# Patient Record
Sex: Female | Born: 1980
Health system: Southern US, Community
[De-identification: ages and names within clinical notes are randomized; demographics above are authoritative.]

## PROBLEM LIST (undated history)

## (undated) DIAGNOSIS — M5030 Other cervical disc degeneration, unspecified cervical region: Secondary | ICD-10-CM

## (undated) DIAGNOSIS — G43909 Migraine, unspecified, not intractable, without status migrainosus: Secondary | ICD-10-CM

## (undated) DIAGNOSIS — F4310 Post-traumatic stress disorder, unspecified: Secondary | ICD-10-CM

## (undated) DIAGNOSIS — M19041 Primary osteoarthritis, right hand: Secondary | ICD-10-CM

## (undated) DIAGNOSIS — M19042 Primary osteoarthritis, left hand: Secondary | ICD-10-CM

## (undated) DIAGNOSIS — M069 Rheumatoid arthritis, unspecified: Secondary | ICD-10-CM

## (undated) DIAGNOSIS — F32A Depression, unspecified: Secondary | ICD-10-CM

## (undated) DIAGNOSIS — F319 Bipolar disorder, unspecified: Secondary | ICD-10-CM

## (undated) DIAGNOSIS — M19071 Primary osteoarthritis, right ankle and foot: Secondary | ICD-10-CM

## (undated) DIAGNOSIS — K219 Gastro-esophageal reflux disease without esophagitis: Secondary | ICD-10-CM

## (undated) DIAGNOSIS — M19072 Primary osteoarthritis, left ankle and foot: Secondary | ICD-10-CM

## (undated) DIAGNOSIS — I10 Essential (primary) hypertension: Secondary | ICD-10-CM

## (undated) DIAGNOSIS — F429 Obsessive-compulsive disorder, unspecified: Secondary | ICD-10-CM

## (undated) DIAGNOSIS — F419 Anxiety disorder, unspecified: Secondary | ICD-10-CM

## (undated) DIAGNOSIS — F329 Major depressive disorder, single episode, unspecified: Secondary | ICD-10-CM

## (undated) HISTORY — DX: Migraine, unspecified, not intractable, without status migrainosus: G43.909

## (undated) HISTORY — DX: Post-traumatic stress disorder, unspecified: F43.10

## (undated) HISTORY — DX: Rheumatoid arthritis, unspecified: M06.9

## (undated) HISTORY — PX: TUBAL LIGATION: SHX77

## (undated) HISTORY — DX: Primary osteoarthritis, left ankle and foot: M19.072

## (undated) HISTORY — DX: Other cervical disc degeneration, unspecified cervical region: M50.30

## (undated) HISTORY — DX: Essential (primary) hypertension: I10

## (undated) HISTORY — DX: Gastro-esophageal reflux disease without esophagitis: K21.9

## (undated) HISTORY — DX: Primary osteoarthritis, right hand: M19.041

## (undated) HISTORY — DX: Obsessive-compulsive disorder, unspecified: F42.9

## (undated) HISTORY — DX: Bipolar disorder, unspecified: F31.9

## (undated) HISTORY — DX: Primary osteoarthritis, left hand: M19.042

## (undated) HISTORY — DX: Primary osteoarthritis, right ankle and foot: M19.071

---

## 2010-03-15 ENCOUNTER — Emergency Department (HOSPITAL_COMMUNITY)
Admission: EM | Admit: 2010-03-15 | Discharge: 2010-03-15 | Payer: Self-pay | Source: Home / Self Care | Admitting: Emergency Medicine

## 2010-03-18 LAB — URINALYSIS, ROUTINE W REFLEX MICROSCOPIC
Bilirubin Urine: NEGATIVE
Ketones, ur: NEGATIVE mg/dL
Nitrite: NEGATIVE
Protein, ur: NEGATIVE mg/dL
Urobilinogen, UA: 0.2 mg/dL (ref 0.0–1.0)

## 2010-03-18 LAB — URINE MICROSCOPIC-ADD ON

## 2010-03-18 LAB — POCT PREGNANCY, URINE: Preg Test, Ur: NEGATIVE

## 2010-03-19 LAB — GC/CHLAMYDIA PROBE AMP, GENITAL: GC Probe Amp, Genital: NEGATIVE

## 2010-03-19 LAB — WET PREP, GENITAL
Trich, Wet Prep: NONE SEEN
Yeast Wet Prep HPF POC: NONE SEEN

## 2010-04-01 ENCOUNTER — Inpatient Hospital Stay (INDEPENDENT_AMBULATORY_CARE_PROVIDER_SITE_OTHER)
Admission: RE | Admit: 2010-04-01 | Discharge: 2010-04-01 | Disposition: A | Discharge status: Home / Self Care | Payer: Self-pay | Source: Ambulatory Visit | Attending: Emergency Medicine | Admitting: Emergency Medicine

## 2010-04-01 DIAGNOSIS — R51 Headache: Secondary | ICD-10-CM

## 2011-02-25 ENCOUNTER — Encounter: Payer: Self-pay | Admitting: *Deleted

## 2011-02-25 ENCOUNTER — Emergency Department (HOSPITAL_COMMUNITY)
Admission: EM | Admit: 2011-02-25 | Discharge: 2011-02-25 | Disposition: A | Discharge status: Home / Self Care | Payer: BC Managed Care – PPO | Source: Home / Self Care | Attending: Family Medicine | Admitting: Family Medicine

## 2011-02-25 ENCOUNTER — Emergency Department (INDEPENDENT_AMBULATORY_CARE_PROVIDER_SITE_OTHER): Payer: BC Managed Care – PPO

## 2011-02-25 DIAGNOSIS — S93409A Sprain of unspecified ligament of unspecified ankle, initial encounter: Secondary | ICD-10-CM

## 2011-02-25 DIAGNOSIS — S93401A Sprain of unspecified ligament of right ankle, initial encounter: Secondary | ICD-10-CM

## 2011-02-25 IMAGING — CR DG ANKLE COMPLETE 3+V*R*
3 series · 3 of 3 positions shown · non-contrast
Comparison: None.

CLINICAL DATA: Fell and injured right ankle.  Lateral pain.

RIGHT ANKLE - COMPLETE 3+ VIEW [DATE]:

[view not recorded (1 of 3)]
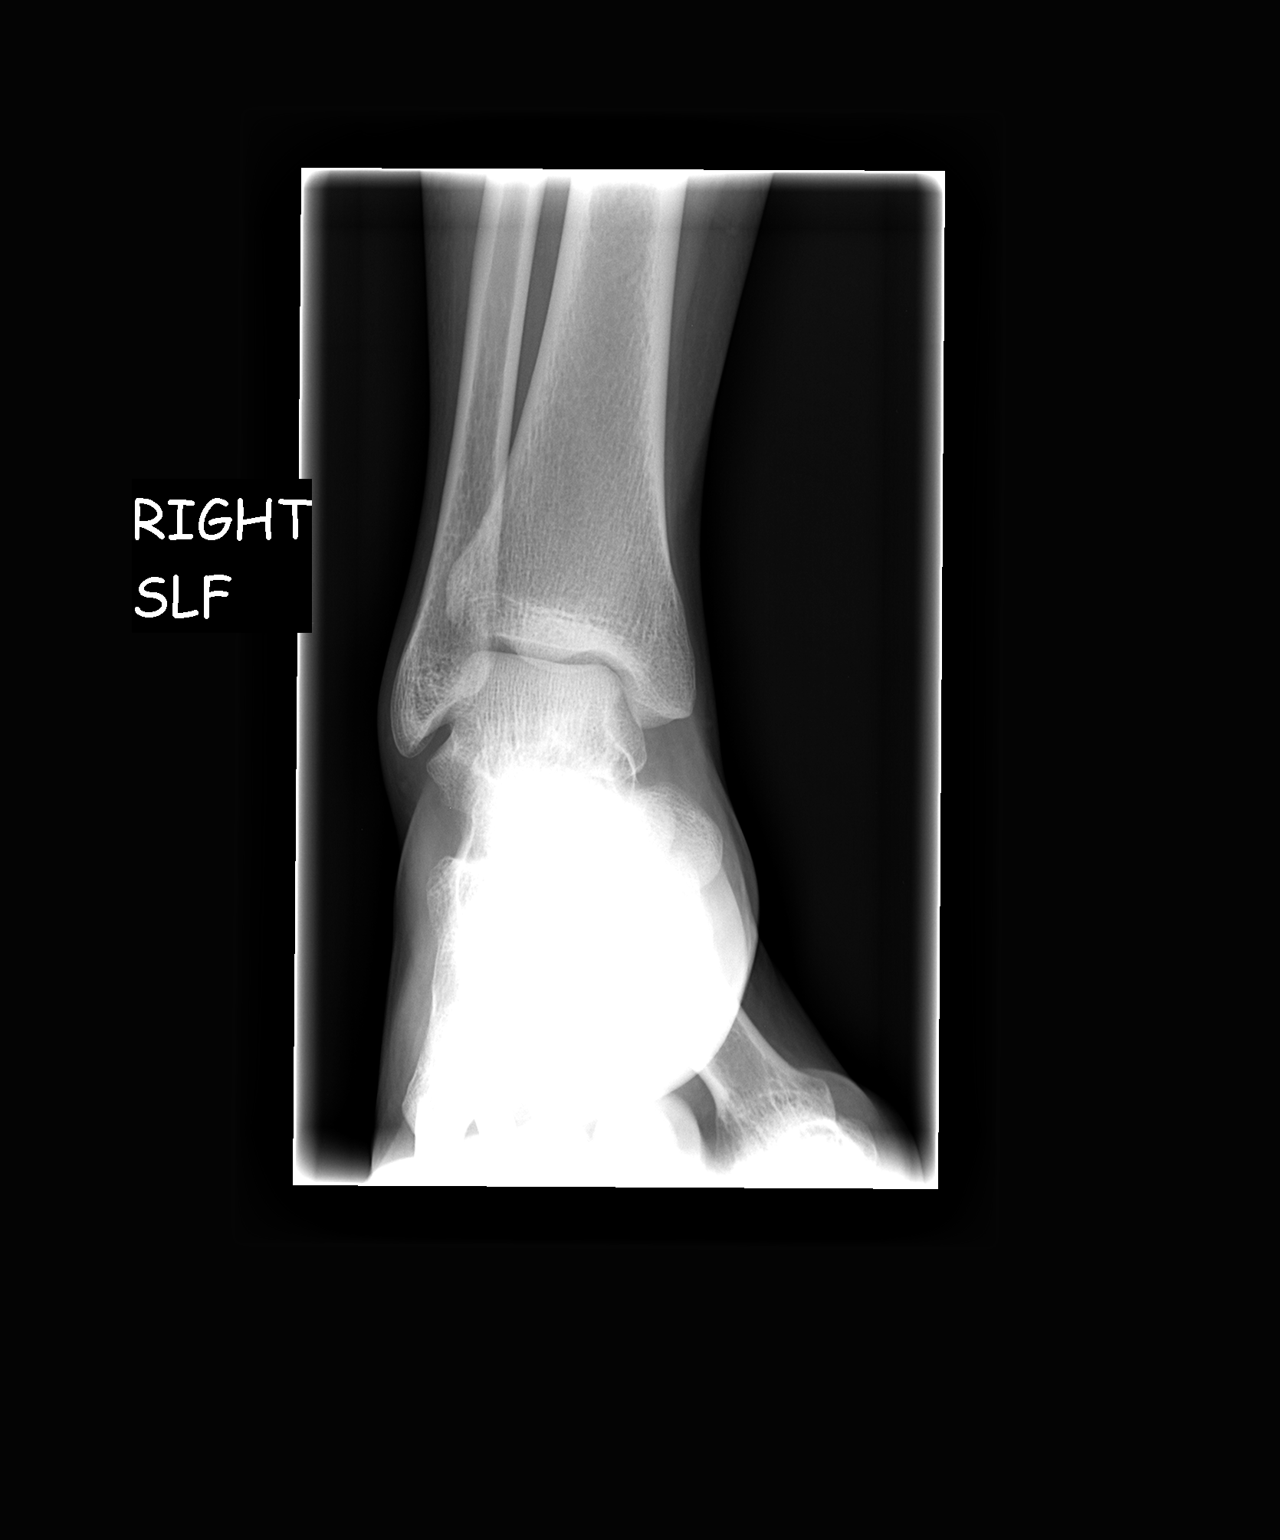

[view not recorded (2 of 3)]
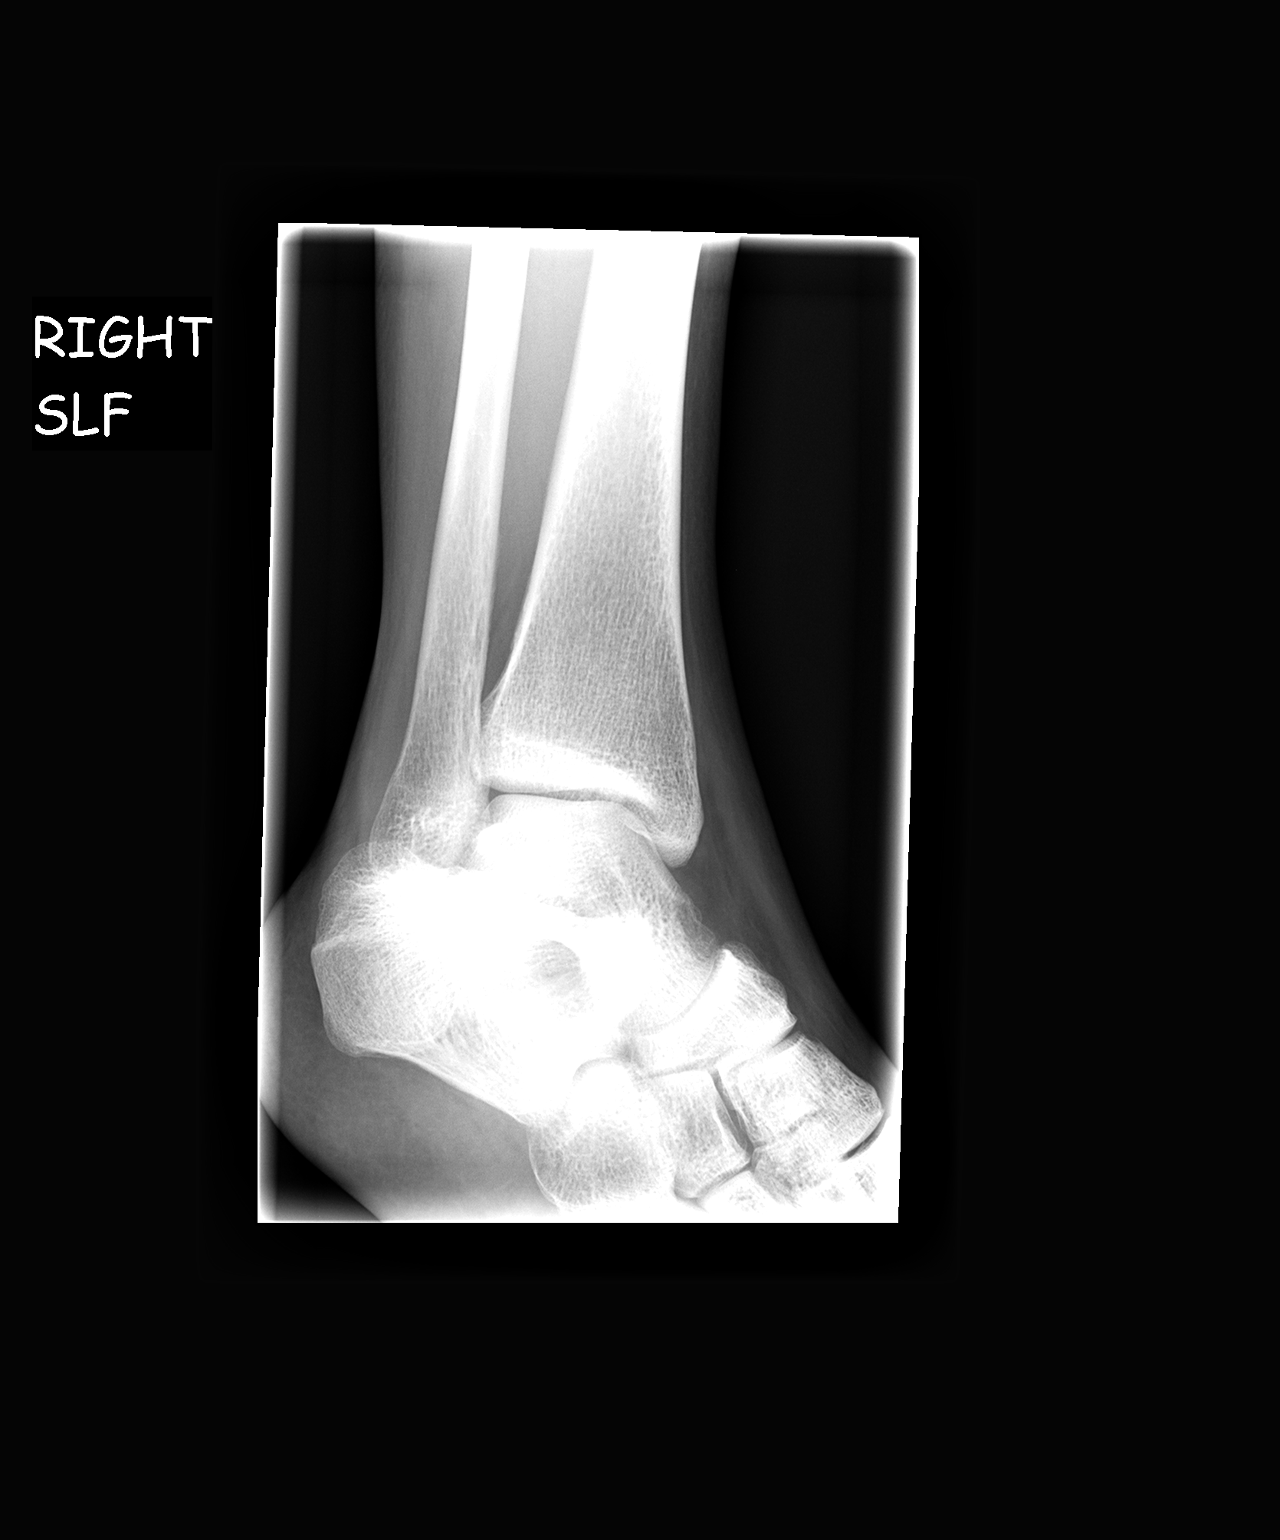

[view not recorded (3 of 3)]
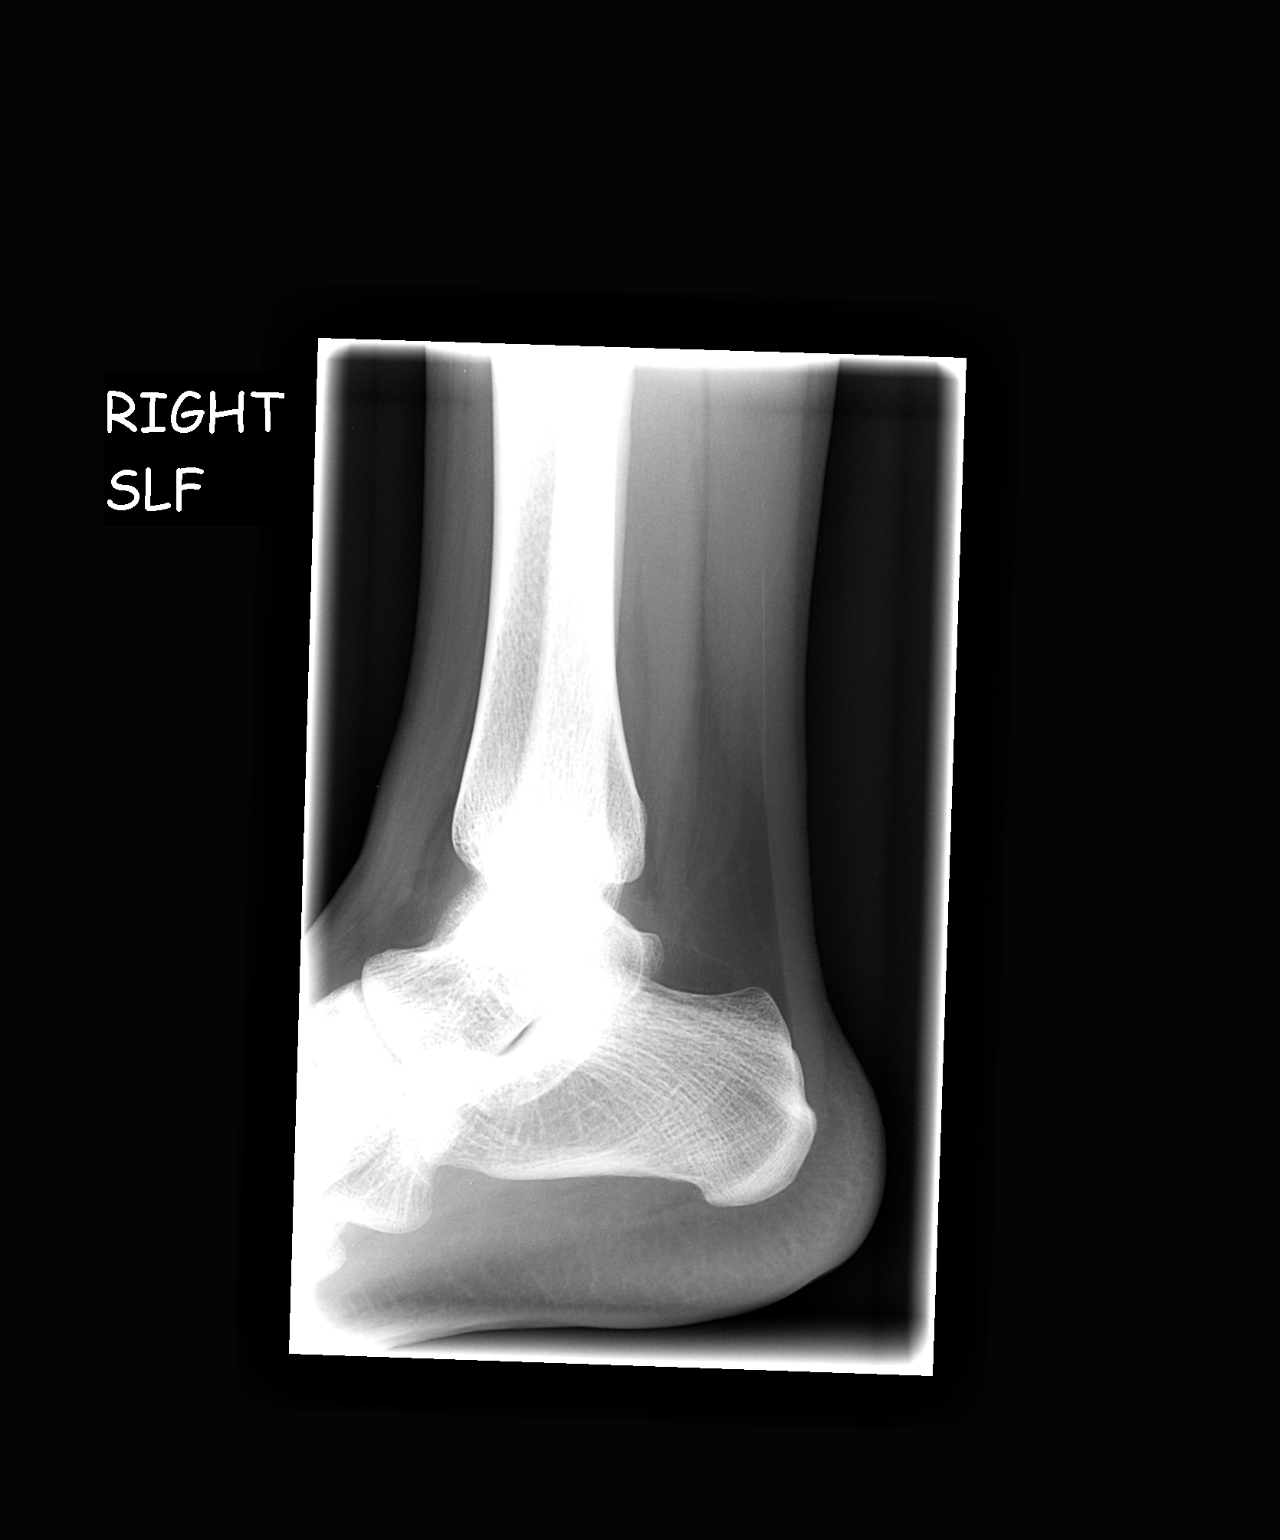

[3 of 3 positions shown; findings below may reference images not displayed]

FINDINGS: No evidence of acute fracture or dislocation.  Ankle
mortise intact with well-preserved joint space.  Well-preserved
bone mineral density.  No intrinsic osseous abnormality.  Small
joint effusion.
IMPRESSION: No osseous abnormality.

## 2011-02-25 MED ORDER — IBUPROFEN 800 MG PO TABS: 800.0000 mg | ORAL_TABLET | Freq: Three times a day (TID) | ORAL | Status: AC

## 2011-02-25 NOTE — ED Notes (Signed)
Injured right ankle last night fell - increased pain this am
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
## 2011-06-10 ENCOUNTER — Ambulatory Visit
Admission: RE | Admit: 2011-06-10 | Discharge: 2011-06-10 | Disposition: A | Discharge status: Home / Self Care | Payer: BC Managed Care – PPO | Source: Ambulatory Visit | Attending: Family Medicine | Admitting: Family Medicine

## 2011-06-10 ENCOUNTER — Other Ambulatory Visit: Payer: Self-pay | Admitting: Family Medicine

## 2011-06-10 DIAGNOSIS — R1011 Right upper quadrant pain: Secondary | ICD-10-CM

## 2011-06-10 IMAGING — US US ABDOMEN COMPLETE
1 series · 14 of 25 positions shown · non-contrast
Comparison: None.

CLINICAL DATA: Abdominal pain

ABDOMINAL ULTRASOUND COMPLETE

[Series 1: us abdomen complete · 0.23mm/px · 14 of 55 slices shown]
[im 1/55]
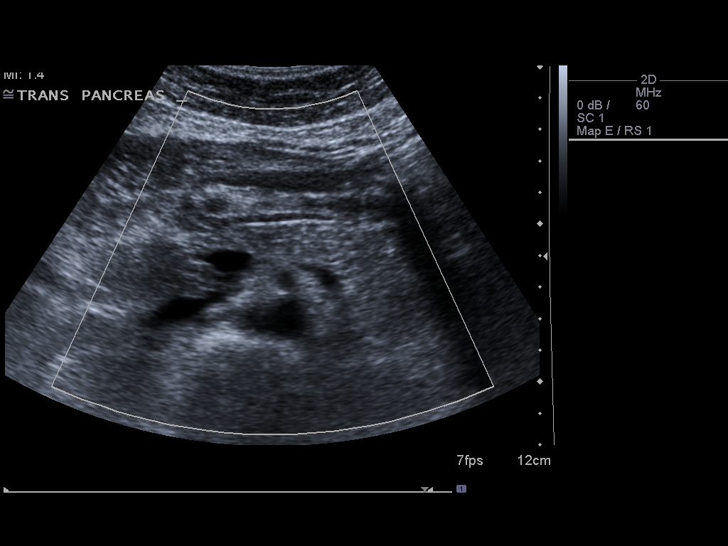
[im 5/55]
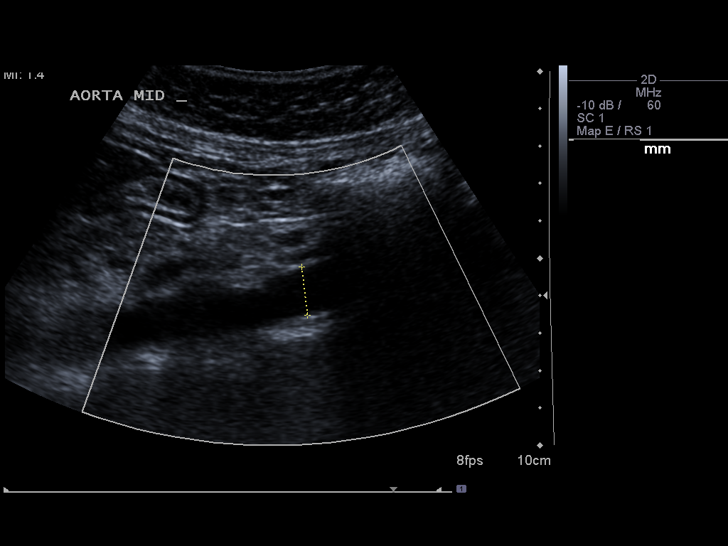
[im 10/55]
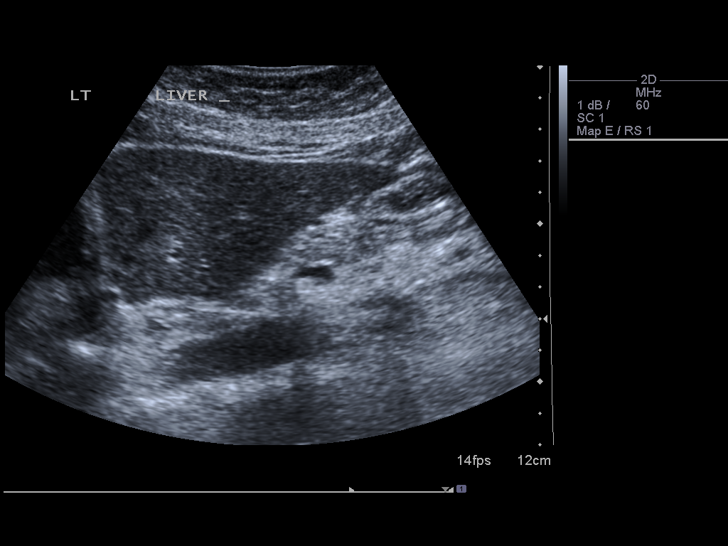
[im 14/55]
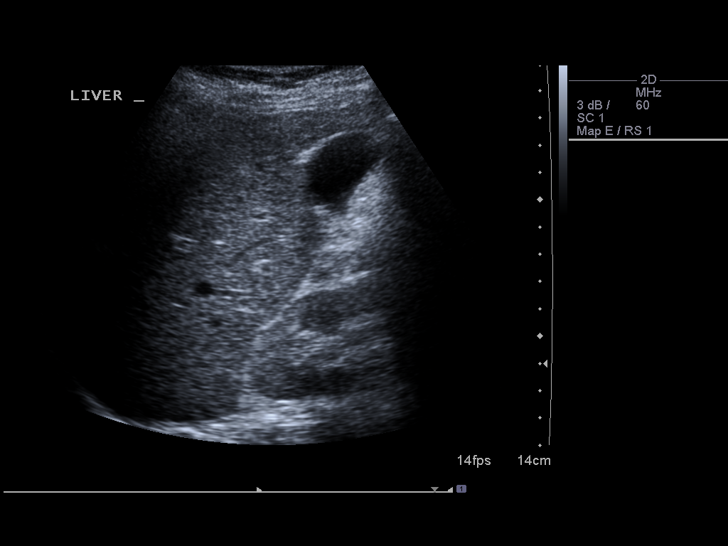
[im 19/55]
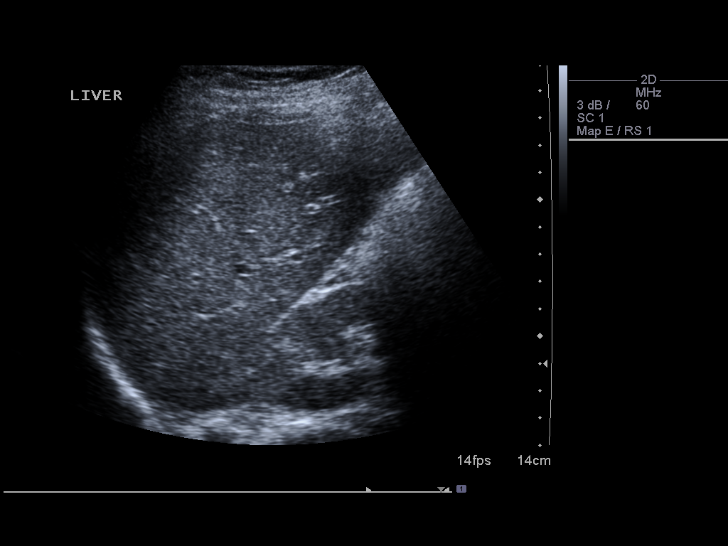
[im 21/55]
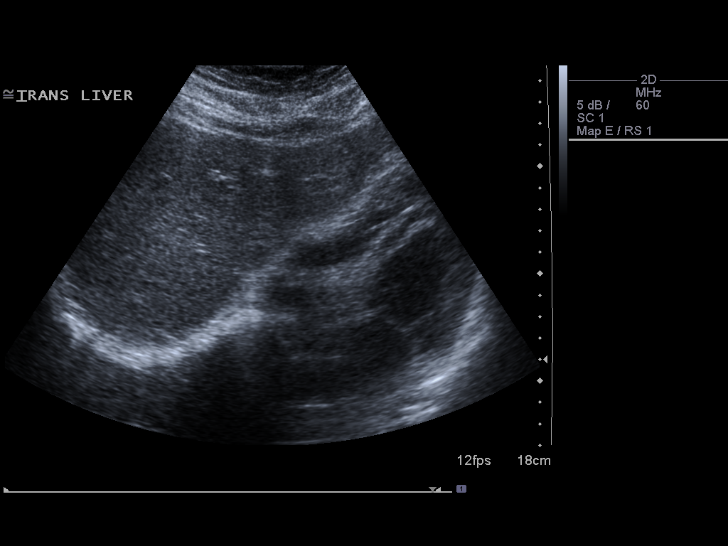
[im 25/55]
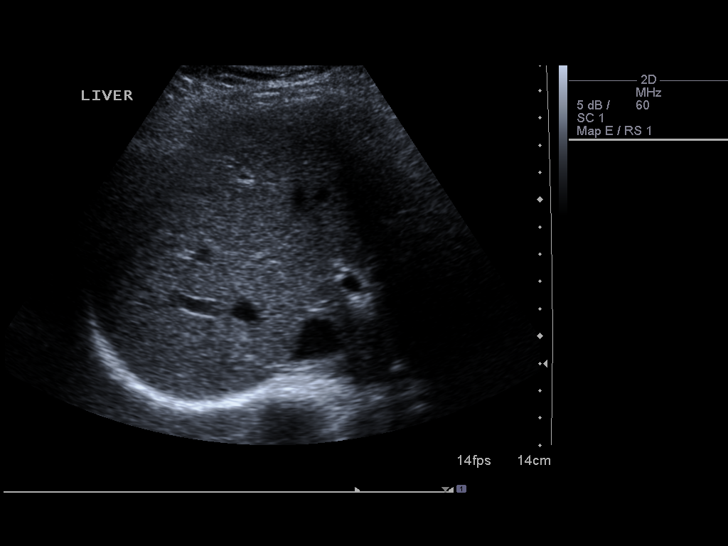
[im 30/55]
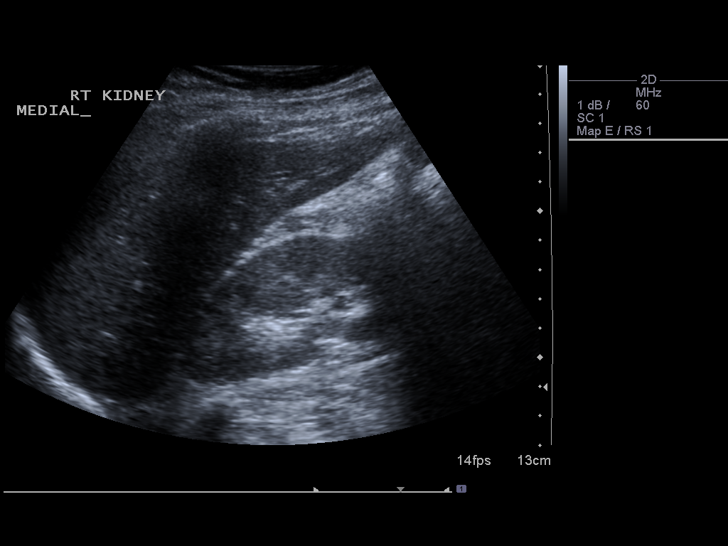
[im 34/55]
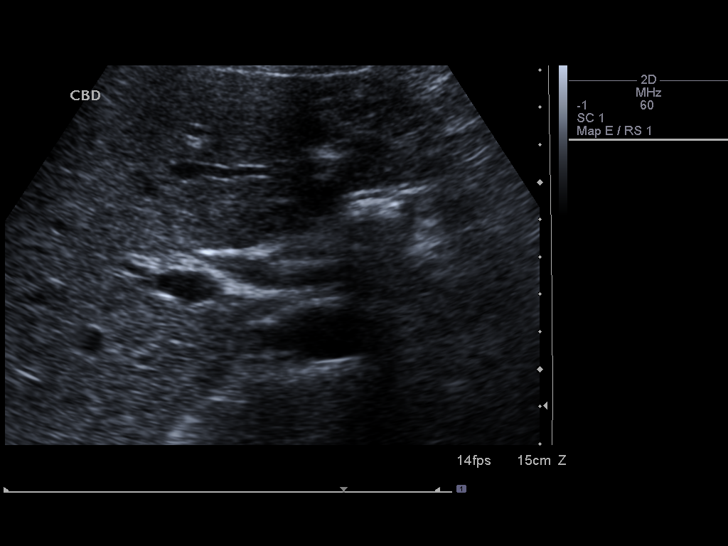
[im 37/55]
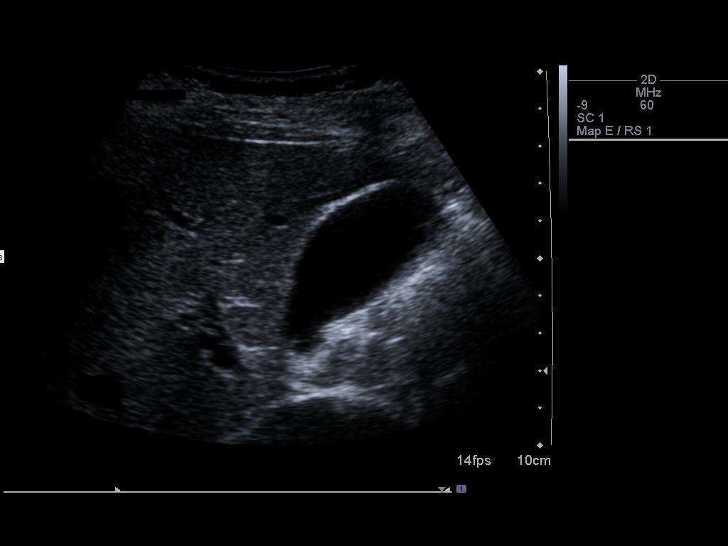
[im 41/55]
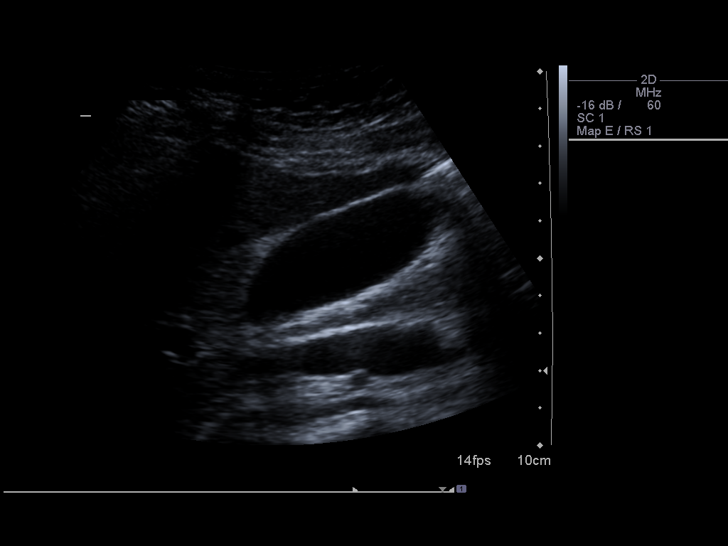
[im 46/55]
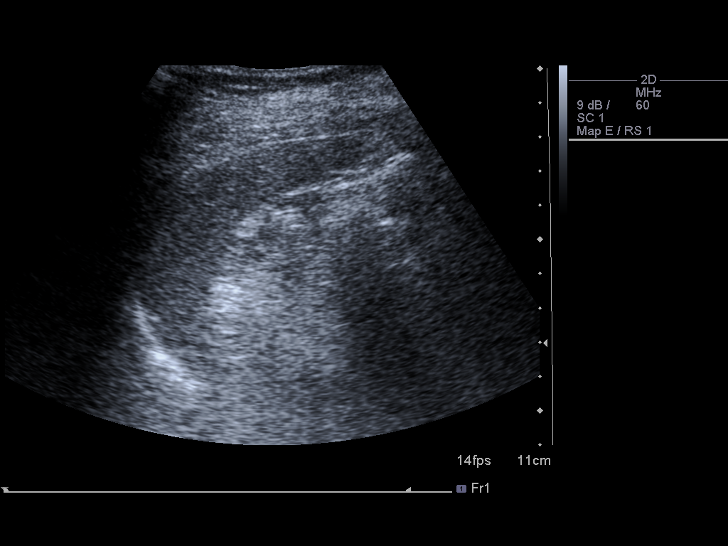
[im 50/55]
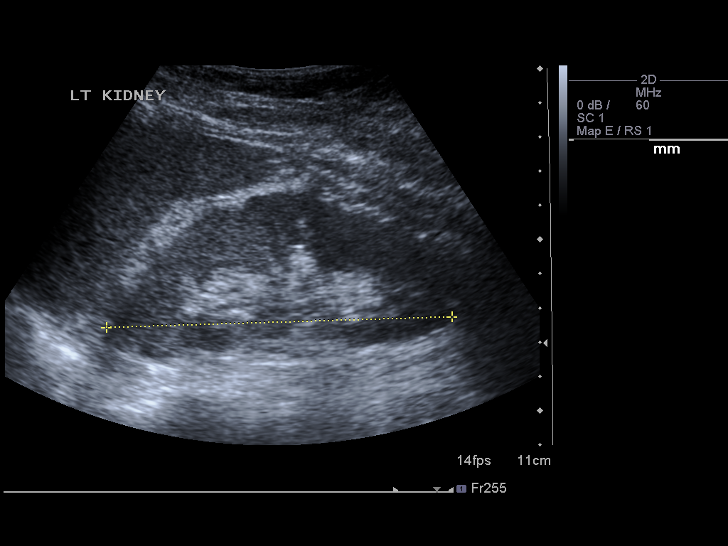
[im 55/55]
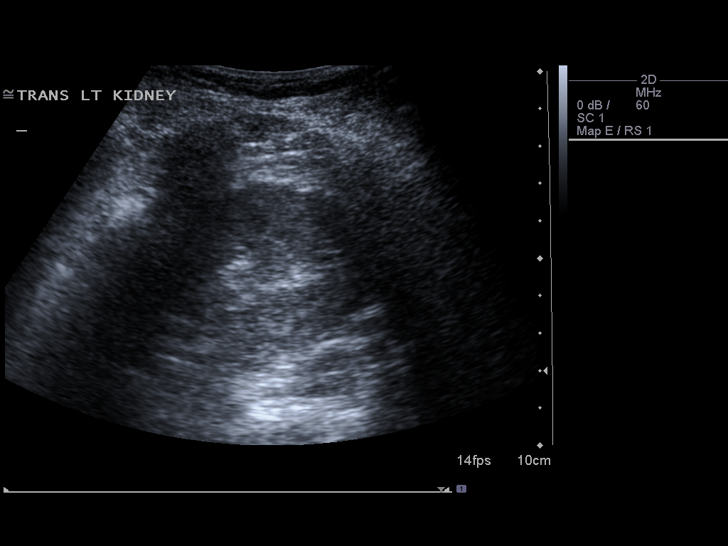

[14 of 25 positions shown; findings below may reference images not displayed]

FINDINGS: Gallbladder:  No gallstones, gallbladder wall thickening, or
pericholecystic fluid. Negative sonographic Murphy's sign.

Common Bile Duct:  Within normal limits in caliber. Measures
mm.

Liver: No focal mass lesion identified.  Within normal limits in
parenchymal echogenicity.

IVC:  Appears normal.

Pancreas:  No abnormality identified.

Spleen:  Within normal limits in size and echotexture.

Right kidney:  Normal in size and parenchymal echogenicity.  No
evidence of mass or hydronephrosis.

Left kidney:  Normal in size and parenchymal echogenicity.  No
evidence of mass or hydronephrosis.

Abdominal Aorta:  No aneurysm identified.
IMPRESSION: Negative abdominal ultrasound.

## 2011-06-11 ENCOUNTER — Other Ambulatory Visit: Payer: BC Managed Care – PPO

## 2011-06-22 ENCOUNTER — Encounter (HOSPITAL_COMMUNITY): Payer: Self-pay | Admitting: *Deleted

## 2011-06-22 ENCOUNTER — Emergency Department (HOSPITAL_COMMUNITY): Payer: BC Managed Care – PPO

## 2011-06-22 ENCOUNTER — Emergency Department (HOSPITAL_COMMUNITY)
Admission: EM | Admit: 2011-06-22 | Discharge: 2011-06-22 | Disposition: A | Discharge status: Home / Self Care | Payer: BC Managed Care – PPO | Attending: Emergency Medicine | Admitting: Emergency Medicine

## 2011-06-22 DIAGNOSIS — S0990XA Unspecified injury of head, initial encounter: Secondary | ICD-10-CM | POA: Insufficient documentation

## 2011-06-22 DIAGNOSIS — F17200 Nicotine dependence, unspecified, uncomplicated: Secondary | ICD-10-CM | POA: Insufficient documentation

## 2011-06-22 DIAGNOSIS — R51 Headache: Secondary | ICD-10-CM | POA: Insufficient documentation

## 2011-06-22 DIAGNOSIS — S0003XA Contusion of scalp, initial encounter: Secondary | ICD-10-CM | POA: Insufficient documentation

## 2011-06-22 DIAGNOSIS — S0083XA Contusion of other part of head, initial encounter: Secondary | ICD-10-CM
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
## 2011-06-22 MED ORDER — IBUPROFEN 400 MG PO TABS: 400.0000 mg | ORAL_TABLET | Freq: Three times a day (TID) | ORAL | Status: AC | PRN

## 2011-06-22 MED ORDER — DIPHENHYDRAMINE HCL 25 MG PO CAPS
25.0000 mg | ORAL_CAPSULE | Freq: Once | ORAL | Status: AC
  Administered 2011-06-22: 25 mg via ORAL
  Filled 2011-06-22: qty 1

## 2011-06-22 MED ORDER — OXYCODONE-ACETAMINOPHEN 5-325 MG PO TABS: 1.0000 | ORAL_TABLET | ORAL | Status: AC | PRN

## 2011-06-22 MED ORDER — IBUPROFEN 800 MG PO TABS
800.0000 mg | ORAL_TABLET | Freq: Once | ORAL | Status: AC
  Administered 2011-06-22: 800 mg via ORAL
  Filled 2011-06-22: qty 1

## 2011-06-22 MED ORDER — HYDROCODONE-ACETAMINOPHEN 5-325 MG PO TABS
1.0000 | ORAL_TABLET | Freq: Once | ORAL | Status: AC
  Administered 2011-06-22: 1 via ORAL
  Filled 2011-06-22: qty 1

## 2011-06-22 NOTE — ED Notes (Signed)
Patient currently sitting up in bed; no respiratory or acute distress noted.  Patient wanting to go home; informed patient that we are still waiting on x-ray results at this time.  Will continue to monitor.

## 2011-06-22 NOTE — ED Notes (Signed)
Patient given ice pack
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
## 2011-06-22 NOTE — ED Notes (Signed)
Katherine, FNP at bedside. 
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
## 2011-06-22 NOTE — ED Provider Notes (Signed)
Medical screening examination/treatment/procedure(s) were performed by non-physician practitioner and as supervising physician I was immediately available for consultation/collaboration.  Tashaun Obey M Tyia Binford, MD 06/22/11 0544 
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
## 2012-10-26 ENCOUNTER — Emergency Department (HOSPITAL_COMMUNITY)
Admission: EM | Admit: 2012-10-26 | Discharge: 2012-10-26 | Disposition: A | Discharge status: Home / Self Care | Payer: Self-pay | Source: Home / Self Care

## 2012-10-26 ENCOUNTER — Encounter (HOSPITAL_COMMUNITY): Payer: Self-pay | Admitting: Emergency Medicine

## 2012-10-26 DIAGNOSIS — M25512 Pain in left shoulder: Secondary | ICD-10-CM

## 2012-10-26 DIAGNOSIS — M25519 Pain in unspecified shoulder: Secondary | ICD-10-CM

## 2012-10-26 MED ORDER — TRAMADOL HCL 50 MG PO TABS: 50.0000 mg | ORAL_TABLET | Freq: Four times a day (QID) | ORAL | Status: DC | PRN

## 2012-10-26 MED ORDER — NAPROXEN 500 MG PO TABS: 500.0000 mg | ORAL_TABLET | Freq: Two times a day (BID) | ORAL | Status: DC

## 2012-10-26 NOTE — ED Notes (Signed)
C/o left arm pain that is sharp with tingling sensation. Denies injury. Limited ROM. Pt states that she has a hx of left shoulder problems.  Pt has used warm compresses and tylenol with no relief in pain. Onset Friday.
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
## 2012-10-28 NOTE — ED Provider Notes (Signed)
Medical screening examination/treatment/procedure(s) were performed by a resident physician or non-physician practitioner and as the supervising physician I was immediately available for consultation/collaboration.  Karma Hiney, MD   Makaveli Hoard S Gray Maugeri, MD 10/28/12 1026 

## 2012-12-17 ENCOUNTER — Emergency Department (INDEPENDENT_AMBULATORY_CARE_PROVIDER_SITE_OTHER)
Admission: EM | Admit: 2012-12-17 | Discharge: 2012-12-17 | Disposition: A | Discharge status: Home / Self Care | Payer: Self-pay | Source: Home / Self Care

## 2012-12-17 ENCOUNTER — Encounter (HOSPITAL_COMMUNITY): Payer: Self-pay | Admitting: Emergency Medicine

## 2012-12-17 DIAGNOSIS — J069 Acute upper respiratory infection, unspecified: Secondary | ICD-10-CM

## 2012-12-17 HISTORY — DX: Depression, unspecified: F32.A

## 2012-12-17 HISTORY — DX: Anxiety disorder, unspecified: F41.9

## 2012-12-17 HISTORY — DX: Major depressive disorder, single episode, unspecified: F32.9

## 2012-12-17 NOTE — ED Notes (Signed)
No work note

## 2012-12-17 NOTE — ED Provider Notes (Signed)
CSN: [id number]     Arrival date & time 12/17/12  4098 History   First MD Initiated Contact with Patient 12/17/12 1002     Chief Complaint  Patient presents with  . Nasal Congestion   (Consider location/radiation/quality/duration/timing/severity/associated sxs/prior Treatment) Patient is a 32 y.o. female presenting with URI. The history is provided by the patient.  URI Presenting symptoms: congestion and rhinorrhea   Presenting symptoms: no ear pain, no facial pain, no fatigue, no fever and no sore throat   Severity:  Mild Onset quality:  Gradual Duration:  3 days Timing:  Constant Progression:  Unchanged Chronicity:  New Relieved by:  Nothing Ineffective treatments:  OTC medications Associated symptoms: headaches, myalgias and sneezing   Associated symptoms: no arthralgias, no neck pain, no sinus pain, no swollen glands and no wheezing     Past Medical History  Diagnosis Date  . Migraine   . Depression   . Anxiety    No past surgical history on file. No family history on file. History  Substance Use Topics  . Smoking status: Current Every Day Smoker  . Smokeless tobacco: Not on file  . Alcohol Use: Yes   OB History   Grav Para Term Preterm Abortions TAB SAB Ect Mult Living                 Review of Systems  Constitutional: Negative for fever and fatigue.  HENT: Positive for congestion, rhinorrhea and sneezing. Negative for ear pain and sore throat.   Respiratory: Negative for wheezing.   Musculoskeletal: Positive for myalgias. Negative for arthralgias and neck pain.  Neurological: Positive for headaches.    Allergies  Vicodin  Home Medications   Current Outpatient Rx  Name  Route  Sig  Dispense  Refill  . naproxen (NAPROSYN) 500 MG tablet   Oral   Take 1 tablet (500 mg total) by mouth 2 (two) times daily.   60 tablet   0   . traMADol (ULTRAM) 50 MG tablet   Oral   Take 1 tablet (50 mg total) by mouth every 6 (six) hours as needed for pain.   30  tablet   0    BP 123/86  Pulse 66  Temp(Src) 98.3 F (36.8 C) (Oral)  Resp 18  SpO2 99%  LMP 12/11/2012 Physical Exam  Nursing note and vitals reviewed. Constitutional: She is oriented to person, place, and time. She appears well-developed and well-nourished. No distress.  HENT:  Right Ear: External ear normal.  Left Ear: External ear normal.  Mouth/Throat: Oropharynx is clear and moist. No oropharyngeal exudate.  Eyes: Conjunctivae and EOM are normal.  Neck: Normal range of motion. Neck supple.  Cardiovascular: Normal rate, regular rhythm and normal heart sounds.   Pulmonary/Chest: Effort normal and breath sounds normal. No respiratory distress. She has no wheezes. She has no rales.  Musculoskeletal: Normal range of motion. She exhibits no edema.  Lymphadenopathy:    She has no cervical adenopathy.  Neurological: She is alert and oriented to person, place, and time.  Skin: Skin is warm and dry. No rash noted.  Psychiatric: She has a normal mood and affect.    ED Course  Procedures (including critical care time) Labs Review Labs Reviewed - No data to display Imaging Review No results found.    MDM   1. URI (upper respiratory infection)      Alka-Seltzer plus cold medicine as directed Drink plenty of fluids Chicken soup Rest  Hayden Rasmussen, NP 12/17/12 1015

## 2012-12-17 NOTE — ED Notes (Signed)
Provided work note for patient.

## 2012-12-17 NOTE — ED Notes (Signed)
Congestion, body aches, headaches. Onset of symptoms was tuesday

## 2012-12-17 NOTE — ED Provider Notes (Signed)
Medical screening examination/treatment/procedure(s) were performed by a resident physician or non-physician practitioner and as the supervising physician I was immediately available for consultation/collaboration.  Tai Skelly, MD   Rinda Rollyson S Lyllie Cobbins, MD 12/17/12 1443 

## 2013-03-19 ENCOUNTER — Emergency Department (HOSPITAL_COMMUNITY)
Admission: EM | Admit: 2013-03-19 | Discharge: 2013-03-19 | Disposition: A | Discharge status: Home / Self Care | Payer: No Typology Code available for payment source | Attending: Emergency Medicine | Admitting: Emergency Medicine

## 2013-03-19 ENCOUNTER — Emergency Department (INDEPENDENT_AMBULATORY_CARE_PROVIDER_SITE_OTHER)
Admission: EM | Admit: 2013-03-19 | Discharge: 2013-03-19 | Disposition: A | Discharge status: Nursing Home (Includes ICF) | Payer: No Typology Code available for payment source | Source: Home / Self Care | Attending: Emergency Medicine | Admitting: Emergency Medicine

## 2013-03-19 ENCOUNTER — Emergency Department (HOSPITAL_COMMUNITY): Payer: No Typology Code available for payment source

## 2013-03-19 ENCOUNTER — Encounter (HOSPITAL_COMMUNITY): Payer: Self-pay | Admitting: Emergency Medicine

## 2013-03-19 DIAGNOSIS — F17200 Nicotine dependence, unspecified, uncomplicated: Secondary | ICD-10-CM | POA: Insufficient documentation

## 2013-03-19 DIAGNOSIS — G43909 Migraine, unspecified, not intractable, without status migrainosus: Secondary | ICD-10-CM | POA: Insufficient documentation

## 2013-03-19 DIAGNOSIS — Z3202 Encounter for pregnancy test, result negative: Secondary | ICD-10-CM | POA: Insufficient documentation

## 2013-03-19 DIAGNOSIS — Z8659 Personal history of other mental and behavioral disorders: Secondary | ICD-10-CM | POA: Insufficient documentation

## 2013-03-19 DIAGNOSIS — N23 Unspecified renal colic: Secondary | ICD-10-CM | POA: Insufficient documentation

## 2013-03-19 DIAGNOSIS — R1031 Right lower quadrant pain: Secondary | ICD-10-CM

## 2013-03-19 LAB — CBC WITH DIFFERENTIAL/PLATELET
Basophils Absolute: 0 10*3/uL (ref 0.0–0.1)
Basophils Relative: 0 % (ref 0–1)
EOS PCT: 1 % (ref 0–5)
Eosinophils Absolute: 0.1 10*3/uL (ref 0.0–0.7)
HEMATOCRIT: 35.4 % — AB (ref 36.0–46.0)
Hemoglobin: 13 g/dL (ref 12.0–15.0)
LYMPHS ABS: 1.6 10*3/uL (ref 0.7–4.0)
LYMPHS PCT: 23 % (ref 12–46)
MCH: 30.6 pg (ref 26.0–34.0)
MCHC: 36.7 g/dL — ABNORMAL HIGH (ref 30.0–36.0)
MCV: 83.3 fL (ref 78.0–100.0)
Monocytes Absolute: 0.4 10*3/uL (ref 0.1–1.0)
Monocytes Relative: 5 % (ref 3–12)
NEUTROS ABS: 5.1 10*3/uL (ref 1.7–7.7)
Neutrophils Relative %: 71 % (ref 43–77)
PLATELETS: 249 10*3/uL (ref 150–400)
RBC: 4.25 MIL/uL (ref 3.87–5.11)
RDW: 13.2 % (ref 11.5–15.5)
WBC: 7.1 10*3/uL (ref 4.0–10.5)

## 2013-03-19 LAB — COMPREHENSIVE METABOLIC PANEL
ALK PHOS: 49 U/L (ref 39–117)
ALT: 15 U/L (ref 0–35)
AST: 15 U/L (ref 0–37)
Albumin: 3.5 g/dL (ref 3.5–5.2)
BILIRUBIN TOTAL: 0.2 mg/dL — AB (ref 0.3–1.2)
BUN: 16 mg/dL (ref 6–23)
CALCIUM: 8.7 mg/dL (ref 8.4–10.5)
CHLORIDE: 109 meq/L (ref 96–112)
CO2: 20 meq/L (ref 19–32)
Creatinine, Ser: 0.9 mg/dL (ref 0.50–1.10)
GFR, EST NON AFRICAN AMERICAN: 83 mL/min — AB (ref >90)
GLUCOSE: 100 mg/dL — AB (ref 70–99)
Potassium: 4.4 mEq/L (ref 3.7–5.3)
SODIUM: 142 meq/L (ref 137–147)
Total Protein: 7.5 g/dL (ref 6.0–8.3)

## 2013-03-19 LAB — URINALYSIS, ROUTINE W REFLEX MICROSCOPIC
BILIRUBIN URINE: NEGATIVE
Glucose, UA: NEGATIVE mg/dL
KETONES UR: NEGATIVE mg/dL
Leukocytes, UA: NEGATIVE
NITRITE: NEGATIVE
PROTEIN: NEGATIVE mg/dL
SPECIFIC GRAVITY, URINE: 1.028 (ref 1.005–1.030)
UROBILINOGEN UA: 0.2 mg/dL (ref 0.0–1.0)
pH: 5 (ref 5.0–8.0)

## 2013-03-19 LAB — URINE MICROSCOPIC-ADD ON

## 2013-03-19 LAB — POCT PREGNANCY, URINE: Preg Test, Ur: NEGATIVE

## 2013-03-19 IMAGING — CT CT ABD-PELV W/O CM
2 of 4 series · 16 of 46 positions shown, 18 images · non-contrast
Comparison: Ultrasound [DATE].

CLINICAL DATA: Right flank pain.

EXAM:
CT ABDOMEN AND PELVIS WITHOUT CONTRAST
TECHNIQUE: Multidetector CT imaging of the abdomen and pelvis was performed
following the standard protocol without intravenous contrast.

[Series 2: stone study 5.0 i30f 1 · axial · 0.78mm/px · z∈[-351,+39]mm · 13 of 86 slices shown, 15 images]
[im 4/86  soft-tissue]
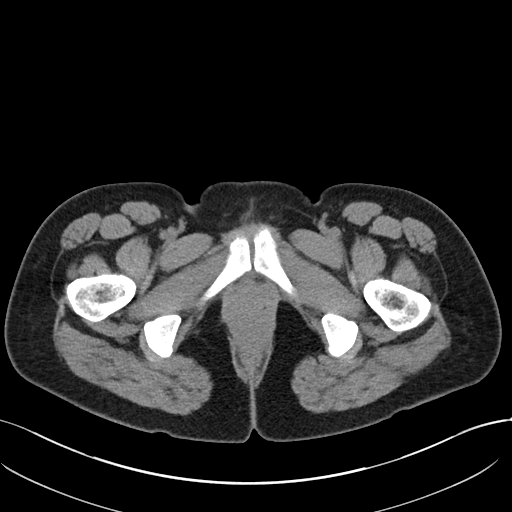
[im 4/86  bone]
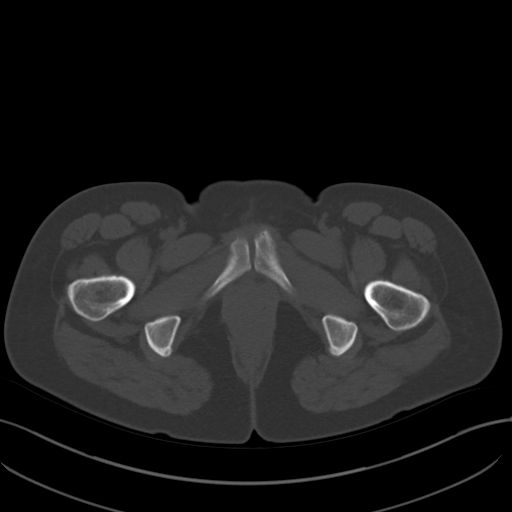
[im 11/86  soft-tissue]
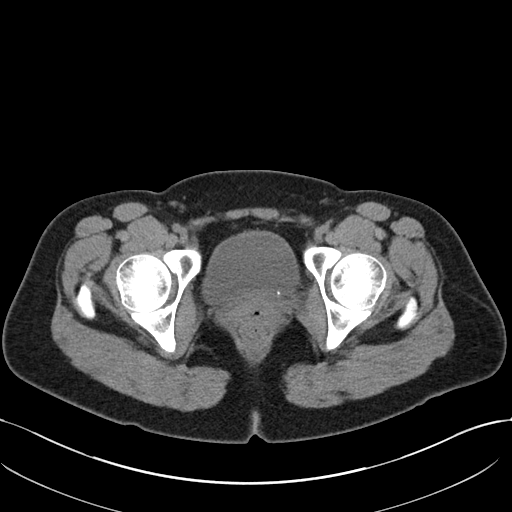
[im 18/86  soft-tissue]
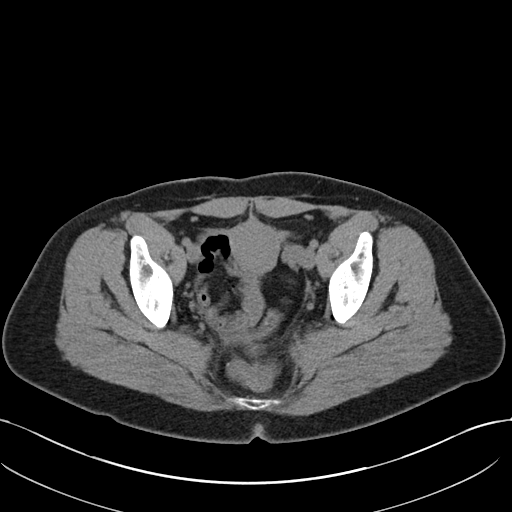
[im 24/86  soft-tissue]
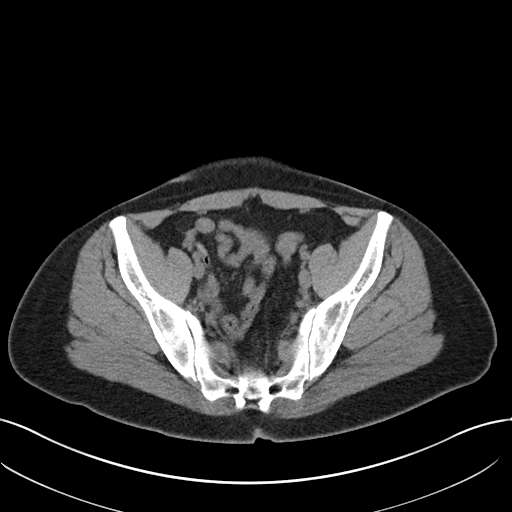
[im 31/86  soft-tissue]
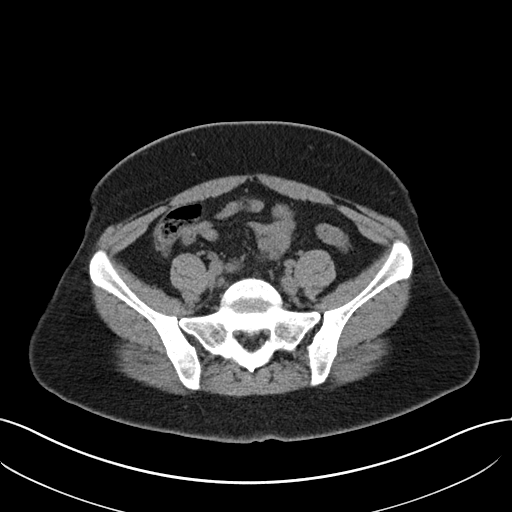
[im 38/86  soft-tissue]
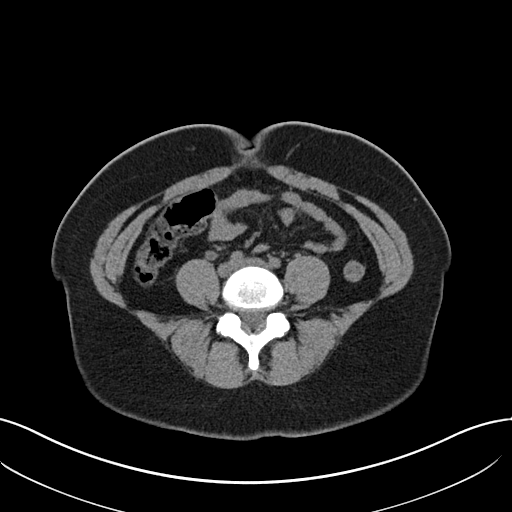
[im 45/86  soft-tissue]
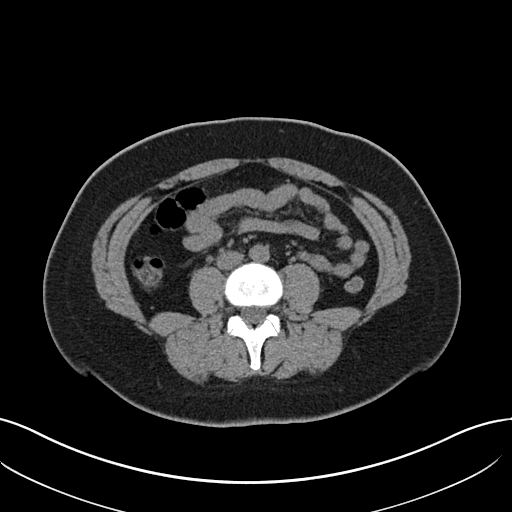
[im 48/86  soft-tissue]
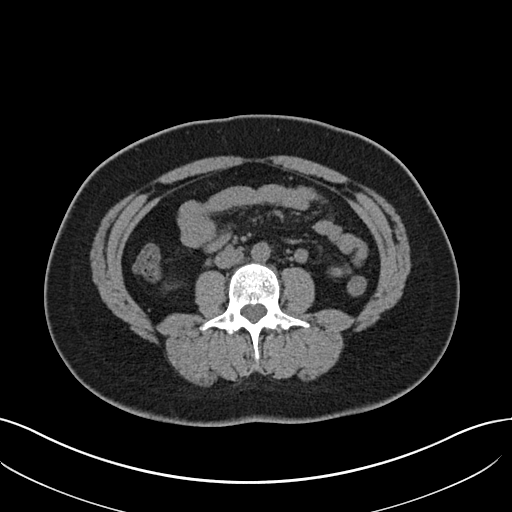
[im 55/86  soft-tissue]
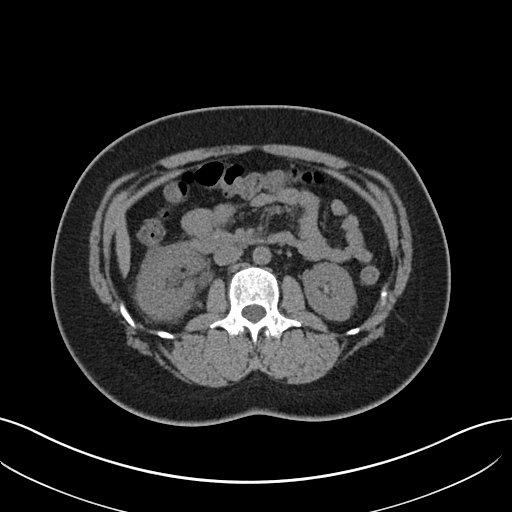
[im 55/86  bone]
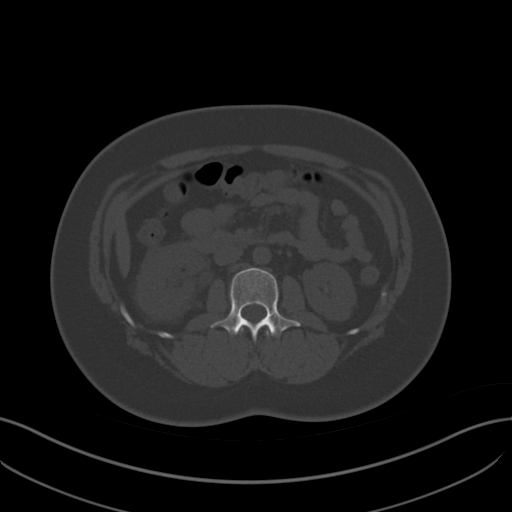
[im 62/86  soft-tissue]
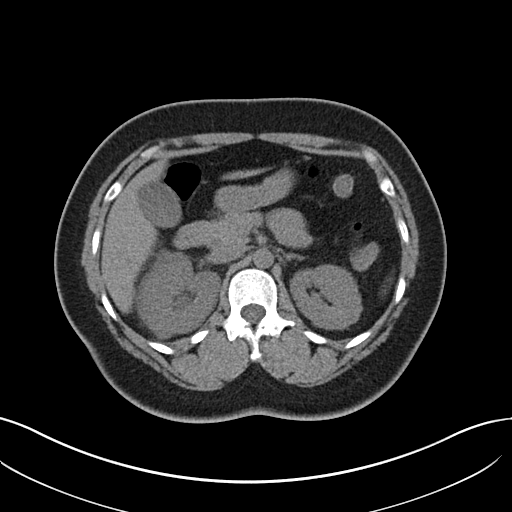
[im 69/86  soft-tissue]
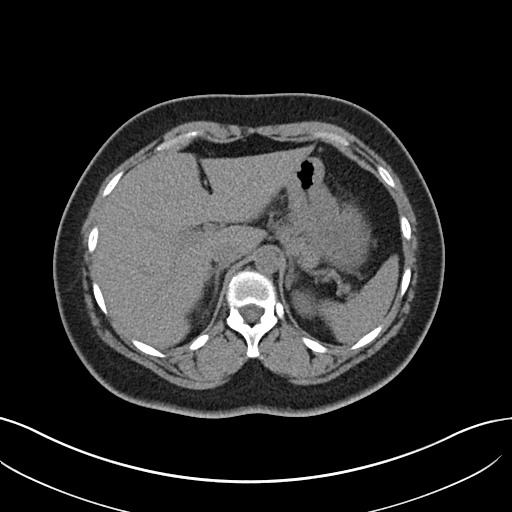
[im 75/86  soft-tissue]
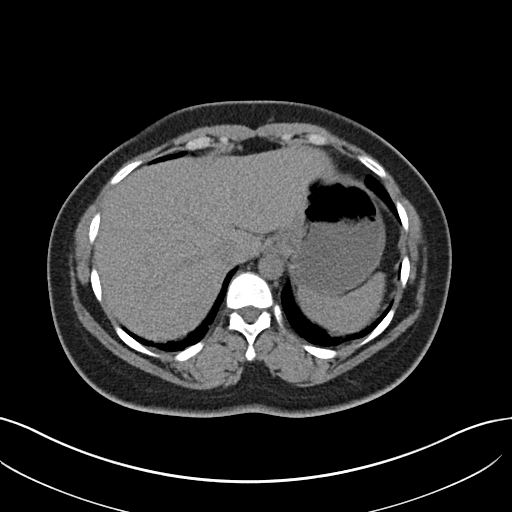
[im 82/86  soft-tissue]
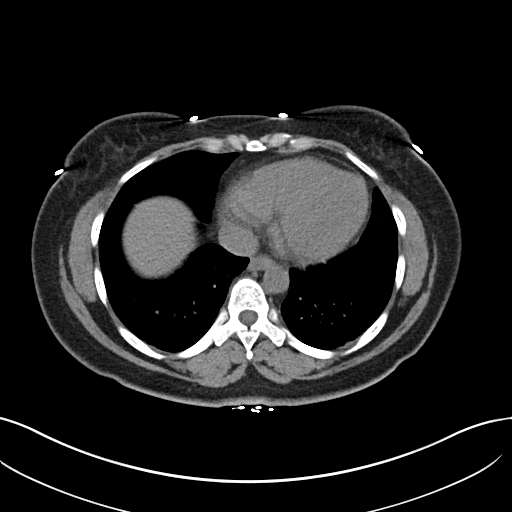

[Series 5: coronal soft tissue · coronal · 0.77mm/px · 3 of 76 slices shown]
[im 26/76  soft-tissue]
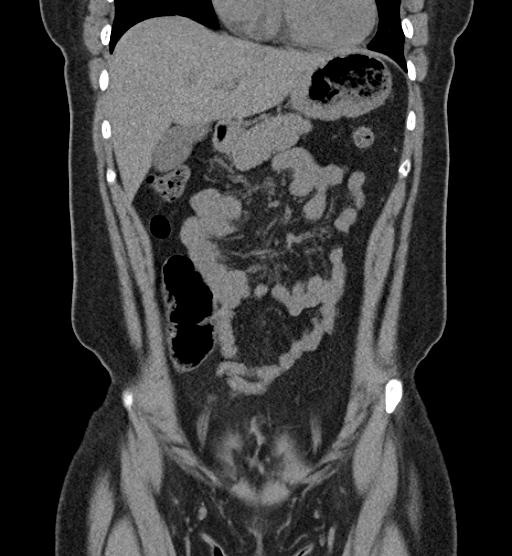
[im 34/76  soft-tissue]
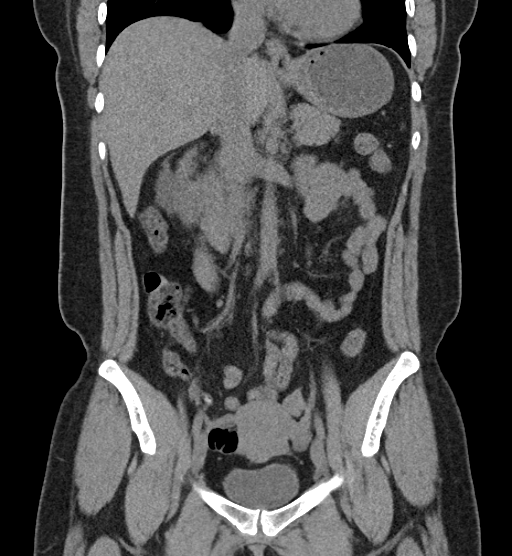
[im 42/76  soft-tissue]
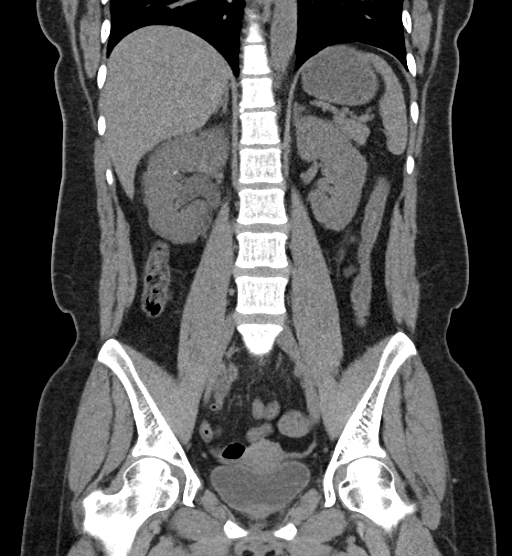

[16 of 46 positions shown; findings below may reference images not displayed]

FINDINGS: Liver normal. Spleen normal. Pancreas normal. No biliary distention.
Gallbladder nondistended.

Adrenals normal. Mild right hydronephrosis and hydroureter noted to
the level of the bladder. 2 mm calcification is noted in the
bladder. However this is not at the level of the right
ureterovesical junction, but at the left posterior portion of the
bladder. This could represent a recently passed stone from the right
that is mobile within the bladder. There is no hydronephrosis or
hydroureter on the left. The right kidney is swollen with mild
perinephric streaking. Pyelonephritis could present in this fashion.
Punctate multiple right renal calyceal stones are present.
Nonobstructing left renal punctate caliceal stones are present.
Again no left hydronephrosis. The bladder is nondistended. The
uterus and adnexa normal. No free pelvic fluid.

No significant adenopathy.  Aorta normal in caliber.

Appendix normal. No inflammatory abnormalities in the right or left
lower quadrants. No bowel distention. No free air. Tiny umbilical
hernia, with herniation of fat only.

Mild basilar atelectasis. Heart size normal. No acute bony
abnormality.
IMPRESSION: 1. Right hydronephrosis and hydroureter to the level of the bladder.
There is a 2 mm stone in the bladder. However this is not at the
right ureterovesical junction/ right portion of the bladder, but
along the left posterior portion of the bladder. This could
represent a recently passed stone from the right that is mobile
within the bladder. No hydronephrosis or hydroureter on the left.
The right kidney is swollen with perinephric streaking. This
suggests possibility of pyelonephritis.
2. Bilateral punctate renal calyceal stones.

## 2013-03-19 MED ORDER — IBUPROFEN 600 MG PO TABS: 600.0000 mg | ORAL_TABLET | Freq: Four times a day (QID) | ORAL | Status: DC | PRN

## 2013-03-19 MED ORDER — HYDROMORPHONE HCL PF 1 MG/ML IJ SOLN
1.0000 mg | Freq: Once | INTRAMUSCULAR | Status: AC
  Administered 2013-03-19: 1 mg via INTRAVENOUS
  Filled 2013-03-19: qty 1

## 2013-03-19 MED ORDER — HYDROMORPHONE HCL PF 1 MG/ML IJ SOLN
INTRAMUSCULAR | Status: AC
  Filled 2013-03-19: qty 2

## 2013-03-19 MED ORDER — OXYCODONE-ACETAMINOPHEN 5-325 MG PO TABS: 1.0000 | ORAL_TABLET | Freq: Four times a day (QID) | ORAL | Status: DC | PRN

## 2013-03-19 MED ORDER — ONDANSETRON HCL 4 MG/2ML IJ SOLN
INTRAMUSCULAR | Status: AC
  Filled 2013-03-19: qty 2

## 2013-03-19 MED ORDER — HYDROMORPHONE HCL PF 1 MG/ML IJ SOLN
2.0000 mg | Freq: Once | INTRAMUSCULAR | Status: AC
  Administered 2013-03-19: 2 mg via INTRAVENOUS

## 2013-03-19 MED ORDER — ONDANSETRON HCL 4 MG/2ML IJ SOLN
4.0000 mg | Freq: Once | INTRAMUSCULAR | Status: AC
  Administered 2013-03-19: 4 mg via INTRAVENOUS

## 2013-03-19 MED ORDER — SODIUM CHLORIDE 0.9 % IV SOLN
INTRAVENOUS | Status: DC
  Administered 2013-03-19: 10:00:00 via INTRAVENOUS

## 2013-03-19 MED ORDER — ONDANSETRON HCL 4 MG/2ML IJ SOLN
4.0000 mg | Freq: Once | INTRAMUSCULAR | Status: AC
  Administered 2013-03-19: 4 mg via INTRAVENOUS
  Filled 2013-03-19: qty 2

## 2013-03-19 MED ORDER — SODIUM CHLORIDE 0.9 % IV BOLUS (SEPSIS)
1000.0000 mL | Freq: Once | INTRAVENOUS | Status: AC
  Administered 2013-03-19: 1000 mL via INTRAVENOUS

## 2013-03-19 NOTE — ED Notes (Signed)
Received pt from urgent care with c/o right flank pain that radiates to Right lower quadrant with n/v. Pt given 2mg  of Dilaudid and 4mg  of Zofran at urgent care. Pt reported to [facility] that she has history of UTI in past with similar symptoms.

## 2013-03-19 NOTE — Discharge Instructions (Signed)
We have determined that your problem requires further evaluation in the emergency department.  We will take care of your transport there.  Once at the emergency department, you will be evaluated by a provider and they will order whatever treatment or tests they deem necessary.  We cannot guarantee that they will do any specific test or do any specific treatment.  ° °

## 2013-03-19 NOTE — ED Provider Notes (Signed)
Medical screening examination/treatment/procedure(s) were performed by non-physician practitioner and as supervising physician I was immediately available for consultation/collaboration.  EKG Interpretation   None         Ezequiel Essex, MD 03/19/13 1517

## 2013-03-19 NOTE — ED Provider Notes (Signed)
CSN: [id number]     Arrival date & time 03/19/13  1037 History   First MD Initiated Contact with Patient 03/19/13 1038     Chief Complaint  Patient presents with  . Flank Pain   (Consider location/radiation/quality/duration/timing/severity/associated sxs/prior Treatment) HPI Comments: Patient with no significant past surgical history presents with complaint of right flank and lower quadrant pain that began acutely, awaking her from sleep, at 3 AM today. Patient describes the pain as sharp. Pain radiates to right groin and suprapubic area. Patient denies fever, nausea, vomiting, constipation, diarrhea, hematuria, urgency or dysuria. Patient's menstrual period ended 2 days ago. Period was heavier than normal. History of tubal ligation. Patient given IV pain medication prior at [facility] prior to transfer to ED. No h/o cyst. History of UTI in past with similar pain but less severe.   Patient is a 33 y.o. female presenting with flank pain. The history is provided by the patient.  Flank Pain Associated symptoms include abdominal pain. Pertinent negatives include no chest pain, coughing, fever, headaches, myalgias, nausea, rash, sore throat or vomiting.    Past Medical History  Diagnosis Date  . Migraine   . Depression   . Anxiety    History reviewed. No pertinent past surgical history. No family history on file. History  Substance Use Topics  . Smoking status: Current Every Day Smoker  . Smokeless tobacco: Not on file  . Alcohol Use: Yes   OB History   Grav Para Term Preterm Abortions TAB SAB Ect Mult Living                 Review of Systems  Constitutional: Negative for fever.  HENT: Negative for rhinorrhea and sore throat.   Eyes: Negative for redness.  Respiratory: Negative for cough.   Cardiovascular: Negative for chest pain.  Gastrointestinal: Positive for abdominal pain. Negative for nausea, vomiting and diarrhea.  Genitourinary: Positive for flank pain and pelvic pain. Negative  for dysuria, decreased urine volume, vaginal bleeding and vaginal discharge.  Musculoskeletal: Negative for myalgias.  Skin: Negative for rash.  Neurological: Negative for headaches.    Allergies  Vicodin  Home Medications   Current Outpatient Rx  Name  Route  Sig  Dispense  Refill  . aspirin-acetaminophen-caffeine (EXCEDRIN MIGRAINE) 250-250-65 MG per tablet   Oral   Take 1 tablet by mouth every 6 (six) hours as needed for headache.          BP 146/104  Pulse 86  Temp(Src) 98 F (36.7 C) (Oral)  Resp 22  Ht 5\' 2"  (1.575 m)  Wt 157 lb (71.215 kg)  BMI 28.71 kg/m2  SpO2 100%  LMP 03/17/2013 Physical Exam  Nursing note and vitals reviewed. Constitutional: She appears well-developed and well-nourished.  HENT:  Head: Normocephalic and atraumatic.  Eyes: Conjunctivae are normal. Right eye exhibits no discharge. Left eye exhibits no discharge.  Neck: Normal range of motion. Neck supple.  Cardiovascular: Normal rate, regular rhythm and normal heart sounds.   Pulmonary/Chest: Effort normal and breath sounds normal.  Abdominal: Soft. There is tenderness in the right lower quadrant and suprapubic area. There is guarding (voluntary) and CVA tenderness (mild left). There is no rigidity, no rebound and negative Murphy's sign.  Neurological: She is alert.  Skin: Skin is warm and dry.  Psychiatric: She has a normal mood and affect.    ED Course  Procedures (including critical care time) Labs Review Labs Reviewed  CBC WITH DIFFERENTIAL - Abnormal; Notable for the following:  HCT 35.4 (*)    MCHC 36.7 (*)    All other components within normal limits  COMPREHENSIVE METABOLIC PANEL - Abnormal; Notable for the following:    Glucose, Bld 100 (*)    Total Bilirubin 0.2 (*)    GFR calc non Af Amer 83 (*)    All other components within normal limits  URINALYSIS, ROUTINE W REFLEX MICROSCOPIC - Abnormal; Notable for the following:    APPearance CLOUDY (*)    Hgb urine dipstick  LARGE (*)    All other components within normal limits  URINE MICROSCOPIC-ADD ON - Abnormal; Notable for the following:    Squamous Epithelial / LPF FEW (*)    Bacteria, UA FEW (*)    All other components within normal limits  POCT PREGNANCY, URINE   Imaging Review Ct Abdomen Pelvis Wo Contrast  03/19/2013   CLINICAL DATA:  Right flank pain.  EXAM: CT ABDOMEN AND PELVIS WITHOUT CONTRAST  TECHNIQUE: Multidetector CT imaging of the abdomen and pelvis was performed following the standard protocol without intravenous contrast.  COMPARISON:  Ultrasound 06/10/2011.  FINDINGS: Liver normal. Spleen normal. Pancreas normal. No biliary distention. Gallbladder nondistended.  Adrenals normal. Mild right hydronephrosis and hydroureter noted to the level of the bladder. 2 mm calcification is noted in the bladder. However this is not at the level of the right ureterovesical junction, but at the left posterior portion of the bladder. This could represent a recently passed stone from the right that is mobile within the bladder. There is no hydronephrosis or hydroureter on the left. The right kidney is swollen with mild perinephric streaking. Pyelonephritis could present in this fashion. Punctate multiple right renal calyceal stones are present. Nonobstructing left renal punctate caliceal stones are present. Again no left hydronephrosis. The bladder is nondistended. The uterus and adnexa normal. No free pelvic fluid.  No significant adenopathy.  Aorta normal in caliber.  Appendix normal. No inflammatory abnormalities in the right or left lower quadrants. No bowel distention. No free air. Tiny umbilical hernia, with herniation of fat only.  Mild basilar atelectasis. Heart size normal. No acute bony abnormality.  IMPRESSION: 1. Right hydronephrosis and hydroureter to the level of the bladder. There is a 2 mm stone in the bladder. However this is not at the right ureterovesical junction/ right portion of the bladder, but along  the left posterior portion of the bladder. This could represent a recently passed stone from the right that is mobile within the bladder. No hydronephrosis or hydroureter on the left. The right kidney is swollen with perinephric streaking. This suggests possibility of pyelonephritis. 2. Bilateral punctate renal calyceal stones.   Electronically Signed   By: Marcello Moores  Register   On: 03/19/2013 13:03    EKG Interpretation   None      10:59 AM Patient seen and examined. Work-up initiated. Medications ordered. Clinically she appears to have renal colic, however appendicitis must be considered. If this is negative, feel she will need ruled out for ovarian torsion as well. Will start with CT scan and proceed based on results.   Vital signs reviewed and are as follows: Filed Vitals:   03/19/13 1045  BP: 146/104  Pulse: 86  Temp: 98 F (36.7 C)  Resp: 22   CT shows hydronephrosis, stone in bladder.   Normal kidney function. Patient does not have clinical signs of pyelo on UA or exam. Do not suspect pyelo.   Patient counseled on kidney stone treatment. Urged patient to strain urine and  save any stones. Urged urology follow-up and return to [facility] with any complications. Counseled patient to maintain good fluid intake.   Patient counseled on use of narcotic pain medications. Counseled not to combine these medications with others containing tylenol. Urged not to drink alcohol, drive, or perform any other activities that requires focus while taking these medications. The patient verbalizes understanding and agrees with the plan.    MDM   1. Ureteral colic    Patient with R flank pain, CT suggests recently passed stone. Otherwise normal. Pain controlled. No concern for appendicitis. She appears well, non-toxic. Will give normal ureteral stone care and follow-up.     Carlisle Cater, PA-C 03/19/13 [phone]

## 2013-03-19 NOTE — ED Notes (Signed)
Report given to [place] and [place], Camera operator at [facility] ED

## 2013-03-19 NOTE — ED Notes (Signed)
Right arm shaking-- does not normally have tremors-- PA notified

## 2013-03-19 NOTE — Discharge Instructions (Signed)
Please read and follow all provided instructions.  Your diagnoses today include:  1. Ureteral colic     Tests performed today include:  Urine test that showed blood in your urine and no definite infection  CT scan which showed a 2 millimeter kidney in the bladder and a swollen L kidney  Blood test that showed normal kidney function  Vital signs. See below for your results today.   Medications prescribed:   Percocet (oxycodone/acetaminophen) - narcotic pain medication  DO NOT drive or perform any activities that require you to be awake and alert because this medicine can make you drowsy. BE VERY CAREFUL not to take multiple medicines containing Tylenol (also called acetaminophen). Doing so can lead to an overdose which can damage your liver and cause liver failure and possibly death.   Ibuprofen (Motrin, Advil) - anti-inflammatory pain medication  Do not exceed 600mg  ibuprofen every 6 hours, take with food  You have been prescribed an anti-inflammatory medication or NSAID. Take with food. Take smallest effective dose for the shortest duration needed for your pain. Stop taking if you experience stomach pain or vomiting.   Take any prescribed medications only as directed.  Home care instructions:  Follow any educational materials contained in this packet.  Please double your fluid intake for the next several days. Strain your urine and save any stones that may pass.   BE VERY CAREFUL not to take multiple medicines containing Tylenol (also called acetaminophen). Doing so can lead to an overdose which can damage your liver and cause liver failure and possibly death.   Follow-up instructions: Please follow-up with your urologist or the urologist referral (provided on front page) in the next 1 week for further evaluation of your symptoms.  If you need to return to the Emergency Department, go to [facility] and not [facility]. The urologists are located at [facility] and can better care for you at this location.  If you do not have a primary care doctor -- see below for referral information.   Return instructions:  If you need to return to the Emergency Department, go to [facility] and not [facility]. The urologists are located at [facility] and can better care for you at this location.   Please return to the Emergency Department if you experience worsening symptoms.  Please return if you develop fever or uncontrolled pain or vomiting.  Please return if you have any other emergent concerns.  Additional Information:  Your vital signs today were: BP 146/104   Pulse 86   Temp(Src) 98 F (36.7 C) (Oral)   Resp 22   Ht 5\' 2"  (1.575 m)   Wt 157 lb (71.215 kg)   BMI 28.71 kg/m2   SpO2 100%   LMP 03/17/2013 If your blood pressure (BP) was elevated above 135/85 this visit, please have this repeated by your doctor within one month. --------------  Emergency Department Resource Guide 1) Find a Doctor and Pay Out of Pocket Although you won't have to find out who is covered by your insurance plan, it is a good idea to ask around and get recommendations. You will then need to call the office and see if the doctor you have chosen will accept you as a new patient and what types of options they offer for patients who are self-pay. Some doctors offer discounts or will set up payment plans for their patients who do not have insurance, but you will need to ask  so you aren't surprised when you get to your appointment.  2) Contact Your Local Health Department Not all health departments have doctors that can see patients for sick visits, but many do, so it is worth a call to see if yours does. If you don't know where your local health department is, you can check in your phone book. The CDC also has a tool to help you locate your state's health department, and many state websites also have listings of all of their local health departments.  3) Find  a [facility] If your illness is not likely to be very severe or complicated, you may want to try a walk in clinic. These are popping up all over the country in pharmacies, drugstores, and shopping centers. They're usually staffed by nurse practitioners or physician assistants that have been trained to treat common illnesses and complaints. They're usually fairly quick and inexpensive. However, if you have serious medical issues or chronic medical problems, these are probably not your best option.  No Primary Care Doctor: - Call Health Connect at  [phone] - they can help you locate a primary care doctor that  accepts your insurance, provides certain services, etc. - Physician Referral Service- [phone]  Chronic Pain Problems: Organization         Address  Phone   Notes  [facility]  [phone] Patients need to be referred by their primary care doctor.   Medication Assistance: Organization         Address  Phone   Notes  [facility] Medication [facility] [address] [place], [place] [address] [phone] --Must be a resident of [facility] -- Must have NO insurance coverage whatsoever (no Medicaid/ Medicare, etc.) -- The pt. MUST have a primary care doctor that directs their care regularly and follows them in the community   Med[facility]ist  [phone]   [employer]  [phone]    Agencies that provide inexpensive medical care: Organization         Address  Phone   Notes  [place]  [phone]   [facility]    [phone]   [facility] [place], [place] [address] [phone]   [place] [address] [place] [phone]   [facility]    ([phone])[phone]   [facility]    [phone]   [place] [address], [place] Phone:  [phone], Fax:  [phone] Hours of Operation:  9 am - 6 pm, M-F.  Also accepts Medicaid/Medicare and self-pay.  [facility] for [place] [address], [address], [place] Phone: [phone], Fax: [phone]. Hours of Operation:  8:30 am - 5:30 pm, M-F.  Also accepts Medicaid and self-pay.  [facility] [place] [address], [place] Phone: [phone]   [place], [place], [place] [phone], Ext. [phone] Mondays & Thursdays: 7-9 AM.  First 15 patients are seen on a first come, first serve basis.    [place] Providers:  Organization         Address  Phone   Notes  [facility] [address],  [phone] Also accepts self-pay patients.  [place], [place], [place]  [phone]  [facility] [address], [place] ([phone]) [phone]   °[facility] [address] [place] [address], [place] ([phone]) [phone]   °Veita Bland [address], [place]  ° ([phone] Only accepts [place] Access Medicaid patients after they have their name applied to their card.  ° °Self-Pay (no insurance) in [place]: ° °Organization         Address  Phone   Notes  °Sickle Cell Patients, [facility] [address], [place] ([phone]) [phone]   °[facility] [address], [place] ([phone]) [phone]   °[facility] [place] ° [address], [place] ([phone]) [phone]   °[facility]/Dr. Osei-Bonsu ° [address] [place] [address], [place] or [address], [place] [phone]) [phone] Phone number for both [place] and [place] locations is the same.  °[facility] [address], [place] ([phone]) [phone]   °[facility] [address] [place] [address], [place] or [address] ([phone]) [phone] °([phone]) [phone]     °[facility] [address], [place] (336) [phone], phone; ([phone], fax Sees patients 1st and 3rd Saturday of every month.  Must not qualify for public or private insurance (i.e. Medicaid, Medicare, [place] Health Choice, Veterans' Benefits) • Household income should be no more than 200% of the poverty level •The clinic cannot treat you if you are pregnant or think you are pregnant • Sexually transmitted diseases are not treated at the clinic.  ° ° °Dental Care: °Organization         Address  Phone  Notes  °[place] [facility] [address], [place] ([phone]) [phone] Accepts children up to age 21 who are enrolled in Medicaid or [place] Health Choice; pregnant women with a Medicaid card; and children who have applied for Medicaid or [place] Health Choice, but were declined, whose parents can pay a reduced fee at time of service.  °[place] [facility] [place]  [address], [place] (336) [phone] Accepts children up to age 21 who are enrolled in Medicaid or [place] Health Choice; pregnant women with a Medicaid card; and children who have applied for Medicaid or  Health Choice, but were declined, whose parents can pay a reduced fee at time of service.  °[facility] ° [address], [place] ([phone]) [phone] Patients are seen by appointment only. Walk-ins are not accepted. [facility] will see patients 18 years of age and older. °Monday - Tuesday (8am-5pm) °Most Wednesdays (8:30-5pm) °$30 per visit, cash only  °[facility] ° [address], [place] ([phone]) [phone] Patients are seen by appointment only. Walk-ins are not accepted. [facility] will see patients 18 years of age and older. °One Wednesday Evening (Monthly: Volunteer Based).  $30 per visit, cash only  °[facility]  ([phone]) [phone] for adults; Children under age 4, call  Graduate Pediatric Dentistry at [phone]. Children aged 4-14, please call [phone]) [phone] to request a pediatric application. ° Dental services are provided in all areas of dental care including fillings, crowns and bridges, complete and partial dentures, implants, gum treatment, root canals, and extractions. Preventive care is also provided. Treatment is provided to both adults and children. °Patients are selected via a lottery and there is often a waiting   list. °  °[facility] [address], °[place] ° (336) 763-8833 [email or web address] °  °[facility] [address], [place], [place] (336)[phone], Ext. 123 Second and Fourth Thursday of each month, opens at 6:30 AM; Clinic ends at 9 AM.  Patients are seen on a first-come first-served basis, and a limited number are seen during each clinic.  ° °[facility] ° [address], [place], [place] ([phone]) [phone]   Eligibility Requirements °You must have lived in [place], [place], or [place] counties for at least the last three months. °  You cannot be eligible for state or federal sponsored healthcare insurance, including Veterans Administration, Medicaid, or Medicare. °  You generally cannot be eligible for healthcare insurance through your employer.  °  How to apply: °Eligibility screenings are held every Tuesday and Wednesday afternoon from 1:00 pm until 4:00 pm. You do not need an appointment for the interview!  °[facility] [address], [place], [place] [phone]   °[facility]  336-342-8273   °[facility]  336-703-3100   °[facility]  336-570-6415   ° °Behavioral Health Resources in the Community: °Intensive Outpatient Programs °Organization         Address  Phone  Notes  °[facility] [address], [place], [place] 336-878-6098   °[facility] [address], [place], [place]  336-832-9800   °[facility] [address], [place], [place] ° 336-882-2125   °[facility] [address],  °[place], [place] 1-800-853-5163 or 336-641-4981   °Substance Abuse Resources °Organization         Address  Phone  Notes  °[facility]  336-882-2125   °[facility]  336-784-9470   °[facility]  336-285-9073   °[facility]  336-845-3988   °Residential & Outpatient Substance Abuse Program  1-800-659-3381   °Psychological Services °Organization         Address  Phone  Notes  °[facility]  336- 832-9600   °[facility]  336- 378-7881   °[facility] [address], [place] 1-800-853-5163 or 336-641-4981   ° °Mobile Crisis Teams °Organization         Address  Phone  Notes  °[facility], Mobile Crisis Care Unit  1-877-626-1772   °Assertive °[facility] ° [address] [place], [place] 336-834-9664   °Sharon DeEsch [address] °[place] 336-554-5454   ° °Self-Help/Support Groups °Organization         Address  Phone             Notes  °[facility] [place] - variety of support groups  336- 373-1402 Call for more information  °Narcotics Anonymous (NA), [facility] [address], °[place]  2 meetings at this location  ° °Residential Treatment Programs °Organization         Address  Phone  Notes  °[facility] [address],    °[place]  1-866-801-8205   °[facility] ° [address], [place], [place] 704-293-8524   °[facility] [address], [place] 336-845-3988 Admissions: 8am-3pm M-F  °[facility] [address]    °[place], [place] 336-841-1104   °[facility] [address], ,  336-379-7146   °[facility]   [address]  [place], [place]   [facility] - Intensive Outpatient [address], [place], [place]   [facility] [address]  [place], [place] [phone] or ([phone]) [phone]   [facility] ([facility]) [address] [place], [place] Accepts Medicaid  [facility] [address]  [place] 1-223-536-0206 Substance Abuse/Addiction Treatment   [facility] Organization         Address  Phone  Notes  [facility]  (720) [phone]   Domenic Schwab, PhD [address] [place], [place]   619-563-7803 or [phone]   [facility] [place], [place] [phone]   [facility] [address], [place], [place] [phone] Insurance/Medicaid/sponsorship through [facility] and Families [address] [place]                                    [place], [place] [phone] [place] [address], [place] [phone]    Dr. Adele Schilder  [phone]   [facility] Dept. 1) [address], [place] 2) [place] 3)  [address], [place] [phone]  [phone]   [place] [phone] or [phone] (After Hours)

## 2013-03-19 NOTE — ED Provider Notes (Signed)
  Chief Complaint   Chief Complaint  Patient presents with  . Back Pain  . Abdominal Pain    History of Present Illness   Elizabeth Pope is a 33 year old female who was awakened last night out of her sleep at 3 AM this morning with sudden onset of severe pain in the right flank radiating to the right lower quadrant. The pain is getting worse and now is rated a 10 over 10 in intensity. It's described as sharp and cramping. She denies any fever, chills, nausea, or vomiting. She does not have much of an appetite. She denies any constipation, diarrhea, hematochezia, or urinary symptoms. She's had no GYN complaints. Her last menstrual period began January 22. She is not pregnant or breast-feeding. The patient states she had something like this before about 2 years ago and was diagnosed with urinary tract infection, but this is more severe.  Review of Systems   Other than as noted above, the patient denies any of the following symptoms: Constitutional:  No fever, chills, fatigue, weight loss or anorexia. Lungs:  No cough or shortness of breath. Heart:  No chest pain, palpitations, syncope or edema.  No cardiac history. Abdomen:  No nausea, vomiting, hematememesis, melena, diarrhea, or hematochezia. GU:  No dysuria, frequency, urgency, or hematuria. Gyn:  No vaginal discharge, itching, abnormal bleeding, dyspareunia, or pelvic pain.  [place]   Past medical history, family history, social history, meds, and allergies were reviewed along with nurse's notes.  No prior abdominal surgeries, past history of GI problems, STDs or GYN problems.  No history of aspirin or NSAID use.  No excessive alcohol intake.  Physical Exam     Vital signs:  BP 150/102  Pulse 82  Temp(Src) 97.8 F (36.6 C) (Oral)  Resp 18  SpO2 99%  LMP 03/17/2013 Gen:  Alert, oriented, she is moaning and writhing on the table and distress. Lungs:  Breath sounds clear and equal bilaterally.  No wheezes, rales or  rhonchi. Heart:  Regular rhythm.  No gallops or murmers.   Abdomen:  Abdomen was rigid, she has severe right lower quadrant tenderness to palpation with guarding and rebound. No masses or organomegaly. Bowel sounds were normally active. Skin:  Clear, warm and dry.  No rash.  Course in [facility] [place]   She was begun on IV normal saline at 50 mL per hour and given Dilaudid 2 mg IV and Zofran 4 mg IV.  Assessment   The encounter diagnosis was Right lower quadrant pain.  Differential diagnosis includes kidney stone, kidney infection, or appendicitis.  Plan     The patient was transferred to the ED via [place] in stable condition.  Medical Decision Making     33 year old female was awakened last night with severe pain in right flank and RLQ.  No fever, nausea, vomiting, or urinary symptoms.  On exam she is in severe pain, has tenderness to palpation in RLQ with guarding.  We  have given Dilaudid 2 mg IV and Zofran 4 mg IV and started IV.  Will transfer via [place].    Harden Mo, MD 03/19/13 1004

## 2013-03-19 NOTE — ED Notes (Addendum)
Pt c/o lower right back pain that radiates to RLQ onset yest  Has had similar pain in the past that resulted in UTI Denies: urinary sxs, gyn sxs, f/v/n/d Reports pain is constant and increases w/activity... Pain is 10/10 now Pt is alert but is crying and rocking back in forth while sitting on the exam table

## 2013-05-09 ENCOUNTER — Encounter (HOSPITAL_COMMUNITY): Payer: Self-pay | Admitting: Emergency Medicine

## 2013-05-09 ENCOUNTER — Emergency Department (INDEPENDENT_AMBULATORY_CARE_PROVIDER_SITE_OTHER)
Admission: EM | Admit: 2013-05-09 | Discharge: 2013-05-09 | Disposition: A | Discharge status: Home / Self Care | Payer: No Typology Code available for payment source | Source: Home / Self Care | Attending: Family Medicine | Admitting: Family Medicine

## 2013-05-09 DIAGNOSIS — J309 Allergic rhinitis, unspecified: Secondary | ICD-10-CM

## 2013-05-09 DIAGNOSIS — J302 Other seasonal allergic rhinitis: Secondary | ICD-10-CM

## 2013-05-09 LAB — POCT URINALYSIS DIP (DEVICE)
BILIRUBIN URINE: NEGATIVE
GLUCOSE, UA: NEGATIVE mg/dL
Ketones, ur: NEGATIVE mg/dL
NITRITE: NEGATIVE
Protein, ur: NEGATIVE mg/dL
Specific Gravity, Urine: >=1.03 (ref 1.005–1.030)
Urobilinogen, UA: 0.2 mg/dL (ref 0.0–1.0)
pH: 5.5 (ref 5.0–8.0)

## 2013-05-09 LAB — POCT PREGNANCY, URINE: PREG TEST UR: NEGATIVE

## 2013-05-09 MED ORDER — FLUTICASONE PROPIONATE 50 MCG/ACT NA SUSP: 1.0000 | Freq: Every day | NASAL | Status: DC

## 2013-05-09 MED ORDER — CETIRIZINE HCL 10 MG PO TABS: 10.0000 mg | ORAL_TABLET | Freq: Every day | ORAL | Status: DC

## 2013-05-09 NOTE — ED Provider Notes (Signed)
CSN: [id number]     Arrival date & time 05/09/13  1808 History   First MD Initiated Contact with Patient 05/09/13 1913     No chief complaint on file.  (Consider location/radiation/quality/duration/timing/severity/associated sxs/prior Treatment) Patient is a 33 y.o. female presenting with URI. The history is provided by the patient.  URI Presenting symptoms: congestion and rhinorrhea   Presenting symptoms: no cough, no fever and no sore throat   Severity:  Mild Progression:  Unchanged Chronicity:  New   Past Medical History  Diagnosis Date  . Migraine   . Depression   . Anxiety    History reviewed. No pertinent past surgical history. No family history on file. History  Substance Use Topics  . Smoking status: Current Every Day Smoker  . Smokeless tobacco: Not on file  . Alcohol Use: Yes   OB History   Grav Para Term Preterm Abortions TAB SAB Ect Mult Living                 Review of Systems  Constitutional: Negative.  Negative for fever.  HENT: Positive for congestion, postnasal drip and rhinorrhea. Negative for sore throat.   Respiratory: Negative for cough and shortness of breath.   Cardiovascular: Negative.   Gastrointestinal: Negative.  Negative for nausea and vomiting.  Genitourinary: Negative.     Allergies  Vicodin  Home Medications   Current Outpatient Rx  Name  Route  Sig  Dispense  Refill  . aspirin-acetaminophen-caffeine (EXCEDRIN MIGRAINE) 250-250-65 MG per tablet   Oral   Take 1 tablet by mouth every 6 (six) hours as needed for headache.         . ibuprofen (ADVIL,MOTRIN) 600 MG tablet   Oral   Take 1 tablet (600 mg total) by mouth every 6 (six) hours as needed.   20 tablet   0   . cetirizine (ZYRTEC) 10 MG tablet   Oral   Take 1 tablet (10 mg total) by mouth daily. One tab daily for allergies   30 tablet   1   . fluticasone (FLONASE) 50 MCG/ACT nasal spray   Each Nare   Place 1 spray into both nostrils daily.   1 g   2   .  oxyCODONE-acetaminophen (PERCOCET/ROXICET) 5-325 MG per tablet   Oral   Take 1-2 tablets by mouth every 6 (six) hours as needed for severe pain.   8 tablet   0    LMP 05/04/2013 Physical Exam  Nursing note and vitals reviewed. Constitutional: She is oriented to person, place, and time. She appears well-developed and well-nourished.  HENT:  Head: Normocephalic.  Right Ear: External ear normal.  Left Ear: External ear normal.  Nose: Mucosal edema and rhinorrhea present.  Mouth/Throat: Oropharynx is clear and moist.  Abdominal: Soft. Bowel sounds are normal. She exhibits no distension and no mass. There is no tenderness. There is no rebound and no guarding.  Lymphadenopathy:    She has no cervical adenopathy.  Neurological: She is alert and oriented to person, place, and time.  Skin: Skin is warm and dry.    ED Course  Procedures (including critical care time) Labs Review Labs Reviewed  POCT URINALYSIS DIP (DEVICE) - Abnormal; Notable for the following:    Hgb urine dipstick MODERATE (*)    Leukocytes, UA TRACE (*)    All other components within normal limits  POCT PREGNANCY, URINE   Imaging Review No results found.   MDM   1. Seasonal allergic rhinitis  Billy Fischer, MD 05/09/13 [phone]

## 2013-05-09 NOTE — ED Notes (Signed)
33 yr old female is here today with complaints of back/abdominal pain, chills, HA, ears itching, cough-wet/dry since Sunday morning. She has tried OTC Ibuprofen with no relief. Denies: SOB, Fever, chest pain

## 2013-10-20 ENCOUNTER — Emergency Department (HOSPITAL_COMMUNITY)
Admission: EM | Admit: 2013-10-20 | Discharge: 2013-10-20 | Disposition: A | Discharge status: Home / Self Care | Payer: No Typology Code available for payment source | Attending: Emergency Medicine | Admitting: Emergency Medicine

## 2013-10-20 ENCOUNTER — Encounter (HOSPITAL_COMMUNITY): Payer: Self-pay | Admitting: Emergency Medicine

## 2013-10-20 DIAGNOSIS — M25512 Pain in left shoulder: Secondary | ICD-10-CM

## 2013-10-20 DIAGNOSIS — M25519 Pain in unspecified shoulder: Secondary | ICD-10-CM | POA: Diagnosis not present

## 2013-10-20 DIAGNOSIS — F3289 Other specified depressive episodes: Secondary | ICD-10-CM | POA: Insufficient documentation

## 2013-10-20 DIAGNOSIS — F329 Major depressive disorder, single episode, unspecified: Secondary | ICD-10-CM | POA: Diagnosis not present

## 2013-10-20 DIAGNOSIS — F17200 Nicotine dependence, unspecified, uncomplicated: Secondary | ICD-10-CM | POA: Diagnosis not present

## 2013-10-20 DIAGNOSIS — F411 Generalized anxiety disorder: Secondary | ICD-10-CM | POA: Diagnosis not present

## 2013-10-20 DIAGNOSIS — R52 Pain, unspecified: Secondary | ICD-10-CM | POA: Insufficient documentation

## 2013-10-20 DIAGNOSIS — G43909 Migraine, unspecified, not intractable, without status migrainosus: Secondary | ICD-10-CM | POA: Diagnosis not present

## 2013-10-20 MED ORDER — TRAMADOL HCL 50 MG PO TABS: 50.0000 mg | ORAL_TABLET | Freq: Four times a day (QID) | ORAL | Status: DC | PRN

## 2013-10-20 MED ORDER — CYCLOBENZAPRINE HCL 10 MG PO TABS
5.0000 mg | ORAL_TABLET | Freq: Once | ORAL | Status: AC
  Administered 2013-10-20: 5 mg via ORAL
  Filled 2013-10-20: qty 1

## 2013-10-20 MED ORDER — MELOXICAM 15 MG PO TABS: 15.0000 mg | ORAL_TABLET | Freq: Every day | ORAL | Status: DC

## 2013-10-20 MED ORDER — TRAMADOL HCL 50 MG PO TABS
50.0000 mg | ORAL_TABLET | Freq: Once | ORAL | Status: AC
Start: 2013-10-20 — End: 2013-10-20
  Administered 2013-10-20: 50 mg via ORAL
  Filled 2013-10-20: qty 1

## 2013-10-20 MED ORDER — CYCLOBENZAPRINE HCL 10 MG PO TABS: 10.0000 mg | ORAL_TABLET | Freq: Three times a day (TID) | ORAL | Status: DC | PRN

## 2013-10-20 NOTE — ED Provider Notes (Signed)
CSN: [id number]     Arrival date & time 10/20/13  1837 History   None  This chart was scribed for non-physician practitioner Hazel Sams PA-C working with Dr. Wilson Singer by Rosary Lively, ED scribe. This patient was seen in room WTR7/WTR7 and the patient's care was started at 8:18 PM.    Chief Complaint  Patient presents with  . Arm Pain   The history is provided by the patient. No language interpreter was used.   HPI Comments:  Elizabeth Pope is a 33 y.o. female who presents to the Emergency Department complaining of 10/10 constant, throbbing, stabbing, severe pain in the left arm. Pain is worse with any movement. Pt reports that UC recommended that she visit orthopedist, however she did not follow through. She is taking ibuprofen and and Tylenol without any improvement. Denies any weakness or numbness to her hand. Pt expresses that she did have an injury a long time ago,but no recent injury; however within the last year the pain has become progressively worse. no other aggravating or alleviating fractures. No other associated symptoms.  Past Medical History  Diagnosis Date  . Migraine   . Depression   . Anxiety    History reviewed. No pertinent past surgical history. History reviewed. No pertinent family history. History  Substance Use Topics  . Smoking status: Current Every Day Smoker  . Smokeless tobacco: Not on file  . Alcohol Use: Yes   OB History   Grav Para Term Preterm Abortions TAB SAB Ect Mult Living                 Review of Systems  Musculoskeletal: Negative for neck pain and neck stiffness.  Neurological: Negative for weakness and numbness.  All other systems reviewed and are negative.     Allergies  Vicodin  Home Medications   Prior to Admission medications   Medication Sig Start Date End Date Taking? Authorizing Provider  ibuprofen (ADVIL,MOTRIN) 200 MG tablet Take 400 mg by mouth every 6 (six) hours as needed for moderate pain.   Yes Historical  Provider, MD   BP 142/93  Pulse 69  Temp(Src) 98.7 F (37.1 C) (Oral)  Resp 16  SpO2 100% Physical Exam  Nursing note and vitals reviewed. Constitutional: She is oriented to person, place, and time. She appears well-developed and well-nourished. No distress.  HENT:  Head: Normocephalic.  Neck: Normal range of motion. Neck supple.  No cervical midline tenderness  Cardiovascular: Normal rate and regular rhythm.   Pulmonary/Chest: Effort normal and breath sounds normal.  Musculoskeletal: She exhibits tenderness. She exhibits no edema.  She has pain to even light touch of the skin. Skin is normal without erythema or warmth noted. no swelling or deformities. Normal elbow and distal and exam. Normal pulses, sensation and grip strength through  Neurological: She is alert and oriented to person, place, and time.  Skin: Skin is warm and dry. No rash noted.  Psychiatric: She has a normal mood and affect. Her behavior is normal.    ED Course  Procedures  DIAGNOSTIC STUDIES: Oxygen Saturation is 100% on RA, normal by my interpretation.  COORDINATION OF CARE: 8:21 PM-the patient seen and evaluated through the no history of injury or trauma for exam is very limited as she has pain to even light touch. She does not allow for any range of motion. Discussed treatment plan with pt at bedside and pt agreed to plan.     MDM   Final diagnoses:  Shoulder pain,  acute, left    I personally performed the services described in this documentation, which was scribed in my presence. The recorded information has been reviewed and is accurate.    Martie Lee, PA-C 10/20/13 2102

## 2013-10-20 NOTE — Discharge Instructions (Signed)
Follow up with a primary care provider or orthopedic specialist for continued evaluation and treatment.    Shoulder Pain The shoulder is the joint that connects your arms to your body. The bones that form the shoulder joint include the upper arm bone (humerus), the shoulder blade (scapula), and the collarbone (clavicle). The top of the humerus is shaped like a ball and fits into a rather flat socket on the scapula (glenoid cavity). A combination of muscles and strong, fibrous tissues that connect muscles to bones (tendons) support your shoulder joint and hold the ball in the socket. Small, fluid-filled sacs (bursae) are located in different areas of the joint. They act as cushions between the bones and the overlying soft tissues and help reduce friction between the gliding tendons and the bone as you move your arm. Your shoulder joint allows a wide range of motion in your arm. This range of motion allows you to do things like scratch your back or throw a ball. However, this range of motion also makes your shoulder more prone to pain from overuse and injury. Causes of shoulder pain can originate from both injury and overuse and usually can be grouped in the following four categories:  Redness, swelling, and pain (inflammation) of the tendon (tendinitis) or the bursae (bursitis).  Instability, such as a dislocation of the joint.  Inflammation of the joint (arthritis).  Broken bone (fracture). HOME CARE INSTRUCTIONS   Apply ice to the sore area.  Put ice in a plastic bag.  Place a towel between your skin and the bag.  Leave the ice on for 15-20 minutes, 3-4 times per day for the first 2 days, or as directed by your health care provider.  Stop using cold packs if they do not help with the pain.  If you have a shoulder sling or immobilizer, wear it as long as your caregiver instructs. Only remove it to shower or bathe. Move your arm as little as possible, but keep your hand moving to prevent  swelling.  Squeeze a soft ball or foam pad as much as possible to help prevent swelling.  Only take over-the-counter or prescription medicines for pain, discomfort, or fever as directed by your caregiver. SEEK MEDICAL CARE IF:   Your shoulder pain increases, or new pain develops in your arm, hand, or fingers.  Your hand or fingers become cold and numb.  Your pain is not relieved with medicines. SEEK IMMEDIATE MEDICAL CARE IF:   Your arm, hand, or fingers are numb or tingling.  Your arm, hand, or fingers are significantly swollen or turn white or blue. MAKE SURE YOU:   Understand these instructions.  Will watch your condition.  Will get help right away if you are not doing well or get worse. Document Released: 11/20/2004 Document Revised: 06/27/2013 Document Reviewed: 01/25/2011 [facility] Patient Information 2015 [place], [place]. This information is not intended to replace advice given to you by your health care provider. Make sure you discuss any questions you have with your health care provider.

## 2013-10-20 NOTE — ED Notes (Signed)
Pt reports a few months ago she was suppose to follow up with orthopedics for left shoulder pain, pt did not. Not pt has shoulder, arm, and hand pain. Pain 10/10 able to wiggle fingers, reports she is unable to lift arm.

## 2013-10-21 NOTE — ED Provider Notes (Signed)
Medical screening examination/treatment/procedure(s) were performed by non-physician practitioner and as supervising physician I was immediately available for consultation/collaboration.   EKG Interpretation None       Virgel Manifold, MD 10/21/13 1635

## 2014-06-16 ENCOUNTER — Emergency Department (INDEPENDENT_AMBULATORY_CARE_PROVIDER_SITE_OTHER): Payer: Medicaid Other

## 2014-06-16 ENCOUNTER — Emergency Department (HOSPITAL_COMMUNITY): Payer: Medicaid Other

## 2014-06-16 ENCOUNTER — Encounter (HOSPITAL_COMMUNITY): Payer: Self-pay | Admitting: Emergency Medicine

## 2014-06-16 ENCOUNTER — Emergency Department (HOSPITAL_COMMUNITY)
Admission: EM | Admit: 2014-06-16 | Discharge: 2014-06-16 | Disposition: A | Discharge status: Home / Self Care | Payer: Medicaid Other | Source: Home / Self Care | Attending: Family Medicine | Admitting: Family Medicine

## 2014-06-16 DIAGNOSIS — M7041 Prepatellar bursitis, right knee: Secondary | ICD-10-CM

## 2014-06-16 DIAGNOSIS — R2232 Localized swelling, mass and lump, left upper limb: Secondary | ICD-10-CM

## 2014-06-16 IMAGING — DX DG ELBOW COMPLETE 3+V*L*
4 series · 4 of 4 positions shown · non-contrast
Comparison: None.

CLINICAL DATA: Mass overlying the olecranon.

EXAM:
LEFT ELBOW - COMPLETE 3+ VIEW

[elbow ap]
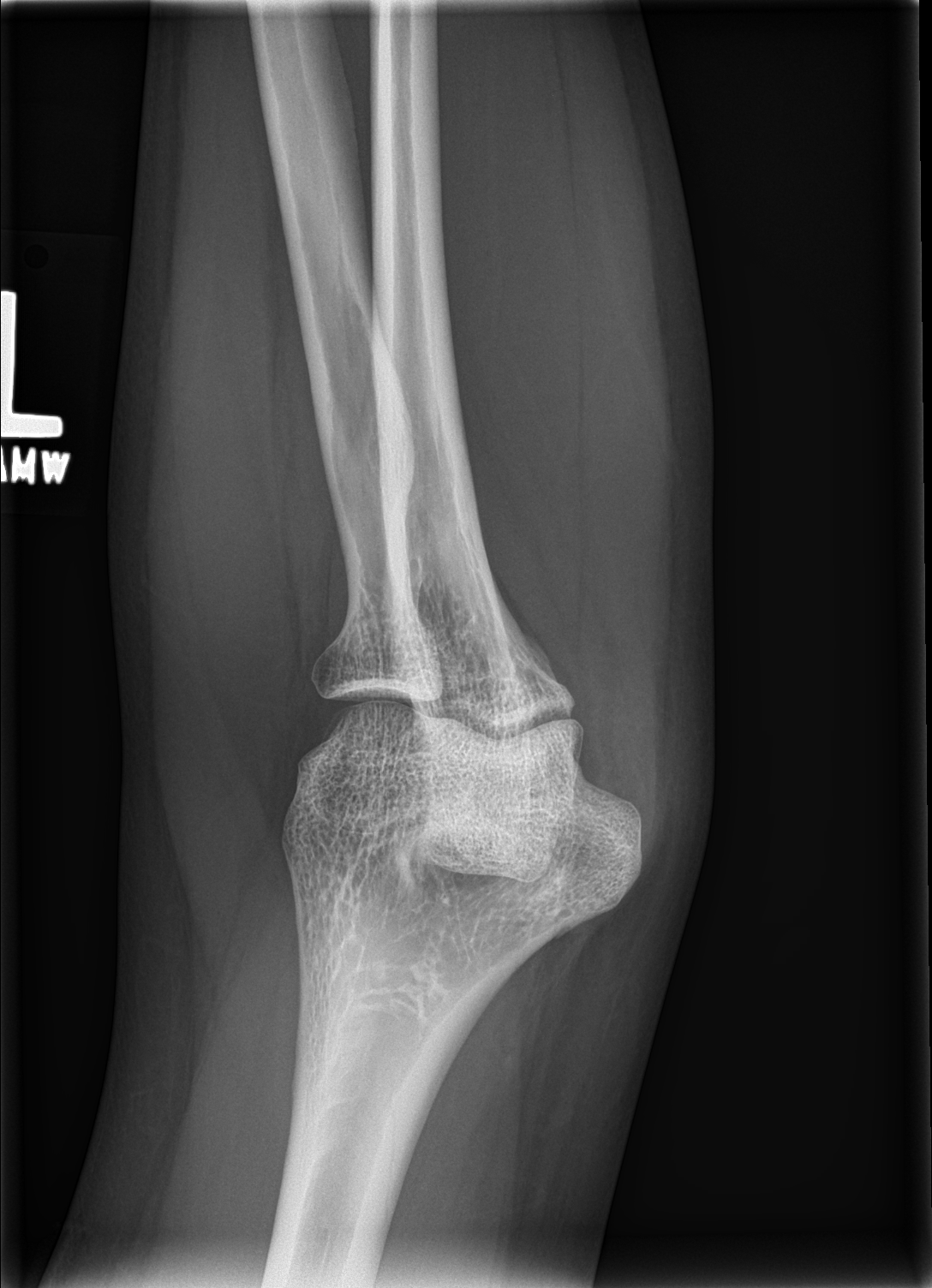

[elbow obl (1 of 2)]
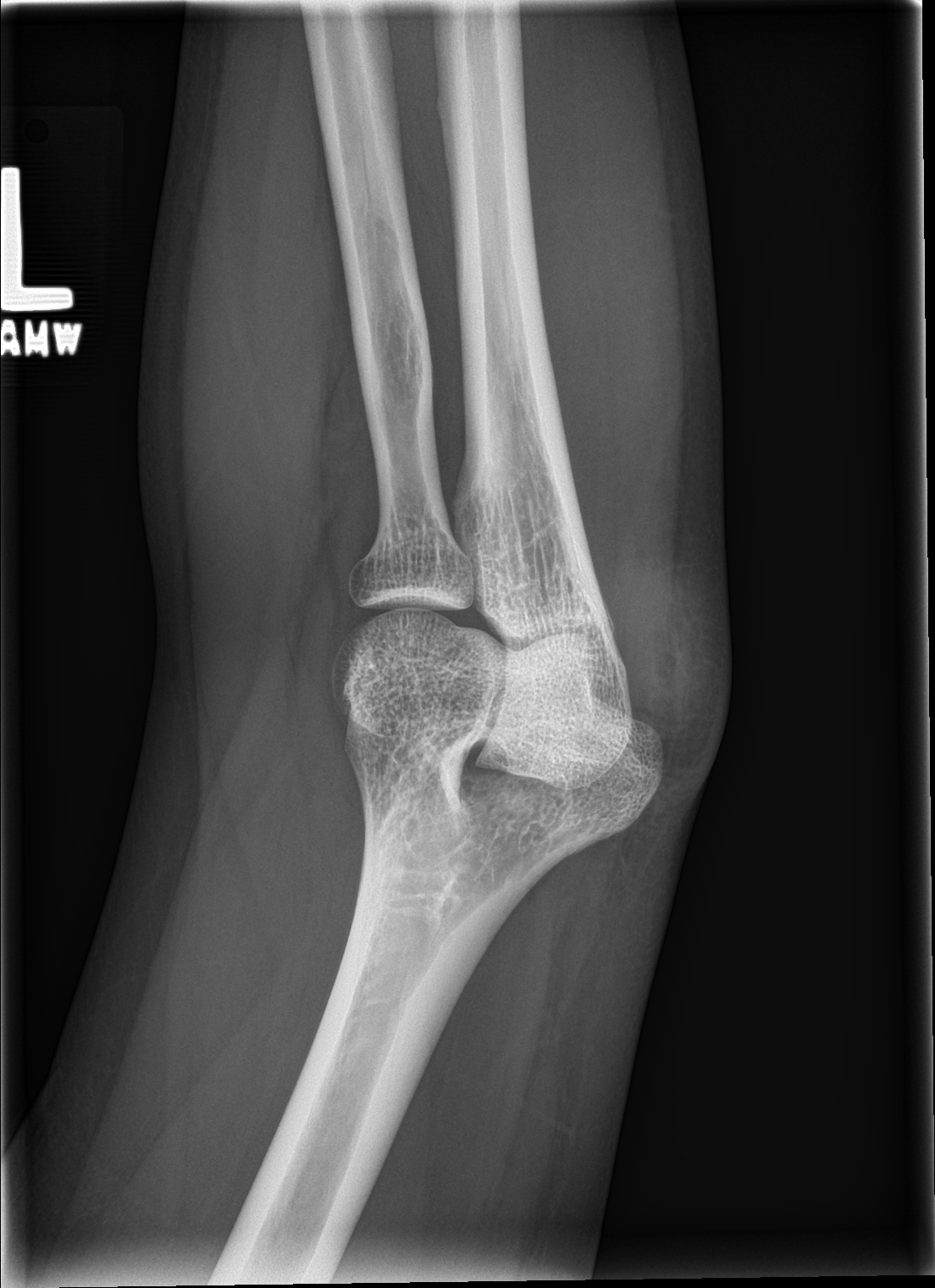

[elbow obl (2 of 2)]
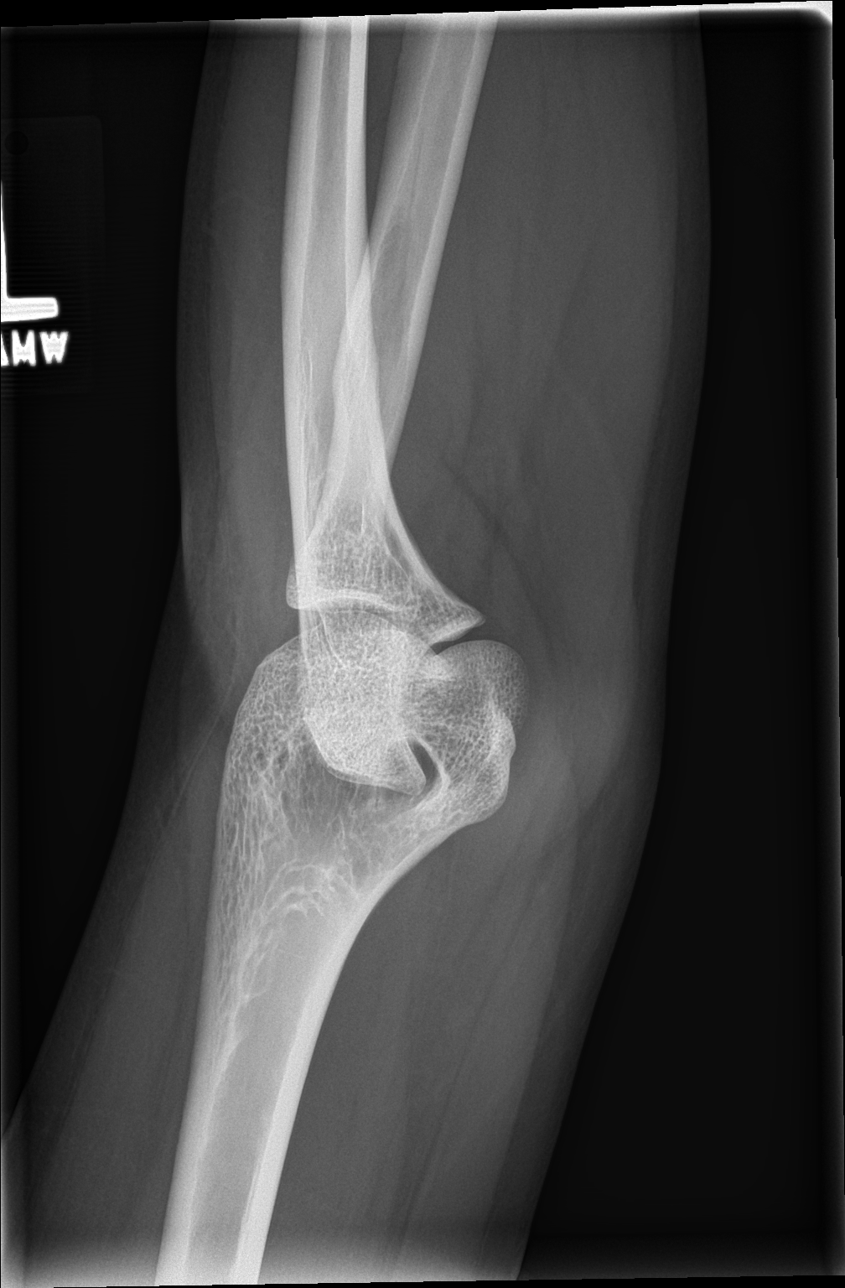

[elbow lat]
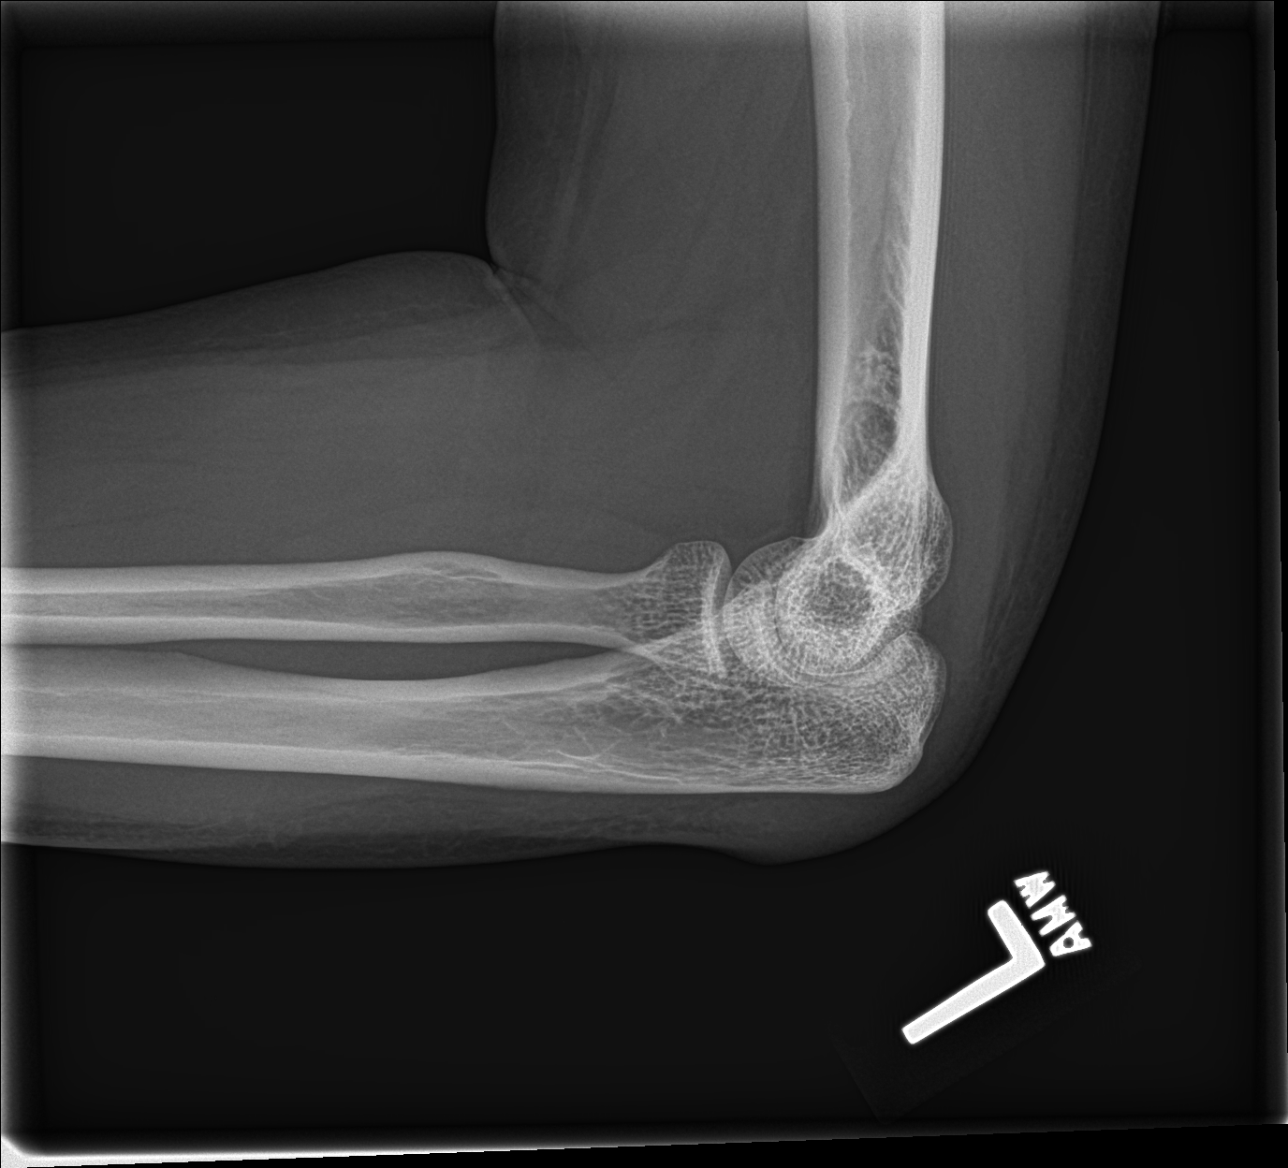

[4 of 4 positions shown; findings below may reference images not displayed]

FINDINGS: There is no evidence of fracture, dislocation, or joint effusion.
There is no evidence of arthropathy or other focal bone abnormality.
Subtle soft tissue prominence overlying the olecranon.
IMPRESSION: No acute osseous abnormality of the left elbow. Soft tissue
prominence overlying the olecranon which may reflect redundant skin
versus soft tissue abnormality. Correlate with clinical exam.

## 2014-06-16 IMAGING — DX DG KNEE AP/LAT W/ SUNRISE*R*
3 series · 3 of 3 positions shown · non-contrast
Comparison: None.

CLINICAL DATA: Anterior knee pain beginning when the patient woke
up, no trauma

EXAM:
RIGHT KNEE 3 VIEWS

[knee ap]
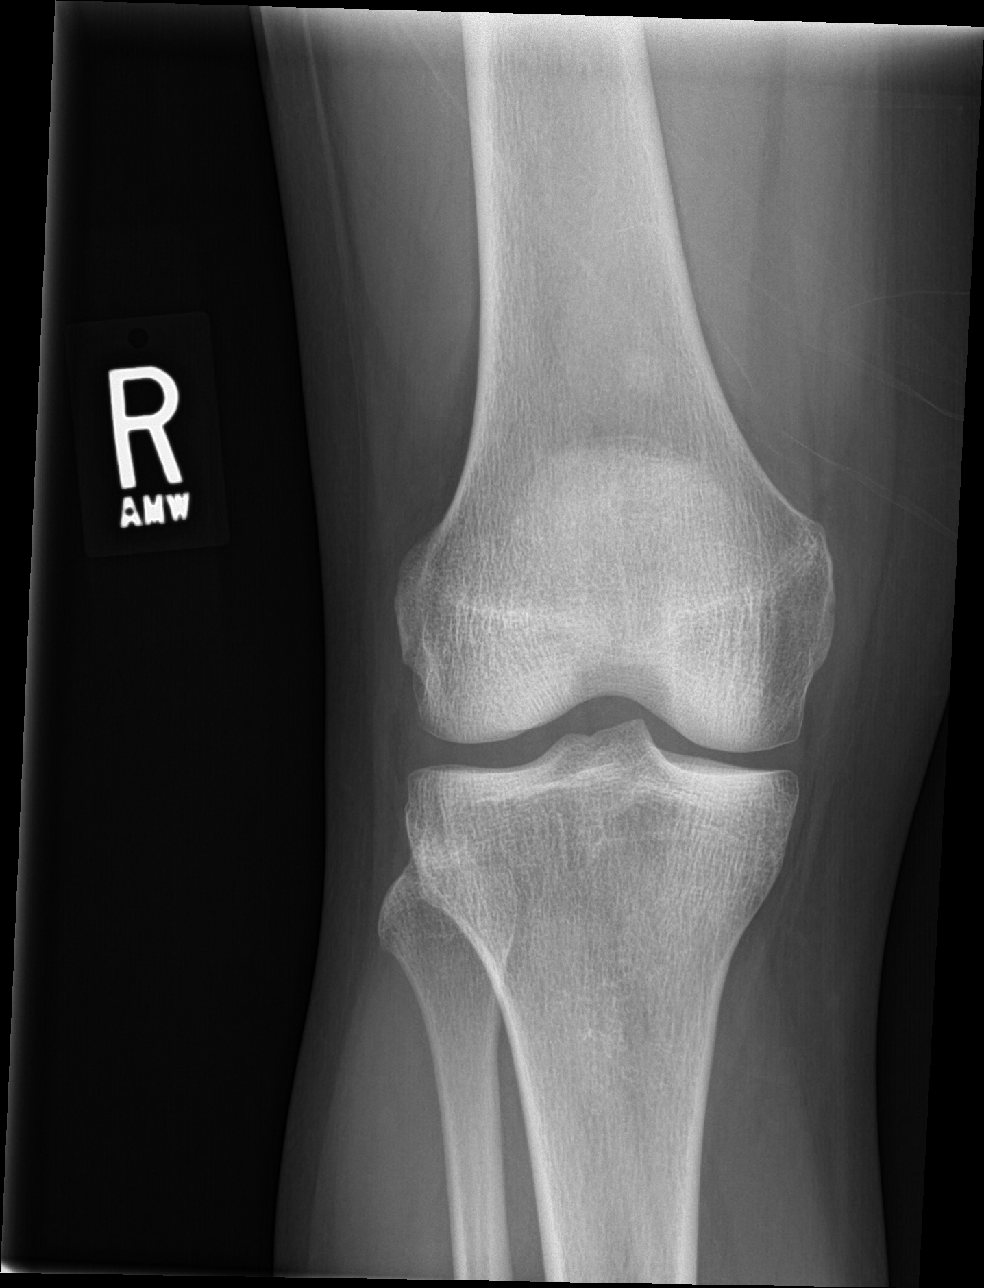

[knee obl]
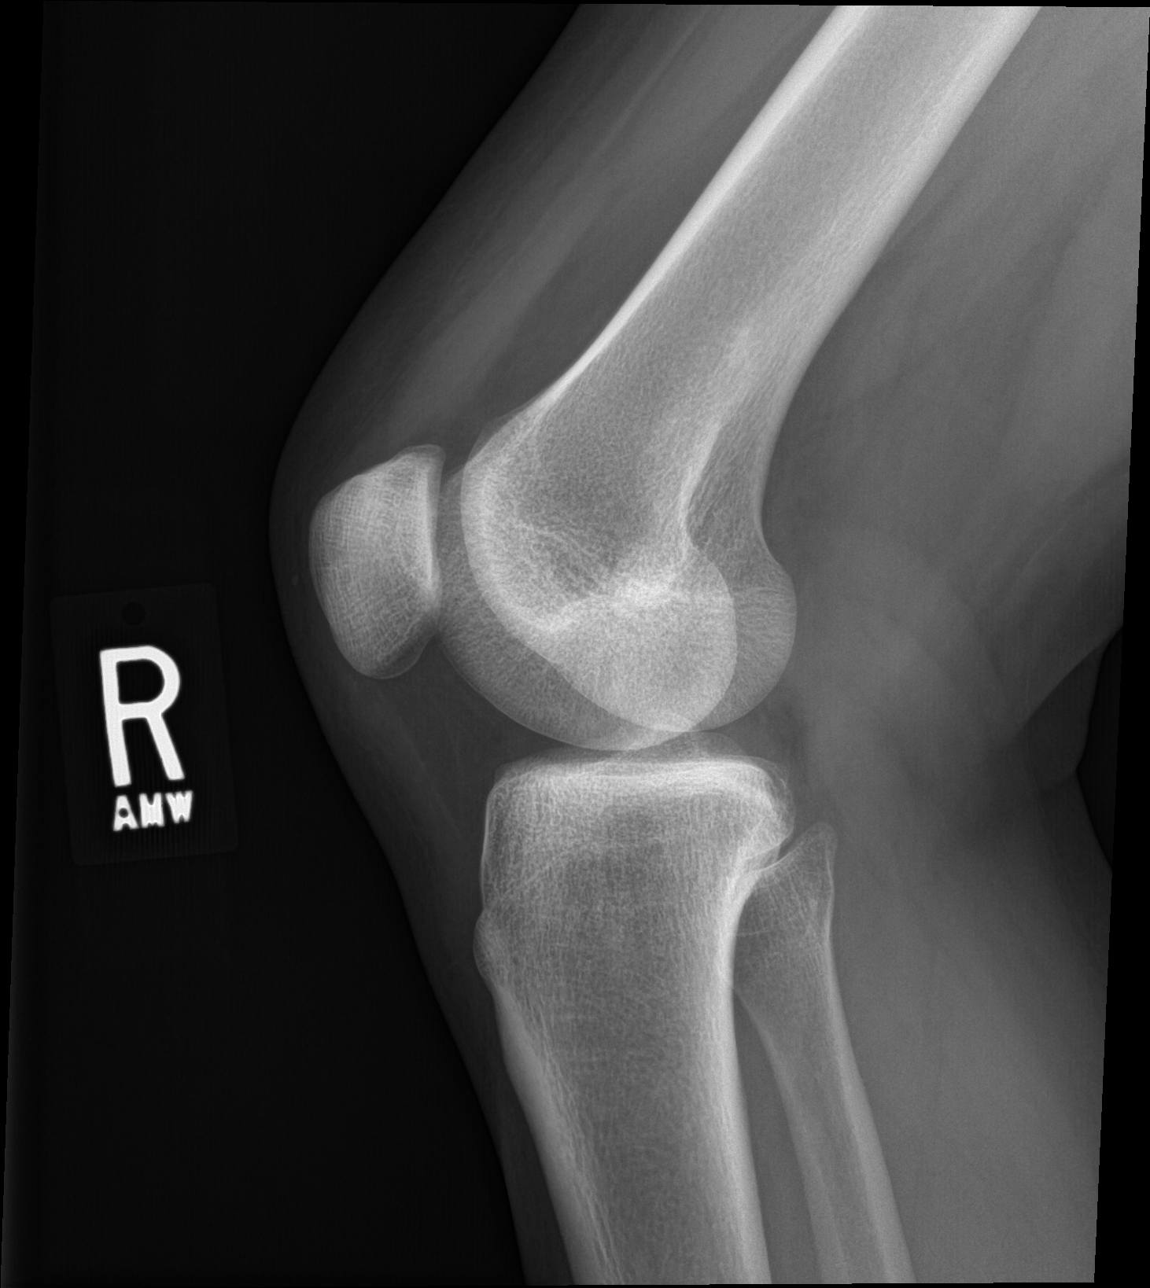

[patella skyline]
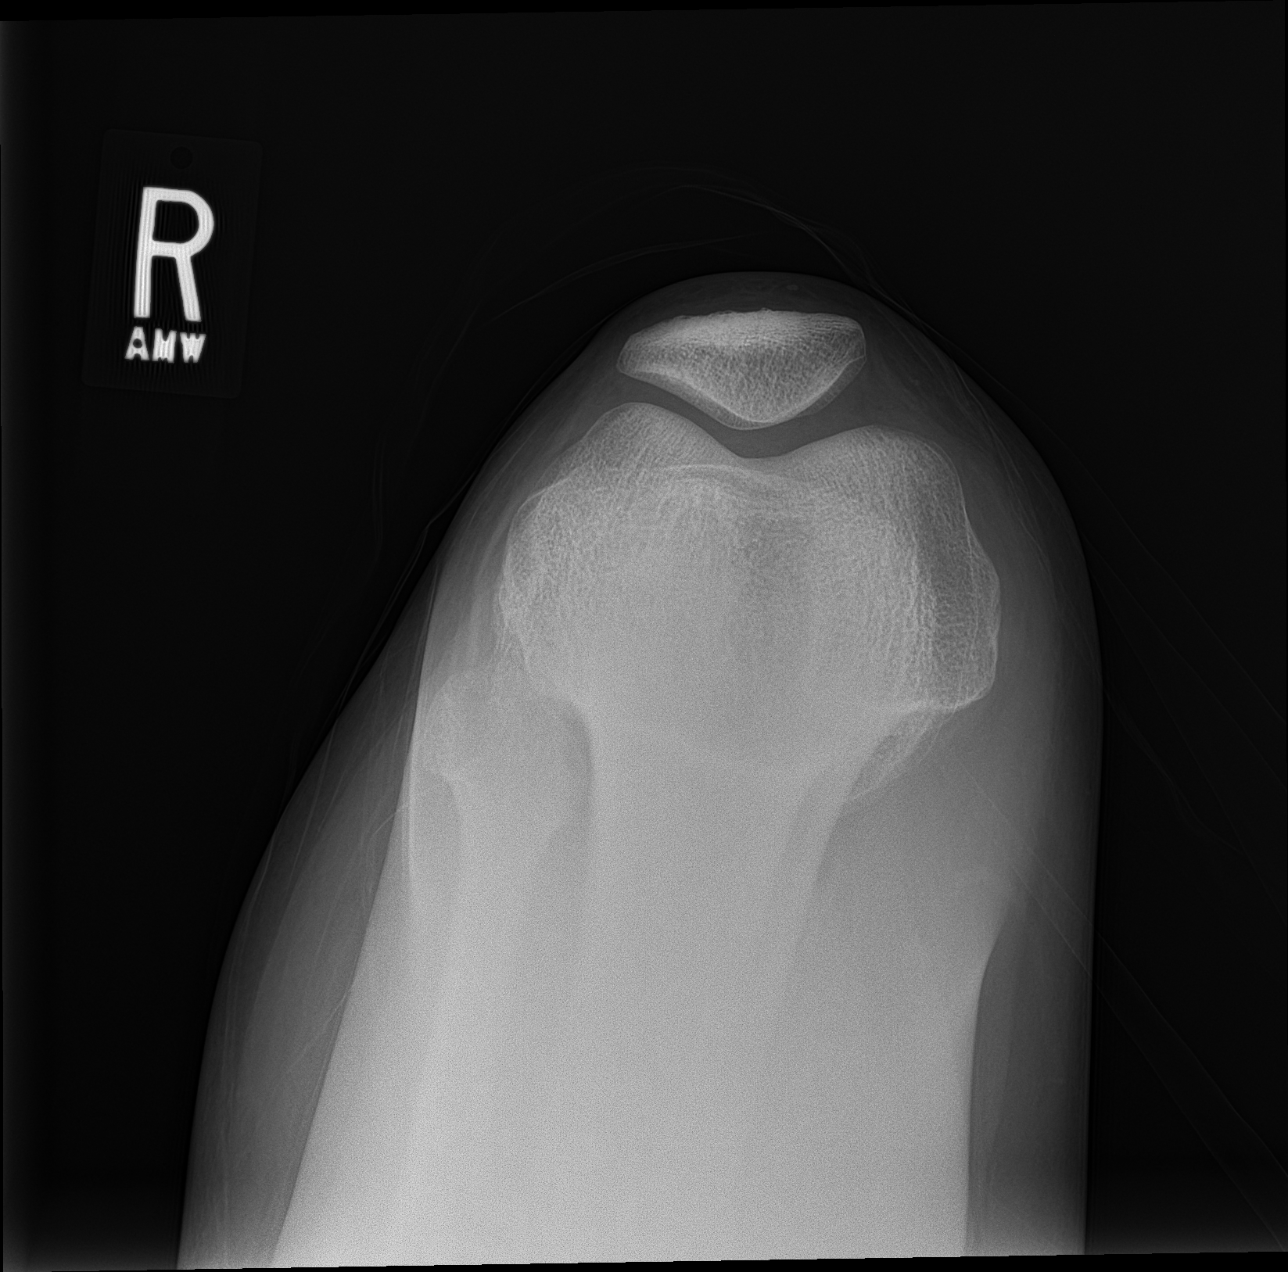

[3 of 3 positions shown; findings below may reference images not displayed]

FINDINGS: There is no evidence of fracture, dislocation, or joint effusion.
There is no evidence of arthropathy or other focal bone abnormality.
Soft tissues are unremarkable.
IMPRESSION: Negative.

## 2014-06-16 MED ORDER — PREDNISONE 10 MG (48) PO TBPK: ORAL_TABLET | Freq: Every day | ORAL | Status: DC

## 2014-06-16 NOTE — ED Notes (Signed)
Patient c/o right knee pain x 2 months. Denies any injury. Patient also has a mass on her left elbow. It has been there for a while patient states it just became bothersome and painful. Patient is in NAD.

## 2014-06-16 NOTE — ED Provider Notes (Signed)
Elizabeth Pope is a 34 y.o. female who presents to Urgent Care today for knee and elbow pain.  1) right knee pain. Patient is a two-month history of right anterior knee pain. Pain started gradually without injury. She notes the knee is painful to extend and to kneel. Pain is worse with climbing stairs. She notes the anterior portion the knee is very tender. No fevers or chills nausea vomiting or diarrhea. She's tried multiple over-the-counter medications which have not helped.  2) left elbow pain. Patient notes a tender nodule on the olecranon of her left elbow. This is been present for about 3 months. She denies any injury. Pain is worse with motion and touch. No treatment tried yet.    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   Past Medical History  Diagnosis Date  . Migraine   . Depression   . Anxiety    History reviewed. No pertinent past surgical history. History  Substance Use Topics  . Smoking status: Current Every Day Smoker -- 0.50 packs/day    Types: Cigarettes  . Smokeless tobacco: Not on file  . Alcohol Use: Yes   ROS as above Medications: No current facility-administered medications for this encounter.   Current Outpatient Prescriptions  Medication Sig Dispense Refill  . predniSONE (STERAPRED UNI-PAK 48 TAB) 10 MG (48) TBPK tablet Take by mouth daily. 48 tablet 0   Allergies  Allergen Reactions  . Vicodin [Hydrocodone-Acetaminophen] Itching     Exam:  BP 120/94 mmHg  Pulse 74  Temp(Src) 98.8 F (37.1 C) (Oral)  Resp 14  SpO2 100%  LMP 06/03/2014 Gen: Well NAD Right knee: Normal-appearing no significant effusion. Tender to palpation distal patella tendon along the insertion of the tibia. Range of motion 0-120 with 1+ retropatellar crepitation. Palpable squeak along the patellar tendon. Nontender at the joint line medially and laterally. Stable ligamentous  exam negative McMurray's test.  Left knee: Normal-appearing no effusion nontender Full range of motion. Stable edematous exam negative McMurray's test.  Left elbow: Visible and palpable mass in the area just distal to the olecranon. Tender to touch. No skin change. No erythema or induration. No fluctuance.  Elbow itself is nontender with normal motion.  Right elbow normal-appearing nontender normal motion Bilateral upper extremity sensation pulses and capillary refill are intact Antalgic gait present  Limited musculoskeletal ultrasound of the left olecranon elbow mass: Mass visualized. Mixed hypoechoic and hyperechoic change seen within the mass change with increased blood flow seen on Doppler assessment. Mass size is approximately 1 cm. Impression: Soft tissue mass. Undetermined etiology  Additionally the anterior knee was visualized. The patellar tendon is intact and normal appearing. However at the distal insertion onto the tibia there is soft tissue enlargement and hypoechoic change superficially. This is associated with increased Doppler activity. Transverse views of the tendon shows that the swelling and Doppler activity is seen along the superficial to medial portion of the tendon and not within the tendon structure itself. Impression: Prepatellar bursitis  No results found for this or any previous visit (from the past 24 hour(s)). Dg Elbow Complete Left  06/16/2014   CLINICAL DATA:  Mass overlying the olecranon.  EXAM: LEFT ELBOW - COMPLETE 3+ VIEW  COMPARISON:  None.  FINDINGS: There is no evidence of fracture, dislocation, or joint effusion. There is no evidence of arthropathy or other focal bone abnormality. Subtle soft tissue prominence  overlying the olecranon.  IMPRESSION: No acute osseous abnormality of the left elbow. Soft tissue prominence overlying the olecranon which may reflect redundant skin versus soft tissue abnormality. Correlate with clinical exam.   Electronically  Signed   By: Kathreen Devoid   On: 06/16/2014 18:21   Dg Knee Ap/lat W/sunrise Right  06/16/2014   CLINICAL DATA:  Anterior knee pain beginning when the patient woke up, no trauma  EXAM: RIGHT KNEE 3 VIEWS  COMPARISON:  None.  FINDINGS: There is no evidence of fracture, dislocation, or joint effusion. There is no evidence of arthropathy or other focal bone abnormality. Soft tissues are unremarkable.  IMPRESSION: Negative.   Electronically Signed   By: Conchita Paris M.D.   On: 06/16/2014 18:16    Assessment and Plan: 34 y.o. female with  1) prepatellar bursitis of the right knee. This is somewhat debilitating for the patient. She has ultrasound findings consistent with prepatellar bursitis but some physical exam findings suggestive of patellar tendinitis with significant pain with extension and antalgic gait. Patient has exhausted over-the-counter medication. We'll use prednisone Dosepak and simple knee extension exercises included straight leg raise test and range of motion exercises. Stress the importance for follow up with sports medicine. Patient may benefit from nitroglycerin patches if not improving.  2) elbow mass: Unclear etiology. This is not a simple cystic structure nor is it a calcification. Tophi is a possibility however patient does not have a history of gout. I'm concerned this may be some other soft tissue growth possibly malignant. She has increased vascular activity of the mass seen on ultrasound. I recommend that she have an MRI in the future to evaluate this. Follow-up with PCP or sports medicine for this further test.  Discussed warning signs or symptoms. Please see discharge instructions. Patient expresses understanding.     Gregor Hams, MD 06/16/14 1900

## 2014-06-16 NOTE — Discharge Instructions (Signed)
Thank you for coming in today. 1) knee: Apply ice. Take prednisone. Follow up with sports medicine. Work on straight leg raises and knee range of motion exercises. 2) elbow: Follow up with primary care or sports medicine. I suspect this will require an MRI.  Prepatellar Bursitis with Rehab  Bursitis is a condition that is characterized by inflammation of a bursa. Saunders Revel exists in many areas of the body. They are fluid-filled sacs that lie between a soft tissue (skin, tendon, or ligament) and a bone, and they reduce friction between the structures as well as the stress placed on the soft tissue. Prepatellar bursitis is inflammation of the bursa that lies between the skin and the kneecap (patella). This condition often causes pain over the patella. SYMPTOMS   Pain, tenderness, and/or inflammation over the patella.  Pain that worsens with movement of the knee joint.  Decreased range of motion for the knee joint.  A crackling sound (crepitation) when the bursa is moved or touched.  Occasionally, painless swelling of the bursa.  Fever (when infected). CAUSES  Bursitis is caused by damage to the bursa, which results in an inflammatory response. Common mechanisms of injury include:  Direct trauma to the front of the knee.  Repetitive and/or stressful use of the knee. RISK INCREASES WITH:  Activities in which kneeling and/or falling on one's knees is likely (volleyball or football).  Repetitive and stressful training, especially if it involves running on hills.  Improper training techniques, such as a sudden increase in the intensity, frequency, or duration of training.  Failure to warm up properly before activity.  Poor technique.  Artificial turf. PREVENTION   Avoid kneeling or falling on your knees.  Warm up and stretch properly before activity.  Allow for adequate recovery between workouts.  Maintain physical fitness:  Strength, flexibility, and  endurance.  Cardiovascular fitness.  Learn and use proper technique. When possible, have a coach correct improper technique.  Wear properly fitted and padded protective equipment (kneepads). PROGNOSIS  If treated properly, then the symptoms of prepatellar bursitis usually resolve within 2 weeks. RELATED COMPLICATIONS   Recurrent symptoms that result in a chronic problem.  Prolonged healing time, if improperly treated or reinjured.  Limited range of motion.  Infection of bursa.  Chronic inflammation or scarring of bursa. TREATMENT  Treatment initially involves the use of ice and medication to help reduce pain and inflammation. The use of strengthening and stretching exercises may help reduce pain with activity, especially those of the quadriceps and hamstring muscles. These exercises may be performed at home or with referral to a therapist. Your caregiver may recommend kneepads when you return to playing sports, in order to reduce the stress on the prepatellar bursa. If symptoms persist despite treatment, then your caregiver may drain fluid out with a needle (aspirate) the bursa. If symptoms persist for greater than 6 months despite nonsurgical (conservative) treatment, then surgery may be recommended to remove the bursa.  MEDICATION  If pain medication is necessary, then nonsteroidal anti-inflammatory medications, such as aspirin and ibuprofen, or other minor pain relievers, such as acetaminophen, are often recommended.  Do not take pain medication for 7 days before surgery.  Prescription pain relievers may be given if deemed necessary by your caregiver. Use only as directed and only as much as you need.  Corticosteroid injections may be given by your caregiver. These injections should be reserved for the most serious cases, because they may only be given a certain number of times. HEAT AND  COLD  Cold treatment (icing) relieves pain and reduces inflammation. Cold treatment should be  applied for 10 to 15 minutes every 2 to 3 hours for inflammation and pain and immediately after any activity that aggravates your symptoms. Use ice packs or massage the area with a piece of ice (ice massage).  Heat treatment may be used prior to performing the stretching and strengthening activities prescribed by your caregiver, physical therapist, or athletic trainer. Use a heat pack or soak the injury in warm water. SEEK MEDICAL CARE IF:  Treatment seems to offer no benefit, or the condition worsens.  Any medications produce adverse side effects. EXERCISES RANGE OF MOTION (ROM) AND STRETCHING EXERCISES - Prepatellar Bursitis These exercises may help you when beginning to rehabilitate your injury. Your symptoms may resolve with or without further involvement from your physician, physical therapist or athletic trainer. While completing these exercises, remember:   Restoring tissue flexibility helps normal motion to return to the joints. This allows healthier, less painful movement and activity.  An effective stretch should be held for at least 30 seconds.  A stretch should never be painful. You should only feel a gentle lengthening or release in the stretched tissue. STRETCH - Hamstrings, Standing  Stand or sit and extend your right / left leg, placing your foot on a chair or foot stool  Keeping a slight arch in your low back and your hips straight forward.  Lead with your chest and lean forward at the waist until you feel a gentle stretch in the back of your right / left knee or thigh. (When done correctly, this exercise requires leaning only a small distance.)  Hold this position for __________ seconds. Repeat __________ times. Complete this stretch __________ times per day. STRETCH - Quadriceps, Prone   Lie on your stomach on a firm surface, such as a bed or padded floor.  Bend your right / left knee and grasp your ankle. If you are unable to reach, your ankle or pant leg, use a  belt around your foot to lengthen your reach.  Gently pull your heel toward your buttocks. Your knee should not slide out to the side. You should feel a stretch in the front of your thigh and/or knee.  Hold this position for __________ seconds. Repeat __________ times. Complete this stretch __________ times per day.  STRETCH - Hamstrings/Adductors, V-Sit   Sit on the floor with your legs extended in a large "V," keeping your knees straight.  With your head and chest upright, bend at your waist reaching for your right foot to stretch your left adductors.  You should feel a stretch in your left inner thigh. Hold for __________ seconds.  Return to the upright position to relax your leg muscles.  Continuing to keep your chest upright, bend straight forward at your waist to stretch your hamstrings.  You should feel a stretch behind both of your thighs and/or knees. Hold for __________ seconds.  Return to the upright position to relax your leg muscles.  Repeat steps 2 through 4. Repeat __________ times. Complete this exercise __________ times per day.  STRENGTHENING EXERCISES - Prepatellar Bursitis  These exercises may help you when beginning to rehabilitate your injury. They may resolve your symptoms with or without further involvement from your physician, physical therapist or athletic trainer. While completing these exercises, remember:  Muscles can gain both the endurance and the strength needed for everyday activities through controlled exercises.  Complete these exercises as instructed by your physician, physical  therapist or athletic trainer. Progress the resistance and repetitions only as guided. STRENGTH - Quadriceps, Isometrics  Lie on your back with your right / left leg extended and your opposite knee bent.  Gradually tense the muscles in the front of your right / left thigh. You should see either your kneecap slide up toward your hip or increased dimpling just above the  knee. This motion will push the back of the knee down toward the floor/mat/bed on which you are lying.  Hold the muscle as tight as you can without increasing your pain for __________ seconds.  Relax the muscles slowly and completely in between each repetition. Repeat __________ times. Complete this exercise __________ times per day.  STRENGTH - Quadriceps, Short Arcs   Lie on your back. Place a __________ inch towel roll under your knee so that the knee slightly bends.  Raise only your lower leg by tightening the muscles in the front of your thigh. Do not allow your thigh to rise.  Hold this position for __________ seconds. Repeat __________ times. Complete this exercise __________ times per day.  OPTIONAL ANKLE WEIGHTS: Begin with ____________________, but DO NOT exceed ____________________. Increase in1 lb/0.5 kg increments.  STRENGTH - Quadriceps, Straight Leg Raises  Quality counts! Watch for signs that the quadriceps muscle is working to insure you are strengthening the correct muscles and not "cheating" by substituting with healthier muscles.  Lay on your back with your right / left leg extended and your opposite knee bent.  Tense the muscles in the front of your right / left thigh. You should see either your kneecap slide up or increased dimpling just above the knee. Your thigh may even quiver.  Tighten these muscles even more and raise your leg 4 to 6 inches off the floor. Hold for __________ seconds.  Keeping these muscles tense, lower your leg.  Relax the muscles slowly and completely in between each repetition. Repeat __________ times. Complete this exercise __________ times per day.  STRENGTH - Quadriceps, Step-Ups   Use a thick book, step or step stool that is __________ inches tall.  Holding a wall or counter for balance only, not support.  Slowly step-up with your right / left foot, keeping your knee in line with your hip and foot. Do not allow your knee to bend so  far that you cannot see your toes.  Slowly unlock your knee and lower yourself to the starting position. Your muscles, not gravity, should lower you. Repeat __________ times. Complete this exercise __________ times per day. Document Released: 02/10/2005 Document Revised: 06/27/2013 Document Reviewed: 05/25/2008 [facility] Patient Information 2015 [place], [place]. This information is not intended to replace advice given to you by your health care provider. Make sure you discuss any questions you have with your health care provider.

## 2015-03-29 ENCOUNTER — Ambulatory Visit (HOSPITAL_COMMUNITY)
Admission: RE | Admit: 2015-03-29 | Discharge: 2015-03-29 | Disposition: A | Discharge status: Home / Self Care | Payer: Medicaid Other | Source: Ambulatory Visit | Attending: Rheumatology | Admitting: Rheumatology

## 2015-03-29 ENCOUNTER — Other Ambulatory Visit (HOSPITAL_COMMUNITY): Payer: Self-pay | Admitting: Rheumatology

## 2015-03-29 DIAGNOSIS — Z5189 Encounter for other specified aftercare: Secondary | ICD-10-CM

## 2015-03-29 DIAGNOSIS — Z9225 Personal history of immunosupression therapy: Secondary | ICD-10-CM | POA: Diagnosis present

## 2015-03-29 DIAGNOSIS — R911 Solitary pulmonary nodule: Secondary | ICD-10-CM | POA: Insufficient documentation

## 2015-03-29 IMAGING — DX DG CHEST 2V
2 series · 2 of 2 positions shown · non-contrast
Comparison: None.

CLINICAL DATA: Suppression therapy.

EXAM:
CHEST  2 VIEW

[w chest pa]
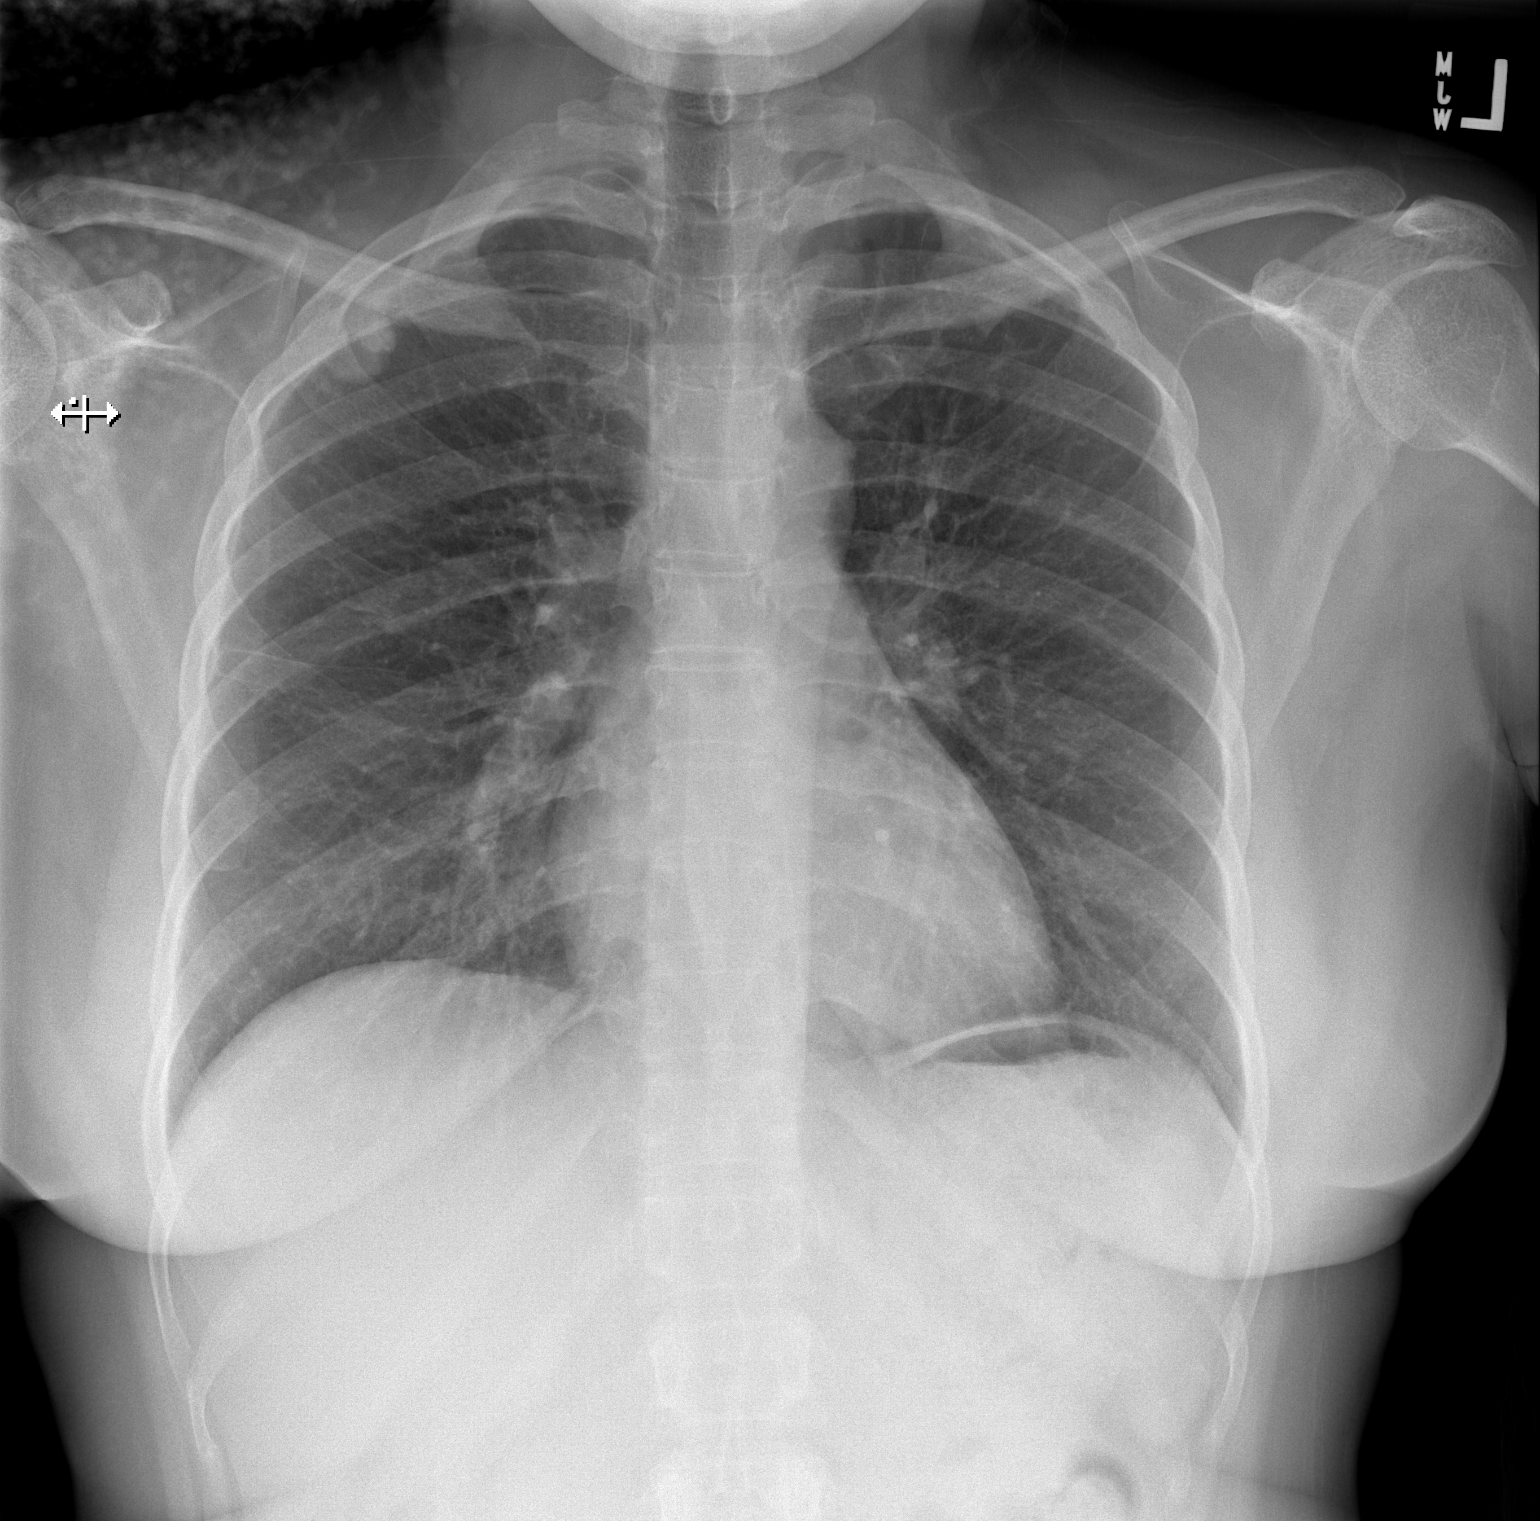

[w chest lat]
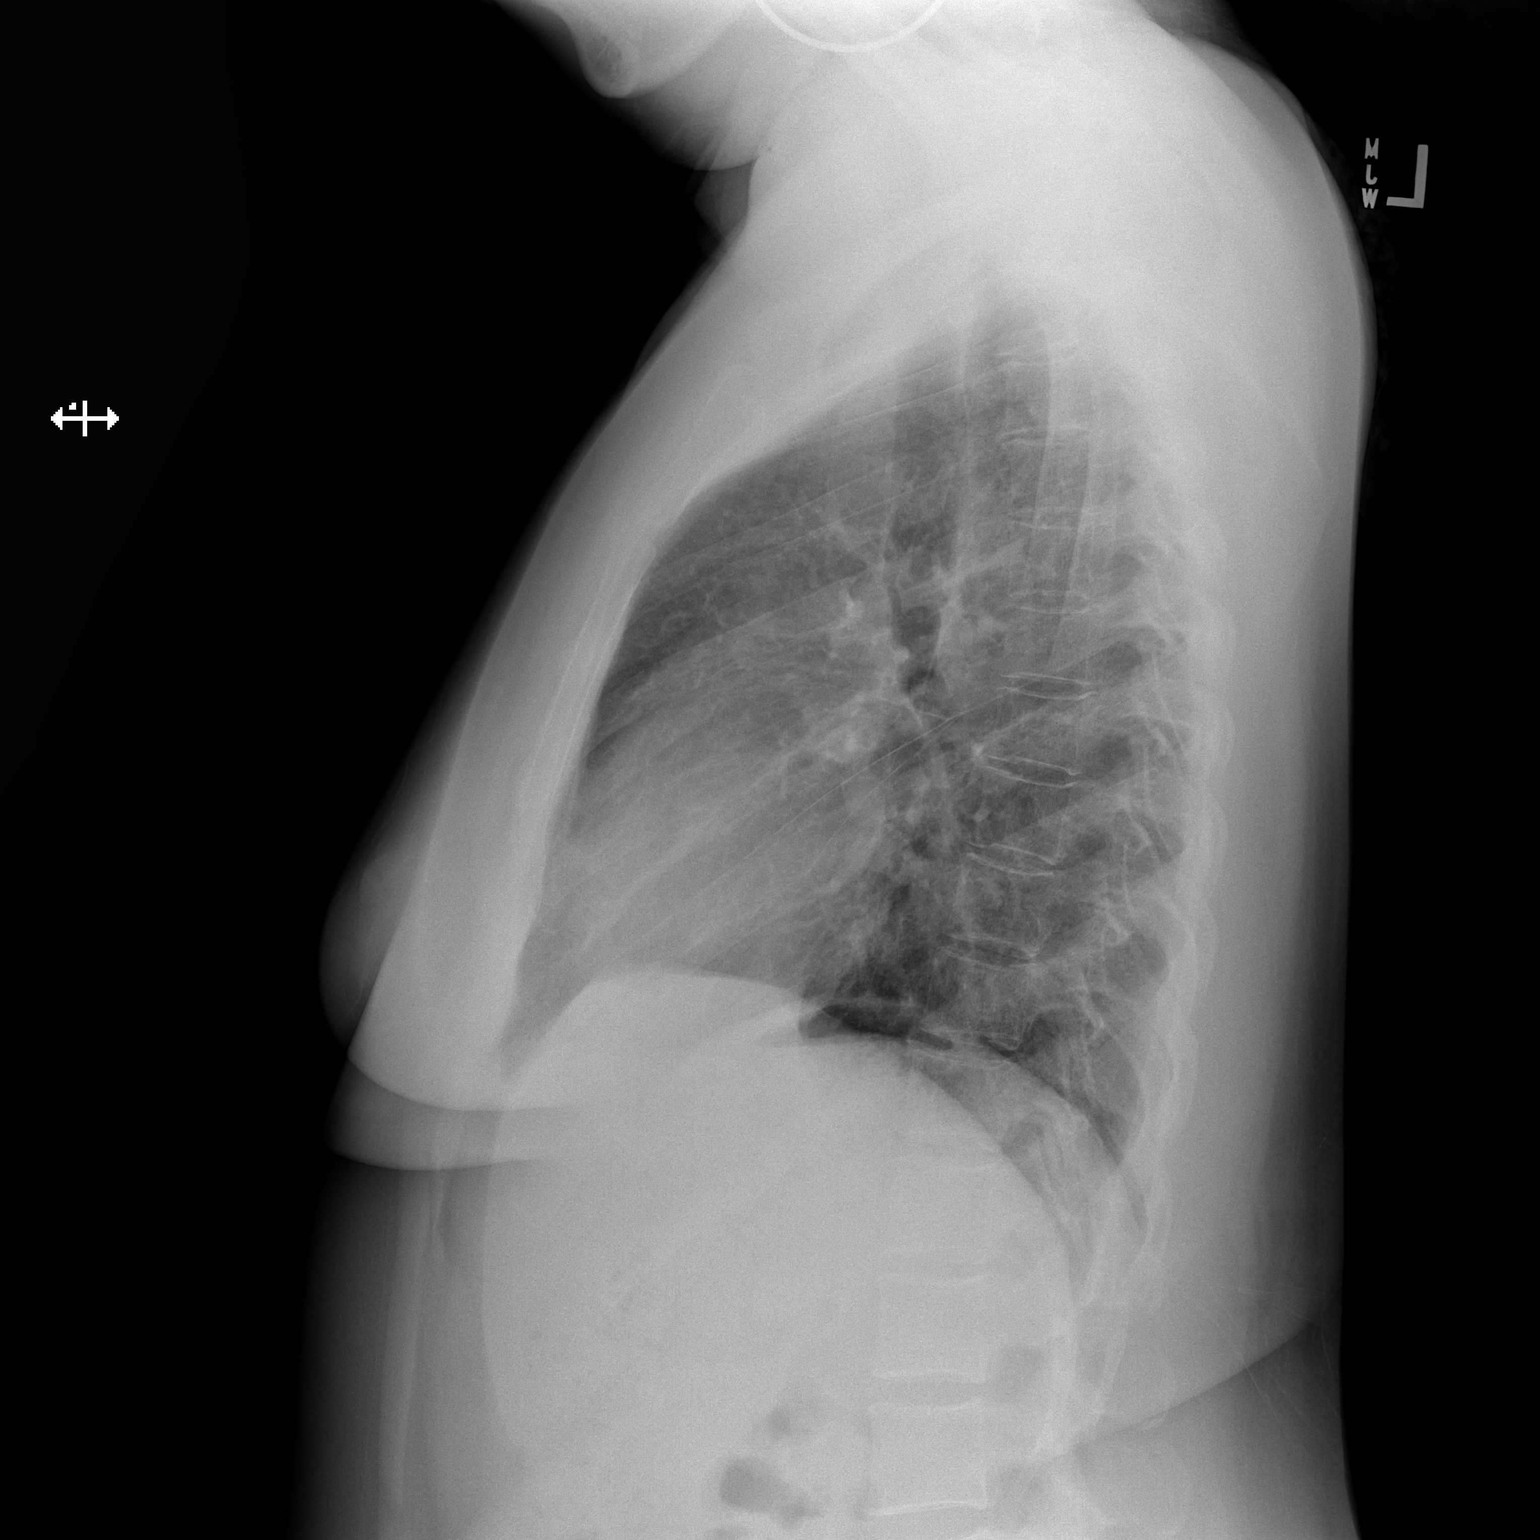

[2 of 2 positions shown; findings below may reference images not displayed]

FINDINGS: There is a calcified right apical nodule likely reflecting sequela
prior granulomatous disease. There is no focal consolidation. There
is no pleural effusion or pneumothorax. The heart and mediastinal
contours are unremarkable.

The osseous structures are unremarkable.
IMPRESSION: No active cardiopulmonary disease.

## 2015-05-04 ENCOUNTER — Ambulatory Visit (INDEPENDENT_AMBULATORY_CARE_PROVIDER_SITE_OTHER): Payer: Medicaid Other | Admitting: Internal Medicine

## 2015-05-04 ENCOUNTER — Encounter: Payer: Self-pay | Admitting: Internal Medicine

## 2015-05-04 ENCOUNTER — Other Ambulatory Visit: Payer: Medicaid Other

## 2015-05-04 ENCOUNTER — Telehealth: Payer: Self-pay | Admitting: Internal Medicine

## 2015-05-04 VITALS — BP 124/80 | HR 96 | Ht 64.0 in | Wt 158.6 lb

## 2015-05-04 DIAGNOSIS — Z720 Tobacco use: Secondary | ICD-10-CM | POA: Diagnosis not present

## 2015-05-04 DIAGNOSIS — R079 Chest pain, unspecified: Secondary | ICD-10-CM

## 2015-05-04 DIAGNOSIS — R911 Solitary pulmonary nodule: Secondary | ICD-10-CM | POA: Diagnosis not present

## 2015-05-04 DIAGNOSIS — F17210 Nicotine dependence, cigarettes, uncomplicated: Secondary | ICD-10-CM

## 2015-05-04 NOTE — Progress Notes (Signed)
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 Pulmonary Consult    Referred by Dr. Bo Merino for eval of pulmonary nodule. Pt c/o CP that comes and goes "feels like my chest is caving in".   some am cough x one week min mucus/  Recurrent longstanding  cp midline 10-20 min hurts worse with deep breath, no pain with cough typically and pain always midline, never lateralizing.  No obvious other patterns in day to day or daytime variabilty or assoc sob  or chest tightness, subjective wheeze overt sinus or hb symptoms. No unusual exp hx or h/o childhood pna/ asthma or knowledge of premature birth.  Sleeping ok without nocturnal  or early am exacerbation  of respiratory  c/o's or need for noct saba. Also denies any obvious fluctuation of symptoms with weather or environmental changes or other aggravating or alleviating factors except as outlined above   Current Medications, Allergies, Complete Past Medical History, Past Surgical History, Family History, and Social History were reviewed in [employer] record.            Review of Systems  Constitutional: Negative for fever, chills and unexpected weight change.  HENT: Positive for sneezing. Negative for congestion, dental problem, ear pain, nosebleeds, postnasal drip, rhinorrhea, sinus pressure, sore throat, trouble swallowing and voice change.   Eyes: Negative for visual disturbance.  Respiratory: Negative for cough, choking and shortness of breath.   Cardiovascular: Positive for chest pain. Negative for leg swelling.    Gastrointestinal: Positive for abdominal pain. Negative for vomiting and diarrhea.  Genitourinary: Negative for difficulty urinating.  Musculoskeletal: Positive for arthralgias.  Skin: Negative for rash.  Neurological: Negative for tremors, syncope and headaches.  Hematological: Does not bruise/bleed easily.       Objective:   Physical Exam  Wt Readings from Last 3 Encounters:  05/04/15 158 lb 9.6 oz (71.94 kg)  03/19/13 157 lb (71.215 kg)    Vital signs reviewed   HEENT: nl dentition, turbinates, and oropharynx. Nl external ear canals without cough reflex   NECK :  without JVD/Nodes/TM/ nl carotid upstrokes bilaterally   LUNGS: no acc muscle use,  Nl contour chest which is clear to A and P bilaterally without cough on insp or exp maneuvers   CV:  RRR  no s3 or murmur or increase in P2, no edema   ABD:  soft and nontender with nl inspiratory excursion in the supine position. No bruits or organomegaly, bowel sounds nl  MS:  Nl gait/ ext warm without deformities, calf tenderness, cyanosis or clubbing No obvious joint restrictions   SKIN: warm and dry without lesions    NEURO:  alert, approp, nl sensorium with  no motor deficits     I personally reviewed images and agree with radiology impression as follows:  CXR:  03/29/15 There is a calcified right apical nodule likely reflecting sequela prior granulomatous disease. There is no focal consolidation. There is no pleural effusion or pneumothorax. The heart and mediastinal contours are unremarkable.Marland Kitchen  Assessment & Plan:

## 2015-05-04 NOTE — Telephone Encounter (Signed)
aware

## 2015-05-04 NOTE — Patient Instructions (Addendum)
Please remember to go to the lab  department downstairs for your tests - we will call you with the results when they are available.  See if you can obtain old cxr's and return to office or call us and let us know you couldn't  Late add:  Referred to [facility] for rx/f/u

## 2015-05-04 NOTE — Telephone Encounter (Signed)
Will forward to MW as FYI  

## 2015-05-05 DIAGNOSIS — R079 Chest pain, unspecified: Secondary | ICD-10-CM | POA: Insufficient documentation

## 2015-05-05 DIAGNOSIS — F17210 Nicotine dependence, cigarettes, uncomplicated: Secondary | ICD-10-CM | POA: Insufficient documentation

## 2015-05-05 NOTE — Assessment & Plan Note (Signed)
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  Since she does not require any pulmonary intervention, other that to quit smoking, I would like [facility] to determine best rx and monitoring in this setting and will refer her on to them.

## 2015-05-05 NOTE — Assessment & Plan Note (Signed)
She is developing a typical smoker's cough at this point but no other features of copd.  > 3 min discussion I reviewed the Fletcher curve with the patient that basically indicates  if you quit smoking when your best day FEV1 is still well preserved (as is clearly  the case here)  it is highly unlikely you will progress to severe disease and informed the patient there was no medication on the market that has proven to alter the curve/ its downward trajectory  or the likelihood of progression of their disease.  Therefore stopping smoking and maintaining abstinence is the most important aspect of care, not choice of inhalers or for that matter, doctors.    Pulmonary f/u is prn

## 2015-05-05 NOTE — Assessment & Plan Note (Signed)
Chronic / recurrent midline discomfort is non-specific but not classically pleuritic nor of cardiac, GI etiology and no further f/u needed at this point as pain is sporadic and resolves spont so may be IBS related as does not typically occur supine > rec f/u primary care

## 2015-05-07 ENCOUNTER — Telehealth: Payer: Self-pay | Admitting: *Deleted

## 2015-05-07 LAB — QUANTIFERON TB GOLD ASSAY (BLOOD)
INTERFERON GAMMA RELEASE ASSAY: NEGATIVE
Mitogen-Nil: 9.73 IU/mL
QUANTIFERON NIL VALUE: 0.08 [IU]/mL
Quantiferon Tb Ag Minus Nil Value: 0.01 IU/mL

## 2015-05-07 NOTE — Progress Notes (Signed)
Quick Note:  Spoke with pt and notified of results per Dr. Melvyn Novas. She is asking why she needs to see [facility] if this was neg ______

## 2015-05-07 NOTE — Telephone Encounter (Signed)
Patient notified of Dr. Gustavus Bryant recommendations. Patient asked about being put on Methotrexate, advised her to wait until she sees Dr. Linus Salmons at [facility] before starting any other immunosuppressant drug because we are not sure what he is going to treat her with for TB.   Advised her of referral. Nothing further needed.

## 2015-05-07 NOTE — Telephone Encounter (Signed)
-----   Message from Tanda Rockers, MD sent at 05/05/2015  8:20 AM EST ----- After reviewed records I feel she will likely need TB rx but should be directed by [facility]> referred  [facility] (already ordered, just let her know pcc will be calling

## 2015-05-07 NOTE — Telephone Encounter (Signed)
LMTCB

## 2015-05-08 NOTE — Progress Notes (Signed)
Quick Note:  Spoke with pt and notified of results per Dr. Wert. Pt verbalized understanding and denied any questions.  ______ 

## 2015-05-14 ENCOUNTER — Encounter: Payer: Self-pay | Admitting: Internal Medicine

## 2015-05-14 ENCOUNTER — Ambulatory Visit (INDEPENDENT_AMBULATORY_CARE_PROVIDER_SITE_OTHER): Payer: Medicaid Other | Admitting: Internal Medicine

## 2015-05-14 VITALS — BP 120/85 | HR 77 | Temp 98.1°F | Wt 160.0 lb

## 2015-05-14 DIAGNOSIS — F3181 Bipolar II disorder: Secondary | ICD-10-CM | POA: Diagnosis not present

## 2015-05-14 DIAGNOSIS — R7611 Nonspecific reaction to tuberculin skin test without active tuberculosis: Secondary | ICD-10-CM | POA: Diagnosis not present

## 2015-05-14 DIAGNOSIS — Z227 Latent tuberculosis: Secondary | ICD-10-CM

## 2015-05-14 LAB — COMPLETE METABOLIC PANEL WITH GFR
ALT: 12 U/L (ref 6–29)
AST: 13 U/L (ref 10–30)
Albumin: 3.7 g/dL (ref 3.6–5.1)
Alkaline Phosphatase: 41 U/L (ref 33–115)
BILIRUBIN TOTAL: 0.2 mg/dL (ref 0.2–1.2)
BUN: 15 mg/dL (ref 7–25)
CALCIUM: 9.4 mg/dL (ref 8.6–10.2)
CO2: 28 mmol/L (ref 20–31)
Chloride: 105 mmol/L (ref 98–110)
Creat: 0.88 mg/dL (ref 0.50–1.10)
GFR, Est African American: >89 mL/min (ref >=60)
GFR, Est Non African American: 85 mL/min (ref >=60)
GLUCOSE: 99 mg/dL (ref 65–99)
Potassium: 4.2 mmol/L (ref 3.5–5.3)
Sodium: 139 mmol/L (ref 135–146)
Total Protein: 6.8 g/dL (ref 6.1–8.1)

## 2015-05-14 MED ORDER — VITAMIN B-6 50 MG PO TABS: 50.0000 mg | ORAL_TABLET | Freq: Every day | ORAL | Status: DC

## 2015-05-14 MED ORDER — ONDANSETRON 8 MG PO TBDP: 8.0000 mg | ORAL_TABLET | Freq: Three times a day (TID) | ORAL | Status: DC | PRN

## 2015-05-14 MED ORDER — RIFAPENTINE 150 MG PO TABS: 900.0000 mg | ORAL_TABLET | ORAL | Status: DC

## 2015-05-14 MED ORDER — ISONIAZID 300 MG PO TABS: 900.0000 mg | ORAL_TABLET | Freq: Every day | ORAL | Status: DC

## 2015-05-14 NOTE — Progress Notes (Signed)
RFV: latent TB, with newly positive PPD,also has RA Subjective:    Patient ID: Elizabeth Pope, female    DOB: [birth year], 35 y.o.   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 She previously had negative PPD screen until 2013. She denied having any respiratory symptoms but had not been offered to take LTBI. She has been seeing Dr. Estanislado Pandy for RA and is now being evaluated for starting Enbrel vs. Methotrexate. This past week she was seen by Dr. Melvyn Novas in pulmonology for CXR finding of calcified pulmonary nodule who feels it is consistent with prior granulomatous disease? gohn complex. She was referred to [facility] by Dr. Melvyn Novas for best regimen for LTBI treatment  Other than healthcare occupational exposure, the patient denies working/volunteering at homeless shelters, prisons/jails, or traveling to endemic TB area. No family members with TB, though her mother is also a nurse who was treated for LTBI  Allergies  Allergen Reactions  . Vicodin [Hydrocodone-Acetaminophen] Itching   Current Outpatient Prescriptions on File Prior to Visit  Medication Sig Dispense Refill  . amLODipine (NORVASC) 5 MG tablet Take 1 tablet by mouth daily.  5  . folic acid (FOLVITE) 1 MG tablet Take 2 tablets by mouth daily.  1  . lamoTRIgine (LAMICTAL) 25 MG tablet Take 2 tablets by mouth daily.  1  . PAZEO 0.7 % SOLN 1 drop daily.  3  . predniSONE (DELTASONE) 5 MG tablet Take 2 tablets by mouth daily.  0  . RESTASIS 0.05 % ophthalmic emulsion 1 drop twice daily  3  . traZODone (DESYREL) 50 MG tablet Take 100 mg by mouth at bedtime.  1   No current facility-administered medications on file prior to visit.   Active Ambulatory Problems    Diagnosis Date Noted  . Solitary pulmonary nodule 05/04/2015  . Cigarette smoker 05/05/2015  . Chest pain 05/05/2015   Resolved Ambulatory Problems    Diagnosis Date Noted  . No Resolved Ambulatory  Problems   Past Medical History  Diagnosis Date  . Migraine   . Depression   . Anxiety    Social History  Substance Use Topics  . Smoking status: Current Every Day Smoker -- 0.50 packs/day for 19 years    Types: Cigarettes  . Smokeless tobacco: Never Used  . Alcohol Use: 0.0 oz/week    0 Standard drinks or equivalent per week     Comment: occ  - not currently working  family history includes Bronchitis in her daughter; Rheum arthritis in her maternal aunt, maternal grandmother, and mother.   Review of Systems  Constitutional: Negative for fever, chills, diaphoresis, activity change, appetite change, fatigue and unexpected weight change.  HENT: Negative for congestion, sore throat, rhinorrhea, sneezing, trouble swallowing and sinus pressure.  Eyes: Negative for photophobia and visual disturbance.  Respiratory: Negative for cough, chest tightness, shortness of breath, wheezing and stridor.  Cardiovascular: Negative for chest pain, palpitations and leg swelling.  Gastrointestinal: Negative for nausea, vomiting, abdominal pain, diarrhea, constipation, blood in stool, abdominal distention and anal bleeding.  Genitourinary: Negative for dysuria, hematuria, flank pain and difficulty urinating.  Musculoskeletal: + joint pain Skin: Negative for color change, pallor, rash and wound.  Neurological: Negative for dizziness, tremors, weakness and light-headedness.  Hematological: Negative for adenopathy. Does not bruise/bleed easily.  Psychiatric/Behavioral: Negative for behavioral problems, confusion, sleep disturbance, dysphoric mood, decreased concentration and agitation.  Objective:   Physical Exam BP 120/85 mmHg  Pulse 77  Temp(Src) 98.1 F (36.7 C) (Oral)  Wt 160 lb (72.576 kg)  LMP 04/30/2015 (Approximate) Physical Exam  Constitutional: He is oriented to person, place, and time. He appears well-developed and well-nourished. No distress.  HENT:  Mouth/Throat: Oropharynx  is clear and moist. No oropharyngeal exudate.  Cardiovascular: Normal rate, regular rhythm and normal heart sounds. Exam reveals no gallop and no friction rub.  No murmur heard.  Pulmonary/Chest: Effort normal and breath sounds normal. No respiratory distress. He has no wheezes.  Abdominal: Soft. Bowel sounds are normal. He exhibits no distension. There is no tenderness.  Lymphadenopathy:  He has no cervical adenopathy.  Neurological: He is alert and oriented to person, place, and time.  Skin: Skin is warm and dry. No rash noted. No erythema.  Psychiatric: He has a normal mood and affect. His behavior is normal.          Assessment & Plan:  Latent tb treatment = can do short course of ltbi treatment with 12 weekly doses of INH 900mg   with rifapentine 900mg   plus vit b6 at 50mg  daily. Will get baseline cmp. Gave instructions how to take meds weekly,  making sure she gives [place] updates weekly of taking meds. Also gave rx for zofran.  Have her return in 4-5 wk to check on CMP ensure she is tolerating medication.  If intolerance, then we will switch to 4 month course of rifampin.  Ok to start enbrel in 2 months time  Cc. deveshwar and wert

## 2015-06-26 ENCOUNTER — Encounter: Payer: Self-pay | Admitting: Internal Medicine

## 2015-06-26 ENCOUNTER — Ambulatory Visit (INDEPENDENT_AMBULATORY_CARE_PROVIDER_SITE_OTHER): Payer: Medicaid Other | Admitting: Internal Medicine

## 2015-06-26 VITALS — BP 126/88 | HR 71 | Temp 98.0°F | Ht 63.0 in | Wt 159.0 lb

## 2015-06-26 DIAGNOSIS — B20 Human immunodeficiency virus [HIV] disease: Secondary | ICD-10-CM

## 2015-06-26 DIAGNOSIS — Z227 Latent tuberculosis: Secondary | ICD-10-CM

## 2015-06-26 DIAGNOSIS — R7611 Nonspecific reaction to tuberculin skin test without active tuberculosis: Secondary | ICD-10-CM | POA: Diagnosis present

## 2015-06-26 LAB — COMPLETE METABOLIC PANEL WITH GFR
ALK PHOS: 43 U/L (ref 33–115)
ALT: 15 U/L (ref 6–29)
AST: 11 U/L (ref 10–30)
Albumin: 3.9 g/dL (ref 3.6–5.1)
BILIRUBIN TOTAL: 0.2 mg/dL (ref 0.2–1.2)
BUN: 13 mg/dL (ref 7–25)
CALCIUM: 9.3 mg/dL (ref 8.6–10.2)
CO2: 23 mmol/L (ref 20–31)
CREATININE: 0.88 mg/dL (ref 0.50–1.10)
Chloride: 104 mmol/L (ref 98–110)
GFR, Est Non African American: 85 mL/min (ref >=60)
Glucose, Bld: 90 mg/dL (ref 65–99)
Potassium: 4.5 mmol/L (ref 3.5–5.3)
Sodium: 136 mmol/L (ref 135–146)
TOTAL PROTEIN: 6.9 g/dL (ref 6.1–8.1)

## 2015-06-26 MED ORDER — RIFAPENTINE 150 MG PO TABS: 900.0000 mg | ORAL_TABLET | ORAL | Status: DC

## 2015-06-26 MED ORDER — VITAMIN B-6 50 MG PO TABS: 50.0000 mg | ORAL_TABLET | Freq: Every day | ORAL | Status: DC

## 2015-06-26 NOTE — Progress Notes (Signed)
RFV: LTBI treatment Subjective:    Patient ID: Elizabeth Pope, female    DOB: [birth year], 35 y.o.   MRN: [id number]  HPI  Elizabeth Pope is a 35yo F taking 12 weekly doses INH/rifapentin of LTBI treatment.  Doing well now but had initial confusion during first week when she took 2 doses in 1 wk. Unclear that her vitamin b6 rx was cancelled. We have asked her to take it now to prevent any peripheral neuropathy.  Allergies  Allergen Reactions  . Vicodin [Hydrocodone-Acetaminophen] Itching   Current Outpatient Prescriptions on File Prior to Visit  Medication Sig Dispense Refill  . amLODipine (NORVASC) 5 MG tablet Take 1 tablet by mouth daily.  5  . folic acid (FOLVITE) 1 MG tablet Take 2 tablets by mouth daily.  1  . isoniazid (NYDRAZID) 300 MG tablet Take 3 tablets (900 mg total) by mouth daily. 36 tablet 0  . lamoTRIgine (LAMICTAL) 25 MG tablet Take 2 tablets by mouth daily.  1  . ondansetron (ZOFRAN-ODT) 8 MG disintegrating tablet Take 1 tablet (8 mg total) by mouth every 8 (eight) hours as needed for nausea or vomiting. 20 tablet 0  . PAZEO 0.7 % SOLN 1 drop daily.  3  . predniSONE (DELTASONE) 5 MG tablet Take 2 tablets by mouth daily.  0  . RESTASIS 0.05 % ophthalmic emulsion 1 drop twice daily  3  . traZODone (DESYREL) 50 MG tablet Take 100 mg by mouth at bedtime.  1   No current facility-administered medications on file prior to visit.   Active Ambulatory Problems    Diagnosis Date Noted  . Solitary pulmonary nodule 05/04/2015  . Cigarette smoker 05/05/2015  . Chest pain 05/05/2015  . 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 Bipolar 2 disorder ([place]) 05/14/2015   Resolved Ambulatory Problems    Diagnosis Date Noted  . No Resolved Ambulatory Problems   Past Medical History  Diagnosis Date  . Migraine   . Depression   . Anxiety    Social History  Substance Use Topics  . Smoking status: Current Every Day Smoker -- 0.50 packs/day for 19 years    Types: Cigarettes  . Smokeless  tobacco: Never Used     Comment: cutting back  . Alcohol Use: 0.0 oz/week    0 Standard drinks or equivalent per week     Comment: occ     Review of Systems  Constitutional: Negative for fever, chills, diaphoresis, activity change, appetite change, fatigue and unexpected weight change.  HENT: Negative for congestion, sore throat, rhinorrhea, sneezing, trouble swallowing and sinus pressure.  Eyes: Negative for photophobia and visual disturbance.  Respiratory: Negative for cough, chest tightness, shortness of breath, wheezing and stridor.  Cardiovascular: Negative for chest pain, palpitations and leg swelling.  Gastrointestinal: Negative for nausea, vomiting, abdominal pain, diarrhea, constipation, blood in stool, abdominal distention and anal bleeding.  Genitourinary: Negative for dysuria, hematuria, flank pain and difficulty urinating.  Musculoskeletal: Negative for myalgias, back pain, joint swelling, arthralgias and gait problem.  Skin: Negative for color change, pallor, rash and wound.  Neurological: Negative for dizziness, tremors, weakness and light-headedness.  Hematological: Negative for adenopathy. Does not bruise/bleed easily.  Psychiatric/Behavioral: Negative for behavioral problems, confusion, sleep disturbance, dysphoric mood, decreased concentration and agitation.       Objective:   Physical Exam BP 126/88 mmHg  Pulse 71  Temp(Src) 98 F (36.7 C) (Oral)  Ht 5\' 3"  (1.6 m)  Wt 159 lb (72.122 kg)  BMI 28.17 kg/m2  Physical  Exam  Constitutional:  oriented to person, place, and time. appears well-developed and well-nourished. No distress.  HENT: [place]/AT, PERRLA, no scleral icterus Mouth/Throat: Oropharynx is clear and moist. No oropharyngeal exudate.  Cardiovascular: Normal rate, regular rhythm and normal heart sounds. Exam reveals no gallop and no friction rub.  No murmur heard.  Pulmonary/Chest: Effort normal and breath sounds normal. No respiratory distress.  has no  wheezes.  Neck = supple, no nuchal rigidity Abdominal: Soft. Bowel sounds are normal.  exhibits no distension. There is no tenderness.  Lymphadenopathy: no cervical adenopathy. No axillary adenopathy Neurological: alert and oriented to person, place, and time.  Skin: Skin is warm and dry. No rash noted. No erythema.  Psychiatric: a normal mood and affect.  behavior is normal.         Assessment & Plan:  - continue with weekly doses on Wednesday x 6 more weeks. Will check cmp today - need to start taking daily vitamin b6  Cc: Elizabeth Pope

## 2015-07-31 ENCOUNTER — Other Ambulatory Visit: Payer: Self-pay | Admitting: Internal Medicine

## 2015-07-31 DIAGNOSIS — Z227 Latent tuberculosis: Secondary | ICD-10-CM

## 2015-07-31 MED ORDER — ISONIAZID 300 MG PO TABS: 900.0000 mg | ORAL_TABLET | Freq: Once | ORAL | Status: DC

## 2015-07-31 MED ORDER — RIFAPENTINE 150 MG PO TABS: 600.0000 mg | ORAL_TABLET | Freq: Once | ORAL | Status: DC

## 2015-07-31 NOTE — Progress Notes (Signed)
Error in taking meds. Now topping off rx to finish last dose for LtBI

## 2015-08-01 ENCOUNTER — Telehealth: Payer: Self-pay | Admitting: *Deleted

## 2015-08-01 NOTE — Telephone Encounter (Signed)
Pharmacy had a question about rifapentine rx sent 07/31/15.  RX reads "Please dispense full prescription."  Pharmacy also found that the medication comes in a foil pouch and the patient will need to be charged for the full 32 tablets due to opening the foil pouch.  Dr. Baxter Flattery please clarify the prescription.  RN will call the pharmacy with your response.

## 2015-08-27 ENCOUNTER — Encounter: Payer: Self-pay | Admitting: Internal Medicine

## 2015-08-27 ENCOUNTER — Ambulatory Visit (INDEPENDENT_AMBULATORY_CARE_PROVIDER_SITE_OTHER): Payer: Medicaid Other | Admitting: Internal Medicine

## 2015-08-27 VITALS — BP 116/80 | HR 70 | Temp 98.7°F | Wt 150.5 lb

## 2015-08-27 DIAGNOSIS — R7611 Nonspecific reaction to tuberculin skin test without active tuberculosis: Secondary | ICD-10-CM | POA: Diagnosis present

## 2015-08-27 DIAGNOSIS — Z227 Latent tuberculosis: Secondary | ICD-10-CM

## 2015-08-27 NOTE — Progress Notes (Signed)
RFV: latent tb infection Subjective:    Patient ID: Elizabeth Pope, female    DOB: Apr 20, 1980, 35 y.o.   MRN: [id number]  HPI 35yo F with ltbi, finished 12 weekly doses of treatment without difficulty. Currently on methotrexate for psoriasis. She reports that she did well with taking the medications and did not have significant side effects  Allergies  Allergen Reactions  . Vicodin [Hydrocodone-Acetaminophen] Itching   Current Outpatient Prescriptions on File Prior to Visit  Medication Sig Dispense Refill  . folic acid (FOLVITE) 1 MG tablet Take 2 tablets by mouth daily.  1  . hydrALAZINE (APRESOLINE) 10 MG tablet Take 10 mg by mouth 2 (two) times daily as needed.    . lamoTRIgine (LAMICTAL) 25 MG tablet Take 2 tablets by mouth daily.  1  . ondansetron (ZOFRAN-ODT) 8 MG disintegrating tablet Take 1 tablet (8 mg total) by mouth every 8 (eight) hours as needed for nausea or vomiting. 20 tablet 0  . PAZEO 0.7 % SOLN 1 drop daily.  3  . pyridOXINE (VITAMIN B-6) 50 MG tablet Take 1 tablet (50 mg total) by mouth daily. 60 tablet 0  . RESTASIS 0.05 % ophthalmic emulsion 1 drop twice daily  3  . traZODone (DESYREL) 50 MG tablet Take 100 mg by mouth at bedtime.  1  . amLODipine (NORVASC) 5 MG tablet Take 1 tablet by mouth daily. Reported on 08/27/2015  5   No current facility-administered medications on file prior to visit.   Active Ambulatory Problems    Diagnosis Date Noted  . Solitary pulmonary nodule 05/04/2015  . Cigarette smoker 05/05/2015  . Chest pain 05/05/2015  . 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 Bipolar 2 disorder ([place]) 05/14/2015   Resolved Ambulatory Problems    Diagnosis Date Noted  . No Resolved Ambulatory Problems   Past Medical History  Diagnosis Date  . Migraine   . Depression   . Anxiety       Review of Systems  Constitutional: Negative for fever, chills, diaphoresis, activity change, appetite change, fatigue and unexpected weight change.  HENT: Negative  for congestion, sore throat, rhinorrhea, sneezing, trouble swallowing and sinus pressure.  Eyes: Negative for photophobia and visual disturbance.  Respiratory: Negative for cough, chest tightness, shortness of breath, wheezing and stridor.  Cardiovascular: Negative for chest pain, palpitations and leg swelling.  Gastrointestinal: Negative for nausea, vomiting, abdominal pain, diarrhea, constipation, blood in stool, abdominal distention and anal bleeding.  Genitourinary: Negative for dysuria, hematuria, flank pain and difficulty urinating.  Musculoskeletal: Negative for myalgias, back pain, joint swelling, arthralgias and gait problem.  Skin: Negative for color change, pallor, rash and wound.  Neurological: Negative for dizziness, tremors, weakness and light-headedness.  Hematological: Negative for adenopathy. Does not bruise/bleed easily.  Psychiatric/Behavioral: Negative for behavioral problems, confusion, sleep disturbance, dysphoric mood, decreased concentration and agitation.       Objective:   Physical Exam BP 116/80 mmHg  Pulse 70  Temp(Src) 98.7 F (37.1 C)  Wt 150 lb 8 oz (68.266 kg) Physical Exam  Constitutional:  oriented to person, place, and time. appears well-developed and well-nourished. No distress.  HENT: /AT, PERRLA, no scleral icterus Mouth/Throat: Oropharynx is clear and moist. No oropharyngeal exudate.  Cardiovascular: Normal rate, regular rhythm and normal heart sounds. Exam reveals no gallop and no friction rub.  No murmur heard.  Pulmonary/Chest: Effort normal and breath sounds normal. No respiratory distress.  has no wheezes.  Neck = supple, no nuchal rigidity Abdominal: Soft. Bowel sounds are  normal.  exhibits no distension. There is no tenderness.  Lymphadenopathy: no cervical adenopathy. No axillary adenopathy Neurological: alert and oriented to person, place, and time.  Skin: Skin is warm and dry. No rash noted. No erythema.  Psychiatric: a normal mood  and affect.  behavior is normal.         Assessment & Plan:  Latent tb infection = she has completed the 12 weekly doses for rifapentine and inh. For future testing to tb exposure, would recommend checking for symptoms and chest xray. She is cleared to go onto use of biologics if needed.

## 2015-11-09 LAB — CBC AND DIFFERENTIAL
HEMATOCRIT: 40 % (ref 36–46)
Hemoglobin: 13.5 g/dL (ref 12.0–16.0)
PLATELETS: 326 10*3/uL (ref 150–399)
WBC: 12.3 10^3/mL

## 2015-11-09 LAB — BASIC METABOLIC PANEL
BUN: 16 mg/dL (ref 4–21)
CREATININE: 1.1 mg/dL (ref 0.5–1.1)
Glucose: 85 mg/dL
Potassium: 4 mmol/L (ref 3.4–5.3)
SODIUM: 137 mmol/L (ref 137–147)

## 2015-11-09 LAB — HEPATIC FUNCTION PANEL
ALK PHOS: 40 U/L (ref 25–125)
ALT: 20 U/L (ref 7–35)
AST: 13 U/L (ref 13–35)
BILIRUBIN, TOTAL: 0.3 mg/dL

## 2015-12-25 ENCOUNTER — Encounter: Payer: Self-pay | Admitting: *Deleted

## 2015-12-25 DIAGNOSIS — M19071 Primary osteoarthritis, right ankle and foot: Secondary | ICD-10-CM

## 2015-12-25 DIAGNOSIS — M2241 Chondromalacia patellae, right knee: Secondary | ICD-10-CM | POA: Insufficient documentation

## 2015-12-25 DIAGNOSIS — K219 Gastro-esophageal reflux disease without esophagitis: Secondary | ICD-10-CM

## 2015-12-25 DIAGNOSIS — M19041 Primary osteoarthritis, right hand: Secondary | ICD-10-CM

## 2015-12-25 DIAGNOSIS — M19072 Primary osteoarthritis, left ankle and foot: Secondary | ICD-10-CM

## 2015-12-25 DIAGNOSIS — M19042 Primary osteoarthritis, left hand: Secondary | ICD-10-CM

## 2015-12-25 DIAGNOSIS — G43909 Migraine, unspecified, not intractable, without status migrainosus: Secondary | ICD-10-CM | POA: Insufficient documentation

## 2015-12-25 DIAGNOSIS — I10 Essential (primary) hypertension: Secondary | ICD-10-CM

## 2015-12-25 DIAGNOSIS — M2242 Chondromalacia patellae, left knee: Secondary | ICD-10-CM

## 2015-12-25 DIAGNOSIS — M5030 Other cervical disc degeneration, unspecified cervical region: Secondary | ICD-10-CM

## 2015-12-25 DIAGNOSIS — M069 Rheumatoid arthritis, unspecified: Secondary | ICD-10-CM

## 2015-12-25 HISTORY — DX: Migraine, unspecified, not intractable, without status migrainosus: G43.909

## 2015-12-25 HISTORY — DX: Essential (primary) hypertension: I10

## 2015-12-25 HISTORY — DX: Other cervical disc degeneration, unspecified cervical region: M50.30

## 2015-12-25 HISTORY — DX: Rheumatoid arthritis, unspecified: M06.9

## 2015-12-25 HISTORY — DX: Gastro-esophageal reflux disease without esophagitis: K21.9

## 2015-12-25 HISTORY — DX: Primary osteoarthritis, right ankle and foot: M19.072

## 2015-12-25 HISTORY — DX: Primary osteoarthritis, right ankle and foot: M19.071

## 2015-12-25 HISTORY — DX: Primary osteoarthritis, right hand: M19.041

## 2015-12-25 NOTE — Progress Notes (Signed)
*  IMAGE* Office Visit Note  Patient: Elizabeth Pope             Date of Birth: August 18, 1980           MRN: [id number]             PCP: Andria Frames, MD Referring: Kristie Cowman, MD Visit Date: 12/26/2015 Occupation:@GUAROCC @    Subjective:  Pain bilateral hands   History of Present Illness: Darrielle Schoening is a 35 y.o. female with rheumatoid arthritis. She complains of having pain and discomfort in her bilateral hands and bilateral knee joints.   No Rheumatology ROS completed.    Objective: Vital Signs: There were no vitals taken for this visit.   Physical Exam not performed  CDAI Exam: No CDAI exam completed.    Investigation: Findings:  11/09/2015 CMP creatinine 1.13, GFR 63, CBC WBC 12.3, 2/17TB: Negative, hepatitis negative, HIV negative, SPEP normal, immunoglobulins normal, chest x-ray calcified right apical solitary nodule Pneumococcal vaccine February 2017    Imaging: [place] Extrem Up Bilat Comp  Result Date: 12/26/2015 Ultrasound examination of bilateral hands was performed per your recommendation using 12 MHz transducer grayscale and power Doppler bilateral second third and fifth MCP joints and bilateral wrist joints both  dorsal aspects were evaluated. The findings were she had no synovitis or synovial thickening in her MCPs or wrists joints. Her right median nerve was 0.07 cm squares and left median nerve was 0.06 m squares, which were within normal limits. Impression: The ultrasound evaluation today did not show any synovitis or tenosynovitis. The median nerve were within normal limits. Patient was advised to continue current treatment.    Speciality Comments: No specialty comments available.    Procedures:  No procedures performed Allergies: Vicodin [hydrocodone-acetaminophen]      OFollow-Up Instructions: Return in about 4 months (around 04/24/2016) for Rheumatoid arthritis.   Bo Merino, MD

## 2015-12-26 ENCOUNTER — Inpatient Hospital Stay (INDEPENDENT_AMBULATORY_CARE_PROVIDER_SITE_OTHER): Payer: Medicaid Other

## 2015-12-26 ENCOUNTER — Ambulatory Visit: Payer: Medicaid Other | Admitting: Rheumatology

## 2015-12-26 DIAGNOSIS — F17210 Nicotine dependence, cigarettes, uncomplicated: Secondary | ICD-10-CM

## 2015-12-26 DIAGNOSIS — M19041 Primary osteoarthritis, right hand: Secondary | ICD-10-CM

## 2015-12-26 DIAGNOSIS — M19072 Primary osteoarthritis, left ankle and foot: Secondary | ICD-10-CM

## 2015-12-26 DIAGNOSIS — M0579 Rheumatoid arthritis with rheumatoid factor of multiple sites without organ or systems involvement: Secondary | ICD-10-CM

## 2015-12-26 DIAGNOSIS — M19071 Primary osteoarthritis, right ankle and foot: Secondary | ICD-10-CM

## 2015-12-26 DIAGNOSIS — M19042 Primary osteoarthritis, left hand: Secondary | ICD-10-CM

## 2015-12-26 DIAGNOSIS — M79642 Pain in left hand: Secondary | ICD-10-CM | POA: Diagnosis not present

## 2015-12-26 DIAGNOSIS — M5030 Other cervical disc degeneration, unspecified cervical region: Secondary | ICD-10-CM

## 2015-12-26 DIAGNOSIS — M79641 Pain in right hand: Secondary | ICD-10-CM | POA: Diagnosis not present

## 2015-12-26 DIAGNOSIS — M2240 Chondromalacia patellae, unspecified knee: Secondary | ICD-10-CM

## 2015-12-26 DIAGNOSIS — Z79899 Other long term (current) drug therapy: Secondary | ICD-10-CM

## 2015-12-26 LAB — CBC WITH DIFFERENTIAL/PLATELET
BASOS PCT: 1 %
Basophils Absolute: 66 cells/uL (ref 0–200)
Eosinophils Absolute: 264 cells/uL (ref 15–500)
Eosinophils Relative: 4 %
HCT: 35.8 % (ref 35.0–45.0)
Hemoglobin: 12.4 g/dL (ref 11.7–15.5)
LYMPHS PCT: 36 %
Lymphs Abs: 2376 cells/uL (ref 850–3900)
MCH: 31.2 pg (ref 27.0–33.0)
MCHC: 34.6 g/dL (ref 32.0–36.0)
MCV: 89.9 fL (ref 80.0–100.0)
MONOS PCT: 7 %
MPV: 8.5 fL (ref 7.5–12.5)
Monocytes Absolute: 462 cells/uL (ref 200–950)
NEUTROS PCT: 52 %
Neutro Abs: 3432 cells/uL (ref 1500–7800)
PLATELETS: 355 10*3/uL (ref 140–400)
RBC: 3.98 MIL/uL (ref 3.80–5.10)
RDW: 17.6 % — AB (ref 11.0–15.0)
WBC: 6.6 10*3/uL (ref 3.8–10.8)

## 2015-12-26 LAB — COMPLETE METABOLIC PANEL WITH GFR
ALT: 23 U/L (ref 6–29)
AST: 19 U/L (ref 10–30)
Albumin: 4.3 g/dL (ref 3.6–5.1)
Alkaline Phosphatase: 40 U/L (ref 33–115)
BUN: 11 mg/dL (ref 7–25)
CALCIUM: 9.7 mg/dL (ref 8.6–10.2)
CHLORIDE: 108 mmol/L (ref 98–110)
CO2: 23 mmol/L (ref 20–31)
CREATININE: 1 mg/dL (ref 0.50–1.10)
GFR, EST AFRICAN AMERICAN: 84 mL/min (ref >=60)
GFR, Est Non African American: 73 mL/min (ref >=60)
Glucose, Bld: 69 mg/dL (ref 65–99)
POTASSIUM: 4.2 mmol/L (ref 3.5–5.3)
Sodium: 139 mmol/L (ref 135–146)
Total Bilirubin: 0.3 mg/dL (ref 0.2–1.2)
Total Protein: 7.2 g/dL (ref 6.1–8.1)

## 2015-12-27 ENCOUNTER — Telehealth: Payer: Self-pay | Admitting: Radiology

## 2015-12-27 NOTE — Telephone Encounter (Signed)
I have called patient to advise labs are normal  

## 2016-01-07 ENCOUNTER — Encounter: Payer: Self-pay | Admitting: Radiology

## 2016-01-07 DIAGNOSIS — Z79899 Other long term (current) drug therapy: Secondary | ICD-10-CM

## 2016-01-22 ENCOUNTER — Other Ambulatory Visit: Payer: Self-pay | Admitting: Rheumatology

## 2016-01-24 ENCOUNTER — Telehealth: Payer: Self-pay | Admitting: Radiology

## 2016-01-24 NOTE — Telephone Encounter (Signed)
Patients disability forms are in the drawer for pick up, we can not complete most of the form, since they are functional capacity.   Once form fee is collected, let me know , I will call her

## 2016-01-28 ENCOUNTER — Ambulatory Visit: Payer: Self-pay | Admitting: Rheumatology

## 2016-01-29 NOTE — Telephone Encounter (Signed)
LMOM for patient to cb and pay form fee

## 2016-02-04 ENCOUNTER — Other Ambulatory Visit: Payer: Self-pay | Admitting: Rheumatology

## 2016-02-04 NOTE — Telephone Encounter (Signed)
Patient is requesting refill of methotrexate be sent to [facility] on [address].

## 2016-02-05 ENCOUNTER — Other Ambulatory Visit: Payer: Self-pay | Admitting: Rheumatology

## 2016-02-05 MED ORDER — METHOTREXATE 2.5 MG PO TABS: 20.0000 mg | ORAL_TABLET | ORAL | 0 refills | Status: DC

## 2016-02-05 NOTE — Telephone Encounter (Signed)
Last Visit: 11/09/15 Next Visit: 04/24/16 Labs: 12/26/15 WNL PLQ Eye Exam: 12/04/15 WNL  Okay to refill PLQ?

## 2016-02-05 NOTE — Telephone Encounter (Signed)
Last Visit: 11/09/15 Next Visit: 04/24/16 Labs: 12/26/15 WNL  Okay to refill MTX?

## 2016-03-17 ENCOUNTER — Telehealth: Payer: Self-pay | Admitting: Rheumatology

## 2016-03-17 NOTE — Telephone Encounter (Signed)
I have advised her to make sooner appointment. She has already d/c Plaquenil and feels better I have sent message to front desk to ask them to get her in sooner than she is scheduled

## 2016-03-17 NOTE — Telephone Encounter (Signed)
Patient states she is having several symptoms while taking plaquenil. She has had stomach cramps, diarrhea and her face is breaking out pretty bad. Patient stopped taking medication 2 days ago and the diarrhea and stomach cramps have stopped. Please call patient about symptoms.

## 2016-03-31 NOTE — Progress Notes (Deleted)
Office Visit Note  Patient: Elizabeth Pope             Date of Birth: 11-Oct-1980           MRN: [id number]             PCP: Andria Frames, MD Referring: Kristie Cowman, MD Visit Date: 04/01/2016 Occupation: _0 @    Subjective:  No chief complaint on file.   History of Present Illness: Elizabeth Pope is a 36 y.o. female ***   Activities of Daily Living:  Patient reports morning stiffness for *** {minute/hour:19697}.   Patient {ACTIONS;DENIES/REPORTS:21021675::"Denies"} nocturnal pain.  Difficulty dressing/grooming: {ACTIONS;DENIES/REPORTS:21021675::"Denies"} Difficulty climbing stairs: {ACTIONS;DENIES/REPORTS:21021675::"Denies"} Difficulty getting out of chair: {ACTIONS;DENIES/REPORTS:21021675::"Denies"} Difficulty using hands for taps, buttons, cutlery, and/or writing: {ACTIONS;DENIES/REPORTS:21021675::"Denies"}   No Rheumatology ROS completed.   PMFS History:  Patient Active Problem List   Diagnosis Date Noted  . High risk medication use 01/07/2016  . GERD (gastroesophageal reflux disease) 12/25/2015  . Migraines 12/25/2015  . Hypertension 12/25/2015  . RA (rheumatoid arthritis) ([place]) 12/25/2015  . DDD (degenerative disc disease), cervical 12/25/2015  . Osteoarthritis of both hands 12/25/2015  . 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/2015  . 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 Bipolar 2 disorder ([place]) 05/14/2015  . Cigarette smoker 05/05/2015  . Chest pain 05/05/2015  . Solitary pulmonary nodule 05/04/2015    Past Medical History:  Diagnosis Date  . Anxiety   . DDD (degenerative disc disease), cervical 12/25/2015  . Depression   . GERD (gastroesophageal reflux disease) 12/25/2015  . Hypertension 12/25/2015  . Migraine   . Migraines 12/25/2015  . 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/2015   mild  . Osteoarthritis of both hands 12/25/2015  . RA (rheumatoid arthritis) (HCC) 12/25/2015   Positive RF, Elevated ESR,  Positive CCP > 250     Family History  Problem Relation Age of Onset  . Bronchitis Daughter   . Allergies      "everybody"  . Rheum arthritis Mother   . Rheum arthritis Maternal Grandmother   . Rheum arthritis Maternal Aunt    Past Surgical History:  Procedure Laterality Date  . CESAREAN SECTION    . TUBAL LIGATION     Social History   Social History Narrative  . No narrative on file     Objective: Vital Signs: There were no vitals taken for this visit.   Physical Exam   Musculoskeletal Exam: ***  CDAI Exam: No CDAI exam completed.    Investigation: No additional findings. Office Visit on 12/26/2015  Component Date Value Ref Range Status  . WBC 12/26/2015 6.6  3.8 - 10.8 K/uL Final  . RBC 12/26/2015 3.98  3.80 - 5.10 MIL/uL Final  . Hemoglobin 12/26/2015 12.4  11.7 - 15.5 g/dL Final  . HCT 12/26/2015 35.8  35.0 - 45.0 % Final  . MCV 12/26/2015 89.9  80.0 - 100.0 fL Final  . MCH 12/26/2015 31.2  27.0 - 33.0 pg Final  . MCHC 12/26/2015 34.6  32.0 - 36.0 g/dL Final  . RDW 12/26/2015 17.6* 11.0 - 15.0 % Final  . Platelets 12/26/2015 355  140 - 400 K/uL Final  . MPV 12/26/2015 8.5  7.5 - 12.5 fL Final  . Neutro Abs 12/26/2015 3432  1,500 - 7,800 cells/uL Final  . Lymphs Abs 12/26/2015 2376  850 - 3,900 cells/uL Final  . Monocytes Absolute 12/26/2015 462  200 - 950 cells/uL Final  . Eosinophils Absolute 12/26/2015 264  15 -  500 cells/uL Final  . Basophils Absolute 12/26/2015 66  0 - 200 cells/uL Final  . Neutrophils Relative % 12/26/2015 52  % Final  . Lymphocytes Relative 12/26/2015 36  % Final  . Monocytes Relative 12/26/2015 7  % Final  . Eosinophils Relative 12/26/2015 4  % Final  . Basophils Relative 12/26/2015 1  % Final  . Smear Review 12/26/2015 Criteria for review not met   Final  . Sodium 12/26/2015 139  135 - 146 mmol/L Final  . Potassium 12/26/2015 4.2  3.5 - 5.3 mmol/L Final  . Chloride 12/26/2015 108  98 - 110 mmol/L Final  . CO2 12/26/2015 23  20  - 31 mmol/L Final  . Glucose, Bld 12/26/2015 69  65 - 99 mg/dL Final  . BUN 12/26/2015 11  7 - 25 mg/dL Final  . Creat 12/26/2015 1.00  0.50 - 1.10 mg/dL Final  . Total Bilirubin 12/26/2015 0.3  0.2 - 1.2 mg/dL Final  . Alkaline Phosphatase 12/26/2015 40  33 - 115 U/L Final  . AST 12/26/2015 19  10 - 30 U/L Final  . ALT 12/26/2015 23  6 - 29 U/L Final  . Total Protein 12/26/2015 7.2  6.1 - 8.1 g/dL Final  . Albumin 12/26/2015 4.3  3.6 - 5.1 g/dL Final  . Calcium 12/26/2015 9.7  8.6 - 10.2 mg/dL Final  . GFR, Est African American 12/26/2015 84  >=60 mL/min Final  . GFR, Est Non African American 12/26/2015 73  >=60 mL/min Final  Abstract on 12/25/2015  Component Date Value Ref Range Status  . Hemoglobin 11/09/2015 13.5  12.0 - 16.0 g/dL Final  . HCT 11/09/2015 40  36 - 46 % Final  . Platelets 11/09/2015 326  150 - 399 K/L Final  . WBC 11/09/2015 12.3  10^3/mL Final  . Glucose 11/09/2015 85  mg/dL Final  . BUN 11/09/2015 16  4 - 21 mg/dL Final  . Creatinine 11/09/2015 1.1  0.5 - 1.1 mg/dL Final  . Potassium 11/09/2015 4.0  3.4 - 5.3 mmol/L Final  . Sodium 11/09/2015 137  137 - 147 mmol/L Final  . Alkaline Phosphatase 11/09/2015 40  25 - 125 U/L Final  . ALT 11/09/2015 20  7 - 35 U/L Final  . AST 11/09/2015 13  13 - 35 U/L Final  . Bilirubin, Total 11/09/2015 0.3  mg/dL Final     Imaging: No results found.  Speciality Comments: No specialty comments available.    Procedures:  No procedures performed Allergies: Vicodin [hydrocodone-acetaminophen]   Assessment / Plan:     Visit Diagnoses: Rheumatoid arthritis involving multiple sites with positive rheumatoid factor (HCC)  High risk medication use - Apr 01, 2016==> plq 200am 100qhs;   Primary osteoarthritis of both feet  Primary osteoarthritis of both hands  DDD (degenerative disc disease), cervical    Orders: No orders of the defined types were placed in this encounter.  No orders of the defined types were placed in  this encounter.   Face-to-face time spent with patient was 30 minutes. 50% of time was spent in counseling and coordination of care.  Follow-Up Instructions: Return in about 5 months (around 08/29/2016).   Eliezer Lofts, PA-C  Note - This record has been created using [employer].  Chart creation errors have been sought, but may not always  have been located. Such creation errors do not reflect on  the standard of medical care.

## 2016-04-01 ENCOUNTER — Encounter: Payer: Medicaid Other | Admitting: Rheumatology

## 2016-04-11 ENCOUNTER — Telehealth (INDEPENDENT_AMBULATORY_CARE_PROVIDER_SITE_OTHER): Payer: Self-pay | Admitting: Radiology

## 2016-04-11 NOTE — Telephone Encounter (Signed)
Patient had called and left voicemail on the triage phone. She is complaining of pain in her hands. She is wanting to know what medications she can take. Patient is requesting a call back at [phone].

## 2016-04-11 NOTE — Telephone Encounter (Signed)
Please advise 

## 2016-04-14 ENCOUNTER — Telehealth: Payer: Self-pay | Admitting: Radiology

## 2016-04-14 MED ORDER — PREDNISONE 5 MG PO TABS: ORAL_TABLET | ORAL | 0 refills | Status: DC

## 2016-04-14 NOTE — Telephone Encounter (Signed)
Attempted to contact the patient and left message for patient to call the office.  

## 2016-04-14 NOTE — Telephone Encounter (Signed)
Patient left voicemail on Friday (I was out of the office) she has had pain with her hands for 2days and would like to know what she can do.

## 2016-04-14 NOTE — Addendum Note (Signed)
Addended by: Carole Binning on: 04/14/2016 02:03 PM   Modules accepted: Orders

## 2016-04-14 NOTE — Telephone Encounter (Signed)
See previous phone note.  

## 2016-04-14 NOTE — Telephone Encounter (Signed)
Based on the patient's symptoms and her past history of RA, she seems to be describing a flare.If patient feels that this is what's going on and she just needs a prednisone taper, we can prescribe the following.If she needs any additional help, she should let us know the exact symptoms she is having and see if we need to get her on a scheduled to see her  4po qAM x 5 days,  3po qAM x 5 days, 2po qAM x 5 days, 1po qAM x 5 days, 1/2po qAM x 5 days, then stop. 54 tablets w/ no refills.

## 2016-04-14 NOTE — Telephone Encounter (Signed)
Prescription sent to pharmacy and patient advised.

## 2016-04-14 NOTE — Telephone Encounter (Signed)
See above

## 2016-04-24 ENCOUNTER — Ambulatory Visit (INDEPENDENT_AMBULATORY_CARE_PROVIDER_SITE_OTHER): Payer: Medicaid Other | Admitting: Rheumatology

## 2016-04-24 ENCOUNTER — Ambulatory Visit: Payer: Medicaid Other | Admitting: Rheumatology

## 2016-04-24 ENCOUNTER — Encounter: Payer: Self-pay | Admitting: Rheumatology

## 2016-04-24 VITALS — BP 130/78 | HR 80 | Ht 63.0 in | Wt 156.0 lb

## 2016-04-24 DIAGNOSIS — K521 Toxic gastroenteritis and colitis: Secondary | ICD-10-CM

## 2016-04-24 DIAGNOSIS — M79642 Pain in left hand: Secondary | ICD-10-CM

## 2016-04-24 DIAGNOSIS — M79641 Pain in right hand: Secondary | ICD-10-CM | POA: Diagnosis not present

## 2016-04-24 DIAGNOSIS — M0579 Rheumatoid arthritis with rheumatoid factor of multiple sites without organ or systems involvement: Secondary | ICD-10-CM

## 2016-04-24 DIAGNOSIS — Z79899 Other long term (current) drug therapy: Secondary | ICD-10-CM | POA: Diagnosis not present

## 2016-04-24 LAB — CBC WITH DIFFERENTIAL/PLATELET
BASOS ABS: 0 {cells}/uL (ref 0–200)
Basophils Relative: 0 %
EOS ABS: 218 {cells}/uL (ref 15–500)
Eosinophils Relative: 2 %
HEMATOCRIT: 34.9 % — AB (ref 35.0–45.0)
Hemoglobin: 11.8 g/dL (ref 11.7–15.5)
LYMPHS PCT: 42 %
Lymphs Abs: 4578 cells/uL — ABNORMAL HIGH (ref 850–3900)
MCH: 30.2 pg (ref 27.0–33.0)
MCHC: 33.8 g/dL (ref 32.0–36.0)
MCV: 89.3 fL (ref 80.0–100.0)
MONO ABS: 654 {cells}/uL (ref 200–950)
MONOS PCT: 6 %
MPV: 8.5 fL (ref 7.5–12.5)
NEUTROS PCT: 50 %
Neutro Abs: 5450 cells/uL (ref 1500–7800)
PLATELETS: 319 10*3/uL (ref 140–400)
RBC: 3.91 MIL/uL (ref 3.80–5.10)
RDW: 15.7 % — AB (ref 11.0–15.0)
WBC: 10.9 10*3/uL — ABNORMAL HIGH (ref 3.8–10.8)

## 2016-04-24 LAB — COMPLETE METABOLIC PANEL WITH GFR
ALT: 45 U/L — AB (ref 6–29)
AST: 18 U/L (ref 10–30)
Albumin: 3.8 g/dL (ref 3.6–5.1)
Alkaline Phosphatase: 36 U/L (ref 33–115)
BILIRUBIN TOTAL: 0.3 mg/dL (ref 0.2–1.2)
BUN: 17 mg/dL (ref 7–25)
CHLORIDE: 107 mmol/L (ref 98–110)
CO2: 24 mmol/L (ref 20–31)
CREATININE: 1.03 mg/dL (ref 0.50–1.10)
Calcium: 9.3 mg/dL (ref 8.6–10.2)
GFR, EST AFRICAN AMERICAN: 81 mL/min (ref >=60)
GFR, Est Non African American: 70 mL/min (ref >=60)
Glucose, Bld: 84 mg/dL (ref 65–99)
Potassium: 3.9 mmol/L (ref 3.5–5.3)
Sodium: 140 mmol/L (ref 135–146)
TOTAL PROTEIN: 6.8 g/dL (ref 6.1–8.1)

## 2016-04-24 MED ORDER — METHOTREXATE SODIUM CHEMO INJECTION 50 MG/2ML: 20.0000 mg | INTRAMUSCULAR | 0 refills | Status: DC

## 2016-04-24 MED ORDER — "TUBERCULIN-ALLERGY SYRINGES 27G X 1/2"" 1 ML KIT": 1.0000 | PACK | 4 refills | Status: DC

## 2016-04-24 NOTE — Patient Instructions (Signed)
Standing Labs We placed an order today for your standing lab work.    Please come back and get your standing labs in June 2018 and every 3 months.    We have open lab Monday through Friday from 8:30-11:30 AM and 1:30-4 PM at the office of Dr. Shaili Deveshwar/Naitik Panwala, PA.   The office is located at [address], [address], [place], [place] [address] No appointment is necessary.   Labs are drawn by [facility].  You may receive a bill from [facility] for your lab work.     

## 2016-04-24 NOTE — Progress Notes (Signed)
This encounter was created in error - please disregard.

## 2016-04-24 NOTE — Progress Notes (Signed)
Office Visit Note  Patient: Elizabeth Pope             Date of Birth: Nov 04, 1980           MRN: [id number]             PCP: Andria Frames, MD Referring: Kristie Cowman, MD Visit Date: 04/24/2016 Occupation: '@GUAROCC'$ @    Subjective:  Follow-up Rheumatoid arthritis.  History of Present Illness: Elizabeth Pope is a 36 y.o. female   Had diarrhea with Plaquenil so patient stopped the medication in about January 2018 see form call notes for full details  Due to flare of her RA when she got off of the Plaquenil, we had to give her prednisone taper. We started the taper 04/14/2016. Patient is following to taper as written and she is now on 2 pills every morning for 5 days and will taper as requested    Activities of Daily Living:  Patient reports morning stiffness for 120 minutes.   Patient Reports nocturnal pain.  Difficulty dressing/grooming: Reports Difficulty climbing stairs: Reports Difficulty getting out of chair: Reports Difficulty using hands for taps, buttons, cutlery, and/or writing: Reports    Review of Systems  Constitutional: Negative for fatigue.  HENT: Negative for mouth sores and mouth dryness.   Eyes: Negative for dryness.  Respiratory: Negative for shortness of breath.   Gastrointestinal: Negative for constipation and diarrhea.  Musculoskeletal: Negative for myalgias and myalgias.  Skin: Negative for sensitivity to sunlight.  Psychiatric/Behavioral: Negative for decreased concentration and sleep disturbance.    PMFS History:  Patient Active Problem List   Diagnosis Date Noted  . High risk medication use 01/07/2016  . GERD (gastroesophageal reflux disease) 12/25/2015  . Migraines 12/25/2015  . Hypertension 12/25/2015  . RA (rheumatoid arthritis) ([place]) 12/25/2015  . DDD (degenerative disc disease), cervical 12/25/2015  . Osteoarthritis of both hands 12/25/2015  . 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/2015  . Chondromalacia of patella  12/25/2015  . 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 Bipolar 2 disorder ([place]) 05/14/2015  . Cigarette smoker 05/05/2015  . Chest pain 05/05/2015  . Solitary pulmonary nodule 05/04/2015    Past Medical History:  Diagnosis Date  . Anxiety   . DDD (degenerative disc disease), cervical 12/25/2015  . Depression   . GERD (gastroesophageal reflux disease) 12/25/2015  . Hypertension 12/25/2015  . Migraine   . Migraines 12/25/2015  . 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/2015   mild  . Osteoarthritis of both hands 12/25/2015  . RA (rheumatoid arthritis) ([facility]) 12/25/2015   Positive RF, Elevated ESR, Positive CCP > 250     Family History  Problem Relation Age of Onset  . Bronchitis Daughter   . Allergies      "everybody"  . Rheum arthritis Mother   . Rheum arthritis Maternal Grandmother   . Rheum arthritis Maternal Aunt    Past Surgical History:  Procedure Laterality Date  . CESAREAN SECTION    . TUBAL LIGATION     Social History   Social History Narrative  . No narrative on file     Objective: Vital Signs: BP 130/78   Pulse 80   Ht '5\' 3"'$  (1.6 m)   Wt 156 lb (70.8 kg)   LMP 04/03/2016 (Approximate)   BMI 27.63 kg/m    Physical Exam  Constitutional: She is oriented to person, place, and time. She appears well-developed and well-nourished.  HENT:  Head: Normocephalic and atraumatic.  Eyes: EOM are normal. Pupils are equal, round, and  reactive to light.  Cardiovascular: Normal rate, regular rhythm and normal heart sounds.  Exam reveals no gallop and no friction rub.   No murmur heard. Pulmonary/Chest: Effort normal and breath sounds normal. She has no wheezes. She has no rales.  Abdominal: Soft. Bowel sounds are normal. She exhibits no distension. There is no tenderness. There is no guarding. No hernia.  Musculoskeletal: Normal range of motion. She exhibits no edema, tenderness or deformity.  Lymphadenopathy:    She has no cervical adenopathy.  Neurological: She is alert and  oriented to person, place, and time. Coordination normal.  Skin: Skin is warm and dry. Capillary refill takes less than 2 seconds. No rash noted.  Psychiatric: She has a normal mood and affect. Her behavior is normal.  Nursing note and vitals reviewed.    Musculoskeletal Exam:  Full range of motion of all joints except unable to make a full fist formation. Due to pain and mild swelling of her MCP joints About 90 fist formation Fiber myalgia tender points are all absent except she does have bilateral trapezius muscle pain on palpation as well as medial aspect of bilateral knees. I do not consider the patient having fibromyalgia at this time but I will continue to monitor her in the future  CDAI Exam: CDAI Homunculus Exam:   Tenderness:  RUE: wrist LUE: wrist Right hand: 4th MCP, 3rd PIP and 4th PIP Left hand: 4th MCP and 5th MCP  Swelling:  RUE: wrist LUE: wrist Right hand: 4th MCP and 4th PIP Left hand: 4th MCP and 5th MCP  Joint Counts:  CDAI Tender Joint count: 7 CDAI Swollen Joint count: 6  Global Assessments:  Patient Global Assessment: 9 Provider Global Assessment: 9  CDAI Calculated Score: 31    Investigation: No additional findings. Office Visit on 12/26/2015  Component Date Value Ref Range Status  . WBC 12/26/2015 6.6  3.8 - 10.8 K/uL Final  . RBC 12/26/2015 3.98  3.80 - 5.10 MIL/uL Final  . Hemoglobin 12/26/2015 12.4  11.7 - 15.5 g/dL Final  . HCT 12/26/2015 35.8  35.0 - 45.0 % Final  . MCV 12/26/2015 89.9  80.0 - 100.0 fL Final  . MCH 12/26/2015 31.2  27.0 - 33.0 pg Final  . MCHC 12/26/2015 34.6  32.0 - 36.0 g/dL Final  . RDW 12/26/2015 17.6* 11.0 - 15.0 % Final  . Platelets 12/26/2015 355  140 - 400 K/uL Final  . MPV 12/26/2015 8.5  7.5 - 12.5 fL Final  . Neutro Abs 12/26/2015 3432  1,500 - 7,800 cells/uL Final  . Lymphs Abs 12/26/2015 2376  850 - 3,900 cells/uL Final  . Monocytes Absolute 12/26/2015 462  200 - 950 cells/uL Final  . Eosinophils  Absolute 12/26/2015 264  15 - 500 cells/uL Final  . Basophils Absolute 12/26/2015 66  0 - 200 cells/uL Final  . Neutrophils Relative % 12/26/2015 52  % Final  . Lymphocytes Relative 12/26/2015 36  % Final  . Monocytes Relative 12/26/2015 7  % Final  . Eosinophils Relative 12/26/2015 4  % Final  . Basophils Relative 12/26/2015 1  % Final  . Smear Review 12/26/2015 Criteria for review not met   Final  . Sodium 12/26/2015 139  135 - 146 mmol/L Final  . Potassium 12/26/2015 4.2  3.5 - 5.3 mmol/L Final  . Chloride 12/26/2015 108  98 - 110 mmol/L Final  . CO2 12/26/2015 23  20 - 31 mmol/L Final  . Glucose, Bld 12/26/2015 69  65 -  99 mg/dL Final  . BUN 12/26/2015 11  7 - 25 mg/dL Final  . Creat 12/26/2015 1.00  0.50 - 1.10 mg/dL Final  . Total Bilirubin 12/26/2015 0.3  0.2 - 1.2 mg/dL Final  . Alkaline Phosphatase 12/26/2015 40  33 - 115 U/L Final  . AST 12/26/2015 19  10 - 30 U/L Final  . ALT 12/26/2015 23  6 - 29 U/L Final  . Total Protein 12/26/2015 7.2  6.1 - 8.1 g/dL Final  . Albumin 12/26/2015 4.3  3.6 - 5.1 g/dL Final  . Calcium 12/26/2015 9.7  8.6 - 10.2 mg/dL Final  . GFR, Est African American 12/26/2015 84  >=60 mL/min Final  . GFR, Est Non African American 12/26/2015 73  >=60 mL/min Final  Abstract on 12/25/2015  Component Date Value Ref Range Status  . Hemoglobin 11/09/2015 13.5  12.0 - 16.0 g/dL Final  . HCT 11/09/2015 40  36 - 46 % Final  . Platelets 11/09/2015 326  150 - 399 K/L Final  . WBC 11/09/2015 12.3  10^3/mL Final  . Glucose 11/09/2015 85  mg/dL Final  . BUN 11/09/2015 16  4 - 21 mg/dL Final  . Creatinine 11/09/2015 1.1  0.5 - 1.1 mg/dL Final  . Potassium 11/09/2015 4.0  3.4 - 5.3 mmol/L Final  . Sodium 11/09/2015 137  137 - 147 mmol/L Final  . Alkaline Phosphatase 11/09/2015 40  25 - 125 U/L Final  . ALT 11/09/2015 20  7 - 35 U/L Final  . AST 11/09/2015 13  13 - 35 U/L Final  . Bilirubin, Total 11/09/2015 0.3  mg/dL Final     Imaging: No results  found.  Speciality Comments: No specialty comments available.    Procedures:  No procedures performed Allergies: Fluoxetine and Vicodin [hydrocodone-acetaminophen]   Assessment / Plan:     Visit Diagnoses: Rheumatoid arthritis involving multiple sites with positive rheumatoid factor ([place]) - Plan: CBC with Differential/Platelet, COMPLETE METABOLIC PANEL WITH GFR, COMPLETE METABOLIC PANEL WITH GFR, CBC with Differential/Platelet  High risk medications (not anticoagulants) long-term use - mtx 8/ wk ; folic 2/d; [stopped plq jan 2018 due to diarrhea] ; consider more aggressive tx if needed on next visit - Plan: CBC with Differential/Platelet, COMPLETE METABOLIC PANEL WITH GFR, COMPLETE METABOLIC PANEL WITH GFR, CBC with Differential/Platelet  Diarrhea due to drug - jan 2018 ==>Plaquenil caused diarrhea for patient  Bilateral hand pain    Orders: Orders Placed This Encounter  Procedures  . CBC with Differential/Platelet  . COMPLETE METABOLIC PANEL WITH GFR   Meds ordered this encounter  Medications  . methotrexate 50 MG/2ML injection    Sig: Inject 0.8 mLs (20 mg total) into the skin once a week.    Dispense:  9.6 mL    Refill:  0    Dispense 90 day supply with preservatives only    Order Specific Question:   Supervising Provider    Answer:   Bo Merino [[id number]]  . Tuberculin-Allergy Syringes 27G X 1/2" 1 ML KIT    Sig: Inject 1 Syringe into the skin once a week. If syringe size greater than 48m, please call me at my office.    Dispense:  12 each    Refill:  4    Order Specific Question:   Supervising Provider    Answer:   DLyda Perone  Plan: #1: Patient is flaring with her rheumatoid arthritis. She is on methotrexate 8 pills per week broken down to 4 pills on Wednesday and  4 pills on Friday. Taking folic acid 2 mg daily. She had to stop Plaquenil due to diarrhea. When she was taking both Plaquenil and methotrexate, she had adequate relief of her rheumatoid  arthritis. Ever since she stop the Plaquenil she is flared. I've given her prednisone taper about 04/13/2016. She continues to take the prednisone but is starting to have pain as she is tapering down off of her prednisone.  As a result, I will convert her from methotrexate pills to methotrexate injectable in hopes of having more adequate response from methotrexate. I'll bring her back in 2 months to monitor how well done. If she doesn't do as well as expected, we will have to consider dual therapy Biologics.  #2: Prescriptions were written for methotrexate injectable as well as tuberculin syringe is  #3: Dr. Koleen Nimrod discussed the proper techniques for methotrexate injections.  #4: CBC with differential, CMP with GFR every 3 months starting today   Face-to-face time spent with patient was 30 minutes. 50% of time was spent in counseling and coordination of care.  Follow-Up Instructions: Return in about 5 months (around 09/24/2016).   Eliezer Lofts, PA-C  Patient has synovitis on examination today we'll try to switch her to subcutaneous methotrexate. If she had inadequate response at follow-up visit then we'll consider Biologics. I examined and evaluated the patient with Eliezer Lofts PA. The plan of care was discussed as noted above.  Bo Merino, MD Note - This record has been created using Editor, commissioning.  Chart creation errors have been sought, but may not always  have been located. Such creation errors do not reflect on  the standard of medical care.

## 2016-04-24 NOTE — Progress Notes (Signed)
Pharmacy Note  Subjective: Patient presents today to the [facility] to see Mr. Carlyon Shadow.  Patient is currently taking methotrexate 8 tablets weekly and folic acid 2 mg daily.  Patient seen by the pharmacist for counseling on injectable methotrexate.    Objective: CBC    Component Value Date/Time   WBC 6.6 12/26/2015 1326   RBC 3.98 12/26/2015 1326   HGB 12.4 12/26/2015 1326   HCT 35.8 12/26/2015 1326   PLT 355 12/26/2015 1326   MCV 89.9 12/26/2015 1326   MCH 31.2 12/26/2015 1326   MCHC 34.6 12/26/2015 1326   RDW 17.6 (H) 12/26/2015 1326   LYMPHSABS 2,376 12/26/2015 1326   MONOABS 462 12/26/2015 1326   EOSABS 264 12/26/2015 1326   BASOSABS 66 12/26/2015 1326   CMP     Component Value Date/Time   NA 139 12/26/2015 1326   NA 137 11/09/2015   K 4.2 12/26/2015 1326   CL 108 12/26/2015 1326   CO2 23 12/26/2015 1326   GLUCOSE 69 12/26/2015 1326   BUN 11 12/26/2015 1326   BUN 16 11/09/2015   CREATININE 1.00 12/26/2015 1326   CALCIUM 9.7 12/26/2015 1326   PROT 7.2 12/26/2015 1326   ALBUMIN 4.3 12/26/2015 1326   AST 19 12/26/2015 1326   ALT 23 12/26/2015 1326   ALKPHOS 40 12/26/2015 1326   BILITOT 0.3 12/26/2015 1326   GFRNONAA 73 12/26/2015 1326   GFRAA 84 12/26/2015 1326   Assessment/Plan:  Reviewed the purpose, proper use, and adverse effects of methotrexate.  Reviewed instructions with patient to take methotrexate 0.8 mL weekly along with folic acid 2 mg daily.  Educated patient on how to use a vial and syringe and reviewed injection technique with patient.  Patient was able to demonstrate proper technique for injections using vial and syringe.  Provided patient on educational material regarding injection technique and storage of methotrexate.  Patient denies any questions or concerns regarding her medications at this time.    Elisabeth Most, Pharm.D., BCPS Clinical Pharmacist Pager: [phone] Phone: [phone] 04/24/2016 2:08 PM

## 2016-05-16 NOTE — Addendum Note (Signed)
Addended byEliezer Lofts on: 05/16/2016 03:34 PM   Modules accepted: Level of Service, SmartSet

## 2016-05-16 NOTE — Progress Notes (Signed)
This encounter was created in error - please disregard.

## 2016-06-16 ENCOUNTER — Telehealth: Payer: Self-pay | Admitting: *Deleted

## 2016-06-17 NOTE — Telephone Encounter (Signed)
error 

## 2016-06-24 ENCOUNTER — Encounter: Payer: Self-pay | Admitting: Obstetrics

## 2016-06-24 ENCOUNTER — Ambulatory Visit (INDEPENDENT_AMBULATORY_CARE_PROVIDER_SITE_OTHER): Payer: Medicaid Other | Admitting: Certified Nurse Midwife

## 2016-06-24 ENCOUNTER — Other Ambulatory Visit (HOSPITAL_COMMUNITY)
Admission: RE | Admit: 2016-06-24 | Discharge: 2016-06-24 | Disposition: A | Discharge status: Home / Self Care | Payer: Medicaid Other | Source: Ambulatory Visit | Attending: Obstetrics | Admitting: Obstetrics

## 2016-06-24 ENCOUNTER — Other Ambulatory Visit (HOSPITAL_COMMUNITY)
Admission: RE | Admit: 2016-06-24 | Discharge: 2016-06-24 | Disposition: A | Discharge status: Home / Self Care | Payer: Medicaid Other | Source: Ambulatory Visit | Attending: Certified Nurse Midwife | Admitting: Certified Nurse Midwife

## 2016-06-24 VITALS — BP 113/62 | HR 74 | Wt 155.9 lb

## 2016-06-24 DIAGNOSIS — Z9851 Tubal ligation status: Secondary | ICD-10-CM

## 2016-06-24 DIAGNOSIS — R350 Frequency of micturition: Secondary | ICD-10-CM

## 2016-06-24 DIAGNOSIS — Z01419 Encounter for gynecological examination (general) (routine) without abnormal findings: Secondary | ICD-10-CM | POA: Insufficient documentation

## 2016-06-24 DIAGNOSIS — R1030 Lower abdominal pain, unspecified: Secondary | ICD-10-CM

## 2016-06-24 DIAGNOSIS — Z0000 Encounter for general adult medical examination without abnormal findings: Secondary | ICD-10-CM | POA: Diagnosis not present

## 2016-06-24 DIAGNOSIS — Z113 Encounter for screening for infections with a predominantly sexual mode of transmission: Secondary | ICD-10-CM

## 2016-06-24 DIAGNOSIS — N939 Abnormal uterine and vaginal bleeding, unspecified: Secondary | ICD-10-CM

## 2016-06-24 DIAGNOSIS — R87810 Cervical high risk human papillomavirus (HPV) DNA test positive: Secondary | ICD-10-CM | POA: Insufficient documentation

## 2016-06-24 DIAGNOSIS — N946 Dysmenorrhea, unspecified: Secondary | ICD-10-CM

## 2016-06-24 NOTE — Progress Notes (Signed)
Patient states that she only has abdominal pain every now and again and is not that concerned, she would just like to get her annual done today.

## 2016-06-24 NOTE — Progress Notes (Signed)
Subjective:        Elizabeth Pope is a 36 y.o. female here for a routine annual exam. She is a gravida 3 para 3 .  She present for pap smear,genital and breast screen Patient has bleeding for 5 days,that is heavy mostly all of those days, but denies filling a pad an hour. The patient reprorts her menstrual cylce is approximately 30 days. Patient does complain of dysparenia during sexual intercouse but denies any abnormal discharge or foul odor. Patient does desire STD testing today. Patient does have right and lower quadrant pelvic pain about five times a month that is not usually associated with her menstrual cycle but denies intermenstrual bleeding. She reports her LMP on 06/08/2016. Patient does have an history of a c/s and btl in 2015.   Personal health questionnaire:  Is patient Ashkenazi Jewish, have a family history of breast and/or ovarian cancer: no Is there a family history of uterine cancer diagnosed at age < 73, gastrointestinal cancer, urinary tract cancer, family member who is a Field seismologist syndrome-associated carrier: no Is the patient overweight and hypertensive, family history of diabetes, personal history of gestational diabetes, preeclampsia or PCOS: no Is patient over 35, have PCOS,  family history of premature CHD under age 20, diabetes, smoke, have hypertension or peripheral artery disease:  no At any time, has a partner hit, kicked or otherwise hurt or frightened you?: no Over the past 2 weeks, have you felt down, depressed or hopeless?: no Over the past 2 weeks, have you felt little interest or pleasure in doing things?:no   Gynecologic History Patient's last menstrual period was 06/08/2016. Contraception: tubal ligation Last Pap:  2017. Results were: normal Last mammogram: n/a.  Obstetric History OB History  No data available    Past Medical History:  Diagnosis Date  . Anxiety   . DDD (degenerative disc disease), cervical 12/25/2015  . Depression   .  GERD (gastroesophageal reflux disease) 12/25/2015  . Hypertension 12/25/2015  . Migraine   . Migraines 12/25/2015  . 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/2015   mild  . Osteoarthritis of both hands 12/25/2015  . RA (rheumatoid arthritis) ([place]) 12/25/2015   Positive RF, Elevated ESR, Positive CCP > 250     Past Surgical History:  Procedure Laterality Date  . CESAREAN SECTION    . TUBAL LIGATION       Current Outpatient Prescriptions:  .  amLODipine (NORVASC) 5 MG tablet, Take 1 tablet by mouth daily. Reported on 08/27/2015, Disp: , Rfl: 5 .  folic acid (FOLVITE) 1 MG tablet, Take 2 tablets by mouth daily., Disp: , Rfl: 1 .  hydrALAZINE (APRESOLINE) 10 MG tablet, Take 10 mg by mouth 2 (two) times daily as needed., Disp: , Rfl:  .  hydrOXYzine (ATARAX/VISTARIL) 25 MG tablet, Take 25 mg by mouth 3 (three) times daily as needed., Disp: , Rfl:  .  lamoTRIgine (LAMICTAL) 100 MG tablet, TK 1 T PO D FOR MOOD, Disp: , Rfl: 2 .  lamoTRIgine (LAMICTAL) 25 MG tablet, Take 2 tablets by mouth daily., Disp: , Rfl: 1 .  pyridOXINE (VITAMIN B-6) 50 MG tablet, Take 1 tablet (50 mg total) by mouth daily., Disp: 60 tablet, Rfl: 0 .  sertraline (ZOLOFT) 50 MG tablet, Take 50 mg by mouth daily., Disp: , Rfl:  .  diphenhydrAMINE (BENADRYL) 25 MG tablet, Take 25 mg by mouth every 6 (six) hours as needed., Disp: , Rfl:  .  methotrexate 50 MG/2ML injection, Inject 0.8 mLs (  20 mg total) into the skin once a week., Disp: 9.6 mL, Rfl: 0 .  ondansetron (ZOFRAN-ODT) 8 MG disintegrating tablet, Take 1 tablet (8 mg total) by mouth every 8 (eight) hours as needed for nausea or vomiting., Disp: 20 tablet, Rfl: 0 .  PAZEO 0.7 % SOLN, 1 drop daily., Disp: , Rfl: 3 .  predniSONE (DELTASONE) 5 MG tablet, 4po qAM x 5 days,  3po qAM x 5 days, 2po qAM x 5 days, 1po qAM x 5 days, 1/2po qAM x 5 days, then stop., Disp: 54 tablet, Rfl: 0 .  RESTASIS 0.05 % ophthalmic emulsion, 1 drop twice daily, Disp: , Rfl: 3 .  traZODone (DESYREL) 50  MG tablet, Take 100 mg by mouth at bedtime., Disp: , Rfl: 1 .  Tuberculin-Allergy Syringes 27G X 1/2" 1 ML KIT, Inject 1 Syringe into the skin once a week. If syringe size greater than 37m, please call me at my office., Disp: 12 each, Rfl: 4 Allergies  Allergen Reactions  . Fluoxetine     Muscle spasms  . Plaquenil [Hydroxychloroquine] Rash  . Vicodin [Hydrocodone-Acetaminophen] Itching    Social History  Substance Use Topics  . Smoking status: Former Smoker    Packs/day: 0.50    Years: 19.00    Types: Cigarettes    Quit date: 01/24/2016  . Smokeless tobacco: Never Used     Comment: cutting back  . Alcohol use 0.0 oz/week     Comment: occ    Family History  Problem Relation Age of Onset  . Bronchitis Daughter   . Allergies      "everybody"  . Rheum arthritis Mother   . Rheum arthritis Maternal Grandmother   . Rheum arthritis Maternal Aunt       Review of Systems  Constitutional: negative for fatigue and weight loss Respiratory: negative for cough and wheezing Cardiovascular: negative for chest pain, fatigue and palpitations Gastrointestinal: negative for abdominal pain and change in bowel habits Musculoskeletal:negative for myalgias Neurological: negative for gait problems and tremors Behavioral/Psych: negative for abusive relationship, depression Endocrine: negative for temperature intolerance    Genitourinary:negative for abnormal menstrual periods, genital lesions, hot flashes, sexual problems and vaginal discharge Integument/breast: negative for breast lump, breast tenderness, nipple discharge and skin lesion(s)    Objective:       BP 113/62   Pulse 74   Wt 155 lb 14.4 oz (70.7 kg)   LMP 06/08/2016   BMI 27.62 kg/m  General:   alert  Skin:   no rash or abnormalities  Lungs:   clear to auscultation bilaterally  Heart:   regular rate and rhythm, S1, S2 normal, no murmur, click, rub or gallop  Breasts:   normal without suspicious masses, skin or nipple  changes or axillary nodes  Abdomen:  normal findings: no organomegaly, soft, non-tender and no hernia  Pelvis:  External genitalia: normal general appearance Urinary system: urethral meatus normal and bladder without fullness, nontender Vaginal: normal without tenderness, induration or masses Cervix: normal appearance Adnexa: normal bimanual exam Uterus: anteverted and non-tender, normal size   Lab Review Urine pregnancy test Labs reviewed yes Radiologic studies reviewed yes  50% of 30 min visit spent on counseling and coordination of care.    Assessment:    Healthy female exam.   Abnormal uterine bleeding (AUB) - Plan: CMP14+CBC/D/Plt+TSH, RPR, Hepatitis C antibody, Hepatitis B surface antigen, HIV antibody, [place]ag[place] Non-OB, [place] Complete  Dysmenorrhea - Plan: CMP14+CBC/D/Plt+TSH, RPR, Hepatitis C antibody, Hepatitis B surface antigen,  HIV antibody, US Transvaginal Non-OB, US Pelvis Complete  Lower abdominal pain - Plan: CMP14+CBC/D/Plt+TSH, RPR, Hepatitis C antibody, Hepatitis B surface antigen, HIV antibody, US Transvaginal Non-OB, US Pelvis Complete  Well woman exam - Plan: Cytology - PAP  Screen for STD (sexually transmitted disease) - Plan: Cervicovaginal ancillary only  Urinary frequency  H/O tubal ligation - Plan: US Transvaginal Non-OB, US Pelvis Complete   Plan:  1. Abnormal uterine bleeding (AUB)  - CMP14+CBC/D/Plt+TSH - RPR - Hepatitis C antibody - Hepatitis B surface antigen - HIV antibody - US Transvaginal Non-OB; Future - US Pelvis Complete; Future  2. Dysmenorrhea  - CMP14+CBC/D/Plt+TSH   3. Lower abdominal pain  - US Transvaginal Non-OB; Future - US Pelvis Complete; Future  4. Well woman exam  - Cytology - PAP  5. Screen for STD (sexually transmitted disease)  - Cervicovaginal ancillary only  6. Urinary frequency     Urine Dip.  [place]  7. H/O tubal ligation      - US Transvaginal Non-OB; Future - US Pelvis Complete;  Future   Followup in 2 to 4 weeks after u/s.   Meds ordered this encounter  Medications  . sertraline (ZOLOFT) 50 MG tablet    Sig: Take 50 mg by mouth daily.    Possible management options include: IUD Follow up 2-4 weeks after TUVS.

## 2016-06-25 ENCOUNTER — Ambulatory Visit (INDEPENDENT_AMBULATORY_CARE_PROVIDER_SITE_OTHER): Payer: Medicaid Other | Admitting: Rheumatology

## 2016-06-25 ENCOUNTER — Encounter: Payer: Self-pay | Admitting: Rheumatology

## 2016-06-25 VITALS — BP 116/74 | HR 80 | Resp 14 | Ht 64.0 in | Wt 156.0 lb

## 2016-06-25 DIAGNOSIS — M0579 Rheumatoid arthritis with rheumatoid factor of multiple sites without organ or systems involvement: Secondary | ICD-10-CM | POA: Diagnosis not present

## 2016-06-25 DIAGNOSIS — M25562 Pain in left knee: Secondary | ICD-10-CM

## 2016-06-25 DIAGNOSIS — M7122 Synovial cyst of popliteal space [Baker], left knee: Secondary | ICD-10-CM

## 2016-06-25 DIAGNOSIS — Z79899 Other long term (current) drug therapy: Secondary | ICD-10-CM

## 2016-06-25 LAB — CMP14+CBC/D/PLT+TSH
ALBUMIN: 3.9 g/dL (ref 3.5–5.5)
ALK PHOS: 41 IU/L (ref 39–117)
ALT: 17 IU/L (ref 0–32)
AST: 16 IU/L (ref 0–40)
Albumin/Globulin Ratio: 1.1 — ABNORMAL LOW (ref 1.2–2.2)
BUN / CREAT RATIO: 14 (ref 9–23)
BUN: 14 mg/dL (ref 6–20)
Basophils Absolute: 0 10*3/uL (ref 0.0–0.2)
Basos: 0 %
Bilirubin Total: 0.2 mg/dL (ref 0.0–1.2)
CALCIUM: 9.4 mg/dL (ref 8.7–10.2)
CO2: 24 mmol/L (ref 18–29)
Chloride: 100 mmol/L (ref 96–106)
Creatinine, Ser: 0.98 mg/dL (ref 0.57–1.00)
EOS (ABSOLUTE): 0.4 10*3/uL (ref 0.0–0.4)
EOS: 6 %
GFR calc Af Amer: 86 mL/min/{1.73_m2} (ref >59)
GFR, EST NON AFRICAN AMERICAN: 74 mL/min/{1.73_m2} (ref >59)
GLUCOSE: 90 mg/dL (ref 65–99)
Globulin, Total: 3.4 g/dL (ref 1.5–4.5)
HEMOGLOBIN: 11.8 g/dL (ref 11.1–15.9)
Hematocrit: 34.1 % (ref 34.0–46.6)
Immature Grans (Abs): 0 10*3/uL (ref 0.0–0.1)
Immature Granulocytes: 0 %
Lymphocytes Absolute: 2 10*3/uL (ref 0.7–3.1)
Lymphs: 34 %
MCH: 29.5 pg (ref 26.6–33.0)
MCHC: 34.6 g/dL (ref 31.5–35.7)
MCV: 85 fL (ref 79–97)
MONOS ABS: 0.5 10*3/uL (ref 0.1–0.9)
Monocytes: 9 %
Neutrophils Absolute: 3 10*3/uL (ref 1.4–7.0)
Neutrophils: 51 %
Platelets: 342 10*3/uL (ref 150–379)
Potassium: 4.1 mmol/L (ref 3.5–5.2)
RBC: 4 x10E6/uL (ref 3.77–5.28)
RDW: 15.2 % (ref 12.3–15.4)
Sodium: 139 mmol/L (ref 134–144)
TOTAL PROTEIN: 7.3 g/dL (ref 6.0–8.5)
TSH: 0.524 u[IU]/mL (ref 0.450–4.500)
WBC: 5.9 10*3/uL (ref 3.4–10.8)

## 2016-06-25 LAB — RPR: RPR: NONREACTIVE

## 2016-06-25 LAB — HEPATITIS C ANTIBODY

## 2016-06-25 LAB — HIV ANTIBODY (ROUTINE TESTING W REFLEX): HIV SCREEN 4TH GENERATION: NONREACTIVE

## 2016-06-25 LAB — HEPATITIS B SURFACE ANTIGEN: Hepatitis B Surface Ag: NEGATIVE

## 2016-06-25 MED ORDER — LIDOCAINE HCL 1 % IJ SOLN
2.0000 mL | INTRAMUSCULAR | Status: AC | PRN
  Administered 2016-06-25: 2 mL

## 2016-06-25 MED ORDER — TRIAMCINOLONE ACETONIDE 40 MG/ML IJ SUSP
40.0000 mg | INTRAMUSCULAR | Status: AC | PRN
  Administered 2016-06-25: 40 mg via INTRA_ARTICULAR

## 2016-06-25 NOTE — Progress Notes (Signed)
Office Visit Note  Patient: Elizabeth Pope             Date of Birth: [birth year]           MRN: [id number]             PCP: Andria Frames, MD Referring: Kristie Cowman, MD Visit Date: 06/25/2016 Occupation: _0 @    Subjective:  Pain of the Right Wrist; Pain of the Left Shoulder; Pain of the Left Knee; Joint Pain (legs painful); and Follow-up   History of Present Illness: Elizabeth Pope is a 36 y.o. female  Last seen April 24, 2016  c/o of left posterior knee pain; patient states that when she walks, it feels like her left knee will give out. This symptom has been going on for about a week. Patient does not think that she injured her knee.  Patient is also complaining of Right hand achy (right wrist) for last 3-4 days  Her Plaquenil caused her to have diarrhea. Therefore we switched her to methotrexate.   Activities of Daily Living:  Patient reports morning stiffness for 15 minute.   Patient Reports nocturnal pain.  Difficulty dressing/grooming: Reports Difficulty climbing stairs: Reports Difficulty getting out of chair: Reports Difficulty using hands for taps, buttons, cutlery, and/or writing: Denies   Review of Systems  Constitutional: Negative for fatigue.  HENT: Negative for mouth sores and mouth dryness.   Eyes: Negative for dryness.  Respiratory: Negative for shortness of breath.   Gastrointestinal: Negative for constipation and diarrhea.  Musculoskeletal: Negative for myalgias and myalgias.  Skin: Negative for sensitivity to sunlight.  Psychiatric/Behavioral: Negative for decreased concentration and sleep disturbance.    PMFS History:  Patient Active Problem List   Diagnosis Date Noted  . H/O tubal ligation 06/24/2016  . High risk medication use 01/07/2016  . GERD (gastroesophageal reflux disease) 12/25/2015  . Migraines 12/25/2015  . Hypertension 12/25/2015  . RA (rheumatoid arthritis) ([place]) 12/25/2015  . DDD (degenerative disc  disease), cervical 12/25/2015  . Osteoarthritis of both hands 12/25/2015  . 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/2015  . 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 Bipolar 2 disorder ([place]) 05/14/2015  . Cigarette smoker 05/05/2015  . Chest pain 05/05/2015  . Solitary pulmonary nodule 05/04/2015    Past Medical History:  Diagnosis Date  . Anxiety   . DDD (degenerative disc disease), cervical 12/25/2015  . Depression   . GERD (gastroesophageal reflux disease) 12/25/2015  . Hypertension 12/25/2015  . Migraine   . Migraines 12/25/2015  . 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/2015   mild  . Osteoarthritis of both hands 12/25/2015  . RA (rheumatoid arthritis) ([facility]) 12/25/2015   Positive RF, Elevated ESR, Positive CCP > 250     Family History  Problem Relation Age of Onset  . Bronchitis Daughter   . Allergies      "everybody"  . Rheum arthritis Mother   . Rheum arthritis Maternal Grandmother   . Rheum arthritis Maternal Aunt    Past Surgical History:  Procedure Laterality Date  . CESAREAN SECTION    . TUBAL LIGATION     Social History   Social History Narrative  . No narrative on file     Objective: Vital Signs: BP 116/74   Pulse 80   Resp 14   Ht 5' 4" (1.626 m)   Wt 156 lb (70.8 kg)   LMP 06/08/2016   BMI 26.78 kg/m    Physical Exam  Constitutional:  She is oriented to person, place, and time. She appears well-developed and well-nourished.  HENT:  Head: Normocephalic and atraumatic.  Eyes: EOM are normal. Pupils are equal, round, and reactive to light.  Cardiovascular: Normal rate, regular rhythm and normal heart sounds.  Exam reveals no gallop and no friction rub.   No murmur heard. Pulmonary/Chest: Effort normal and breath sounds normal. She has no wheezes. She has no rales.  Abdominal: Soft. Bowel sounds are normal. She exhibits no distension. There is no tenderness. There is no guarding. No hernia.  Musculoskeletal: Normal range of  motion. She exhibits no edema, tenderness or deformity.  Lymphadenopathy:    She has no cervical adenopathy.  Neurological: She is alert and oriented to person, place, and time. Coordination normal.  Skin: Skin is warm and dry. Capillary refill takes less than 2 seconds. No rash noted.  Psychiatric: She has a normal mood and affect. Her behavior is normal.  Nursing note and vitals reviewed.    Musculoskeletal Exam:  Full range of motion of all joints except decreased range of motion of left knee. Unable to flex the left knee secondary to pain and swelling behind the left knee Grip strength is equal and strong bilaterally Fiber myalgia tender points are all absent  CDAI Exam: CDAI Homunculus Exam:   Tenderness:  LLE: tibiofemoral  Swelling:  LLE: tibiofemoral  Joint Counts:  CDAI Tender Joint count: 1 CDAI Swollen Joint count: 1   No synovitis on examination Except left knee with Baker's cyst No associated warmth.  Investigation: No additional findings.  Office Visit on 06/24/2016  Component Date Value Ref Range Status  . Glucose 06/24/2016 90  65 - 99 mg/dL Final  . BUN 06/24/2016 14  6 - 20 mg/dL Final  . Creatinine, Ser 06/24/2016 0.98  0.57 - 1.00 mg/dL Final  . GFR calc non Af Amer 06/24/2016 74  >59 mL/min/1.73 Final  . GFR calc Af Amer 06/24/2016 86  >59 mL/min/1.73 Final  . BUN/Creatinine Ratio 06/24/2016 14  9 - 23 Final  . Sodium 06/24/2016 139  134 - 144 mmol/L Final  . Potassium 06/24/2016 4.1  3.5 - 5.2 mmol/L Final  . Chloride 06/24/2016 100  96 - 106 mmol/L Final  . CO2 06/24/2016 24  18 - 29 mmol/L Final  . Calcium 06/24/2016 9.4  8.7 - 10.2 mg/dL Final  . Total Protein 06/24/2016 7.3  6.0 - 8.5 g/dL Final  . Albumin 06/24/2016 3.9  3.5 - 5.5 g/dL Final  . Globulin, Total 06/24/2016 3.4  1.5 - 4.5 g/dL Final  . Albumin/Globulin Ratio 06/24/2016 1.1* 1.2 - 2.2 Final  . Bilirubin Total 06/24/2016 0.2  0.0 - 1.2 mg/dL Final  . Alkaline Phosphatase  06/24/2016 41  39 - 117 IU/L Final  . AST 06/24/2016 16  0 - 40 IU/L Final  . ALT 06/24/2016 17  0 - 32 IU/L Final  . TSH 06/24/2016 0.524  0.450 - 4.500 uIU/mL Final  . WBC 06/24/2016 5.9  3.4 - 10.8 x10E3/uL Final  . RBC 06/24/2016 4.00  3.77 - 5.28 x10E6/uL Final  . Hemoglobin 06/24/2016 11.8  11.1 - 15.9 g/dL Final  . Hematocrit 06/24/2016 34.1  34.0 - 46.6 % Final  . MCV 06/24/2016 85  79 - 97 fL Final  . MCH 06/24/2016 29.5  26.6 - 33.0 pg Final  . MCHC 06/24/2016 34.6  31.5 - 35.7 g/dL Final  . RDW 06/24/2016 15.2  12.3 - 15.4 % Final  . Platelets 06/24/2016  342  150 - 379 x10E3/uL Final  . Neutrophils 06/24/2016 51  Not Estab. % Final  . Lymphs 06/24/2016 34  Not Estab. % Final  . Monocytes 06/24/2016 9  Not Estab. % Final  . Eos 06/24/2016 6  Not Estab. % Final  . Basos 06/24/2016 0  Not Estab. % Final  . Neutrophils Absolute 06/24/2016 3.0  1.4 - 7.0 x10E3/uL Final  . Lymphocytes Absolute 06/24/2016 2.0  0.7 - 3.1 x10E3/uL Final  . Monocytes Absolute 06/24/2016 0.5  0.1 - 0.9 x10E3/uL Final  . EOS (ABSOLUTE) 06/24/2016 0.4  0.0 - 0.4 x10E3/uL Final  . Basophils Absolute 06/24/2016 0.0  0.0 - 0.2 x10E3/uL Final  . Immature Granulocytes 06/24/2016 0  Not Estab. % Final  . Immature Grans (Abs) 06/24/2016 0.0  0.0 - 0.1 x10E3/uL Final  . RPR Ser Ql 06/24/2016 Non Reactive  Non Reactive Final  . Hep C Virus Ab 06/24/2016 <0.1  0.0 - 0.9 s/co ratio Final   Comment:                                   Negative:     < 0.8                              Indeterminate: 0.8 - 0.9                                   Positive:     > 0.9  The CDC recommends that a positive HCV antibody result  be followed up with a HCV Nucleic Acid Amplification  test (450388).   . Hepatitis B Surface Ag 06/24/2016 Negative  Negative Final  . HIV Screen 4th Generation wRfx 06/24/2016 Non Reactive  Non Reactive Final  Office Visit on 04/24/2016  Component Date Value Ref Range Status  . Sodium 04/24/2016  140  135 - 146 mmol/L Final  . Potassium 04/24/2016 3.9  3.5 - 5.3 mmol/L Final  . Chloride 04/24/2016 107  98 - 110 mmol/L Final  . CO2 04/24/2016 24  20 - 31 mmol/L Final  . Glucose, Bld 04/24/2016 84  65 - 99 mg/dL Final  . BUN 04/24/2016 17  7 - 25 mg/dL Final  . Creat 04/24/2016 1.03  0.50 - 1.10 mg/dL Final  . Total Bilirubin 04/24/2016 0.3  0.2 - 1.2 mg/dL Final  . Alkaline Phosphatase 04/24/2016 36  33 - 115 U/L Final  . AST 04/24/2016 18  10 - 30 U/L Final  . ALT 04/24/2016 45* 6 - 29 U/L Final  . Total Protein 04/24/2016 6.8  6.1 - 8.1 g/dL Final  . Albumin 04/24/2016 3.8  3.6 - 5.1 g/dL Final  . Calcium 04/24/2016 9.3  8.6 - 10.2 mg/dL Final  . GFR, Est African American 04/24/2016 81  >=60 mL/min Final  . GFR, Est Non African American 04/24/2016 70  >=60 mL/min Final  . WBC 04/24/2016 10.9* 3.8 - 10.8 K/uL Final  . RBC 04/24/2016 3.91  3.80 - 5.10 MIL/uL Final  . Hemoglobin 04/24/2016 11.8  11.7 - 15.5 g/dL Final  . HCT 04/24/2016 34.9* 35.0 - 45.0 % Final  . MCV 04/24/2016 89.3  80.0 - 100.0 fL Final  . MCH 04/24/2016 30.2  27.0 - 33.0 pg Final  . MCHC 04/24/2016 33.8  32.0 - 36.0 g/dL Final  . RDW 04/24/2016 15.7* 11.0 - 15.0 % Final  . Platelets 04/24/2016 319  140 - 400 K/uL Final  . MPV 04/24/2016 8.5  7.5 - 12.5 fL Final  . Neutro Abs 04/24/2016 5450  1,500 - 7,800 cells/uL Final  . Lymphs Abs 04/24/2016 4578* 850 - 3,900 cells/uL Final  . Monocytes Absolute 04/24/2016 654  200 - 950 cells/uL Final  . Eosinophils Absolute 04/24/2016 218  15 - 500 cells/uL Final  . Basophils Absolute 04/24/2016 0  0 - 200 cells/uL Final  . Neutrophils Relative % 04/24/2016 50  % Final  . Lymphocytes Relative 04/24/2016 42  % Final  . Monocytes Relative 04/24/2016 6  % Final  . Eosinophils Relative 04/24/2016 2  % Final  . Basophils Relative 04/24/2016 0  % Final  . Smear Review 04/24/2016 Criteria for review not met   Final     Imaging: No results found.  Speciality  Comments: No specialty comments available.    Procedures:  Large Joint Inj Date/Time: 06/25/2016 2:56 PM Performed by: Eliezer Lofts Authorized by: Eliezer Lofts   Consent Given by:  Patient Site marked: the procedure site was marked   Timeout: prior to procedure the correct patient, procedure, and site was verified   Indications:  Pain and joint swelling Location:  Knee Site:  L knee Prep: patient was prepped and draped in usual sterile fashion   Needle Size:  27 G Needle Length:  1.5 inches Approach:  Medial Ultrasound Guidance: No   Fluoroscopic Guidance: No   Arthrogram: No   Medications:  2 mL lidocaine 1 %; 40 mg triamcinolone acetonide 40 MG/ML Aspiration Attempted: Yes   Patient tolerance:  Patient tolerated the procedure well with no immediate complications    Allergies: Fluoxetine; Plaquenil [hydroxychloroquine]; and Vicodin [hydrocodone-acetaminophen]   Assessment / Plan:     Visit Diagnoses: Rheumatoid arthritis involving multiple sites with positive rheumatoid factor ([place])  High risk medication use  Baker cyst, left - Injected with 40 mg of Kenalog mixed with 2 mL's 1% lidocaine  Acute pain of left knee   No synovitis noted on examination but we would like to establish the efficacy of methotrexate and I have asked the patient to come back in 3 months and we can do ultrasound of bilateral hands and wrist to rule out synovitis and check for the efficacy of methotrexate  Note: Since patient cannot recall injury to the left knee and she has a Baker's cyst, I would like to watch the patient carefully to see she is having swelling and inflammation secondary to rheumatoid arthritis affecting the left knee. At the next visit in 3 months, if the methotrexate is not working well and she has ongoing left knee problems, pain to her MCPs and wrist joints, and if she gets synovitis to any of her joints, we may want to consider switching methotrexate to another treatment  (i.e. Morrie Sheldon, or other Biologics) Note that she cannot tolerate Plaquenil  Patient states that she has adequate amount of methotrexate at home at this time. She states that she is using 1 small violent throwing it away. She wanted to confirm if this is the right way to use it. I advised her to speak with her pharmacist to make sure that that she have preservatives or preservatives in the methotrexate. If she has preservatives, she might be able to give herself a second injection the following week. She will coordinate that with her local pharmacist.  Patient wants disability form to be filled out by our office. Our office will review and determine her status as appropriate for her current RA condition.  Orders: Orders Placed This Encounter  Procedures  . Large Joint Injection/Arthrocentesis   No orders of the defined types were placed in this encounter.   Face-to-face time spent with patient was 30 minutes. 50% of time was spent in counseling and coordination of care.  Follow-Up Instructions: Return in about 3 months (around 09/25/2016) for RA,.   Eliezer Lofts, PA-C  Note - This record has been created using [employer].  Chart creation errors have been sought, but may not always  have been located. Such creation errors do not reflect on  the standard of medical care.

## 2016-06-26 LAB — CERVICOVAGINAL ANCILLARY ONLY
Bacterial vaginitis: POSITIVE — AB
CANDIDA VAGINITIS: NEGATIVE
CHLAMYDIA, DNA PROBE: NEGATIVE
NEISSERIA GONORRHEA: NEGATIVE
TRICH (WINDOWPATH): NEGATIVE

## 2016-06-27 ENCOUNTER — Other Ambulatory Visit: Payer: Self-pay | Admitting: Certified Nurse Midwife

## 2016-06-27 DIAGNOSIS — B9689 Other specified bacterial agents as the cause of diseases classified elsewhere: Secondary | ICD-10-CM

## 2016-06-27 DIAGNOSIS — N760 Acute vaginitis: Principal | ICD-10-CM

## 2016-06-27 LAB — CYTOLOGY - PAP
ADEQUACY: ABSENT
DIAGNOSIS: NEGATIVE
HPV (WINDOPATH): DETECTED — AB
HPV 16/18/45 GENOTYPING: NEGATIVE

## 2016-06-27 MED ORDER — METRONIDAZOLE 500 MG PO TABS: 500.0000 mg | ORAL_TABLET | Freq: Two times a day (BID) | ORAL | 0 refills | Status: DC

## 2016-07-01 ENCOUNTER — Telehealth: Payer: Self-pay | Admitting: *Deleted

## 2016-07-01 NOTE — Telephone Encounter (Signed)
Additional lab order was faxed to [facility] cytology for additional HPV testing that was not included at time of pap.

## 2016-07-02 ENCOUNTER — Ambulatory Visit (HOSPITAL_COMMUNITY): Payer: Medicaid Other

## 2016-07-06 ENCOUNTER — Other Ambulatory Visit: Payer: Self-pay | Admitting: Certified Nurse Midwife

## 2016-07-07 ENCOUNTER — Ambulatory Visit (HOSPITAL_COMMUNITY)
Admission: RE | Admit: 2016-07-07 | Discharge: 2016-07-07 | Disposition: A | Discharge status: Home / Self Care | Payer: Medicaid Other | Source: Ambulatory Visit | Attending: Certified Nurse Midwife | Admitting: Certified Nurse Midwife

## 2016-07-07 DIAGNOSIS — N946 Dysmenorrhea, unspecified: Secondary | ICD-10-CM

## 2016-07-07 DIAGNOSIS — D259 Leiomyoma of uterus, unspecified: Secondary | ICD-10-CM | POA: Insufficient documentation

## 2016-07-07 DIAGNOSIS — Z9851 Tubal ligation status: Secondary | ICD-10-CM | POA: Diagnosis not present

## 2016-07-07 DIAGNOSIS — N939 Abnormal uterine and vaginal bleeding, unspecified: Secondary | ICD-10-CM | POA: Diagnosis not present

## 2016-07-07 DIAGNOSIS — R1030 Lower abdominal pain, unspecified: Secondary | ICD-10-CM

## 2016-07-07 IMAGING — US US PELVIS COMPLETE
1 series · 15 of 25 positions shown · non-contrast
Comparison: CT abdomen and pelvis [DATE]

CLINICAL DATA: Abnormal uterine bleeding. Dysmenorrhea. Lower back
pain for 2 months. History of tubal ligation and C-section.

EXAM:
TRANSABDOMINAL AND TRANSVAGINAL ULTRASOUND OF PELVIS
TECHNIQUE: Both transabdominal and transvaginal ultrasound examinations of the
pelvis were performed. Transabdominal technique was performed for
global imaging of the pelvis including uterus, ovaries, adnexal
regions, and pelvic cul-de-sac. It was necessary to proceed with
endovaginal exam following the transabdominal exam to visualize the
uterus and endometrium.

[Series 1: us pelvis complete · 15 of 54 slices shown]
[im 1/54]
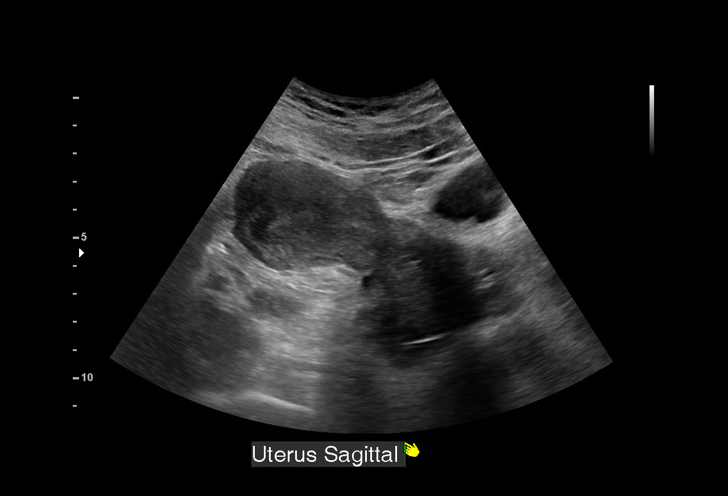
[im 5/54]
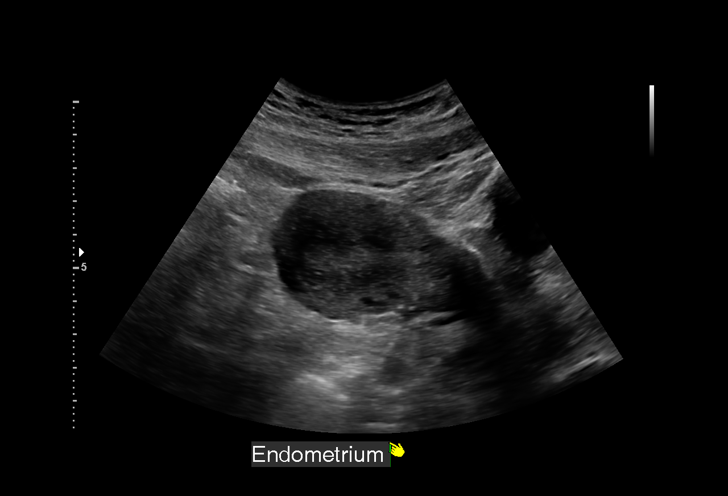
[im 9/54]
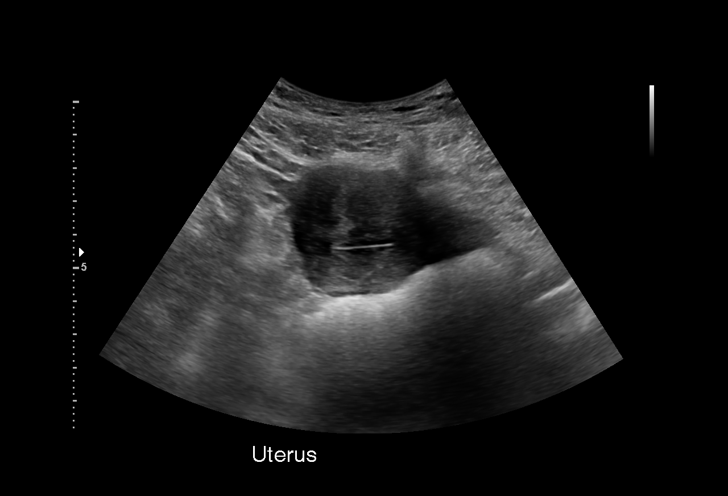
[im 12/54]
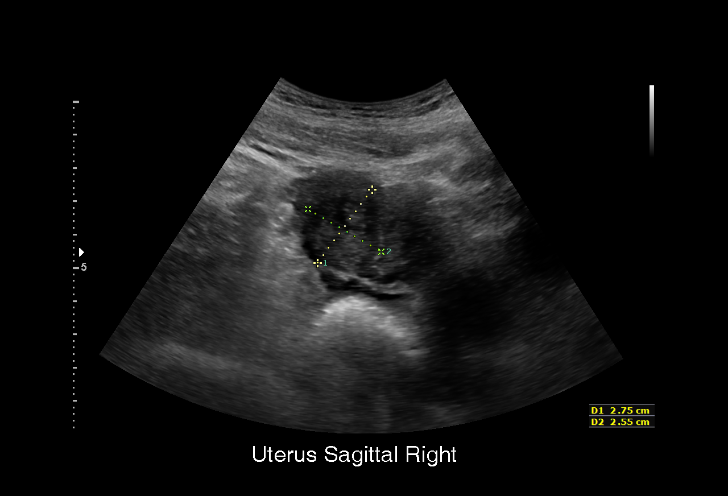
[im 16/54]
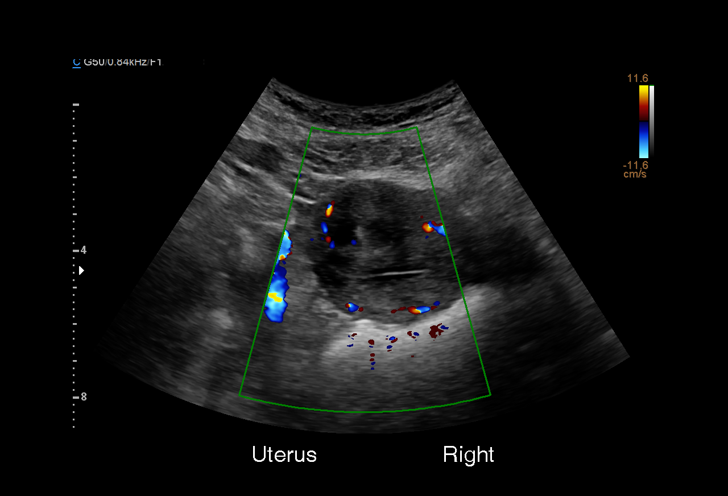
[im 20/54]
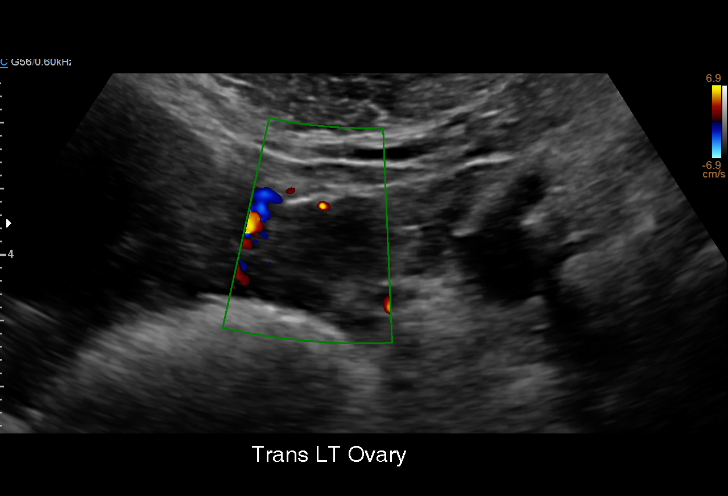
[im 23/54]
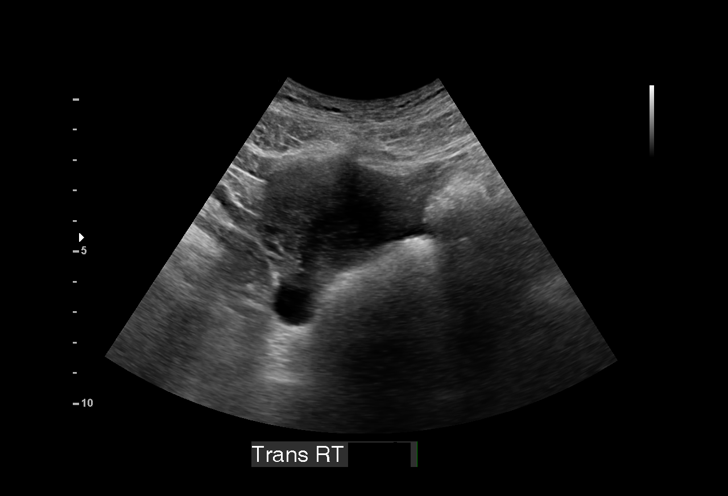
[im 27/54]
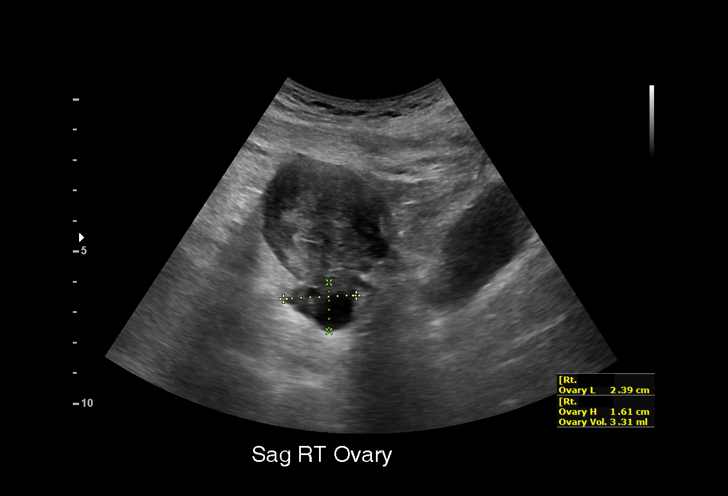
[im 31/54]
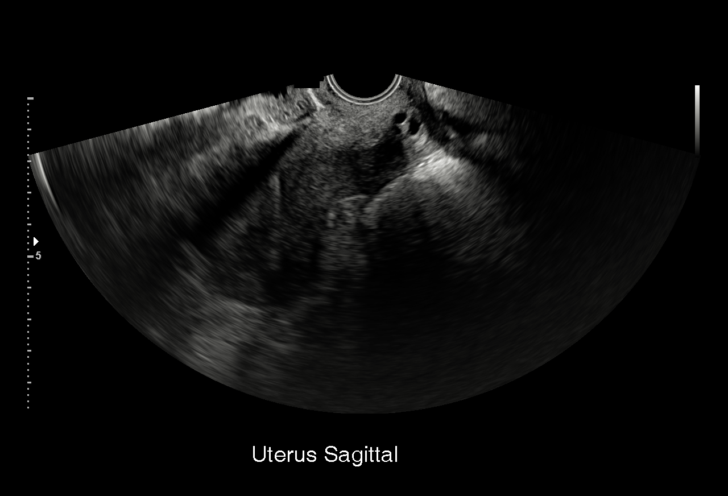
[im 34/54]
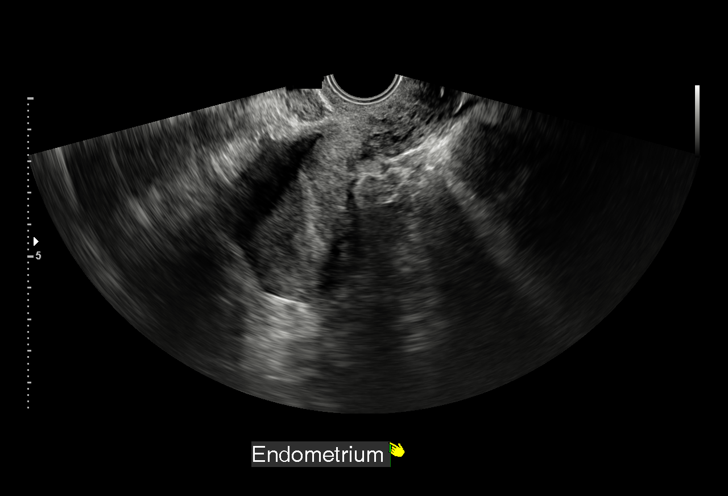
[im 38/54]
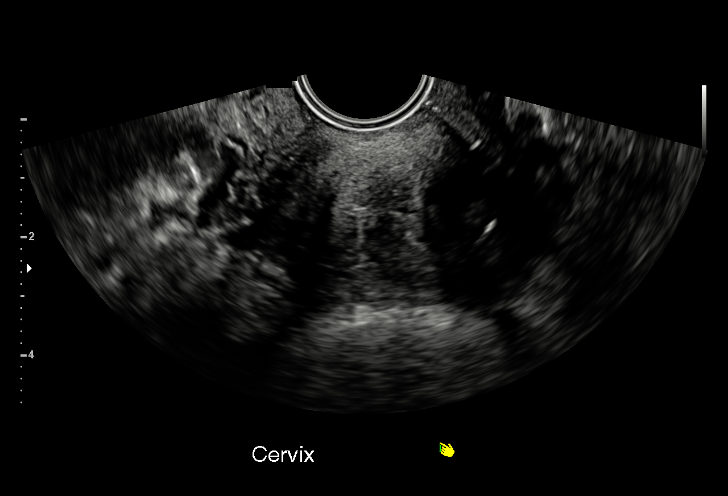
[im 42/54]
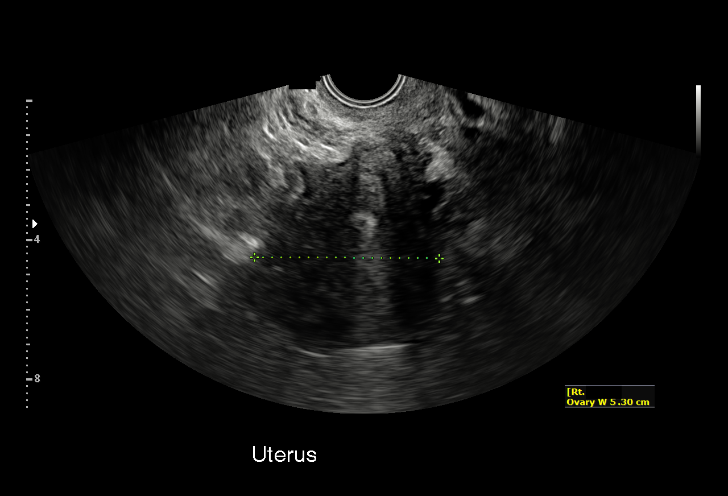
[im 45/54]
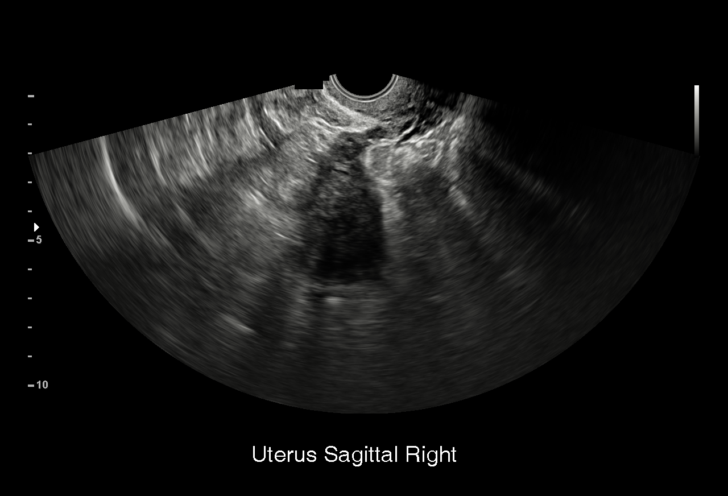
[im 49/54]
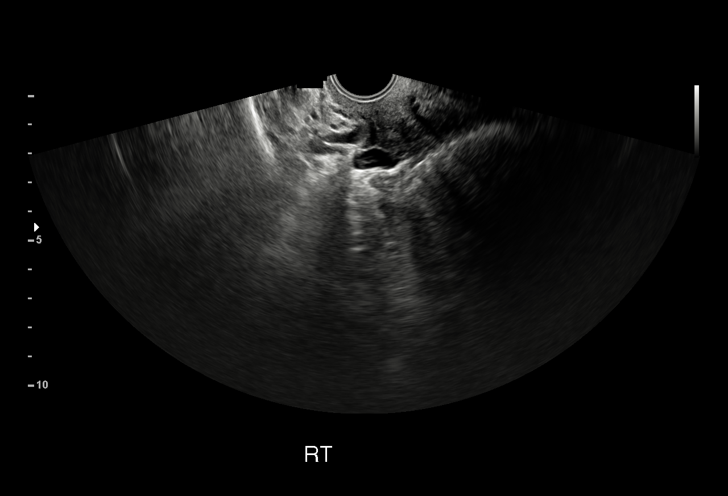
[im 54/54]
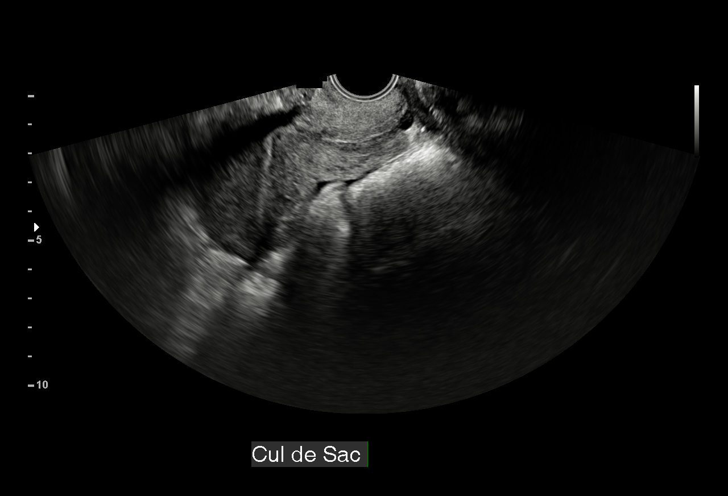

[15 of 25 positions shown; findings below may reference images not displayed]

FINDINGS: Uterus

Measurements: 10.0 x 3.8 x 4.2 cm. Small anterior subserosal fibroid
is 2.8 cm in diameter.

Endometrium

Thickness: 9.0 mm.  No focal abnormality visualized.

Right ovary

Measurements: 2.4 x 1.6 x 1.7 cm. Normal appearance/no adnexal mass.

Left ovary

Measurements: 1.8 x 1.2 x 2.1 cm. Normal appearance/no adnexal mass.

Other findings

Trace free pelvic fluid.
IMPRESSION: 1. Small anterior uterine fibroid.
2. Normal endometrial stripe. If bleeding remains unresponsive to
hormonal or medical therapy, sonohysterogram should be considered
for focal lesion work-up. (Ref: Radiological Reasoning: Algorithmic
Workup of Abnormal Vaginal Bleeding with Endovaginal Sonography and
Sonohysterography. AJR [4L]; 191:[id number])
3. Normal appearance of both ovaries.

## 2016-07-10 ENCOUNTER — Telehealth: Payer: Self-pay | Admitting: Rheumatology

## 2016-07-10 NOTE — Telephone Encounter (Signed)
Patient is requesting something for pain. She is experiencing pain in her right hip and states the pain is "new."

## 2016-07-11 NOTE — Telephone Encounter (Signed)
Patient states she is having pain in her right. Patient states is sharp, throbbing pain. Patient states she feels like it is going to give out on her if she puts any pressure on it. Patient states her right wrist is painful today. Patient states she is on MTX and is taking as prescribed. Patient advised to go to urgent care to be evaluated. Patient states "I will think about it."

## 2016-07-11 NOTE — Telephone Encounter (Signed)
Attempted to contact the patient and left message for patient to call the office.  

## 2016-08-15 ENCOUNTER — Other Ambulatory Visit: Payer: Self-pay | Admitting: Rheumatology

## 2016-08-15 ENCOUNTER — Other Ambulatory Visit: Payer: Self-pay

## 2016-08-15 NOTE — Telephone Encounter (Signed)
attempted to contact the patient and left message for patient to call the office.  

## 2016-08-15 NOTE — Telephone Encounter (Signed)
Patient states that she is having severe pain in her hands and in her wrists for a couple of weeks or about a month. Would like to know what she can do?  Stated that she is out of syringes and not sure if she has a Rx refill for MTX.  CB# is [phone].  Please Advise.  Thank You.

## 2016-08-18 MED ORDER — "TUBERCULIN-ALLERGY SYRINGES 27G X 1/2"" 1 ML KIT": 1.0000 | PACK | 4 refills | Status: DC

## 2016-08-18 NOTE — Telephone Encounter (Signed)
ok. Repeat labs this month

## 2016-08-18 NOTE — Telephone Encounter (Signed)
Patient states she has been hurting in her in fingers, wrists, toes and ankles as well as occasionally her ankle for the last several weeks. Patient states she has tried multiple things to relieve the pain and has had no success. Patient would like to know what she should do.    Last Visit: 04/24/16 Next Visit: 09/24/16  Okay to refill Syringes?

## 2016-08-18 NOTE — Addendum Note (Signed)
Addended by: Carole Binning on: 08/18/2016 10:26 AM   Modules accepted: Orders

## 2016-08-18 NOTE — Telephone Encounter (Signed)
Patient states she has been hurting in her in fingers, wrists, toes and ankles as well as occasionally her ankle for the last several weeks. Patient states she has tried multiple things to relieve the pain and has had no success. Patient would like to know what she should do.

## 2016-08-18 NOTE — Telephone Encounter (Signed)
ok 

## 2016-08-18 NOTE — Telephone Encounter (Signed)
Last Visit: 04/24/16 Next Visit: 09/24/16 Labs: 04/24/16 CMP with GFR is within normal limits except for elevated ALT at 45. Previously normal.   Okay to refill MTX ?

## 2016-08-26 ENCOUNTER — Telehealth: Payer: Self-pay | Admitting: *Deleted

## 2016-08-26 NOTE — Telephone Encounter (Signed)
Pt called to office earlier today, left no message.   Attempt to call pt. No answer, LM on VM to call office if needed.

## 2016-09-08 ENCOUNTER — Telehealth: Payer: Self-pay | Admitting: Rheumatology

## 2016-09-08 NOTE — Telephone Encounter (Signed)
Patient called stating that she has been hurting all day for the past couple of weeks and wants to know what she could do or does she need to come in to see Dr. Estanislado Pandy.

## 2016-09-09 ENCOUNTER — Ambulatory Visit (INDEPENDENT_AMBULATORY_CARE_PROVIDER_SITE_OTHER): Payer: Medicaid Other

## 2016-09-09 ENCOUNTER — Ambulatory Visit (INDEPENDENT_AMBULATORY_CARE_PROVIDER_SITE_OTHER): Payer: Medicaid Other | Admitting: Rheumatology

## 2016-09-09 ENCOUNTER — Encounter: Payer: Self-pay | Admitting: Rheumatology

## 2016-09-09 ENCOUNTER — Other Ambulatory Visit (INDEPENDENT_AMBULATORY_CARE_PROVIDER_SITE_OTHER): Payer: Self-pay | Admitting: Rheumatology

## 2016-09-09 ENCOUNTER — Ambulatory Visit (INDEPENDENT_AMBULATORY_CARE_PROVIDER_SITE_OTHER): Payer: Self-pay

## 2016-09-09 VITALS — BP 127/88 | HR 81 | Resp 16 | Ht 64.5 in | Wt 156.0 lb

## 2016-09-09 DIAGNOSIS — M79641 Pain in right hand: Secondary | ICD-10-CM

## 2016-09-09 DIAGNOSIS — M19042 Primary osteoarthritis, left hand: Secondary | ICD-10-CM

## 2016-09-09 DIAGNOSIS — R7611 Nonspecific reaction to tuberculin skin test without active tuberculosis: Secondary | ICD-10-CM

## 2016-09-09 DIAGNOSIS — M79642 Pain in left hand: Secondary | ICD-10-CM | POA: Diagnosis not present

## 2016-09-09 DIAGNOSIS — R911 Solitary pulmonary nodule: Secondary | ICD-10-CM

## 2016-09-09 DIAGNOSIS — M79672 Pain in left foot: Secondary | ICD-10-CM

## 2016-09-09 DIAGNOSIS — M19041 Primary osteoarthritis, right hand: Secondary | ICD-10-CM

## 2016-09-09 DIAGNOSIS — Z227 Latent tuberculosis: Secondary | ICD-10-CM

## 2016-09-09 DIAGNOSIS — M19072 Primary osteoarthritis, left ankle and foot: Secondary | ICD-10-CM

## 2016-09-09 DIAGNOSIS — M5030 Other cervical disc degeneration, unspecified cervical region: Secondary | ICD-10-CM | POA: Diagnosis not present

## 2016-09-09 DIAGNOSIS — Z79899 Other long term (current) drug therapy: Secondary | ICD-10-CM | POA: Diagnosis not present

## 2016-09-09 DIAGNOSIS — M79671 Pain in right foot: Secondary | ICD-10-CM

## 2016-09-09 DIAGNOSIS — F17210 Nicotine dependence, cigarettes, uncomplicated: Secondary | ICD-10-CM

## 2016-09-09 DIAGNOSIS — M0579 Rheumatoid arthritis with rheumatoid factor of multiple sites without organ or systems involvement: Secondary | ICD-10-CM | POA: Diagnosis not present

## 2016-09-09 DIAGNOSIS — M2241 Chondromalacia patellae, right knee: Secondary | ICD-10-CM | POA: Diagnosis not present

## 2016-09-09 DIAGNOSIS — M19071 Primary osteoarthritis, right ankle and foot: Secondary | ICD-10-CM

## 2016-09-09 DIAGNOSIS — M2242 Chondromalacia patellae, left knee: Secondary | ICD-10-CM

## 2016-09-09 LAB — COMPLETE METABOLIC PANEL WITH GFR
ALBUMIN: 4.1 g/dL (ref 3.6–5.1)
ALK PHOS: 49 U/L (ref 33–115)
ALT: 20 U/L (ref 6–29)
AST: 22 U/L (ref 10–30)
BILIRUBIN TOTAL: 0.3 mg/dL (ref 0.2–1.2)
BUN: 10 mg/dL (ref 7–25)
CALCIUM: 9.5 mg/dL (ref 8.6–10.2)
CO2: 23 mmol/L (ref 20–31)
Chloride: 104 mmol/L (ref 98–110)
Creat: 0.95 mg/dL (ref 0.50–1.10)
GFR, EST AFRICAN AMERICAN: 89 mL/min (ref >=60)
GFR, EST NON AFRICAN AMERICAN: 77 mL/min (ref >=60)
GLUCOSE: 65 mg/dL (ref 65–99)
POTASSIUM: 4.8 mmol/L (ref 3.5–5.3)
Sodium: 136 mmol/L (ref 135–146)
TOTAL PROTEIN: 7.5 g/dL (ref 6.1–8.1)

## 2016-09-09 LAB — CBC WITH DIFFERENTIAL/PLATELET
BASOS ABS: 0 {cells}/uL (ref 0–200)
Basophils Relative: 0 %
EOS ABS: 365 {cells}/uL (ref 15–500)
EOS PCT: 5 %
HEMATOCRIT: 37.5 % (ref 35.0–45.0)
Hemoglobin: 12.6 g/dL (ref 11.7–15.5)
LYMPHS ABS: 2555 {cells}/uL (ref 850–3900)
Lymphocytes Relative: 35 %
MCH: 28.2 pg (ref 27.0–33.0)
MCHC: 33.6 g/dL (ref 32.0–36.0)
MCV: 83.9 fL (ref 80.0–100.0)
MPV: 8.9 fL (ref 7.5–12.5)
Monocytes Absolute: 438 cells/uL (ref 200–950)
Monocytes Relative: 6 %
NEUTROS ABS: 3942 {cells}/uL (ref 1500–7800)
NEUTROS PCT: 54 %
Platelets: 373 10*3/uL (ref 140–400)
RBC: 4.47 MIL/uL (ref 3.80–5.10)
RDW: 16.4 % — ABNORMAL HIGH (ref 11.0–15.0)
WBC: 7.3 10*3/uL (ref 3.8–10.8)

## 2016-09-09 NOTE — Progress Notes (Signed)
Pharmacy Note Subjective: Patient presents today to the [facility] to see Dr. Estanislado Pandy.   Elizabeth Pope is a pleasant 36 yo F who presents for follow up of rheumatoid arthritis.  She is currently taking methotrexate 0.8 mL weekly and folic acid 2 mg daily.   Does the patient feel that his/her medications are working for him/her?  No Has the patient been experiencing any side effects to the medications prescribed?  No Does the patient have any problems obtaining medications?  No  Patient seen by the pharmacist for counseling on Humira.    Objective: TB Test: negative (05/04/15) Hepatitis panel: negative (03/29/15) HIV: negative (03/29/15)  CBC    Component Value Date/Time   WBC 5.9 06/24/2016 1104   WBC 10.9 (H) 04/24/2016 1407   RBC 4.00 06/24/2016 1104   RBC 3.91 04/24/2016 1407   HGB 11.8 06/24/2016 1104   HCT 34.1 06/24/2016 1104   PLT 342 06/24/2016 1104   MCV 85 06/24/2016 1104   MCH 29.5 06/24/2016 1104   MCH 30.2 04/24/2016 1407   MCHC 34.6 06/24/2016 1104   MCHC 33.8 04/24/2016 1407   RDW 15.2 06/24/2016 1104   LYMPHSABS 2.0 06/24/2016 1104   MONOABS 654 04/24/2016 1407   EOSABS 0.4 06/24/2016 1104   BASOSABS 0.0 06/24/2016 1104   CMP     Component Value Date/Time   NA 139 06/24/2016 1104   K 4.1 06/24/2016 1104   CL 100 06/24/2016 1104   CO2 24 06/24/2016 1104   GLUCOSE 90 06/24/2016 1104   GLUCOSE 84 04/24/2016 1418   BUN 14 06/24/2016 1104   CREATININE 0.98 06/24/2016 1104   CREATININE 1.03 04/24/2016 1418   CALCIUM 9.4 06/24/2016 1104   PROT 7.3 06/24/2016 1104   ALBUMIN 3.9 06/24/2016 1104   AST 16 06/24/2016 1104   ALT 17 06/24/2016 1104   ALKPHOS 41 06/24/2016 1104   BILITOT 0.2 06/24/2016 1104   GFRNONAA 74 06/24/2016 1104   GFRNONAA 70 04/24/2016 1418   GFRAA 86 06/24/2016 1104   GFRAA 81 04/24/2016 1418   Assessment/Plan:  Counseled patient that Humira is a TNF blocking agent.  Reviewed Humira dose of 40 mg every other week.   Counseled patient on purpose, proper use, and adverse effects of Humira.  Reviewed the most common adverse effects including infections, headache, and injection site reactions. Discussed that there is the possibility of an increased risk of malignancy but it is not well understood if this increased risk is due to the medication or the disease state.  Advised patient to get yearly dermatology exams due to risk of skin cancer.  Reviewed the importance of regular labs while on Humira therapy.  Advised patient to get standing labs one month after starting Humira then every 3 months.  Provided patient with standing lab orders.  Counseled patient that Humira should be held prior to scheduled surgery.  Counseled patient to avoid live vaccines while on Humira.  Advised patient to get annual influenza vaccine and the pneumococcal vaccine as needed.  Provided patient with medication education material and answered all questions.  Patient voiced understanding.  Patient consented to Humira.  Will upload consent into the media tab.  Reviewed storage instructions of Humira.  Advised patient to contact the office to schedule the first Humira shot once Humira is approved by insurance.  Patient voiced understanding.    Elisabeth Most, Pharm.D., BCPS Clinical Pharmacist Pager: [phone] Phone: [phone] 09/09/2016 4:19 PM

## 2016-09-09 NOTE — Telephone Encounter (Signed)
Contacted patient and scheduled an appointment for 09/09/16 at 3:00 pm

## 2016-09-09 NOTE — Progress Notes (Signed)
Office Visit Note  Patient: Elizabeth Pope             Date of Birth: 31-Oct-1980           MRN: [id number]             PCP: Kristie Cowman, MD Referring: Kristie Cowman, MD Visit Date: 09/09/2016 Occupation: '@GUAROCC'$ @    Subjective:  Pain (constant pain 1 month and blood work)   History of Present Illness: Elizabeth Pope is a 36 y.o. female with history of sero positive rheumatoid arthritis. She states she's been having increased pain for the last month. She's been taking methotrexate 0.8 ML subcutaneously per week. She reports pain and discomfort in her bilateral shoulders bilateral elbows bilateral wrist bilateral hands and bilateral knee joints. She notices swelling in her bilateral hands and right foot.  Activities of Daily Living:  Patient reports morning stiffness for 20 minutes.   Patient Reports nocturnal pain.  Difficulty dressing/grooming: Reports Difficulty climbing stairs: Reports Difficulty getting out of chair: Reports Difficulty using hands for taps, buttons, cutlery, and/or writing: Reports   Review of Systems  Constitutional: Positive for fatigue. Negative for night sweats, weight gain, weight loss and weakness.  HENT: Positive for mouth dryness. Negative for mouth sores, trouble swallowing, trouble swallowing and nose dryness.   Eyes: Negative for pain, redness, visual disturbance and dryness.  Respiratory: Negative for cough, shortness of breath and difficulty breathing.   Cardiovascular: Negative for chest pain, palpitations, hypertension, irregular heartbeat and swelling in legs/feet.  Gastrointestinal: Negative for blood in stool, constipation and diarrhea.  Endocrine: Negative for increased urination.  Genitourinary: Negative for vaginal dryness.  Musculoskeletal: Positive for arthralgias, joint pain, joint swelling and morning stiffness. Negative for myalgias, muscle weakness, muscle tenderness and myalgias.  Skin: Negative for color change,  rash, hair loss, skin tightness, ulcers and sensitivity to sunlight.  Allergic/Immunologic: Negative for susceptible to infections.  Neurological: Negative for dizziness, memory loss and night sweats.  Hematological: Negative for swollen glands.  Psychiatric/Behavioral: Positive for depressed mood and sleep disturbance. The patient is nervous/anxious.     PMFS History:  Patient Active Problem List   Diagnosis Date Noted  . H/O tubal ligation 06/24/2016  . High risk medication use 01/07/2016  . GERD (gastroesophageal reflux disease) 12/25/2015  . Migraines 12/25/2015  . Hypertension 12/25/2015  . RA (rheumatoid arthritis) ([place]) 12/25/2015  . DDD (degenerative disc disease), cervical 12/25/2015  . Osteoarthritis of both hands 12/25/2015  . 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/2015  . 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 Bipolar 2 disorder ([place]) 05/14/2015  . Cigarette smoker 05/05/2015  . Chest pain 05/05/2015  . Solitary pulmonary nodule 05/04/2015    Past Medical History:  Diagnosis Date  . Anxiety   . Bipolar 1 disorder ([place])   . DDD (degenerative disc disease), cervical 12/25/2015  . Depression   . GERD (gastroesophageal reflux disease) 12/25/2015  . Hypertension 12/25/2015  . Migraine   . Migraines 12/25/2015  . 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/2015   mild  . Osteoarthritis of both hands 12/25/2015  . RA (rheumatoid arthritis) ([facility]) 12/25/2015   Positive RF, Elevated ESR, Positive CCP > 250     Family History  Problem Relation Age of Onset  . Bronchitis Daughter   . Allergies Unknown        "everybody"  . Rheum arthritis Mother   . Migraines Mother   . Hypertension Mother   . Colitis  Mother   . Rheum arthritis Maternal Grandmother   . Rheum arthritis Maternal Aunt   . Migraines Sister   . Migraines Brother    Past Surgical History:  Procedure Laterality Date  . CESAREAN SECTION    . TUBAL LIGATION     Social History     Social History Narrative  . No narrative on file     Objective: Vital Signs: BP 127/88 (BP Location: Right Arm, Patient Position: Sitting, Cuff Size: Normal)   Pulse 81   Resp 16   Ht 5' 4.5" (1.638 m)   Wt 156 lb (70.8 kg)   BMI 26.36 kg/m    Physical Exam  Constitutional: She is oriented to person, place, and time. She appears well-developed and well-nourished.  HENT:  Head: Normocephalic and atraumatic.  Eyes: Conjunctivae and EOM are normal.  Neck: Normal range of motion.  Cardiovascular: Normal rate, regular rhythm, normal heart sounds and intact distal pulses.   Pulmonary/Chest: Effort normal and breath sounds normal.  Abdominal: Soft. Bowel sounds are normal.  Lymphadenopathy:    She has no cervical adenopathy.  Neurological: She is alert and oriented to person, place, and time.  Skin: Skin is warm and dry. Capillary refill takes less than 2 seconds.  Psychiatric: She has a normal mood and affect. Her behavior is normal.  Nursing note and vitals reviewed.    Musculoskeletal Exam: C-spine and thoracic lumbar spine good range of motion. She had limited range of motion of her bilateral shoulder joints. She has contracture in her bilateral elbow joints. She had a palpable nodule on her right elbow. She has decreased range of motion in her right wrist joint. Synovitis as described below.  CDAI Exam: CDAI Homunculus Exam:   Tenderness:  RUE: glenohumeral, ulnohumeral and radiohumeral and wrist LUE: glenohumeral, ulnohumeral and radiohumeral and wrist RLE: tibiofemoral and tibiotalar LLE: tibiofemoral and tibiotalar Right foot: 2nd MTP  Swelling:  RUE: ulnohumeral and radiohumeral and wrist  Joint Counts:  CDAI Tender Joint count: 8 CDAI Swollen Joint count: 2  Global Assessments:  Patient Global Assessment: 8 Provider Global Assessment: 6  CDAI Calculated Score: 24    Investigation: Findings:  February 2017:  CBC, comprehensive metabolic panel, CK, and  TSH were normal.  CCP was more than 250.  Hepatitis panel, HIV, immunoglobulins, SPEP, and TB Gold were all within normal limits.  06/24/2016 Hepatitis B surface antigen, Hepatitis C antibody, HIV antibody, and RPR negative    CBC Latest Ref Rng & Units 06/24/2016 04/24/2016 12/26/2015  WBC 3.4 - 10.8 x10E3/uL 5.9 10.9(H) 6.6  Hemoglobin 11.1 - 15.9 g/dL 11.8 11.8 12.4  Hematocrit 34.0 - 46.6 % 34.1 34.9(L) 35.8  Platelets 150 - 379 x10E3/uL 342 319 355    CMP Latest Ref Rng & Units 06/24/2016 04/24/2016 12/26/2015  Glucose 65 - 99 mg/dL 90 84 69  BUN 6 - 20 mg/dL '14 17 11  '$ Creatinine 0.57 - 1.00 mg/dL 0.98 1.03 1.00  Sodium 134 - 144 mmol/L 139 140 139  Potassium 3.5 - 5.2 mmol/L 4.1 3.9 4.2  Chloride 96 - 106 mmol/L 100 107 108  CO2 18 - 29 mmol/L '24 24 23  '$ Calcium 8.7 - 10.2 mg/dL 9.4 9.3 9.7  Total Protein 6.0 - 8.5 g/dL 7.3 6.8 7.2  Total Bilirubin 0.0 - 1.2 mg/dL 0.2 0.3 0.3  Alkaline Phos 39 - 117 IU/L 41 36 40  AST 0 - 40 IU/L '16 18 19  '$ ALT 0 - 32 IU/L 17 45(H) 23  Imaging: Xr Foot 2 Views Left  Result Date: 09/09/2016 No MTP PIP/DIP narrowing was noted. No erosive changes were noted. No intertarsal joint space narrowing was noted. No tibiotalar joint space narrowing was noted. Impression normal x-ray of the foot  Xr Foot 2 Views Right  Result Date: 09/09/2016 No MTP PIP/DIP narrowing was noted. No erosive changes were noted. No intertarsal joint space narrowing was noted. No tibiotalar joint space narrowing was noted. Impression normal x-ray of the foot  Xr Hand 2 View Left  Result Date: 09/09/2016 No MCP PIP/DIP narrowing or intercarpal joint space narrowing was noted. No erosive changes were noted. Mild juxta articular osteopenia was noted. Impression these findings are consistent with rheumatoid arthritis  Xr Hand 2 View Right  Result Date: 09/09/2016 No MCP PIP/DIP narrowing was noted. Mild intercarpal joint space narrowing was noted. Myalgic starting osteopenia was  noted. Impression: These signs are consistent with rheumatoid arthritis   Speciality Comments: No specialty comments available.    Procedures:  No procedures performed Allergies: Fluoxetine; Plaquenil [hydroxychloroquine]; and Vicodin [hydrocodone-acetaminophen]   Assessment / Plan:     Visit Diagnoses: Rheumatoid arthritis involving multiple sites with positive rheumatoid factor and +CCP ([place]): She has active disease with multiple arthralgias and synovitis as described above. Different treatment options and their side effects were discussed today. 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 We will obtain chest x-ray today. After reviewing indications side effects contraindications of different medications we decided to proceed with Humira. Handout was given and consent was obtained. We will apply for Humira. Once approved we will start the medication as soon as possible.  High risk medication use - Methotrexate 0.8 ML subcutaneous every week, folic acid 2 mg by mouth daily - Plan: CBC with Differential/Platelet, COMPLETE METABOLIC PANEL WITH GFR, Quantiferon tb gold assay (blood), DG Chest 2 View  Pain bilateral hands with synovitis in her wrists joint and some of the MCP joints as described above. X-ray bilateral hands 2 views today did not show any erosive changes. She has mild intercarpal joint space narrowing in the right wrist.  Pain bilateral feet: She has discomfort in her ankles and MTP joints. All the x-rays were unremarkable.  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 Muscle strengthening exercises were discussed.  DDD (degenerative disc disease), cervical: She is not having much discomfort currently.  TB lung, latent treated with INH 2017  - ok for biologics per office note from Dr [facility] on 08/27/2015   Solitary pulmonary nodule  Cigarette smoker  Pain in both hands - Plan: XR Hand 2 View Right,  XR Hand 2 View Left  Pain in both feet - Plan: XR Foot 2 Views Right, XR Foot 2 Views Left    Orders: Orders Placed This Encounter  Procedures  . XR Hand 2 View Right  . XR Hand 2 View Left  . XR Foot 2 Views Right  . XR Foot 2 Views Left  . DG Chest 2 View  . CBC with Differential/Platelet  . COMPLETE METABOLIC PANEL WITH GFR  . Quantiferon tb gold assay (blood)   No orders of the defined types were placed in this encounter.   Face-to-face time spent with patient was 30 minutes. 50% of time was spent in counseling and coordination of care.  Follow-Up Instructions: Return for Rheumatoid arthritis, Osteoarthritis,DDD.   Bo Merino, MD  Note - This record has been created using Editor, commissioning.  Chart  creation errors have been sought, but may not always  have been located. Such creation errors do not reflect on  the standard of medical care.

## 2016-09-09 NOTE — Telephone Encounter (Signed)
Patient called again wanting to  Know what can be done about her pain. She is hurting continually. Please call to advise.

## 2016-09-09 NOTE — Patient Instructions (Addendum)
Please go to [facility] Radiology Department to get chest x-ray  Adalimumab Injection What is this medicine? ADALIMUMAB (a dal AYE mu mab) is used to treat rheumatoid and psoriatic arthritis. It is also used to treat ankylosing spondylitis, Crohn's disease, ulcerative colitis, plaque psoriasis, hidradenitis suppurativa, and uveitis. This medicine may be used for other purposes; ask your health care provider or pharmacist if you have questions. COMMON BRAND NAME(S): CYLTEZO, Humira What should I tell my health care provider before I take this medicine? They need to know if you have any of these conditions: -diabetes -heart disease -hepatitis B or history of hepatitis B infection -immune system problems -infection or history of infections -multiple sclerosis -recently received or scheduled to receive a vaccine -scheduled to have surgery -tuberculosis, a positive skin test for tuberculosis or have recently been in close contact with someone who has tuberculosis -an unusual reaction to adalimumab, other medicines, mannitol, latex, rubber, foods, dyes, or preservatives -pregnant or trying to get pregnant -breast-feeding How should I use this medicine? This medicine is for injection under the skin. You will be taught how to prepare and give this medicine. Use exactly as directed. Take your medicine at regular intervals. Do not take your medicine more often than directed. A special MedGuide will be given to you by the pharmacist with each prescription and refill. Be sure to read this information carefully each time. It is important that you put your used needles and syringes in a special sharps container. Do not put them in a trash can. If you do not have a sharps container, call your pharmacist or healthcare provider to get one. Talk to your pediatrician regarding the use of this medicine in children. While this drug may be prescribed for children as young as 2 years for selected conditions,  precautions do apply. The manufacturer of the medicine offers free information to patients and their health care partners. Call [phone] for more information. Overdosage: If you think you have taken too much of this medicine contact a poison control center or emergency room at once. NOTE: This medicine is only for you. Do not share this medicine with others. What if I miss a dose? If you miss a dose, take it as soon as you can. If it is almost time for your next dose, take only that dose. Do not take double or extra doses. Give the next dose when your next scheduled dose is due. Call your doctor or health care professional if you are not sure how to handle a missed dose. What may interact with this medicine? Do not take this medicine with any of the following medications: -abatacept -anakinra -etanercept -infliximab -live virus vaccines -rilonacept This medicine may also interact with the following medications: -vaccines This list may not describe all possible interactions. Give your health care provider a list of all the medicines, herbs, non-prescription drugs, or dietary supplements you use. Also tell them if you smoke, drink alcohol, or use illegal drugs. Some items may interact with your medicine. What should I watch for while using this medicine? Visit your doctor or health care professional for regular checks on your progress. Tell your doctor or healthcare professional if your symptoms do not start to get better or if they get worse. You will be tested for tuberculosis (TB) before you start this medicine. If your doctor prescribes any medicine for TB, you should start taking the TB medicine before starting this medicine. Make sure to finish the full course of TB medicine.  Call your doctor or health care professional if you get a cold or other infection while receiving this medicine. Do not treat yourself. This medicine may decrease your body's ability to fight infection. Talk to  your doctor about your risk of cancer. You may be more at risk for certain types of cancers if you take this medicine. What side effects may I notice from receiving this medicine? Side effects that you should report to your doctor or health care professional as soon as possible: -allergic reactions like skin rash, itching or hives, swelling of the face, lips, or tongue -breathing problems -changes in vision -chest pain -fever, chills, or any other sign of infection -numbness or tingling -red, scaly patches or raised bumps on the skin -swelling of the ankles -swollen lymph nodes in the neck, underarm, or groin areas -unexplained weight loss -unusual bleeding or bruising -unusually weak or tired Side effects that usually do not require medical attention (report to your doctor or health care professional if they continue or are bothersome): -headache -nausea -redness, itching, swelling, or bruising at site where injected This list may not describe all possible side effects. Call your doctor for medical advice about side effects. You may report side effects to FDA at 1-800-FDA-1088. Where should I keep my medicine? Keep out of the reach of children. Store in the original container and in the refrigerator between 2 and 8 degrees C (36 and 46 degrees F). Do not freeze. The product may be stored in a cool carrier with an ice pack, if needed. Protect from light. Throw away any unused medicine after the expiration date. NOTE: This sheet is a summary. It may not cover all possible information. If you have questions about this medicine, talk to your doctor, pharmacist, or health care provider.  2018 Elsevier/Gold Standard (2014-08-30 11:11:43)

## 2016-09-10 ENCOUNTER — Telehealth: Payer: Self-pay

## 2016-09-10 NOTE — Telephone Encounter (Signed)
A prior authorization for Humira was submitted to [facility] via Phone [phone]). Spoke with [place] who processed the information and submitted it for review. We should have an answer within 24 hours. Will update once we receive a response.   Reference number: [id number] Interaction ID: [id number]  Demetrios Loll, CPhT 2:21 PM

## 2016-09-11 LAB — QUANTIFERON TB GOLD ASSAY (BLOOD)
INTERFERON GAMMA RELEASE ASSAY: NEGATIVE
Mitogen-Nil: 8.49 IU/mL
Quantiferon Nil Value: 0.12 IU/mL
Quantiferon Tb Ag Minus Nil Value: 0.17 IU/mL

## 2016-09-11 NOTE — Telephone Encounter (Signed)
Called [employer] to check the status of patient's authorization. Spoke with Patty who states that the patient has been approved from 09/10/17 through 09/11/2016.   Reference number: [id number] Interaction ID: [id number]  Demetrios Loll, CPhT 4:32 PM

## 2016-09-11 NOTE — Telephone Encounter (Signed)
Patient states when she was in the office the prednisone taper was discussed but not sent to the pharmacy.    Last Visit: 09/09/16 Next Visit: 09/24/16 Labs: 04/24/16 ALT elevated at 45 CBC normal. Labs updated 09/09/16 but have not resulted yet.   Okay to refill prednisone and MTX?

## 2016-09-11 NOTE — Progress Notes (Signed)
WNL

## 2016-09-11 NOTE — Telephone Encounter (Signed)
ok 

## 2016-09-12 NOTE — Telephone Encounter (Signed)
I spoke to patient to inform her that Humira was approved.  Upon reviewing the plan, noted patient was supposed to have a chest x-ray before starting Humira.  I called patient who reports she forgot she needed to get chest x-ray.  Patient will go get x-ray and will proceed with Humira after that.    Elisabeth Most, Pharm.D., BCPS, CPP Clinical Pharmacist Pager: [phone] Phone: [phone] 09/12/2016 3:52 PM

## 2016-09-17 ENCOUNTER — Ambulatory Visit (HOSPITAL_COMMUNITY)
Admission: RE | Admit: 2016-09-17 | Discharge: 2016-09-17 | Disposition: A | Discharge status: Home / Self Care | Payer: Medicaid Other | Source: Ambulatory Visit | Attending: Rheumatology | Admitting: Rheumatology

## 2016-09-17 DIAGNOSIS — Z79899 Other long term (current) drug therapy: Secondary | ICD-10-CM | POA: Insufficient documentation

## 2016-09-17 IMAGING — CR DG CHEST 2V
2 series · 2 of 2 positions shown · non-contrast
Comparison: [DATE]

CLINICAL DATA: Screening for immunosuppressive therapy

EXAM:
CHEST  2 VIEW

[chest pa]
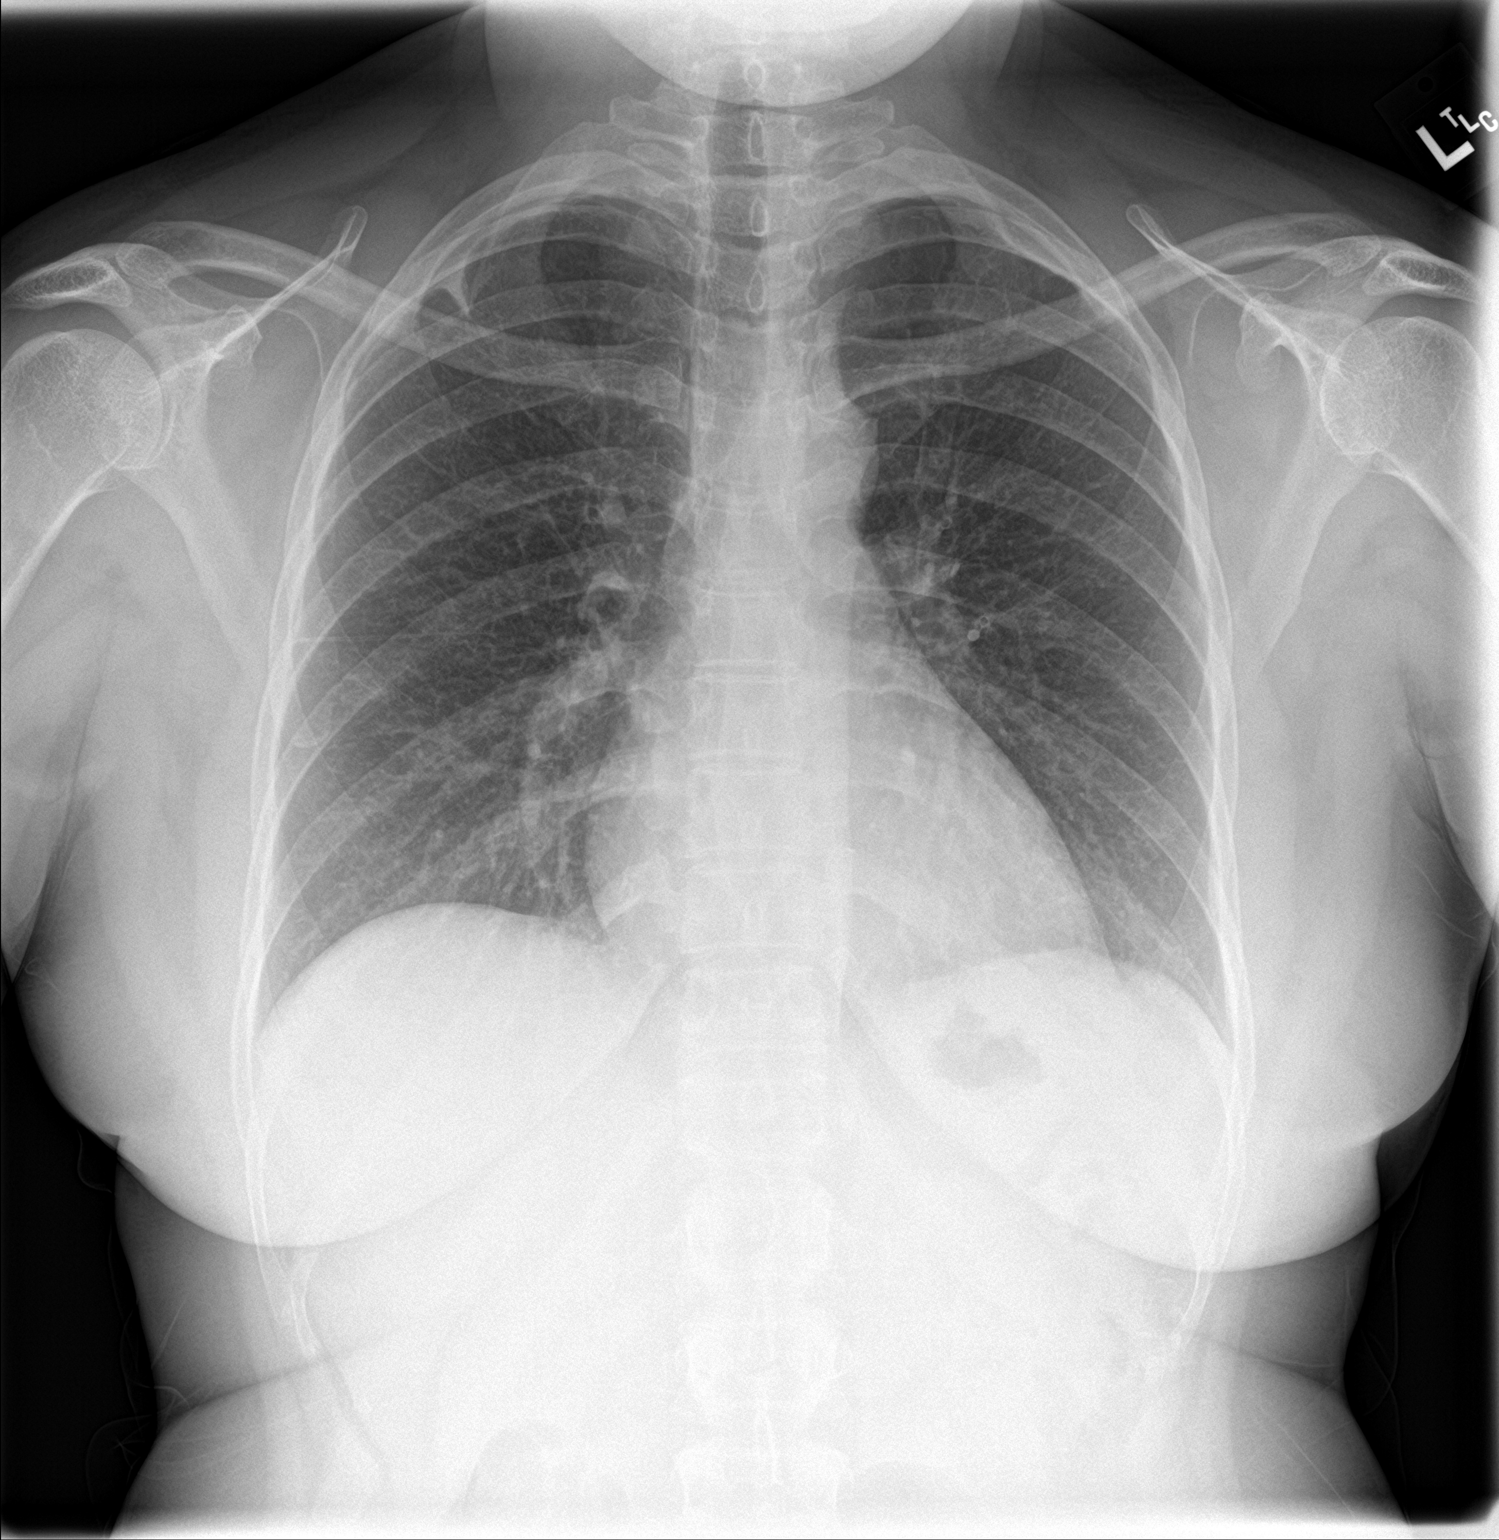

[chest lat]
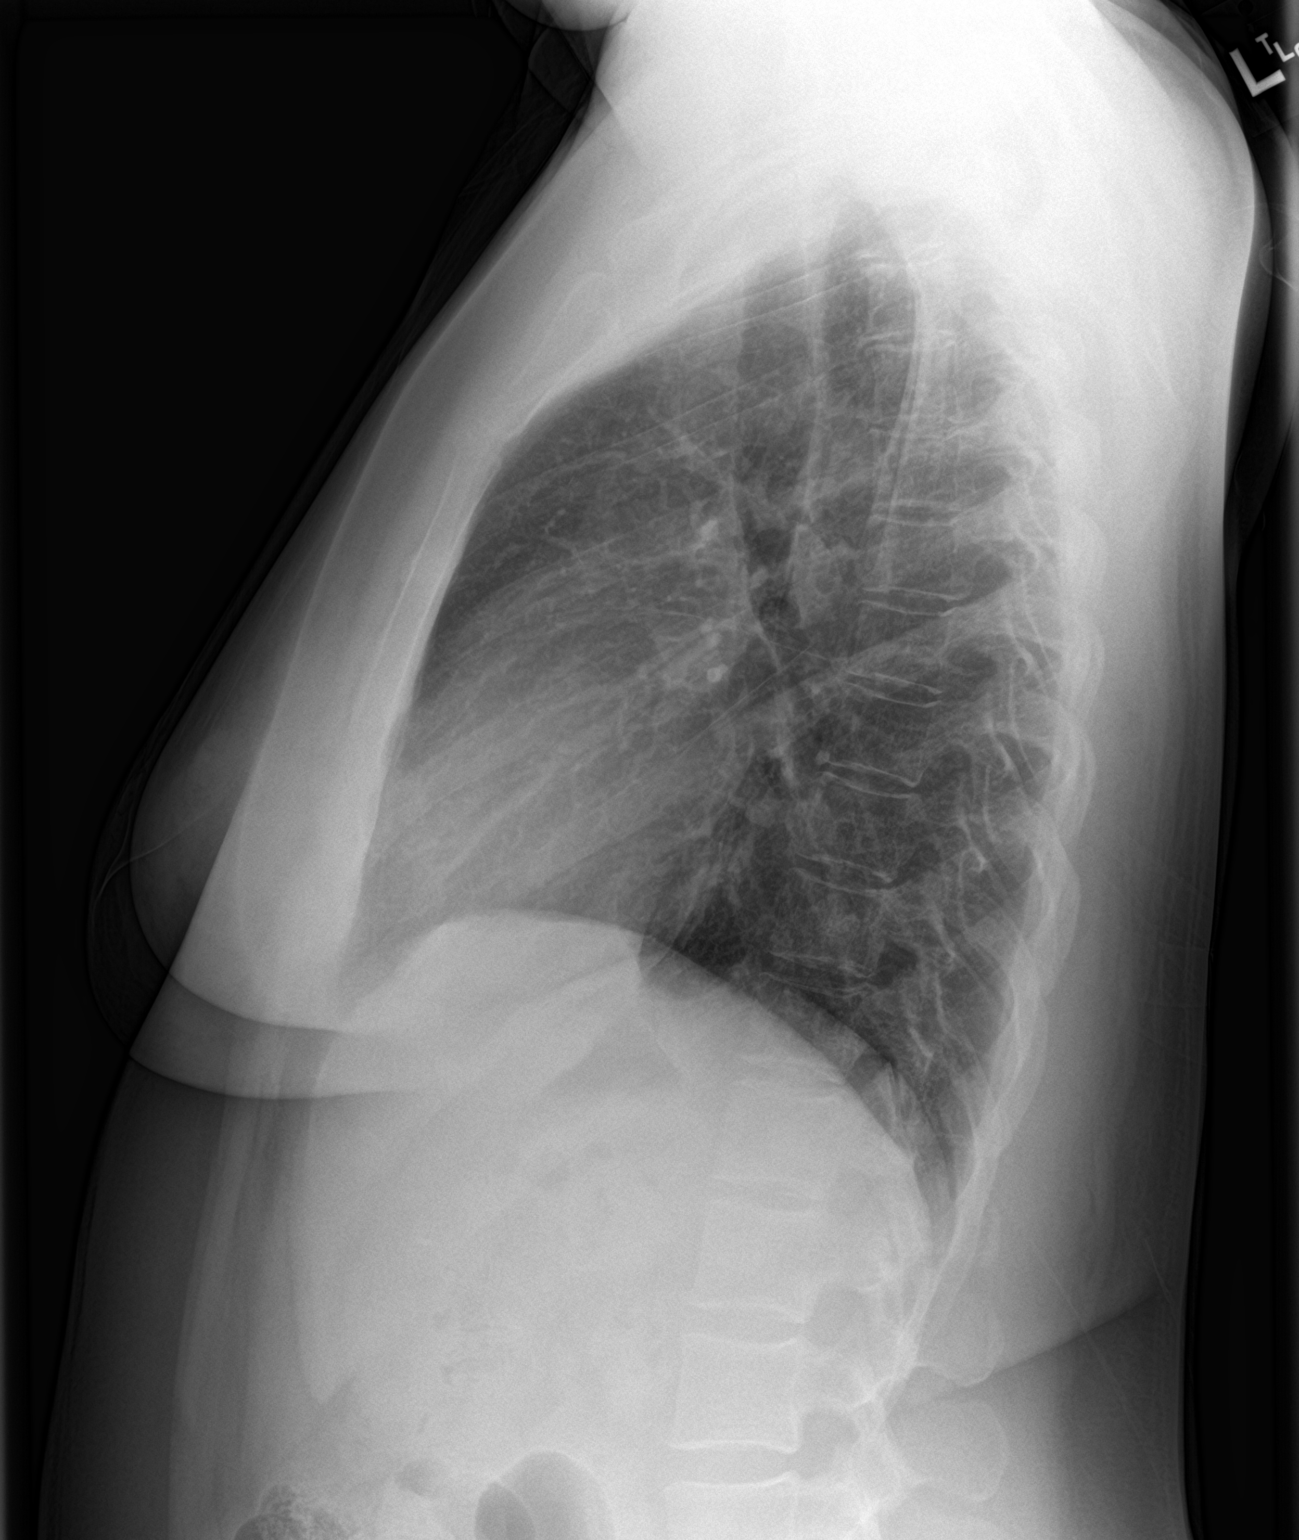

[2 of 2 positions shown; findings below may reference images not displayed]

FINDINGS: The heart size and mediastinal contours are within normal limits.
Both lungs are clear. The visualized skeletal structures are
unremarkable.
IMPRESSION: No active cardiopulmonary disease.

## 2016-09-18 ENCOUNTER — Telehealth: Payer: Self-pay

## 2016-09-18 MED ORDER — ADALIMUMAB 40 MG/0.8ML ~~LOC~~ AJKT: 40.0000 mg | AUTO-INJECTOR | SUBCUTANEOUS | 2 refills | Status: DC

## 2016-09-18 NOTE — Progress Notes (Signed)
CXR normal.

## 2016-09-18 NOTE — Addendum Note (Signed)
Addended by: Cyndia Skeeters on: 09/18/2016 03:18 PM   Modules accepted: Orders

## 2016-09-18 NOTE — Telephone Encounter (Signed)
Advised patient we received results of chest x-ray which were normal.  Will proceed with Humira.  Prescription was sent to the pharmacy for delivery to clinic.  Patient has visit scheduled on 09/24/16 and would like to receive first injection at that time.   Elisabeth Most, Pharm.D., BCPS, CPP Clinical Pharmacist Pager: [phone] Phone: [phone] 09/18/2016 3:13 PM

## 2016-09-18 NOTE — Telephone Encounter (Signed)
Called patient to set up RX fill for [place] at [facility]. Left message for patient to call back.  Aribella Vavra, [place], CPhT 4:36 PM

## 2016-09-24 ENCOUNTER — Telehealth: Payer: Self-pay

## 2016-09-24 ENCOUNTER — Ambulatory Visit (INDEPENDENT_AMBULATORY_CARE_PROVIDER_SITE_OTHER): Payer: Medicaid Other | Admitting: *Deleted

## 2016-09-24 ENCOUNTER — Ambulatory Visit: Payer: Medicaid Other | Admitting: Rheumatology

## 2016-09-24 VITALS — BP 113/78 | HR 91

## 2016-09-24 DIAGNOSIS — Z79899 Other long term (current) drug therapy: Secondary | ICD-10-CM

## 2016-09-24 DIAGNOSIS — M0579 Rheumatoid arthritis with rheumatoid factor of multiple sites without organ or systems involvement: Secondary | ICD-10-CM

## 2016-09-24 MED ORDER — ADALIMUMAB 40 MG/0.8ML ~~LOC~~ PSKT
40.0000 mg | PREFILLED_SYRINGE | Freq: Once | SUBCUTANEOUS | Status: AC
  Administered 2016-09-24: 40 mg via SUBCUTANEOUS

## 2016-09-24 MED FILL — HUMIRA PEN 40 MG/0.8ML PNKT: 40 | 30 days supply | Qty: 2 | Fill #0

## 2016-09-24 NOTE — Progress Notes (Signed)
Patient in the office for a new start to Humira. Patient provided medication. Patient was given injection in her right thigh. Patient tolerated injection well. Patient was monitored in office for 30 minutes after administration for adverse reactions. No adverse reactions noted.   Administrations This Visit    Adalimumab PSKT 40 mg    Admin Date 09/24/2016 Action Given Dose 40 mg Route Subcutaneous Administered By Carole Binning, LPN

## 2016-09-24 NOTE — Progress Notes (Signed)
Pharmacy Note  Subjective:   Patient is being initiated on Humira.  Patient was previously counseled extensively on Humira on 09/09/16 and consented to initiation of Humira at that time.  Patient presents to clinic today to receive the first dose of Humira.  Patient denies any active infection.     Objective: CMP     Component Value Date/Time   NA 136 09/09/2016 1646   NA 139 06/24/2016 1104   K 4.8 09/09/2016 1646   CL 104 09/09/2016 1646   CO2 23 09/09/2016 1646   GLUCOSE 65 09/09/2016 1646   BUN 10 09/09/2016 1646   BUN 14 06/24/2016 1104   CREATININE 0.95 09/09/2016 1646   CALCIUM 9.5 09/09/2016 1646   PROT 7.5 09/09/2016 1646   PROT 7.3 06/24/2016 1104   ALBUMIN 4.1 09/09/2016 1646   ALBUMIN 3.9 06/24/2016 1104   AST 22 09/09/2016 1646   ALT 20 09/09/2016 1646   ALKPHOS 49 09/09/2016 1646   BILITOT 0.3 09/09/2016 1646   BILITOT 0.2 06/24/2016 1104   GFRNONAA 77 09/09/2016 1646   GFRAA 89 09/09/2016 1646   CBC    Component Value Date/Time   WBC 7.3 09/09/2016 1646   RBC 4.47 09/09/2016 1646   HGB 12.6 09/09/2016 1646   HGB 11.8 06/24/2016 1104   HCT 37.5 09/09/2016 1646   HCT 34.1 06/24/2016 1104   PLT 373 09/09/2016 1646   PLT 342 06/24/2016 1104   MCV 83.9 09/09/2016 1646   MCV 85 06/24/2016 1104   MCH 28.2 09/09/2016 1646   MCHC 33.6 09/09/2016 1646   RDW 16.4 (H) 09/09/2016 1646   RDW 15.2 06/24/2016 1104   LYMPHSABS 2,555 09/09/2016 1646   LYMPHSABS 2.0 06/24/2016 1104   MONOABS 438 09/09/2016 1646   EOSABS 365 09/09/2016 1646   EOSABS 0.4 06/24/2016 1104   BASOSABS 0 09/09/2016 1646   BASOSABS 0.0 06/24/2016 1104   TB Gold: negative (09/09/16)  Assessment/Plan:  Patient was counseled on how to administer subcutaneous Humira injection using a demonstration pen.  Patient received her first dose of Humira.  Lot: [id number], Exp. 01/2018.  Patient was monitored for 30 minutes post injection.  No injection site reaction noted.  Patient will need standing lab  orders in one month.  Provided patient with standing lab instructions and placed standing lab order.  Patient has follow up visit scheduled for 11/13/16.    Elisabeth Most, Pharm.D., BCPS Clinical Pharmacist Pager: [phone] Phone: [phone] 09/24/2016 2:13 PM

## 2016-09-24 NOTE — Telephone Encounter (Signed)
Coordinated mediation delivery from pharmacy. Humira has been received and placed in refrigerator   Jaxden Blyden, CPhT 1:20 PM

## 2016-09-24 NOTE — Patient Instructions (Signed)
Standing Labs We placed an order today for your standing lab work.    Please come back and get your standing labs in September 2018 and every 3 months.  We have open lab Monday through Friday from 8:30-11:30 AM and 1:30-4 PM at the office of Dr. Bo Merino.   The office is located at [address] [place], [place], [place] [address] No appointment is necessary.   Labs are drawn by [facility].  You may receive a bill from [facility] for your lab work. If you have any questions regarding directions or hours of operation,  please call [phone].    Adalimumab Injection What is this medicine? ADALIMUMAB (a dal AYE mu mab) is used to treat rheumatoid and psoriatic arthritis. It is also used to treat ankylosing spondylitis, Crohn's disease, ulcerative colitis, plaque psoriasis, hidradenitis suppurativa, and uveitis. This medicine may be used for other purposes; ask your health care provider or pharmacist if you have questions. COMMON BRAND NAME(S): CYLTEZO, Humira What should I tell my health care provider before I take this medicine? They need to know if you have any of these conditions: -diabetes -heart disease -hepatitis B or history of hepatitis B infection -immune system problems -infection or history of infections -multiple sclerosis -recently received or scheduled to receive a vaccine -scheduled to have surgery -tuberculosis, a positive skin test for tuberculosis or have recently been in close contact with someone who has tuberculosis -an unusual reaction to adalimumab, other medicines, mannitol, latex, rubber, foods, dyes, or preservatives -pregnant or trying to get pregnant -breast-feeding How should I use this medicine? This medicine is for injection under the skin. You will be taught how to prepare and give this medicine. Use exactly as directed. Take your medicine at regular intervals. Do not take your medicine more often than directed. A special MedGuide will be given to  you by the pharmacist with each prescription and refill. Be sure to read this information carefully each time. It is important that you put your used needles and syringes in a special sharps container. Do not put them in a trash can. If you do not have a sharps container, call your pharmacist or healthcare provider to get one. Talk to your pediatrician regarding the use of this medicine in children. While this drug may be prescribed for children as young as 2 years for selected conditions, precautions do apply. The manufacturer of the medicine offers free information to patients and their health care partners. Call [phone] for more information. Overdosage: If you think you have taken too much of this medicine contact a poison control center or emergency room at once. NOTE: This medicine is only for you. Do not share this medicine with others. What if I miss a dose? If you miss a dose, take it as soon as you can. If it is almost time for your next dose, take only that dose. Do not take double or extra doses. Give the next dose when your next scheduled dose is due. Call your doctor or health care professional if you are not sure how to handle a missed dose. What may interact with this medicine? Do not take this medicine with any of the following medications: -abatacept -anakinra -etanercept -infliximab -live virus vaccines -rilonacept This medicine may also interact with the following medications: -vaccines This list may not describe all possible interactions. Give your health care provider a list of all the medicines, herbs, non-prescription drugs, or dietary supplements you use. Also tell them if you smoke, drink  alcohol, or use illegal drugs. Some items may interact with your medicine. What should I watch for while using this medicine? Visit your doctor or health care professional for regular checks on your progress. Tell your doctor or healthcare professional if your symptoms do not  start to get better or if they get worse. You will be tested for tuberculosis (TB) before you start this medicine. If your doctor prescribes any medicine for TB, you should start taking the TB medicine before starting this medicine. Make sure to finish the full course of TB medicine. Call your doctor or health care professional if you get a cold or other infection while receiving this medicine. Do not treat yourself. This medicine may decrease your body's ability to fight infection. Talk to your doctor about your risk of cancer. You may be more at risk for certain types of cancers if you take this medicine. What side effects may I notice from receiving this medicine? Side effects that you should report to your doctor or health care professional as soon as possible: -allergic reactions like skin rash, itching or hives, swelling of the face, lips, or tongue -breathing problems -changes in vision -chest pain -fever, chills, or any other sign of infection -numbness or tingling -red, scaly patches or raised bumps on the skin -swelling of the ankles -swollen lymph nodes in the neck, underarm, or groin areas -unexplained weight loss -unusual bleeding or bruising -unusually weak or tired Side effects that usually do not require medical attention (report to your doctor or health care professional if they continue or are bothersome): -headache -nausea -redness, itching, swelling, or bruising at site where injected This list may not describe all possible side effects. Call your doctor for medical advice about side effects. You may report side effects to FDA at 1-800-FDA-1088. Where should I keep my medicine? Keep out of the reach of children. Store in the original container and in the refrigerator between 2 and 8 degrees C (36 and 46 degrees F). Do not freeze. The product may be stored in a cool carrier with an ice pack, if needed. Protect from light. Throw away any unused medicine after the expiration  date. NOTE: This sheet is a summary. It may not cover all possible information. If you have questions about this medicine, talk to your doctor, pharmacist, or health care provider.  2018 Elsevier/Gold Standard (2014-08-30 11:11:43)

## 2016-10-07 ENCOUNTER — Telehealth: Payer: Self-pay | Admitting: Rheumatology

## 2016-10-07 ENCOUNTER — Other Ambulatory Visit: Payer: Self-pay | Admitting: Radiology

## 2016-10-07 MED ORDER — PREDNISONE 5 MG PO TABS: ORAL_TABLET | ORAL | 0 refills | Status: DC

## 2016-10-07 NOTE — Telephone Encounter (Signed)
Pt was given prednisone last month #54,TAKE 4 TABLETS BY MOUTH EVERY MORNING FOR 5 DAYS, 3 FOR 5 DAYS, 2 TABLETS FOR 5 DAYS, 1 TABLET FOR 5 DAYS, 1/2 TABLET FOR 5 DAYS, THEN STOP  She is on Methotrexate, has just started Humira 2 weeks ago   C/o flare since d/c of prednisone taper, wants additional prednisone, ok to repeat taper?

## 2016-10-07 NOTE — Telephone Encounter (Signed)
Patient states she started on Humira and was told to call in if she needed a prescription for prednisone due to the pain she is experiencing. Patient is requesting the prednisone rx be sent to [facility] on [address].

## 2016-10-07 NOTE — Telephone Encounter (Signed)
ok 

## 2016-10-17 MED FILL — HUMIRA PEN 40 MG/0.8ML PNKT: 40 | 30 days supply | Qty: 2 | Fill #1

## 2016-11-19 ENCOUNTER — Telehealth: Payer: Self-pay

## 2016-11-19 NOTE — Telephone Encounter (Signed)
Received a message from [facility] stating that they have not been successful at reaching the patient to schedule her refill on HUMIRA. Called patient to update. Left message for patient to call back.  Merranda Bolls, [place], CPhT 8:56 AM

## 2016-11-20 ENCOUNTER — Telehealth: Payer: Self-pay | Admitting: Rheumatology

## 2016-11-20 MED FILL — HUMIRA PEN 40 MG/0.8ML PNKT: 40 | 30 days supply | Qty: 2 | Fill #2

## 2016-11-20 NOTE — Telephone Encounter (Signed)
Patient returned Elizabeth Pope's phone call. Please call patient back.

## 2016-11-21 NOTE — Telephone Encounter (Signed)
Patient returned call. Her Rx was filled at [facility] on 9/27.

## 2016-12-15 ENCOUNTER — Other Ambulatory Visit: Payer: Self-pay | Admitting: Rheumatology

## 2016-12-16 ENCOUNTER — Other Ambulatory Visit: Payer: Self-pay | Admitting: Rheumatology

## 2016-12-16 NOTE — Progress Notes (Signed)
Office Visit Note  Patient: Elizabeth Pope             Date of Birth: 03/10/1980           MRN: [id number]             PCP: Kristie Cowman, MD Referring: Kristie Cowman, MD Visit Date: 12/30/2016 Occupation: _0 @    Subjective:  Follow-up (WHOLE BODY IN PAIN HANDS FEEL LIKE THEY ARE GOING TO EXPLODE.)   History of Present Illness: Elizabeth Pope is a 36 y.o. female with history of sero positive rheumatoid arthritis. She's been on Humira for a month now. She states she continues to have pain and discomfort in her joints. She states the most the pain is in her hands and her knees. Her hands continue to swell. She states she has not noticed any improvement on Humira so far.   Activities of Daily Living:  Patient reports morning stiffness for 2 hours.   Patient Reports nocturnal pain.  Difficulty dressing/grooming: Reports Difficulty climbing stairs: Reports Difficulty getting out of chair: Reports Difficulty using hands for taps, buttons, cutlery, and/or writing: Reports   Review of Systems  Constitutional: Positive for fatigue. Negative for night sweats, weight gain, weight loss and weakness.  HENT: Positive for mouth dryness. Negative for mouth sores, trouble swallowing, trouble swallowing and nose dryness.   Eyes: Negative.  Negative for pain, redness, visual disturbance and dryness.  Respiratory: Negative.  Negative for cough, shortness of breath and difficulty breathing.   Cardiovascular: Positive for hypertension. Negative for chest pain, palpitations, irregular heartbeat and swelling in legs/feet.  Gastrointestinal: Negative.  Negative for blood in stool, constipation and diarrhea.  Endocrine: Negative for increased urination.  Genitourinary: Negative for vaginal dryness.  Musculoskeletal: Positive for arthralgias, joint pain, joint swelling, myalgias, morning stiffness and myalgias. Negative for muscle weakness and muscle tenderness.  Skin: Negative.   Negative for color change, rash, hair loss, skin tightness, ulcers and sensitivity to sunlight.  Allergic/Immunologic: Negative for susceptible to infections.  Neurological: Positive for dizziness and headaches. Negative for numbness, memory loss and night sweats.  Hematological: Negative for swollen glands.  Psychiatric/Behavioral: Positive for depressed mood and sleep disturbance. The patient is not nervous/anxious.     PMFS History:  Patient Active Problem List   Diagnosis Date Noted  . H/O tubal ligation 06/24/2016  . High risk medication use 01/07/2016  . GERD (gastroesophageal reflux disease) 12/25/2015  . Migraines 12/25/2015  . Hypertension 12/25/2015  . RA (rheumatoid arthritis) ([place]) 12/25/2015  . DDD (degenerative disc disease), cervical 12/25/2015  . Osteoarthritis of both hands 12/25/2015  . 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/2015  . 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 Bipolar 2 disorder ([place]) 05/14/2015  . Cigarette smoker 05/05/2015  . Chest pain 05/05/2015  . Solitary pulmonary nodule 05/04/2015    Past Medical History:  Diagnosis Date  . Anxiety   . Bipolar 1 disorder ([place])   . DDD (degenerative disc disease), cervical 12/25/2015  . Depression   . GERD (gastroesophageal reflux disease) 12/25/2015  . Hypertension 12/25/2015  . Migraine   . Migraines 12/25/2015  . 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/2015   mild  . Osteoarthritis of both hands 12/25/2015  . RA (rheumatoid arthritis) (HCC) 12/25/2015   Positive RF, Elevated ESR, Positive CCP > 250     Family History  Problem Relation Age of Onset  . Bronchitis Daughter   . Allergies Unknown        "  everybody"  . Rheum arthritis Mother   . Migraines Mother   . Hypertension Mother   . Colitis Mother   . Rheum arthritis Maternal Grandmother   . Rheum arthritis Maternal Aunt   . Migraines Sister   . Migraines Brother    Past Surgical History:  Procedure Laterality  Date  . CESAREAN SECTION    . TUBAL LIGATION     Social History   Social History Narrative  . Not on file     Objective: Vital Signs: BP 117/82 (BP Location: Left Arm, Patient Position: Sitting, Cuff Size: Normal)   Pulse 82   Ht _0  (1.626 m)   Wt 166 lb (75.3 kg)   BMI 28.49 kg/m    Physical Exam  Constitutional: She is oriented to person, place, and time. She appears well-developed and well-nourished.  HENT:  Head: Normocephalic and atraumatic.  Eyes: Conjunctivae and EOM are normal.  Neck: Normal range of motion.  Cardiovascular: Normal rate, regular rhythm, normal heart sounds and intact distal pulses.  Pulmonary/Chest: Effort normal and breath sounds normal.  Abdominal: Soft. Bowel sounds are normal.  Lymphadenopathy:    She has no cervical adenopathy.  Neurological: She is alert and oriented to person, place, and time.  Skin: Skin is warm and dry. Capillary refill takes less than 2 seconds.  Psychiatric: She has a normal mood and affect. Her behavior is normal.  Nursing note and vitals reviewed.    Musculoskeletal Exam: C-spine and thoracic lumbar spine good range of motion. She does have some discomfort range of motion of her lumbar spine. Shoulder joints although joints wrist joint MCPs PIPs DIPs were good range of motion. She describes pain in multiple joints but there was no synovitis on examination.  CDAI Exam: CDAI Homunculus Exam:   Tenderness:  LUE: glenohumeral Right hand: 2nd MCP, 3rd MCP, 2nd PIP, 3rd PIP, 4th PIP and 5th PIP Left hand: 2nd MCP, 3rd MCP, 2nd PIP, 3rd PIP, 4th PIP and 5th PIP RLE: tibiofemoral LLE: tibiofemoral  Joint Counts:  CDAI Tender Joint count: 15 CDAI Swollen Joint count: 0  Global Assessments:  Patient Global Assessment: 8 Provider Global Assessment: 4  CDAI Calculated Score: 27    Investigation: No additional findings.TB Gold: Negative in 08/2016 CBC Latest Ref Rng & Units 09/09/2016 06/24/2016 04/24/2016  WBC 3.8  - 10.8 K/uL 7.3 5.9 10.9(H)  Hemoglobin 11.7 - 15.5 g/dL 12.6 11.8 11.8  Hematocrit 35.0 - 45.0 % 37.5 34.1 34.9(L)  Platelets 140 - 400 K/uL 373 342 319   CMP Latest Ref Rng & Units 09/09/2016 06/24/2016 04/24/2016  Glucose 65 - 99 mg/dL 65 90 84  BUN 7 - 25 mg/dL _1 Creatinine 0.50 - 1.10 mg/dL 0.95 0.98 1.03  Sodium 135 - 146 mmol/L 136 139 140  Potassium 3.5 - 5.3 mmol/L 4.8 4.1 3.9  Chloride 98 - 110 mmol/L 104 100 107  CO2 20 - 31 mmol/L _2 Calcium 8.6 - 10.2 mg/dL 9.5 9.4 9.3  Total Protein 6.1 - 8.1 g/dL 7.5 7.3 6.8  Total Bilirubin 0.2 - 1.2 mg/dL 0.3 0.2 0.3  Alkaline Phos 33 - 115 U/L 49 41 36  AST 10 - 30 U/L _3 ALT 6 - 29 U/L 20 17 45(H)    Imaging: No results found.  Speciality Comments: No specialty comments available.    Procedures:  No procedures performed Allergies: Fluoxetine; Hydrocodone-acetaminophen; Hydroxychloroquine; and Vicodin [hydrocodone-acetaminophen]   Assessment / Plan:  Visit Diagnoses: Rheumatoid arthritis involving multiple sites with positive rheumatoid factor (HCC) - +RF, +CCP. Patient complains of continued arthralgias and discomfort. She has tenderness in multiple joints but no synovitis was noted. She's been on Humira for about a month now. I would like to give Humira more time. I also discussed the option of subcutaneous methotrexate which she's taken in the past. She would like to hold off at this time.  High risk medication use - Humira 40 mg subcutaneous every other week, MTX 8 tablets by mouth every week, folic acid 2 mg by mouth daily - Plan: CBC with Differential/Platelet, COMPLETE METABOLIC PANEL WITH GFRtoday and then every 3 months to monitor for drug toxicity.  Chondromalacia of both patellae: She does have some crepitus in her knee joints.  Primary osteoarthritis of both feet: Proper fitting shoes were discussed.  Primary osteoarthritis of both hands: She has mild osteoarthritis. No synovitis was noted.  She had a lot of tenderness across her MCPs and PIP joints.  DDD (degenerative disc disease), cervical: Chronic pain  TB lung, latent - treated with INH 2017  - ok for biologics per office note from Dr Baxter [facility] infectious disease [facility] on 08/27/2015. She will need yearly chest x-ray.   Solitary pulmonary nodule  Cigarette smoker: Smoking cessation was discussed.Association of smoking with artery was discussed.  Her other medical problems are listed as follows:  Bipolar 2 disorder ([place])  History of gastroesophageal reflux (GERD)  History of hypertension: Blood pressure is mildly elevated. I've advised her to monitor blood pressure.  History of migraine    Orders: No orders of the defined types were placed in this encounter.  No orders of the defined types were placed in this encounter.     Follow-Up Instructions: Return in about 3 months (around 04/01/2017) for Rheumatoid arthritis, Osteoarthritis.   Bo Merino, MD  Note - This record has been created using Editor, commissioning.  Chart creation errors have been sought, but may not always  have been located. Such creation errors do not reflect on  the standard of medical care.

## 2016-12-16 NOTE — Telephone Encounter (Signed)
Last Visit: 09/09/16 Next Visit: 12/30/16 Labs: 09/09/16 WNL TB Gold: 09/09/16 Neg  Left message to advise patient she is due for labs.   Okay to refill 30 day supply per Dr. Estanislado Pandy

## 2016-12-17 MED FILL — HUMIRA PEN 40 MG/0.8ML PNKT: 40 | 28 days supply | Qty: 2 | Fill #0

## 2016-12-17 NOTE — Telephone Encounter (Signed)
Last Visit: 09/09/16 Next Visit: 12/30/16  Okay to refill per Dr. Estanislado Pandy

## 2016-12-25 ENCOUNTER — Telehealth: Payer: Self-pay

## 2016-12-25 NOTE — Telephone Encounter (Addendum)
Last visit: 09/09/2016 Next visit: 12/30/2016 Labs: 09/09/2016 WNL   Left message for patient to call the office.  Clarify po or subq MTX.

## 2016-12-30 ENCOUNTER — Ambulatory Visit (INDEPENDENT_AMBULATORY_CARE_PROVIDER_SITE_OTHER): Payer: Medicaid Other | Admitting: Rheumatology

## 2016-12-30 ENCOUNTER — Encounter: Payer: Self-pay | Admitting: Rheumatology

## 2016-12-30 VITALS — BP 117/82 | HR 82 | Ht 64.0 in | Wt 166.0 lb

## 2016-12-30 DIAGNOSIS — M19041 Primary osteoarthritis, right hand: Secondary | ICD-10-CM | POA: Diagnosis not present

## 2016-12-30 DIAGNOSIS — R7611 Nonspecific reaction to tuberculin skin test without active tuberculosis: Secondary | ICD-10-CM

## 2016-12-30 DIAGNOSIS — M19042 Primary osteoarthritis, left hand: Secondary | ICD-10-CM

## 2016-12-30 DIAGNOSIS — F3181 Bipolar II disorder: Secondary | ICD-10-CM | POA: Diagnosis not present

## 2016-12-30 DIAGNOSIS — M19071 Primary osteoarthritis, right ankle and foot: Secondary | ICD-10-CM

## 2016-12-30 DIAGNOSIS — R911 Solitary pulmonary nodule: Secondary | ICD-10-CM | POA: Diagnosis not present

## 2016-12-30 DIAGNOSIS — M2241 Chondromalacia patellae, right knee: Secondary | ICD-10-CM

## 2016-12-30 DIAGNOSIS — Z8679 Personal history of other diseases of the circulatory system: Secondary | ICD-10-CM | POA: Diagnosis not present

## 2016-12-30 DIAGNOSIS — Z79899 Other long term (current) drug therapy: Secondary | ICD-10-CM

## 2016-12-30 DIAGNOSIS — M19072 Primary osteoarthritis, left ankle and foot: Secondary | ICD-10-CM

## 2016-12-30 DIAGNOSIS — Z8719 Personal history of other diseases of the digestive system: Secondary | ICD-10-CM | POA: Diagnosis not present

## 2016-12-30 DIAGNOSIS — Z8669 Personal history of other diseases of the nervous system and sense organs: Secondary | ICD-10-CM

## 2016-12-30 DIAGNOSIS — Z227 Latent tuberculosis: Secondary | ICD-10-CM

## 2016-12-30 DIAGNOSIS — M5030 Other cervical disc degeneration, unspecified cervical region: Secondary | ICD-10-CM

## 2016-12-30 DIAGNOSIS — M0579 Rheumatoid arthritis with rheumatoid factor of multiple sites without organ or systems involvement: Secondary | ICD-10-CM

## 2016-12-30 DIAGNOSIS — M2242 Chondromalacia patellae, left knee: Secondary | ICD-10-CM

## 2016-12-30 DIAGNOSIS — F17210 Nicotine dependence, cigarettes, uncomplicated: Secondary | ICD-10-CM

## 2016-12-30 LAB — CBC WITH DIFFERENTIAL/PLATELET
Basophils Absolute: 41 cells/uL (ref 0–200)
Basophils Relative: 0.6 %
EOS PCT: 5.1 %
Eosinophils Absolute: 347 cells/uL (ref 15–500)
HCT: 35.4 % (ref 35.0–45.0)
Hemoglobin: 12.3 g/dL (ref 11.7–15.5)
LYMPHS ABS: 3522 {cells}/uL (ref 850–3900)
MCH: 29.8 pg (ref 27.0–33.0)
MCHC: 34.7 g/dL (ref 32.0–36.0)
MCV: 85.7 fL (ref 80.0–100.0)
MPV: 9.5 fL (ref 7.5–12.5)
Monocytes Relative: 7.3 %
NEUTROS PCT: 35.2 %
Neutro Abs: 2394 cells/uL (ref 1500–7800)
Platelets: 325 10*3/uL (ref 140–400)
RBC: 4.13 10*6/uL (ref 3.80–5.10)
RDW: 16.6 % — ABNORMAL HIGH (ref 11.0–15.0)
TOTAL LYMPHOCYTE: 51.8 %
WBC mixed population: 496 cells/uL (ref 200–950)
WBC: 6.8 10*3/uL (ref 3.8–10.8)

## 2016-12-30 LAB — COMPLETE METABOLIC PANEL WITH GFR
AG RATIO: 1.4 (calc) (ref 1.0–2.5)
ALBUMIN MSPROF: 4.2 g/dL (ref 3.6–5.1)
ALT: 27 U/L (ref 6–29)
AST: 23 U/L (ref 10–30)
Alkaline phosphatase (APISO): 47 U/L (ref 33–115)
BUN: 16 mg/dL (ref 7–25)
CHLORIDE: 107 mmol/L (ref 98–110)
CO2: 24 mmol/L (ref 20–32)
Calcium: 9 mg/dL (ref 8.6–10.2)
Creat: 1.03 mg/dL (ref 0.50–1.10)
GFR, EST AFRICAN AMERICAN: 81 mL/min/{1.73_m2} (ref >=60)
GFR, Est Non African American: 70 mL/min/{1.73_m2} (ref >=60)
GLOBULIN: 3.1 g/dL (ref 1.9–3.7)
Glucose, Bld: 78 mg/dL (ref 65–99)
POTASSIUM: 4.3 mmol/L (ref 3.5–5.3)
SODIUM: 138 mmol/L (ref 135–146)
TOTAL PROTEIN: 7.3 g/dL (ref 6.1–8.1)
Total Bilirubin: 0.3 mg/dL (ref 0.2–1.2)

## 2016-12-30 MED ORDER — METHOTREXATE 2.5 MG PO TABS: ORAL_TABLET | ORAL | 0 refills | Status: DC

## 2016-12-30 NOTE — Telephone Encounter (Signed)
Patient in office on 12/30/16 and clarified prescription is MTX po.

## 2016-12-30 NOTE — Patient Instructions (Signed)
Standing Labs We placed an order today for your standing lab work.    Please come back and get your standing labs in February and every 3 months  We have open lab Monday through Friday from 8:30-11:30 AM and 1:30-4 PM at the office of Dr. Any Mcneice.   The office is located at [address], [address], [place], [place] [address] No appointment is necessary.   Labs are drawn by [facility].  You may receive a bill from [facility] for your lab work. If you have any questions regarding directions or hours of operation,  please call [phone].    

## 2016-12-31 NOTE — Progress Notes (Signed)
Labs are stable.

## 2017-01-20 ENCOUNTER — Ambulatory Visit: Payer: Medicaid Other | Admitting: Certified Nurse Midwife

## 2017-01-20 ENCOUNTER — Other Ambulatory Visit: Payer: Self-pay

## 2017-01-20 ENCOUNTER — Encounter: Payer: Self-pay | Admitting: Certified Nurse Midwife

## 2017-01-20 VITALS — BP 125/87 | HR 89 | Ht 64.0 in | Wt 168.1 lb

## 2017-01-20 DIAGNOSIS — D259 Leiomyoma of uterus, unspecified: Secondary | ICD-10-CM

## 2017-01-20 DIAGNOSIS — Z3002 Counseling and instruction in natural family planning to avoid pregnancy: Secondary | ICD-10-CM | POA: Diagnosis not present

## 2017-01-21 ENCOUNTER — Encounter: Payer: Self-pay | Admitting: Certified Nurse Midwife

## 2017-01-21 DIAGNOSIS — D259 Leiomyoma of uterus, unspecified: Secondary | ICD-10-CM | POA: Insufficient documentation

## 2017-01-21 NOTE — Progress Notes (Signed)
Patient ID: Elizabeth Pope, female   DOB: 05/19/1980, 36 y.o.   MRN: [id number]  Chief Complaint  Patient presents with  . AUB    Pt c/o heavy periods    HPI Elizabeth Pope is a 36 y.o. female.  Here for f/u from annual exam 6 months ago.  Still is having AUB with her periods.  S/P BTL previously several years ago.  Work up 6 months ago was negative/normal blood work.  Small uterine fibroid X1 noted on [place] (2cm).  Options for AUB discussed.    HPI  Past Medical History:  Diagnosis Date  . Anxiety   . Bipolar 1 disorder ([place])   . DDD (degenerative disc disease), cervical 12/25/2015  . Depression   . GERD (gastroesophageal reflux disease) 12/25/2015  . Hypertension 12/25/2015  . Migraine   . Migraines 12/25/2015  . 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/2015   mild  . Osteoarthritis of both hands 12/25/2015  . RA (rheumatoid arthritis) ([place]) 12/25/2015   Positive RF, Elevated ESR, Positive CCP > 250     Past Surgical History:  Procedure Laterality Date  . CESAREAN SECTION    . TUBAL LIGATION      Family History  Problem Relation Age of Onset  . Bronchitis Daughter   . Allergies Unknown        "everybody"  . Rheum arthritis Mother   . Migraines Mother   . Hypertension Mother   . Colitis Mother   . Rheum arthritis Maternal Grandmother   . Rheum arthritis Maternal Aunt   . Migraines Sister   . Migraines Brother     Social History Social History   Tobacco Use  . Smoking status: Current Every Day Smoker    Packs/day: 0.50    Years: 19.00    Pack years: 9.50    Types: Cigarettes, E-cigarettes    Last attempt to quit: 01/24/2016    Years since quitting: 0.9  . Smokeless tobacco: Never Used  . Tobacco comment: Pt admits to vaping  Substance Use Topics  . Alcohol use: Yes    Alcohol/week: 0.0 oz    Comment: occ  . Drug use: No    Allergies  Allergen Reactions  . Fluoxetine     Muscle spasms Other reaction(s): Other (See Comments) Muscle spasms   . Hydrocodone-Acetaminophen Itching  . Hydroxychloroquine Diarrhea    diarrhea diarrhea  . Vicodin [Hydrocodone-Acetaminophen] Itching    Current Outpatient Medications  Medication Sig Dispense Refill  . amLODipine (NORVASC) 5 MG tablet Take 1 tablet by mouth daily. Reported on 08/27/2015  5  . cetirizine (ZYRTEC) 10 MG tablet TK 1 T PO D PRN  0  . doxycycline (VIBRA-TABS) 100 MG tablet Take 100 mg 2 (two) times daily by mouth.    . folic acid (FOLVITE) 1 MG tablet TAKE 2 TABLETS BY MOUTH EVERY DAY 180 tablet 0  . HUMIRA PEN 40 MG/0.8ML PNKT INJECT 40 MG INTO THE SKIN EVERY 14 (FOURTEEN) DAYS. 1 each 0  . hydrOXYzine (ATARAX/VISTARIL) 25 MG tablet Take 25 mg by mouth 3 (three) times daily as needed.    . lamoTRIgine (LAMICTAL) 100 MG tablet Take 100 mg daily by mouth.    . methotrexate (RHEUMATREX) 2.5 MG tablet TAKE 8 TABLETS BY MOUTH ONCE A WEEK, CAUTION:CHEMOTHERAPY, PROTECT FROM LIGHT 96 tablet 0  . pyridOXINE (VITAMIN B-6) 50 MG tablet Take 1 tablet (50 mg total) by mouth daily. 60 tablet 0  . RESTASIS 0.05 % ophthalmic emulsion 1 drop  twice daily  3  . RETIN-A 0.025 % cream Apply 1 application daily topically.  2  . sertraline (ZOLOFT) 100 MG tablet Take 100 mg daily by mouth.  1  . traZODone (DESYREL) 50 MG tablet Take 100 mg by mouth at bedtime.  1  . triamcinolone cream (KENALOG) 0.1 % Apply 1 application daily topically.    . vitamin B-12 (CYANOCOBALAMIN) 100 MCG tablet Take 100 mcg daily by mouth.    . predniSONE (DELTASONE) 5 MG tablet 4 tab q am x4days, 3 tab q am x4d, 2 tab q amx4 days, 1 tab q am x4d ,1/2 tab qam x4d then  d/c (Patient not taking: Reported on 12/30/2016) 42 tablet 0   No current facility-administered medications for this visit.     Review of Systems Review of Systems Constitutional: negative for fatigue and weight loss Respiratory: negative for cough and wheezing Cardiovascular: negative for chest pain, fatigue and palpitations Gastrointestinal:  negative for abdominal pain and change in bowel habits Genitourinary:+AUB with periods Integument/breast: negative for nipple discharge Musculoskeletal:negative for myalgias Neurological: negative for gait problems and tremors Behavioral/Psych: negative for abusive relationship, depression Endocrine: negative for temperature intolerance      Blood pressure 125/87, pulse 89, height 5' 4"  (1.626 m), weight 168 lb 1.6 oz (76.2 kg), last menstrual period 12/27/2016.  Physical Exam Physical Exam: deferred   100% of 15 min visit spent on counseling and coordination of care.   Data Reviewed Previous medical hx, meds, labs, TUVS results  Assessment     Small uterine fibroid AUB most likely related to hx of BTL    Plan   F/U in 1 week for IUD insertion (Mirena).  All questions answered.

## 2017-01-28 ENCOUNTER — Encounter: Payer: Self-pay | Admitting: *Deleted

## 2017-01-28 ENCOUNTER — Ambulatory Visit: Payer: Medicaid Other | Admitting: Certified Nurse Midwife

## 2017-01-28 ENCOUNTER — Other Ambulatory Visit (HOSPITAL_COMMUNITY)
Admission: RE | Admit: 2017-01-28 | Discharge: 2017-01-28 | Disposition: A | Discharge status: Home / Self Care | Payer: Medicaid Other | Source: Ambulatory Visit | Attending: Certified Nurse Midwife | Admitting: Certified Nurse Midwife

## 2017-01-28 ENCOUNTER — Encounter: Payer: Self-pay | Admitting: Certified Nurse Midwife

## 2017-01-28 VITALS — BP 132/91 | HR 93 | Wt 165.4 lb

## 2017-01-28 DIAGNOSIS — Z30430 Encounter for insertion of intrauterine contraceptive device: Secondary | ICD-10-CM | POA: Insufficient documentation

## 2017-01-28 DIAGNOSIS — R87625 Unsatisfactory cytologic smear of vagina: Secondary | ICD-10-CM

## 2017-01-28 DIAGNOSIS — Z01812 Encounter for preprocedural laboratory examination: Secondary | ICD-10-CM

## 2017-01-28 LAB — POCT URINE PREGNANCY: PREG TEST UR: NEGATIVE

## 2017-01-28 MED ORDER — LEVONORGESTREL 20 MCG/24HR IU IUD
INTRAUTERINE_SYSTEM | Freq: Once | INTRAUTERINE | Status: AC
  Administered 2017-01-28: 15:00:00 via INTRAUTERINE

## 2017-01-28 NOTE — Progress Notes (Signed)
IUD Procedure Note   DIAGNOSIS: Desires long-term, reversible contraception   PROCEDURE: IUD placement Performing Provider: Kandis Cocking CNM  Patient counseled prior to procedure. I explained risks and benefits of mirena IUD, reviewed alternative forms of contraception. Patient stated understanding and consented to continue with procedure.   LMP: currently on cycle, hx of BTL Pregnancy Test: Negative Lot #: [id number] Expiration Date: Apr 2021   IUD type: [X]  Mirena   [  ] Paragard  [   ] Diamantina Monks   [  ]  K[place]ena  PROCEDURE:  Timeout procedure was performed to ensure right patient and right site.  A bimanual exam was performed to determine the position of the uterus, retroverted. The speculum was placed. The vagina and cervix was sterilized in the usual manner and sterile technique was maintained throughout the course of the procedure. A single toothed tenaculum was applied to the posterior lip of the cervix and gentle traction applied. The depth of the uterus was sounded to 10 cm. With gentle traction on the tenaculum, the IUD was inserted to the appropriate depth and inserted without difficulty.  The string was cut to an estimated 4 cm length. Bleeding was minimal. The patient tolerated the procedure well.   Follow up: The patient tolerated the procedure well without complications.  Standard post-procedure care is explained and return precautions are given.  Kandis Cocking CNM

## 2017-01-29 ENCOUNTER — Other Ambulatory Visit: Payer: Self-pay | Admitting: *Deleted

## 2017-01-29 MED ORDER — ADALIMUMAB 40 MG/0.8ML ~~LOC~~ AJKT: 40.0000 mg | AUTO-INJECTOR | SUBCUTANEOUS | 0 refills | Status: DC

## 2017-01-29 MED FILL — HUMIRA PEN 40 MG/0.8ML PNKT: 40 | 28 days supply | Qty: 2 | Fill #0

## 2017-01-29 NOTE — Telephone Encounter (Signed)
Refill request for Humira received via fax  Last visit: 12/30/16 Next Visit: 05/21/17 Labs: 12/30/16 stable TB Gold: 09/09/16 Neg  Okay to refill per Dr. Rob Hickman

## 2017-01-31 LAB — CYTOLOGY - PAP
DIAGNOSIS: UNDETERMINED — AB
HPV 16/18/45 GENOTYPING: NEGATIVE
HPV: DETECTED — AB

## 2017-02-03 ENCOUNTER — Other Ambulatory Visit: Payer: Self-pay | Admitting: Certified Nurse Midwife

## 2017-02-03 DIAGNOSIS — R87610 Atypical squamous cells of undetermined significance on cytologic smear of cervix (ASC-US): Secondary | ICD-10-CM | POA: Insufficient documentation

## 2017-02-25 ENCOUNTER — Telehealth: Payer: Self-pay | Admitting: Rheumatology

## 2017-02-25 ENCOUNTER — Ambulatory Visit: Payer: Medicaid Other | Admitting: Nurse Practitioner

## 2017-02-25 NOTE — Telephone Encounter (Signed)
Patient called stating that she stopped taking her Humira injection because her face broke out.  She has not taken her last two injections and wants to know if there is something else she could take.  She does not want Prednisone.  CB#[phone]

## 2017-02-26 ENCOUNTER — Ambulatory Visit: Payer: Medicaid Other | Admitting: Rheumatology

## 2017-02-26 ENCOUNTER — Encounter: Payer: Self-pay | Admitting: Rheumatology

## 2017-02-26 ENCOUNTER — Telehealth: Payer: Self-pay

## 2017-02-26 VITALS — BP 136/80 | HR 74 | Resp 16 | Ht 65.0 in | Wt 170.0 lb

## 2017-02-26 DIAGNOSIS — M19071 Primary osteoarthritis, right ankle and foot: Secondary | ICD-10-CM

## 2017-02-26 DIAGNOSIS — Z79899 Other long term (current) drug therapy: Secondary | ICD-10-CM | POA: Diagnosis not present

## 2017-02-26 DIAGNOSIS — Z8679 Personal history of other diseases of the circulatory system: Secondary | ICD-10-CM

## 2017-02-26 DIAGNOSIS — M19041 Primary osteoarthritis, right hand: Secondary | ICD-10-CM | POA: Diagnosis not present

## 2017-02-26 DIAGNOSIS — Z8659 Personal history of other mental and behavioral disorders: Secondary | ICD-10-CM

## 2017-02-26 DIAGNOSIS — M0579 Rheumatoid arthritis with rheumatoid factor of multiple sites without organ or systems involvement: Secondary | ICD-10-CM | POA: Diagnosis not present

## 2017-02-26 DIAGNOSIS — R7611 Nonspecific reaction to tuberculin skin test without active tuberculosis: Secondary | ICD-10-CM

## 2017-02-26 DIAGNOSIS — M2241 Chondromalacia patellae, right knee: Secondary | ICD-10-CM | POA: Diagnosis not present

## 2017-02-26 DIAGNOSIS — Z8669 Personal history of other diseases of the nervous system and sense organs: Secondary | ICD-10-CM | POA: Diagnosis not present

## 2017-02-26 DIAGNOSIS — Z8719 Personal history of other diseases of the digestive system: Secondary | ICD-10-CM

## 2017-02-26 DIAGNOSIS — F17210 Nicotine dependence, cigarettes, uncomplicated: Secondary | ICD-10-CM

## 2017-02-26 DIAGNOSIS — M19042 Primary osteoarthritis, left hand: Secondary | ICD-10-CM

## 2017-02-26 DIAGNOSIS — M5030 Other cervical disc degeneration, unspecified cervical region: Secondary | ICD-10-CM

## 2017-02-26 DIAGNOSIS — M19072 Primary osteoarthritis, left ankle and foot: Secondary | ICD-10-CM

## 2017-02-26 DIAGNOSIS — M2242 Chondromalacia patellae, left knee: Secondary | ICD-10-CM

## 2017-02-26 DIAGNOSIS — Z227 Latent tuberculosis: Secondary | ICD-10-CM

## 2017-02-26 DIAGNOSIS — R911 Solitary pulmonary nodule: Secondary | ICD-10-CM

## 2017-02-26 MED ORDER — PREDNISONE 5 MG PO TABS: ORAL_TABLET | ORAL | 0 refills | Status: DC

## 2017-02-26 NOTE — Telephone Encounter (Signed)
Patient has been schedule to come in for 02/26/17 at 3/15 pm

## 2017-02-26 NOTE — Progress Notes (Signed)
Office Visit Note  Patient: Elizabeth Pope             Date of Birth: [birth year]           MRN: [id number]             PCP: Kristie Cowman, MD Referring: Kristie Cowman, MD Visit Date: 02/26/2017 Occupation: _0 @    Subjective:  Other (discuss medication, Humira caused her face to break out )   History of Present Illness: Elizabeth Pope is a 37 y.o. female with history of sero positive rheumatoid arthritis and osteoarthritis. She states she had been doing really well on Humira but about 3 weeks ago she developed a rash she decided to come off Humira and the rash has been resolving. She states while she was on Humira her joint symptoms were very well controlled and then the symptoms recurred. The rash was mostly on her face and covered her whole face. It was pruritic. She's been having pain and discomfort in her multiple joints currently. Most of her pain is in her bilateral hands and bilateral knee joints. Her hands have been swelling. She has some stiffness in her neck.   Activities of Daily Living:  Patient reports morning stiffness for 30 minutes.   Patient Reports nocturnal pain.  Difficulty dressing/grooming: Denies Difficulty climbing stairs: Reports Difficulty getting out of chair: Reports Difficulty using hands for taps, buttons, cutlery, and/or writing: Reports   Review of Systems  Constitutional: Positive for fatigue. Negative for night sweats, weight gain, weight loss and weakness.  HENT: Negative for mouth sores, trouble swallowing, trouble swallowing, mouth dryness and nose dryness.   Eyes: Negative for pain, redness, visual disturbance and dryness.  Respiratory: Negative for cough, shortness of breath and difficulty breathing.   Cardiovascular: Negative for chest pain, palpitations, hypertension, irregular heartbeat and swelling in legs/feet.  Gastrointestinal: Positive for diarrhea. Negative for blood in stool and constipation.  Endocrine: Negative  for increased urination.  Genitourinary: Negative for vaginal dryness.  Musculoskeletal: Positive for arthralgias, joint pain, joint swelling and morning stiffness. Negative for myalgias, muscle weakness, muscle tenderness and myalgias.  Skin: Positive for rash. Negative for color change, hair loss, skin tightness, ulcers and sensitivity to sunlight.       On face  Allergic/Immunologic: Negative for susceptible to infections.  Neurological: Negative for dizziness, memory loss and night sweats.  Hematological: Negative for swollen glands.  Psychiatric/Behavioral: Positive for depressed mood and sleep disturbance. The patient is nervous/anxious.     PMFS History:  Patient Active Problem List   Diagnosis Date Noted  . Atypical squamous cell changes of undetermined significance (ASCUS) on cervical cytology with negative high risk human papilloma virus (HPV) test result 02/03/2017  . Encounter for IUD insertion 01/28/2017  . Uterine fibroid 01/21/2017  . H/O tubal ligation 06/24/2016  . High risk medication use 01/07/2016  . GERD (gastroesophageal reflux disease) 12/25/2015  . Migraines 12/25/2015  . Hypertension 12/25/2015  . RA (rheumatoid arthritis) ([place]) 12/25/2015  . DDD (degenerative disc disease), cervical 12/25/2015  . Osteoarthritis of both hands 12/25/2015  . 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/2015  . 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 Bipolar 2 disorder ([place]) 05/14/2015  . Cigarette smoker 05/05/2015  . Chest pain 05/05/2015  . Solitary pulmonary nodule 05/04/2015    Past Medical History:  Diagnosis Date  . Anxiety   . Bipolar 1 disorder ([place])   . DDD (degenerative disc disease), cervical 12/25/2015  .  Depression   . GERD (gastroesophageal reflux disease) 12/25/2015  . Hypertension 12/25/2015  . Migraine   . Migraines 12/25/2015  . 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/2015   mild  . Osteoarthritis of both hands 12/25/2015  . RA  (rheumatoid arthritis) (HCC) 12/25/2015   Positive RF, Elevated ESR, Positive CCP > 250     Family History  Problem Relation Age of Onset  . Rheum arthritis Mother   . Migraines Mother   . Hypertension Mother   . Colitis Mother   . Rheum arthritis Maternal Grandmother   . Rheum arthritis Maternal Aunt   . Migraines Sister   . Migraines Brother   . Sickle cell anemia Daughter   . Bipolar disorder Daughter   . Anxiety disorder Daughter   . Depression Daughter   . Migraines Daughter   . Arrhythmia Daughter   . Migraines Son   . ADD / ADHD Son   . Seizures Son   . Arrhythmia Son    Past Surgical History:  Procedure Laterality Date  . CESAREAN SECTION    . TUBAL LIGATION     Social History   Social History Narrative  . Not on file     Objective: Vital Signs: BP 136/80 (BP Location: Left Arm, Patient Position: Sitting, Cuff Size: Normal)   Pulse 74   Resp 16   Ht 5' 5" (1.651 m)   Wt 170 lb (77.1 kg)   BMI 28.29 kg/m    Physical Exam  Constitutional: She is oriented to person, place, and time. She appears well-developed and well-nourished.  HENT:  Head: Normocephalic and atraumatic.  Eyes: Conjunctivae and EOM are normal.  Neck: Normal range of motion.  Cardiovascular: Normal rate, regular rhythm, normal heart sounds and intact distal pulses.  Pulmonary/Chest: Effort normal and breath sounds normal.  Abdominal: Soft. Bowel sounds are normal.  Lymphadenopathy:    She has no cervical adenopathy.  Neurological: She is alert and oriented to person, place, and time.  Skin: Skin is warm and dry. Capillary refill takes less than 2 seconds.  Psychiatric: She has a normal mood and affect. Her behavior is normal.  Nursing note and vitals reviewed.    Musculoskeletal Exam: C-spine and thoracic lumbar spine good range of motion. She is painful range of motion of her left shoulder joint which was limited. Elbow joints and wrist joints are good range of motion. She is some  tenderness and swelling over right third PIP joint. Hip joints knee joints ankles and MTPs were good range of motion. She is some discomfort with range of motion of bilateral knee joints.  CDAI Exam: CDAI Homunculus Exam:   Tenderness:  Right hand: 3rd PIP RLE: tibiofemoral LLE: tibiofemoral  Swelling:  LUE: glenohumeral Right hand: 3rd PIP  Joint Counts:  CDAI Tender Joint count: 3 CDAI Swollen Joint count: 2  Global Assessments:  Patient Global Assessment: 5 Provider Global Assessment: 5  CDAI Calculated Score: 15    Investigation: No additional findings.TB Gold: 09/09/2016 Negative  CBC Latest Ref Rng & Units 12/30/2016 09/09/2016 06/24/2016  WBC 3.8 - 10.8 Thousand/uL 6.8 7.3 5.9  Hemoglobin 11.7 - 15.5 g/dL 12.3 12.6 11.8  Hematocrit 35.0 - 45.0 % 35.4 37.5 34.1  Platelets 140 - 400 Thousand/uL 325 373 342   CMP Latest Ref Rng & Units 12/30/2016 09/09/2016 06/24/2016  Glucose 65 - 99 mg/dL 78 65 90  BUN 7 - 25 mg/dL _0 Creatinine 0.50 - 1.10 mg/dL 1.03 0.95 0.98  Sodium 135 - 146 mmol/L 138 136 139  Potassium 3.5 - 5.3 mmol/L 4.3 4.8 4.1  Chloride 98 - 110 mmol/L 107 104 100  CO2 20 - 32 mmol/L _0 Calcium 8.6 - 10.2 mg/dL 9.0 9.5 9.4  Total Protein 6.1 - 8.1 g/dL 7.3 7.5 7.3  Total Bilirubin 0.2 - 1.2 mg/dL 0.3 0.3 0.2  Alkaline Phos 33 - 115 U/L - 49 41  AST 10 - 30 U/L _1 ALT 6 - 29 U/L _2 Imaging: No results found.  Speciality Comments: No specialty comments available.    Procedures:  No procedures performed Allergies: Fluoxetine; Hydrocodone-acetaminophen; Hydroxychloroquine; and Vicodin [hydrocodone-acetaminophen]   Assessment / Plan:     Visit Diagnoses: Rheumatoid arthritis involving multiple sites with positive rheumatoid factor (HCC) - +RF, +CCP. Patient is having a flare off Humira. She did respond very well to Humira and methotrexate combination. She developed facial rash on Humira for that reason it was discontinued.  Different treatment options and their side effects were discussed at length. We discussed possible option of adding Enbrel. Indications side effects contraindications were discussed. A handout was given. We will apply for Enbrel once approved she can come in for her first injection. She also requested a prednisone taper. I will start her on prednisone 20 mg by mouth daily and taper by 5 mg every 4 days. Patient will call us when the rash is resolved she can start her on first Enbrel injection.  Medication counseling:    Does patient have diagnosis of heart failure?  No  Counseled patient that Enbrel is a TNF blocking agent.  Reviewed Enbrel dose of 50 mg once weekly.  Counseled patient on purpose, proper use, and adverse effects of Enbrel.  Reviewed the most common adverse effects including infections, headache, and injection site reactions. Discussed that there is the possibility of an increased risk of malignancy but it is not well understood if this increased risk is due to the medication or the disease state.  Advised patient to get yearly dermatology exams due to risk of skin cancer.  Reviewed the importance of regular labs while on Enbrel therapy.  Advised patient to get standing labs one month after starting Enbrel then every 2 months.  Provided patient with standing lab orders.  Counseled patient that Enbrel should be held prior to scheduled surgery.  Counseled patient to avoid live vaccines while on Enbrel.  Advised patient to get annual influenza vaccine and the pneumococcal vaccine as needed.  Provided patient with medication education material and answered all questions.  Patient voiced understanding.  Patient consented to Enbrel.  Will upload consent into the media tab.  Reviewed storage instructions for Enbrel.  Advised initial injection must be administered in office.  Patient voiced understanding.    High risk medication use - MTX 8 tabs po q wk, folic acid 84m po qd, Humira (rash). Humira  was discontinued with patient about 3 weeks ago.  Chondromalacia of both patellae: Chronic pain  Primary osteoarthritis of both hands: She is some stiffness in her hands.  Primary osteoarthritis of both feet: Proper fitting shoes were discussed.  DDD (degenerative disc disease), cervical: She is not having much cervical pain currently.  TB lung, latent - treated with INH 2017  - ok for biologics [facility].   Solitary pulmonary nodule  Cigarette smoker  History of migraine  History of bipolar disorder  History of hypertension  History of gastroesophageal reflux (GERD)   Patient gives history of chronic diarrhea. Have advised to discuss that with her PCP.   Orders: No orders of the defined types were placed in this encounter.  Meds ordered this encounter  Medications  . predniSONE (DELTASONE) 5 MG tablet    Sig: 4 tab q am x4days, 3 tab q am x4d, 2 tab q am x4 days, 1 tab q am x4d, then stop    Dispense:  40 tablet    Refill:  0    Face-to-face time spent with patient was 30 minutes. Greater than 50% of time was spent in counseling and coordination of care.  Follow-Up Instructions: Return in about 3 months (around 05/27/2017) for Rheumatoid arthritis.   Bo Merino, MD  Note - This record has been created using Editor, commissioning.  Chart creation errors have been sought, but may not always  have been located. Such creation errors do not reflect on  the standard of medical care.

## 2017-02-26 NOTE — Patient Instructions (Signed)
Standing Labs We placed an order today for your standing lab work.    Please come back and get your standing labs in February and every 3 months  We have open lab Monday through Friday from 8:30-11:30 AM and 1:30-4 PM at the office of Dr. Bo Merino.   The office is located at [address] [place], [place], [place] [address] No appointment is necessary.   Labs are drawn by [facility].  You may receive a bill from [facility] for your lab work. If you have any questions regarding directions or hours of operation,  please call [phone].    Etanercept injection What is this medicine? ETANERCEPT (et a [employer]) is used for the treatment of rheumatoid arthritis in adults and children. The medicine is also used to treat psoriatic arthritis, ankylosing spondylitis, and psoriasis. This medicine may be used for other purposes; ask your health care provider or pharmacist if you have questions. COMMON BRAND NAME(S): Enbrel What should I tell my health care provider before I take this medicine? They need to know if you have any of these conditions: -blood disorders -cancer -congestive heart failure -diabetes -exposure to chickenpox -immune system problems -infection -multiple sclerosis -seizure disorder -tuberculosis, a positive skin test for tuberculosis or have recently been in close contact with someone who has tuberculosis -Wegener's granulomatosis -an unusual or allergic reaction to etanercept, latex, other medicines, foods, dyes, or preservatives -pregnant or trying to get pregnant -breast-feeding How should I use this medicine? The medicine is given by injection under the skin. You will be taught how to prepare and give this medicine. Use exactly as directed. Take your medicine at regular intervals. Do not take your medicine more often than directed. It is important that you put your used needles and syringes in a special sharps container. Do not put them in a trash can. If you  do not have a sharps container, call your pharmacist or healthcare provider to get one. A special MedGuide will be given to you by the pharmacist with each prescription and refill. Be sure to read this information carefully each time. Talk to your pediatrician regarding the use of this medicine in children. While this drug may be prescribed for children as young as 97 years of age for selected conditions, precautions do apply. Overdosage: If you think you have taken too much of this medicine contact a poison control center or emergency room at once. NOTE: This medicine is only for you. Do not share this medicine with others. What if I miss a dose? If you miss a dose, contact your health care professional to find out when you should take your next dose. Do not take double or extra doses without advice. What may interact with this medicine? Do not take this medicine with any of the following medications: -anakinra This medicine may also interact with the following medications: -cyclophosphamide -sulfasalazine -vaccines This list may not describe all possible interactions. Give your health care provider a list of all the medicines, herbs, non-prescription drugs, or dietary supplements you use. Also tell them if you smoke, drink alcohol, or use illegal drugs. Some items may interact with your medicine. What should I watch for while using this medicine? Tell your doctor or healthcare professional if your symptoms do not start to get better or if they get worse. You will be tested for tuberculosis (TB) before you start this medicine. If your doctor prescribes any medicine for TB, you should start taking the TB medicine before starting this  medicine. Make sure to finish the full course of TB medicine. Call your doctor or health care professional for advice if you get a fever, chills or sore throat, or other symptoms of a cold or flu. Do not treat yourself. This drug decreases your body's ability to fight  infections. Try to avoid being around people who are sick. What side effects may I notice from receiving this medicine? Side effects that you should report to your doctor or health care professional as soon as possible: -allergic reactions like skin rash, itching or hives, swelling of the face, lips, or tongue -changes in vision -fever, chills or any other sign of infection -numbness or tingling in legs or other parts of the body -red, scaly patches or raised bumps on the skin -shortness of breath or difficulty breathing -swollen lymph nodes in the neck, underarm, or groin areas -unexplained weight loss -unusual bleeding or bruising -unusual swelling or fluid retention in the legs -unusually weak or tired Side effects that usually do not require medical attention (report to your doctor or health care professional if they continue or are bothersome): -dizziness -headache -nausea -redness, itching, or swelling at the injection site -vomiting This list may not describe all possible side effects. Call your doctor for medical advice about side effects. You may report side effects to FDA at 1-800-FDA-1088. Where should I keep my medicine? Keep out of the reach of children. Store between 2 and 8 degrees C (36 and 46 degrees F). Do not freeze or shake. Protect from light. Throw away any unused medicine after the expiration date. You will be instructed on how to store this medicine. NOTE: This sheet is a summary. It may not cover all possible information. If you have questions about this medicine, talk to your doctor, pharmacist, or health care provider.  2018 Elsevier/Gold Standard (2011-08-18 15:33:36)

## 2017-02-26 NOTE — Telephone Encounter (Signed)
Was asked to complete a prior authorization for Enbrel. Authorization was submitted over the phone with [employer] with representative Katie. We should have a response within 24 hours.   PA # [id number] Interaction ID: [id number] Phone: [phone]  Will update once we receive a response.  Daianna Vasques, [place], CPhT 4:45 PM

## 2017-03-02 NOTE — Telephone Encounter (Signed)
Called [employer] to check the status of pts authorization. Spoke with Thurman Coyer who states that Enbrel has been approved for pt from 02/26/2017 through 02/21/2018. No approval letter will be sent out.   Authorization number: [id number] Phone: [phone] Interaction ID: [id number]  Called patient to update. We will send Rx for Enbrel [facility] to [facility]. Once we receive the medication we will schedule nursing visit for first injection. Pt voices understanding and denies any questions at this time.   Please send new Rx for Enbrel [facility] to [facility]. Thanks!   Marylan Glore, [place], CPhT 11:14 AM

## 2017-03-03 ENCOUNTER — Ambulatory Visit: Payer: Medicaid Other | Admitting: Certified Nurse Midwife

## 2017-03-03 MED ORDER — ETANERCEPT 50 MG/ML ~~LOC~~ SOAJ: 50.0000 mg | SUBCUTANEOUS | 0 refills | Status: DC

## 2017-03-03 MED FILL — ENBREL 50 MG/ML SURECLICK S: 50 | 28 days supply | Qty: 4 | Fill #0

## 2017-03-03 NOTE — Telephone Encounter (Signed)
Prescription for Enbrel has been sent to the [facility]. Will schedule nurse visit once the medication has been delivered to the office.

## 2017-03-03 NOTE — Addendum Note (Signed)
Addended by: Carole Binning on: 03/03/2017 08:53 AM   Modules accepted: Orders

## 2017-03-04 ENCOUNTER — Ambulatory Visit (INDEPENDENT_AMBULATORY_CARE_PROVIDER_SITE_OTHER): Payer: Medicaid Other | Admitting: Certified Nurse Midwife

## 2017-03-04 ENCOUNTER — Encounter: Payer: Self-pay | Admitting: Certified Nurse Midwife

## 2017-03-04 VITALS — BP 130/85 | HR 67 | Ht 65.0 in | Wt 166.8 lb

## 2017-03-04 DIAGNOSIS — Z30431 Encounter for routine checking of intrauterine contraceptive device: Secondary | ICD-10-CM | POA: Diagnosis not present

## 2017-03-04 NOTE — Progress Notes (Signed)
Patient is in the office for IUD string check, inserted 01-28-17. Patient also states that she has not taken BP meds today.

## 2017-03-09 ENCOUNTER — Encounter: Payer: Self-pay | Admitting: Certified Nurse Midwife

## 2017-03-09 NOTE — Progress Notes (Signed)
Subjective:    Elizabeth Pope  who presents for contraception counseling. The patient has no complaints today. The patient is sexually active. Pertinent past medical history: none.  Likes her Mirena IUD.  Is not currently bleeding with the IUD.    The information documented in the HPI was reviewed and verified.  Menstrual History: OB History    Gravida Para Term Preterm AB Living   1 1 1     1    SAB TAB Ectopic Multiple Live Births         0 1      No LMP recorded. has Mirena IUD   Patient Active Problem List   Diagnosis Date Noted   Patient Active Problem List   Diagnosis Date Noted  . Atypical squamous cell changes of undetermined significance (ASCUS) on cervical cytology with negative high risk human papilloma virus (HPV) test result 02/03/2017  . Encounter for IUD insertion 01/28/2017  . Uterine fibroid 01/21/2017  . H/O tubal ligation 06/24/2016  . High risk medication use 01/07/2016  . GERD (gastroesophageal reflux disease) 12/25/2015  . Migraines 12/25/2015  . Hypertension 12/25/2015  . RA (rheumatoid arthritis) ([place]) 12/25/2015  . DDD (degenerative disc disease), cervical 12/25/2015  . Osteoarthritis of both hands 12/25/2015  . 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/2015  . 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 Bipolar 2 disorder ([place]) 05/14/2015  . Cigarette smoker 05/05/2015  . Chest pain 05/05/2015  . Solitary pulmonary nodule 05/04/2015    No past medical history on file.  Past Surgical History:  Procedure Laterality Date   Past Surgical History:  Procedure Laterality Date  . CESAREAN SECTION    . TUBAL LIGATION        Current Outpatient Prescriptions:  No medication comments found. Current Outpatient Medications on File Prior to Visit  Medication Sig Dispense Refill  . amLODipine (NORVASC) 5 MG tablet Take 1 tablet by mouth daily. Reported on 08/27/2015  5  . cetirizine (ZYRTEC) 10 MG tablet TK 1 T PO D PRN   0  . doxycycline (VIBRA-TABS) 100 MG tablet Take 100 mg 2 (two) times daily by mouth.    . esomeprazole (NEXIUM) 10 MG packet Take 10 mg by mouth daily before breakfast.    . folic acid (FOLVITE) 1 MG tablet TAKE 2 TABLETS BY MOUTH EVERY DAY 180 tablet 0  . hydrOXYzine (ATARAX/VISTARIL) 25 MG tablet Take 25 mg by mouth 3 (three) times daily as needed.    . lamoTRIgine (LAMICTAL) 100 MG tablet Take 100 mg daily by mouth.    . methotrexate (RHEUMATREX) 2.5 MG tablet TAKE 8 TABLETS BY MOUTH ONCE A WEEK, CAUTION:CHEMOTHERAPY, PROTECT FROM LIGHT 96 tablet 0  . predniSONE (DELTASONE) 5 MG tablet 4 tab q am x4days, 3 tab q am x4d, 2 tab q am x4 days, 1 tab q am x4d, then stop 40 tablet 0  . pyridOXINE (VITAMIN B-6) 50 MG tablet Take 1 tablet (50 mg total) by mouth daily. 60 tablet 0  . vitamin B-12 (CYANOCOBALAMIN) 100 MCG tablet Take 100 mcg daily by mouth.    . Adalimumab (HUMIRA PEN) 40 MG/0.8ML PNKT Inject 40 mg into the skin every 14 (fourteen) days. (Patient not taking: Reported on 02/26/2017) 1 each 0  . etanercept (ENBREL SURECLICK) 50 MG/ML injection Inject 0.98 mLs (50 mg total) into the skin once a week. (Patient not taking: Reported on 03/04/2017) 11.76 mL 0  . predniSONE (DELTASONE) 5 MG tablet 4  tab q am x4days, 3 tab q am x4d, 2 tab q amx4 days, 1 tab q am x4d ,1/2 tab qam x4d then  d/c (Patient not taking: Reported on 02/26/2017) 42 tablet 0  . RESTASIS 0.05 % ophthalmic emulsion 1 drop twice daily  3  . RETIN-A 0.025 % cream Apply 1 application daily topically.  2  . sertraline (ZOLOFT) 100 MG tablet Take 100 mg daily by mouth.  1  . traZODone (DESYREL) 50 MG tablet Take 100 mg by mouth at bedtime.  1  . triamcinolone cream (KENALOG) 0.1 % Apply 1 application daily topically.     No current facility-administered medications on file prior to visit.     Allergies  Allergen Reactions   Allergies  Allergen Reactions  . Fluoxetine     Muscle spasms Other reaction(s): Other (See  Comments) Muscle spasms  . Humira [Adalimumab] Rash  . Hydrocodone-Acetaminophen Itching  . Hydroxychloroquine Diarrhea    diarrhea diarrhea  . Other Rash  . Vicodin [Hydrocodone-Acetaminophen] Itching     Social History  Substance Use Topics   Social History   Socioeconomic History  . Marital status: Single    Spouse name: Not on file  . Number of children: Not on file  . Years of education: Not on file  . Highest education level: Not on file  Social Needs  . Financial resource strain: Not on file  . Food insecurity - worry: Not on file  . Food insecurity - inability: Not on file  . Transportation needs - medical: Not on file  . Transportation needs - non-medical: Not on file  Occupational History  . Not on file  Tobacco Use  . Smoking status: Former Smoker    Packs/day: 0.50    Years: 19.00    Pack years: 9.50    Types: Cigarettes, E-cigarettes    Last attempt to quit: 01/24/2016    Years since quitting: 1.1  . Smokeless tobacco: Never Used  Substance and Sexual Activity  . Alcohol use: Yes    Alcohol/week: 0.0 oz    Comment: occ  . Drug use: No  . Sexual activity: Yes    Birth control/protection: Surgical, IUD  Other Topics Concern  . Not on file  Social History Narrative  . Not on file     No family history on file.     Review of Systems Constitutional: negative for weight loss Genitourinary:negative for abnormal menstrual periods and vaginal discharge   Objective:   BP 115/77   Pulse 89   Wt 161 lb (73 kg)   BMI 25.22 kg/m    General:   alert  Skin:   no rash or abnormalities  Lungs:   clear to auscultation bilaterally  Heart:   regular rate and rhythm, S1, S2 normal, no murmur, click, rub or gallop  Breasts:   deferred  Abdomen:  normal findings: no organomegaly, soft, non-tender and no hernia  Pelvis:  External genitalia: normal general appearance Urinary system: urethral meatus normal and bladder without fullness, nontender Vaginal:  normal without tenderness, induration or masses Cervix: normal appearance, IUD strings present Adnexa: normal bimanual exam Uterus: anteverted and non-tender, normal size   Lab Review Urine pregnancy test Labs reviewed yes Radiologic studies reviewed no  50% of 15 min visit spent on counseling and coordination of care.    Assessment:    37 y.o., continuing IUD, no contraindications.    Plan:    All questions answered.    Need to obtain  previous records Follow up as needed or in 1 year for annual exam.

## 2017-03-11 ENCOUNTER — Telehealth: Payer: Self-pay

## 2017-03-11 NOTE — Telephone Encounter (Signed)
Coordinated delivery of Enbrel from [facility]. Medication was placed in the refrigerator. Please schedule nursing visit for first dose.  Shironda Kain, [place], CPhT 2:11 PM

## 2017-03-11 NOTE — Telephone Encounter (Signed)
Attempted to contact the patient and left message for patient to call the office.  

## 2017-03-16 NOTE — Telephone Encounter (Signed)
Patient has been scheduled for 03/24/17 at 10 am.

## 2017-03-24 ENCOUNTER — Ambulatory Visit: Payer: Medicaid Other

## 2017-04-13 ENCOUNTER — Telehealth: Payer: Self-pay

## 2017-04-13 NOTE — Telephone Encounter (Signed)
Pt called stating that she is having cramping, with vaginal discharge, and breast tenderness. She does have a IUD in place. Pt will schedule an appt to be seen when she comes in today with her child

## 2017-04-15 ENCOUNTER — Ambulatory Visit: Payer: Medicaid Other | Admitting: Certified Nurse Midwife

## 2017-04-15 ENCOUNTER — Other Ambulatory Visit (HOSPITAL_COMMUNITY)
Admission: RE | Admit: 2017-04-15 | Discharge: 2017-04-15 | Disposition: A | Discharge status: Home / Self Care | Payer: Medicaid Other | Source: Ambulatory Visit | Attending: Certified Nurse Midwife | Admitting: Certified Nurse Midwife

## 2017-04-15 ENCOUNTER — Encounter: Payer: Self-pay | Admitting: Certified Nurse Midwife

## 2017-04-15 VITALS — BP 125/82 | HR 71 | Wt 167.8 lb

## 2017-04-15 DIAGNOSIS — Z9851 Tubal ligation status: Secondary | ICD-10-CM

## 2017-04-15 DIAGNOSIS — Z86018 Personal history of other benign neoplasm: Secondary | ICD-10-CM

## 2017-04-15 DIAGNOSIS — N946 Dysmenorrhea, unspecified: Secondary | ICD-10-CM | POA: Diagnosis not present

## 2017-04-15 DIAGNOSIS — N898 Other specified noninflammatory disorders of vagina: Secondary | ICD-10-CM | POA: Diagnosis present

## 2017-04-15 MED ORDER — MELOXICAM 15 MG PO TABS: 15.0000 mg | ORAL_TABLET | Freq: Every day | ORAL | 1 refills | Status: DC

## 2017-04-15 NOTE — Progress Notes (Signed)
Patient ID: Elizabeth Pope, female   DOB: 06-11-1980, 37 y.o.   MRN: [id number]  Chief Complaint  Patient presents with  . Vaginal Discharge    HPI Elizabeth Pope is a 37 y.o. female.  Here for f/u on severe cramping that is now happening all the time, worse with sexual intercourse.  History of small uterine fibroid 07/07/16.  (Small anterior subserosal fibroid is 2.8 cm in diameter.)  History of AUB.  Mirena placed 01/28/17 and has not had bleeding with it.  Currently reports increased cramping.  Is currently sexually active.  Hx of HTN, Bipolar, GERD, TB, Arthritis.  States that she does not sleep at night even with trazodone, and benadryl.    HPI  Past Medical History:  Diagnosis Date  . Anxiety   . Bipolar 1 disorder ([place])   . DDD (degenerative disc disease), cervical 12/25/2015  . Depression   . GERD (gastroesophageal reflux disease) 12/25/2015  . Hypertension 12/25/2015  . Migraine   . Migraines 12/25/2015  . 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/2015   mild  . Osteoarthritis of both hands 12/25/2015  . RA (rheumatoid arthritis) ([place]) 12/25/2015   Positive RF, Elevated ESR, Positive CCP > 250     Past Surgical History:  Procedure Laterality Date  . CESAREAN SECTION    . TUBAL LIGATION      Family History  Problem Relation Age of Onset  . Rheum arthritis Mother   . Migraines Mother   . Hypertension Mother   . Colitis Mother   . Rheum arthritis Maternal Grandmother   . Rheum arthritis Maternal Aunt   . Migraines Sister   . Migraines Brother   . Sickle cell anemia Daughter   . Bipolar disorder Daughter   . Anxiety disorder Daughter   . Depression Daughter   . Migraines Daughter   . Arrhythmia Daughter   . Migraines Son   . ADD / ADHD Son   . Seizures Son   . Arrhythmia Son     Social History Social History   Tobacco Use  . Smoking status: Former Smoker    Packs/day: 0.50    Years: 19.00    Pack years: 9.50    Types: Cigarettes, E-cigarettes     Last attempt to quit: 01/24/2016    Years since quitting: 1.2  . Smokeless tobacco: Never Used  Substance Use Topics  . Alcohol use: Yes    Alcohol/week: 0.0 oz    Comment: occ  . Drug use: No    Allergies  Allergen Reactions  . Fluoxetine     Muscle spasms Other reaction(s): Other (See Comments) Muscle spasms  . Humira [Adalimumab] Rash  . Hydrocodone-Acetaminophen Itching  . Hydroxychloroquine Diarrhea    diarrhea diarrhea  . Other Rash  . Vicodin [Hydrocodone-Acetaminophen] Itching    Current Outpatient Medications  Medication Sig Dispense Refill  . amLODipine (NORVASC) 5 MG tablet Take 1 tablet by mouth daily. Reported on 08/27/2015  5  . cetirizine (ZYRTEC) 10 MG tablet TK 1 T PO D PRN  0  . esomeprazole (NEXIUM) 10 MG packet Take 10 mg by mouth daily before breakfast.    . folic acid (FOLVITE) 1 MG tablet TAKE 2 TABLETS BY MOUTH EVERY DAY 180 tablet 0  . hydrOXYzine (ATARAX/VISTARIL) 25 MG tablet Take 25 mg by mouth 3 (three) times daily as needed.    . lamoTRIgine (LAMICTAL) 100 MG tablet Take 100 mg daily by mouth.    . methotrexate (RHEUMATREX) 2.5 MG tablet  TAKE 8 TABLETS BY MOUTH ONCE A WEEK, CAUTION:CHEMOTHERAPY, PROTECT FROM LIGHT 96 tablet 0  . pyridOXINE (VITAMIN B-6) 50 MG tablet Take 1 tablet (50 mg total) by mouth daily. 60 tablet 0  . RETIN-A 0.025 % cream Apply 1 application daily topically.  2  . sertraline (ZOLOFT) 100 MG tablet Take 100 mg daily by mouth.  1  . triamcinolone cream (KENALOG) 0.1 % Apply 1 application daily topically.    . vitamin B-12 (CYANOCOBALAMIN) 100 MCG tablet Take 100 mcg daily by mouth.    . Adalimumab (HUMIRA PEN) 40 MG/0.8ML PNKT Inject 40 mg into the skin every 14 (fourteen) days. (Patient not taking: Reported on 02/26/2017) 1 each 0  . doxycycline (VIBRA-TABS) 100 MG tablet Take 100 mg 2 (two) times daily by mouth.    . etanercept (ENBREL SURECLICK) 50 MG/ML injection Inject 0.98 mLs (50 mg total) into the skin once a week.  (Patient not taking: Reported on 03/04/2017) 11.76 mL 0  . meloxicam (MOBIC) 15 MG tablet Take 1 tablet (15 mg total) by mouth daily. 30 tablet 1  . predniSONE (DELTASONE) 5 MG tablet 4 tab q am x4days, 3 tab q am x4d, 2 tab q amx4 days, 1 tab q am x4d ,1/2 tab qam x4d then  d/c (Patient not taking: Reported on 02/26/2017) 42 tablet 0  . predniSONE (DELTASONE) 5 MG tablet 4 tab q am x4days, 3 tab q am x4d, 2 tab q am x4 days, 1 tab q am x4d, then stop (Patient not taking: Reported on 04/15/2017) 40 tablet 0  . RESTASIS 0.05 % ophthalmic emulsion 1 drop twice daily  3  . traZODone (DESYREL) 50 MG tablet Take 100 mg by mouth at bedtime.  1   No current facility-administered medications for this visit.     Review of Systems Review of Systems Constitutional: negative for fatigue and weight loss Respiratory: negative for cough and wheezing Cardiovascular: negative for chest pain, fatigue and palpitations Gastrointestinal: negative for abdominal pain and change in bowel habits Genitourinary:negative Integument/breast: negative for nipple discharge Musculoskeletal:negative for myalgias Neurological: negative for gait problems and tremors Behavioral/Psych: negative for abusive relationship, depression Endocrine: negative for temperature intolerance      Blood pressure 125/82, pulse 71, weight 167 lb 12.8 oz (76.1 kg).  Physical Exam Physical Exam General:   alert  Skin:   no rash or abnormalities  Lungs:   clear to auscultation bilaterally  Heart:   regular rate and rhythm, S1, S2 normal, no murmur, click, rub or gallop  Breasts:  deferred  Abdomen:  normal findings: no organomegaly, soft, non-tender and no hernia  Pelvis:  External genitalia: normal general appearance Urinary system: urethral meatus normal and bladder without fullness, nontender Vaginal: normal without tenderness, induration or masses Cervix: Moderate CMT with cervix, IUD strings present Adnexa: normal bimanual exam Uterus:  anteverted and tender, normal size    50% of 20 min visit spent on counseling and coordination of care.   Data Reviewed Previous medical records, [place], labs  Assessment     Severe pelvic pain ?r/t hx of BTL H/O Dysmenorrhea H/O BTL Vaginal discharge    Plan    Orders Placed This Encounter  Procedures  . US PELVIC COMPLETE WITH TRANSVAGINAL    Standing Status:   Future    Standing Expiration Date:   06/14/2018    Order Specific Question:   Reason for Exam (SYMPTOM  OR DIAGNOSIS REQUIRED)    Answer:   AUB hx, dysmenorrhea  Order Specific Question:   Preferred imaging location?    Answer:   [facility]   Meds ordered this encounter  Medications  . meloxicam (MOBIC) 15 MG tablet    Sig: Take 1 tablet (15 mg total) by mouth daily.    Dispense:  30 tablet    Refill:  1     Possible management options include: ?Endometrial ablation, myomectomy if fibroid increased in size.   Follow up after [place] with MD for surgical consult/other options.

## 2017-04-15 NOTE — Progress Notes (Signed)
Pt c/o vaginal discharge with odor no itching. Pt also c/o cramping with IUD and breast pain worsening for past of couple months.

## 2017-04-16 LAB — CERVICOVAGINAL ANCILLARY ONLY
BACTERIAL VAGINITIS: POSITIVE — AB
CANDIDA VAGINITIS: NEGATIVE
Chlamydia: NEGATIVE
Neisseria Gonorrhea: NEGATIVE
Trichomonas: NEGATIVE

## 2017-04-17 ENCOUNTER — Other Ambulatory Visit: Payer: Self-pay | Admitting: Certified Nurse Midwife

## 2017-04-17 DIAGNOSIS — B9689 Other specified bacterial agents as the cause of diseases classified elsewhere: Secondary | ICD-10-CM

## 2017-04-17 DIAGNOSIS — N760 Acute vaginitis: Principal | ICD-10-CM

## 2017-04-17 MED ORDER — SECNIDAZOLE 2 G PO PACK: 1.0000 | PACK | Freq: Once | ORAL | 0 refills | Status: AC

## 2017-04-22 ENCOUNTER — Ambulatory Visit (HOSPITAL_COMMUNITY)
Admission: RE | Admit: 2017-04-22 | Discharge: 2017-04-22 | Disposition: A | Discharge status: Home / Self Care | Payer: Medicaid Other | Source: Ambulatory Visit | Attending: Certified Nurse Midwife | Admitting: Certified Nurse Midwife

## 2017-04-22 DIAGNOSIS — N946 Dysmenorrhea, unspecified: Secondary | ICD-10-CM | POA: Diagnosis present

## 2017-04-22 DIAGNOSIS — D251 Intramural leiomyoma of uterus: Secondary | ICD-10-CM | POA: Insufficient documentation

## 2017-04-22 DIAGNOSIS — Z86018 Personal history of other benign neoplasm: Secondary | ICD-10-CM

## 2017-04-22 DIAGNOSIS — Z975 Presence of (intrauterine) contraceptive device: Secondary | ICD-10-CM | POA: Diagnosis not present

## 2017-04-22 IMAGING — US US PELVIS COMPLETE TRANSABD/TRANSVAG
1 series · 15 of 25 positions shown · non-contrast
Comparison: [DATE]

CLINICAL DATA: Severe dysmenorrhea.  Fibroids.

EXAM:
TRANSABDOMINAL AND TRANSVAGINAL ULTRASOUND OF PELVIS
TECHNIQUE: Both transabdominal and transvaginal ultrasound examinations of the
pelvis were performed. Transabdominal technique was performed for
global imaging of the pelvis including uterus, ovaries, adnexal
regions, and pelvic cul-de-sac. It was necessary to proceed with
endovaginal exam following the transabdominal exam to visualize the
IUD and ovaries.

[Series 1: us pelvis complete transabd/transvag · 48 acquisitions, 15 frames shown]
[im 1/48]
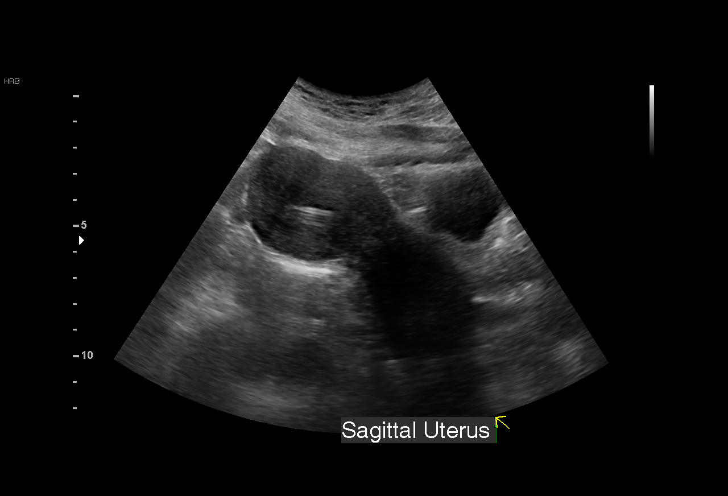
[im 4/48]
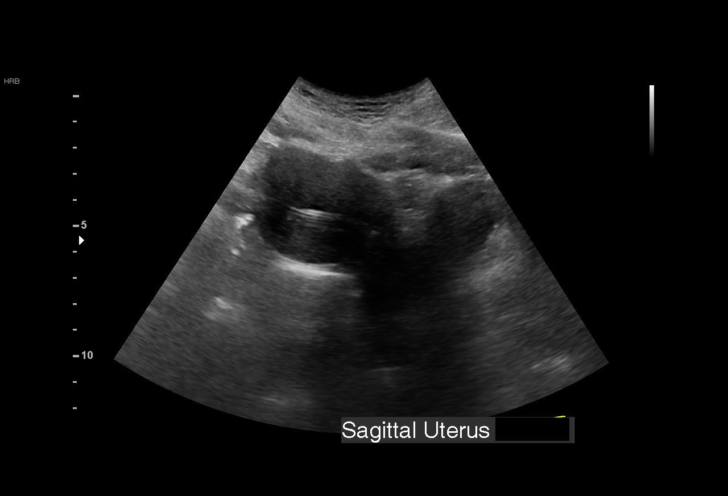
[im 8/48]
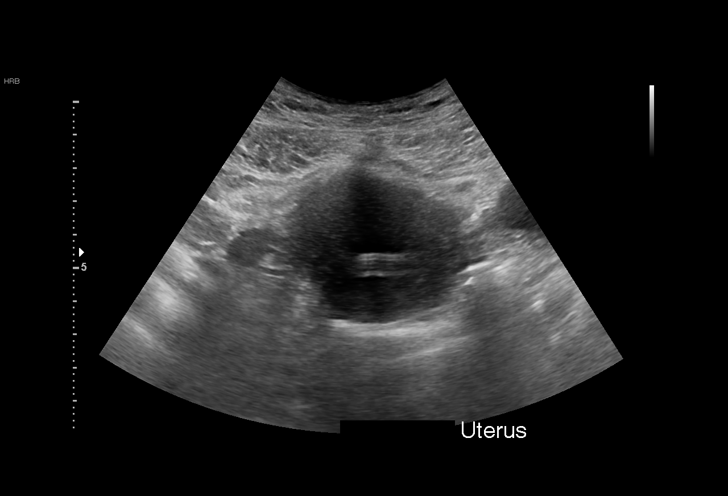
[im 10/48]
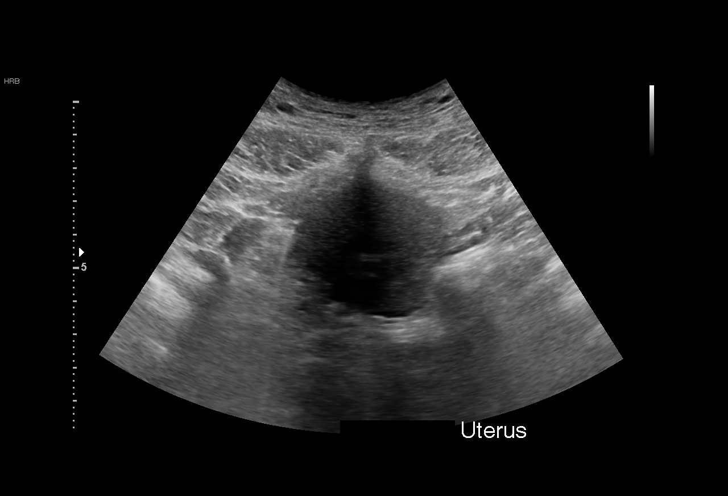
[im 14/48]
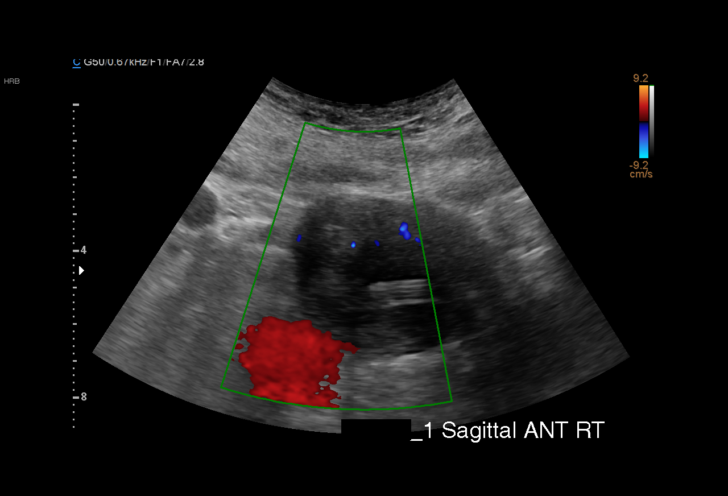
[im 18/48]
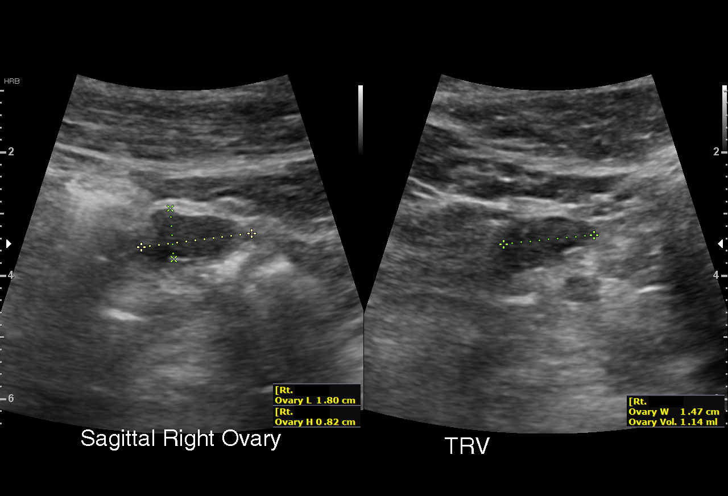
[im 20/48]
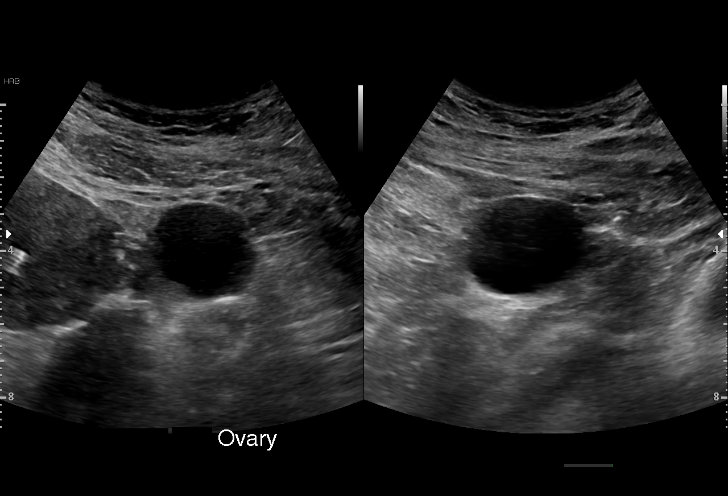
[im 24/48]
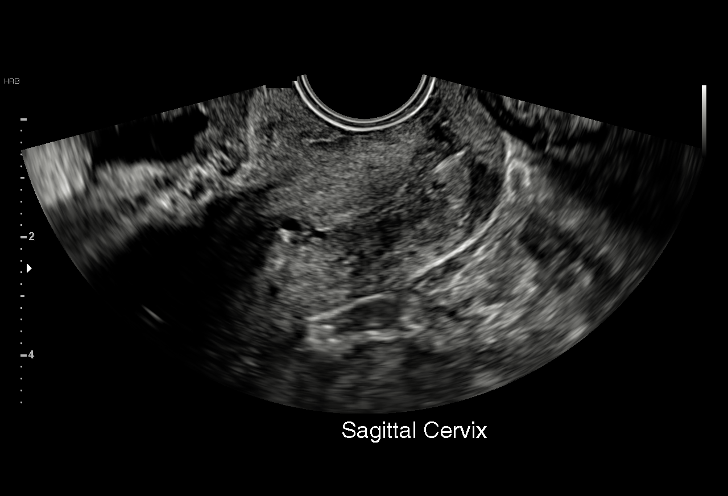
[im 28/48]
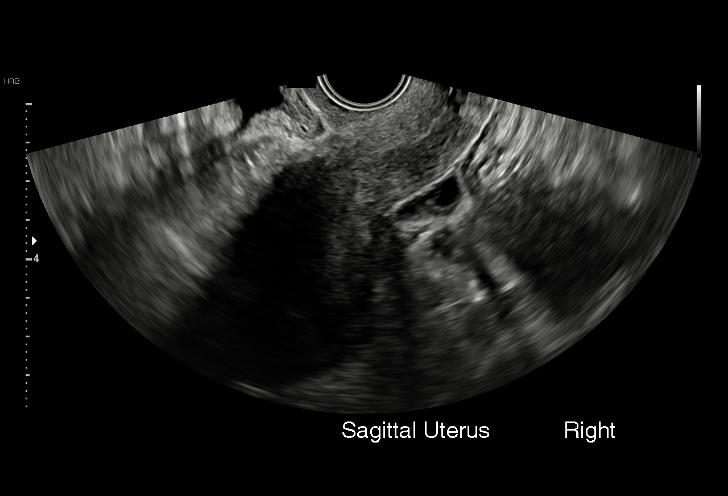
[im 30/48]
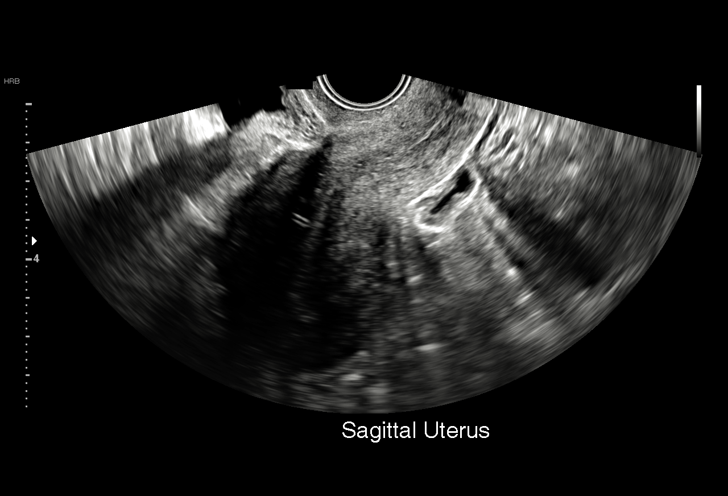
[im 34/48]
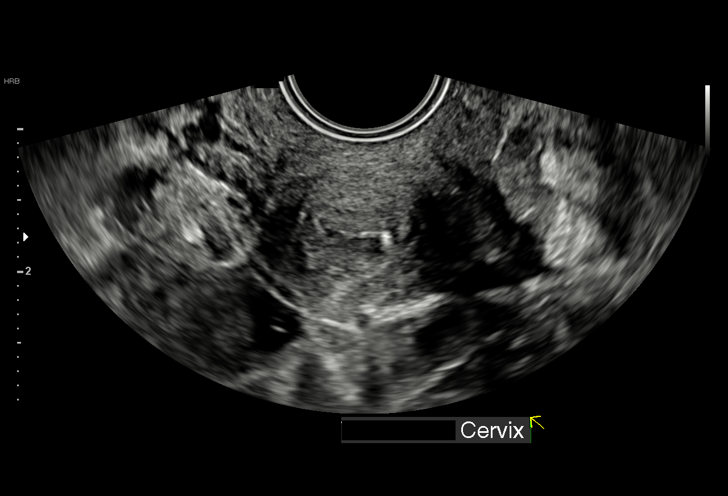
[im 38/48]
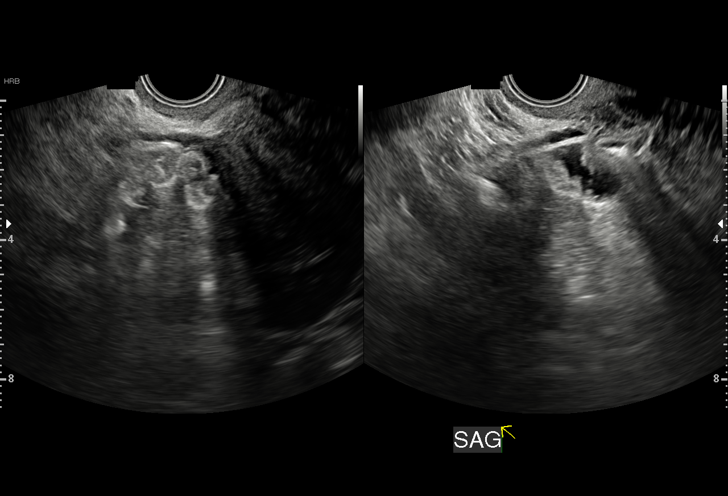
[im 40/48]
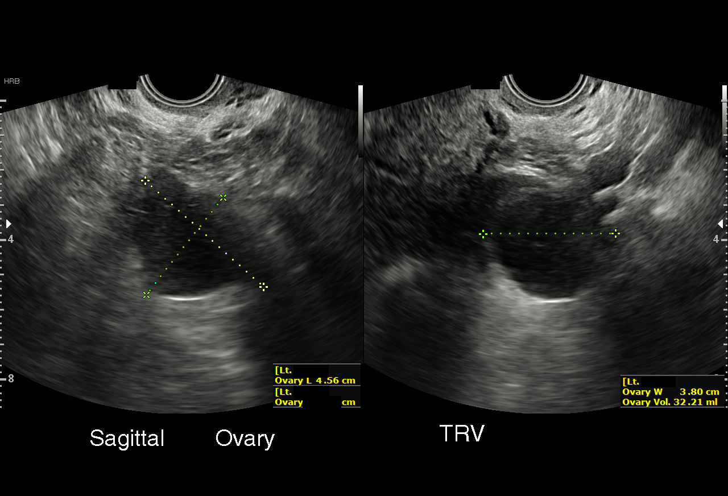
[im 44/48]
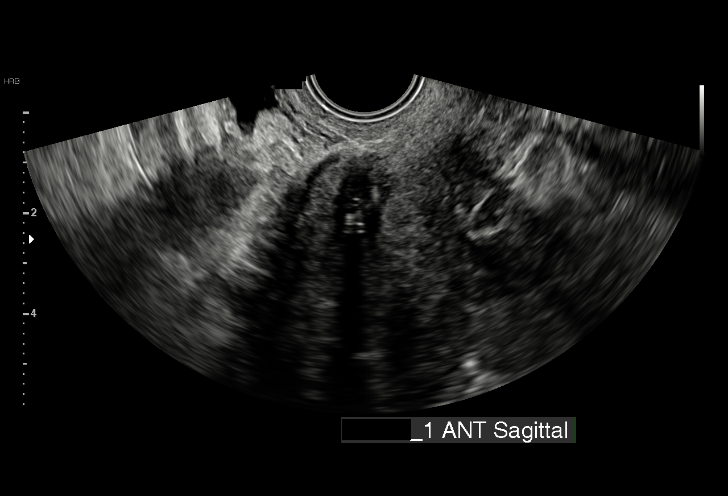
[im 48/48]
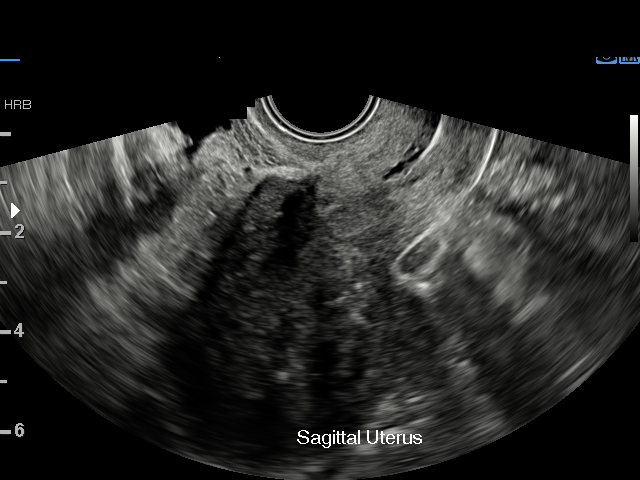

[15 of 25 positions shown; findings below may reference images not displayed]

FINDINGS: Uterus

Measurements: 9.9 x 4.2 X 4.3 cm. Retroflexed. An intramural fibroid
is seen in the anterior corpus which measures 2.3 x 1.3 x 1.9 cm.

Endometrium

Thickness: 5 mm.  IUD visualized within the endometrial cavity.

Right ovary

Measurements: 1.8 x 0.8 x 1.5 cm. Normal appearance/no adnexal mass.

Left ovary

Measurements: 4.7 x 2.9 x 3.3 cm. 3.2 cm simple follicular cyst
noted.

Other findings

No abnormal free fluid.
IMPRESSION: No acute findings.

2.3 cm anterior uterine fibroid, slightly decreased in size compared
to previous study.

IUD visualized within the endometrial cavity.

## 2017-04-28 ENCOUNTER — Ambulatory Visit: Payer: Medicaid Other | Admitting: Obstetrics & Gynecology

## 2017-04-28 ENCOUNTER — Other Ambulatory Visit: Payer: Self-pay | Admitting: Rheumatology

## 2017-04-28 ENCOUNTER — Encounter: Payer: Self-pay | Admitting: Obstetrics & Gynecology

## 2017-04-28 VITALS — BP 139/91 | HR 70 | Wt 171.3 lb

## 2017-04-28 DIAGNOSIS — R102 Pelvic and perineal pain: Secondary | ICD-10-CM

## 2017-04-28 MED ORDER — METRONIDAZOLE 500 MG PO TABS: 500.0000 mg | ORAL_TABLET | Freq: Two times a day (BID) | ORAL | 0 refills | Status: AC

## 2017-04-28 MED ORDER — DOXYCYCLINE HYCLATE 100 MG PO CAPS: 100.0000 mg | ORAL_CAPSULE | Freq: Two times a day (BID) | ORAL | 0 refills | Status: DC

## 2017-04-28 NOTE — Patient Instructions (Signed)

## 2017-04-28 NOTE — Progress Notes (Signed)
Subjective:     Patient ID: Elizabeth Pope, female   DOB: 04-09-1980, 37 y.o.   MRN: [id number] Cc: f/u for pelvic pain [id number] No LMP recorded. Patient is not currently having periods (Reason: IUD). She is 10 years post BTL, with IUD in place for menorrhagia. Only occasional spotting now but more cramping and dyspareunia with the IUD in place. She was unable to take medication prescribed for BV and requests another medication  Allergies  Allergen Reactions  . Fluoxetine     Muscle spasms Other reaction(s): Other (See Comments) Muscle spasms  . Humira [Adalimumab] Rash  . Hydrocodone-Acetaminophen Itching  . Hydroxychloroquine Diarrhea    diarrhea diarrhea  . Other Rash  . Vicodin [Hydrocodone-Acetaminophen] Itching   Current Outpatient Medications on File Prior to Visit  Medication Sig Dispense Refill  . amLODipine (NORVASC) 5 MG tablet Take 1 tablet by mouth daily. Reported on 08/27/2015  5  . cetirizine (ZYRTEC) 10 MG tablet TK 1 T PO D PRN  0  . esomeprazole (NEXIUM) 10 MG packet Take 10 mg by mouth daily before breakfast.    . folic acid (FOLVITE) 1 MG tablet TAKE 2 TABLETS BY MOUTH EVERY DAY 180 tablet 0  . hydrOXYzine (ATARAX/VISTARIL) 25 MG tablet Take 25 mg by mouth 3 (three) times daily as needed.    . lamoTRIgine (LAMICTAL) 100 MG tablet Take 100 mg daily by mouth.    . meloxicam (MOBIC) 15 MG tablet Take 1 tablet (15 mg total) by mouth daily. 30 tablet 1  . methotrexate (RHEUMATREX) 2.5 MG tablet TAKE 8 TABLETS BY MOUTH ONCE A WEEK, CAUTION:CHEMOTHERAPY, PROTECT FROM LIGHT 96 tablet 0  . pyridOXINE (VITAMIN B-6) 50 MG tablet Take 1 tablet (50 mg total) by mouth daily. 60 tablet 0  . RETIN-A 0.025 % cream Apply 1 application daily topically.  2  . sertraline (ZOLOFT) 100 MG tablet Take 100 mg daily by mouth.  1  . triamcinolone cream (KENALOG) 0.1 % Apply 1 application daily topically.    . vitamin B-12 (CYANOCOBALAMIN) 100 MCG tablet Take 100 mcg daily by mouth.     . Adalimumab (HUMIRA PEN) 40 MG/0.8ML PNKT Inject 40 mg into the skin every 14 (fourteen) days. (Patient not taking: Reported on 02/26/2017) 1 each 0  . doxycycline (VIBRA-TABS) 100 MG tablet Take 100 mg 2 (two) times daily by mouth.    . etanercept (ENBREL SURECLICK) 50 MG/ML injection Inject 0.98 mLs (50 mg total) into the skin once a week. (Patient not taking: Reported on 03/04/2017) 11.76 mL 0  . predniSONE (DELTASONE) 5 MG tablet 4 tab q am x4days, 3 tab q am x4d, 2 tab q amx4 days, 1 tab q am x4d ,1/2 tab qam x4d then  d/c (Patient not taking: Reported on 02/26/2017) 42 tablet 0  . predniSONE (DELTASONE) 5 MG tablet 4 tab q am x4days, 3 tab q am x4d, 2 tab q am x4 days, 1 tab q am x4d, then stop (Patient not taking: Reported on 04/15/2017) 40 tablet 0  . RESTASIS 0.05 % ophthalmic emulsion 1 drop twice daily  3  . traZODone (DESYREL) 50 MG tablet Take 100 mg by mouth at bedtime.  1   No current facility-administered medications on file prior to visit.   ' Review of Systems  Constitutional: Negative.   Gastrointestinal: Negative.   Genitourinary: Positive for dyspareunia, pelvic pain and vaginal discharge. Negative for vaginal bleeding.       Objective:   Physical Exam  Constitutional:  She is oriented to person, place, and time. She appears well-developed. No distress.  Cardiovascular: Normal rate.  Neurological: She is alert and oriented to person, place, and time.  Psychiatric: She has a normal mood and affect. Her behavior is normal.  CLINICAL DATA:  Severe dysmenorrhea.  Fibroids.  EXAM: TRANSABDOMINAL AND TRANSVAGINAL ULTRASOUND OF PELVIS  TECHNIQUE: Both transabdominal and transvaginal ultrasound examinations of the pelvis were performed. Transabdominal technique was performed for global imaging of the pelvis including uterus, ovaries, adnexal regions, and pelvic cul-de-sac. It was necessary to proceed with endovaginal exam following the transabdominal exam to visualize  the IUD and ovaries.  COMPARISON:  07/07/2016  FINDINGS: Uterus  Measurements: 9.9 x 4.2 X 4.3 cm. Retroflexed. An intramural fibroid is seen in the anterior corpus which measures 2.3 x 1.3 x 1.9 cm.  Endometrium  Thickness: 5 mm.  IUD visualized within the endometrial cavity.  Right ovary  Measurements: 1.8 x 0.8 x 1.5 cm. Normal appearance/no adnexal mass.  Left ovary  Measurements: 4.7 x 2.9 x 3.3 cm. 3.2 cm simple follicular cyst noted.  Other findings  No abnormal free fluid.  IMPRESSION: No acute findings.  2.3 cm anterior uterine fibroid, slightly decreased in size compared to previous study.  IUD visualized within the endometrial cavity.   Electronically Signed   By: Earle Gell M.D.    Assessment:     Pelvic pain with Mirena in place Small uterine fibroid S/p BTL    Plan:     Flagyl for BV and add doxycycline for possible endometritis. RTC 4 weeks to review sx and repeat examination  Woodroe Mode, MD 04/28/2017

## 2017-04-28 NOTE — Progress Notes (Signed)
Patient is in the office for surgical consult. Pt states that she could not take solosec because it mad her feel like she was going to vomit, pt requests Flagyl, notified provider.

## 2017-04-29 NOTE — Telephone Encounter (Signed)
Last Visit: 02/25/17 Next Visit: 05/21/17  Okay to refill per Dr. Estanislado Pandy

## 2017-04-30 ENCOUNTER — Telehealth: Payer: Self-pay | Admitting: *Deleted

## 2017-04-30 NOTE — Telephone Encounter (Signed)
Spoke with patient and rescheduled her for new start to Enbrel on 05/12/17 at 10 am.

## 2017-05-07 NOTE — Progress Notes (Signed)
Office Visit Note  Patient: Elizabeth Pope             Date of Birth: 1980/06/07           MRN: [id number]             PCP: Kristie Cowman, MD Referring: Kristie Cowman, MD Visit Date: 05/21/2017 Occupation: @GUAROCC @    Subjective:  Medication Management   History of Present Illness: Elizabeth Pope is a 37 y.o. female with history of seropositive rheumatoid arthritis.  She had a reaction to Humira with rash on her face.  She was started on Enbrel about a week ago.  She states she is still having pain and discomfort in her joints which she describes in her wrist and ankles.  She continues to have some lower back pain.  Activities of Daily Living:  Patient reports morning stiffness for 2 hours.   Patient Reports nocturnal pain.  Difficulty dressing/grooming: Reports Difficulty climbing stairs: Reports Difficulty getting out of chair: Denies Difficulty using hands for taps, buttons, cutlery, and/or writing: Reports   Review of Systems  Constitutional: Positive for fatigue. Negative for night sweats, weight gain and weight loss.  HENT: Positive for mouth sores and mouth dryness. Negative for trouble swallowing, trouble swallowing and nose dryness.   Eyes: Positive for dryness. Negative for pain, redness and visual disturbance.  Respiratory: Negative for cough, shortness of breath and difficulty breathing.   Cardiovascular: Negative for chest pain, palpitations, hypertension, irregular heartbeat and swelling in legs/feet.  Gastrointestinal: Negative for abdominal pain, blood in stool, constipation and diarrhea.  Endocrine: Negative for increased urination.  Genitourinary: Negative for pelvic pain and vaginal dryness.  Musculoskeletal: Positive for arthralgias, joint pain, joint swelling and morning stiffness. Negative for myalgias, muscle weakness, muscle tenderness and myalgias.  Skin: Negative for color change, rash, hair loss, redness, skin tightness, ulcers and  sensitivity to sunlight.  Allergic/Immunologic: Negative for susceptible to infections.  Neurological: Negative for dizziness, headaches, memory loss, night sweats and weakness.  Hematological: Negative for bruising/bleeding tendency and swollen glands.  Psychiatric/Behavioral: Negative for depressed mood, confusion and sleep disturbance. The patient is not nervous/anxious.     PMFS History:  Patient Active Problem List   Diagnosis Date Noted  . Atypical squamous cell changes of undetermined significance (ASCUS) on cervical cytology with negative high risk human papilloma virus (HPV) test result 02/03/2017  . Encounter for IUD insertion 01/28/2017  . Uterine fibroid 01/21/2017  . H/O tubal ligation 06/24/2016  . High risk medication use 01/07/2016  . GERD (gastroesophageal reflux disease) 12/25/2015  . Migraines 12/25/2015  . Hypertension 12/25/2015  . RA (rheumatoid arthritis) ([place]) 12/25/2015  . DDD (degenerative disc disease), cervical 12/25/2015  . Osteoarthritis of both hands 12/25/2015  . 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/2015  . 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 Bipolar 2 disorder ([place]) 05/14/2015  . Cigarette smoker 05/05/2015  . Chest pain 05/05/2015  . Solitary pulmonary nodule 05/04/2015    Past Medical History:  Diagnosis Date  . Anxiety   . Bipolar 1 disorder ([place])   . DDD (degenerative disc disease), cervical 12/25/2015  . Depression   . GERD (gastroesophageal reflux disease) 12/25/2015  . Hypertension 12/25/2015  . Migraine   . Migraines 12/25/2015  . 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/2015   mild  . Osteoarthritis of both hands 12/25/2015  . RA (rheumatoid arthritis) ([facility]) 12/25/2015   Positive RF, Elevated ESR, Positive CCP > 250  Family History  Problem Relation Age of Onset  . Rheum arthritis Mother   . Migraines Mother   . Hypertension Mother   . Colitis Mother   . Rheum arthritis Maternal Grandmother     . Rheum arthritis Maternal Aunt   . Migraines Sister   . Migraines Brother   . Sickle cell anemia Daughter   . Bipolar disorder Daughter   . Anxiety disorder Daughter   . Depression Daughter   . Migraines Daughter   . Arrhythmia Daughter   . Migraines Son   . ADD / ADHD Son   . Seizures Son   . Arrhythmia Son    Past Surgical History:  Procedure Laterality Date  . CESAREAN SECTION    . TUBAL LIGATION     Social History   Social History Narrative  . Not on file     Objective: Vital Signs: BP (!) 130/97 (BP Location: Left Arm, Patient Position: Sitting, Cuff Size: Normal)   Pulse 85   Resp 16   Ht 5' 3"  (1.6 m)   Wt 172 lb (78 kg)   BMI 30.47 kg/m    Physical Exam  Constitutional: She is oriented to person, place, and time. She appears well-developed and well-nourished.  HENT:  Head: Normocephalic and atraumatic.  Eyes: Conjunctivae and EOM are normal.  Neck: Normal range of motion.  Cardiovascular: Normal rate, regular rhythm, normal heart sounds and intact distal pulses.  Pulmonary/Chest: Effort normal and breath sounds normal.  Abdominal: Soft. Bowel sounds are normal.  Lymphadenopathy:    She has no cervical adenopathy.  Neurological: She is alert and oriented to person, place, and time.  Skin: Skin is warm and dry. Capillary refill takes less than 2 seconds.  Psychiatric: She has a normal mood and affect. Her behavior is normal.  Nursing note and vitals reviewed.    Musculoskeletal Exam: C-spine thoracic lumbar spine good range of motion.  She has discomfort range of motion of her C-spine and lumbar spine.  Discomfort with range of motion of her bilateral shoulders.  Elbow joints wrist joints MCPs PIPs DIPs are good range of motion with no synovitis.  Hip joints knee joints ankles MTPs PIPs were in good range of motion with no synovitis.  CDAI Exam: CDAI Homunculus Exam:   Tenderness:  LUE: wrist  Joint Counts:  CDAI Tender Joint count: 1 CDAI  Swollen Joint count: 0  Global Assessments:  Patient Global Assessment: 4 Provider Global Assessment: 4  CDAI Calculated Score: 9    Investigation: No additional findings.  September 09, 2016 TB Gold negative CBC Latest Ref Rng & Units 12/30/2016 09/09/2016 06/24/2016  WBC 3.8 - 10.8 Thousand/uL 6.8 7.3 5.9  Hemoglobin 11.7 - 15.5 g/dL 12.3 12.6 11.8  Hematocrit 35.0 - 45.0 % 35.4 37.5 34.1  Platelets 140 - 400 Thousand/uL 325 373 342   CMP Latest Ref Rng & Units 12/30/2016 09/09/2016 06/24/2016  Glucose 65 - 99 mg/dL 78 65 90  BUN 7 - 25 mg/dL 16 10 14   Creatinine 0.50 - 1.10 mg/dL 1.03 0.95 0.98  Sodium 135 - 146 mmol/L 138 136 139  Potassium 3.5 - 5.3 mmol/L 4.3 4.8 4.1  Chloride 98 - 110 mmol/L 107 104 100  CO2 20 - 32 mmol/L 24 23 24   Calcium 8.6 - 10.2 mg/dL 9.0 9.5 9.4  Total Protein 6.1 - 8.1 g/dL 7.3 7.5 7.3  Total Bilirubin 0.2 - 1.2 mg/dL 0.3 0.3 0.2  Alkaline Phos 33 - 115 U/L - 49  41  AST 10 - 30 U/L 23 22 16   ALT 6 - 29 U/L 27 20 17     Imaging: US Pelvic Complete With Transvaginal  Result Date: 04/22/2017 CLINICAL DATA:  Severe dysmenorrhea.  Fibroids. EXAM: TRANSABDOMINAL AND TRANSVAGINAL ULTRASOUND OF PELVIS TECHNIQUE: Both transabdominal and transvaginal ultrasound examinations of the pelvis were performed. Transabdominal technique was performed for global imaging of the pelvis including uterus, ovaries, adnexal regions, and pelvic cul-de-sac. It was necessary to proceed with endovaginal exam following the transabdominal exam to visualize the IUD and ovaries. COMPARISON:  07/07/2016 FINDINGS: Uterus Measurements: 9.9 x 4.2 X 4.3 cm. Retroflexed. An intramural fibroid is seen in the anterior corpus which measures 2.3 x 1.3 x 1.9 cm. Endometrium Thickness: 5 mm.  IUD visualized within the endometrial cavity. Right ovary Measurements: 1.8 x 0.8 x 1.5 cm. Normal appearance/no adnexal mass. Left ovary Measurements: 4.7 x 2.9 x 3.3 cm. 3.2 cm simple follicular cyst noted. Other  findings No abnormal free fluid. IMPRESSION: No acute findings. 2.3 cm anterior uterine fibroid, slightly decreased in size compared to previous study. IUD visualized within the endometrial cavity. Electronically Signed   By: Earle Gell M.D.   On: 04/22/2017 14:11    Speciality Comments: PLQ Eye Exam: WNL 03/24/17 @ [facility]    Procedures:  No procedures performed Allergies: Fluoxetine; Humira [adalimumab]; Hydrocodone-acetaminophen; Hydroxychloroquine; Other; and Vicodin [hydrocodone-acetaminophen]   Assessment / Plan:     Visit Diagnoses: Rheumatoid arthritis involving multiple sites with positive rheumatoid factor ([place]): She complains of some arthralgias but had no synovitis on examination.  She started Enbrel last week.  She is on combination therapy with Enbrel and methotrexate now.  High risk medication use - Enbrel 30 mg subcu weekly started on May 12, 2017, MTX 8 tablets po q wk, folic acid 2 mg (Humira caused facial rash)TB gold: 7/17/18CBC/CMP 12/30/16 - Plan: CBC with Differential/Platelet, COMPLETE METABOLIC PANEL WITH GFR today and then every 3 months to monitor for drug toxicity.  Primary osteoarthritis of both hands: She continues to have some discomfort.  Primary osteoarthritis of both feet: Proper fitting shoes were discussed.  DDD (degenerative disc disease), cervical: Doing well.  Chronic midline low back pain without sciatica: She has been complaining of a lot of lower back pain.  She had good range of motion.  Have given her a handout on back exercises.  Chondromalacia of both patellae: Chronic pain  Other medical problems are listed as follows:  Solitary pulmonary nodule  Cigarette smoker: Smoking cessation discussed.  Bipolar 2 disorder ([place])  TB lung, latent - Treated with INH 2017  Essential hypertension: Her blood pressure is elevated she was advised to monitor blood pressure and follow-up with PCP. History of migraine  History of  gastroesophageal reflux (GERD)    Orders: Orders Placed This Encounter  Procedures  . CBC with Differential/Platelet  . COMPLETE METABOLIC PANEL WITH GFR   No orders of the defined types were placed in this encounter.   Face-to-face time spent with patient was 30 minutes.  Greater than 50% of time was spent in counseling and coordination of care.  Follow-Up Instructions: Return in about 5 months (around 10/21/2017) for Rheumatoid arthritis.   Bo Merino, MD  Note - This record has been created using Editor, commissioning.  Chart creation errors have been sought, but may not always  have been located. Such creation errors do not reflect on  the standard of medical care.

## 2017-05-12 ENCOUNTER — Ambulatory Visit: Payer: Medicaid Other | Admitting: *Deleted

## 2017-05-12 VITALS — BP 146/103 | HR 79

## 2017-05-12 DIAGNOSIS — M0579 Rheumatoid arthritis with rheumatoid factor of multiple sites without organ or systems involvement: Secondary | ICD-10-CM | POA: Diagnosis not present

## 2017-05-12 MED ORDER — ETANERCEPT 50 MG/ML ~~LOC~~ SOAJ
50.0000 mg | Freq: Once | SUBCUTANEOUS | Status: AC
  Administered 2017-05-12: 50 mg via SUBCUTANEOUS

## 2017-05-12 NOTE — Progress Notes (Signed)
Patient in office for a new start to Enbrel. Patient provided medication. Patient was given a demonstration of proper technique to self administer the medication. Patient was able to show proper technique. Patient was given injection in left thigh and tolerated injection well. Patient was monitored in office for 30 minutes after administration for adverse reactions. No adverse reactions noted.  Administrations This Visit    etanercept (ENBREL) 50 MG/ML injection 50 mg    Admin Date 05/12/2017 Action Given Dose 50 mg Route Subcutaneous Administered By Carole Binning, LPN

## 2017-05-12 NOTE — Patient Instructions (Signed)
Standing Labs We placed an order today for your standing lab work.    Please come back and get your standing labs in 1 month and every 3 months  We have open lab Monday through Friday from 8:30-11:30 AM and 1:30-4:00 PM  at the office of Dr. Shaili Deveshwar.   You may experience shorter wait times on Monday and Friday afternoons. The office is located at [address], [address], [place], [place] [address] No appointment is necessary.   Labs are drawn by [facility].  You may receive a bill from [facility] for your lab work. If you have any questions regarding directions or hours of operation,  please call [phone].     

## 2017-05-19 ENCOUNTER — Telehealth: Payer: Self-pay

## 2017-05-19 NOTE — Telephone Encounter (Signed)
Patient is requesting Rx for vaginal itching, Monistat did not work.

## 2017-05-21 ENCOUNTER — Encounter: Payer: Self-pay | Admitting: Physician Assistant

## 2017-05-21 ENCOUNTER — Ambulatory Visit: Payer: Medicaid Other | Admitting: Physician Assistant

## 2017-05-21 VITALS — BP 130/97 | HR 85 | Resp 16 | Ht 63.0 in | Wt 172.0 lb

## 2017-05-21 DIAGNOSIS — M19041 Primary osteoarthritis, right hand: Secondary | ICD-10-CM | POA: Diagnosis not present

## 2017-05-21 DIAGNOSIS — M5030 Other cervical disc degeneration, unspecified cervical region: Secondary | ICD-10-CM

## 2017-05-21 DIAGNOSIS — Z227 Latent tuberculosis: Secondary | ICD-10-CM

## 2017-05-21 DIAGNOSIS — M2241 Chondromalacia patellae, right knee: Secondary | ICD-10-CM | POA: Diagnosis not present

## 2017-05-21 DIAGNOSIS — M545 Low back pain, unspecified: Secondary | ICD-10-CM

## 2017-05-21 DIAGNOSIS — Z79899 Other long term (current) drug therapy: Secondary | ICD-10-CM | POA: Diagnosis not present

## 2017-05-21 DIAGNOSIS — F17210 Nicotine dependence, cigarettes, uncomplicated: Secondary | ICD-10-CM

## 2017-05-21 DIAGNOSIS — M0579 Rheumatoid arthritis with rheumatoid factor of multiple sites without organ or systems involvement: Secondary | ICD-10-CM

## 2017-05-21 DIAGNOSIS — I10 Essential (primary) hypertension: Secondary | ICD-10-CM

## 2017-05-21 DIAGNOSIS — R911 Solitary pulmonary nodule: Secondary | ICD-10-CM

## 2017-05-21 DIAGNOSIS — R7611 Nonspecific reaction to tuberculin skin test without active tuberculosis: Secondary | ICD-10-CM | POA: Diagnosis not present

## 2017-05-21 DIAGNOSIS — M19042 Primary osteoarthritis, left hand: Secondary | ICD-10-CM

## 2017-05-21 DIAGNOSIS — Z8669 Personal history of other diseases of the nervous system and sense organs: Secondary | ICD-10-CM

## 2017-05-21 DIAGNOSIS — M2242 Chondromalacia patellae, left knee: Secondary | ICD-10-CM

## 2017-05-21 DIAGNOSIS — Z8719 Personal history of other diseases of the digestive system: Secondary | ICD-10-CM

## 2017-05-21 DIAGNOSIS — G8929 Other chronic pain: Secondary | ICD-10-CM

## 2017-05-21 DIAGNOSIS — M19072 Primary osteoarthritis, left ankle and foot: Secondary | ICD-10-CM

## 2017-05-21 DIAGNOSIS — F3181 Bipolar II disorder: Secondary | ICD-10-CM | POA: Diagnosis not present

## 2017-05-21 DIAGNOSIS — M19071 Primary osteoarthritis, right ankle and foot: Secondary | ICD-10-CM | POA: Diagnosis not present

## 2017-05-21 LAB — COMPLETE METABOLIC PANEL WITH GFR
AG RATIO: 1.4 (calc) (ref 1.0–2.5)
ALT: 40 U/L — ABNORMAL HIGH (ref 6–29)
AST: 27 U/L (ref 10–30)
Albumin: 4.1 g/dL (ref 3.6–5.1)
Alkaline phosphatase (APISO): 47 U/L (ref 33–115)
BUN: 14 mg/dL (ref 7–25)
CALCIUM: 9.5 mg/dL (ref 8.6–10.2)
CO2: 27 mmol/L (ref 20–32)
Chloride: 108 mmol/L (ref 98–110)
Creat: 0.91 mg/dL (ref 0.50–1.10)
GFR, EST AFRICAN AMERICAN: 93 mL/min/{1.73_m2} (ref >=60)
GFR, EST NON AFRICAN AMERICAN: 81 mL/min/{1.73_m2} (ref >=60)
GLOBULIN: 3 g/dL (ref 1.9–3.7)
Glucose, Bld: 104 mg/dL — ABNORMAL HIGH (ref 65–99)
POTASSIUM: 4.7 mmol/L (ref 3.5–5.3)
SODIUM: 140 mmol/L (ref 135–146)
TOTAL PROTEIN: 7.1 g/dL (ref 6.1–8.1)
Total Bilirubin: 0.3 mg/dL (ref 0.2–1.2)

## 2017-05-21 LAB — CBC WITH DIFFERENTIAL/PLATELET
Basophils Absolute: 43 cells/uL (ref 0–200)
Basophils Relative: 0.7 %
Eosinophils Absolute: 397 cells/uL (ref 15–500)
Eosinophils Relative: 6.5 %
HEMATOCRIT: 38.3 % (ref 35.0–45.0)
Hemoglobin: 13 g/dL (ref 11.7–15.5)
LYMPHS ABS: 2715 {cells}/uL (ref 850–3900)
MCH: 28.8 pg (ref 27.0–33.0)
MCHC: 33.9 g/dL (ref 32.0–36.0)
MCV: 84.9 fL (ref 80.0–100.0)
MPV: 9.8 fL (ref 7.5–12.5)
Monocytes Relative: 5.5 %
NEUTROS PCT: 42.8 %
Neutro Abs: 2611 cells/uL (ref 1500–7800)
Platelets: 334 10*3/uL (ref 140–400)
RBC: 4.51 10*6/uL (ref 3.80–5.10)
RDW: 16 % — AB (ref 11.0–15.0)
Total Lymphocyte: 44.5 %
WBC: 6.1 10*3/uL (ref 3.8–10.8)
WBCMIX: 336 {cells}/uL (ref 200–950)

## 2017-05-21 NOTE — Patient Instructions (Addendum)
Back Exercises The following exercises strengthen the muscles that help to support the back. They also help to keep the lower back flexible. Doing these exercises can help to prevent back pain or lessen existing pain. If you have back pain or discomfort, try doing these exercises 2-3 times each day or as told by your health care provider. When the pain goes away, do them once each day, but increase the number of times that you repeat the steps for each exercise (do more repetitions). If you do not have back pain or discomfort, do these exercises once each day or as told by your health care provider. Exercises Single Knee to Chest  Repeat these steps 3-5 times for each leg: 1. Lie on your back on a firm bed or the floor with your legs extended. 2. Bring one knee to your chest. Your other leg should stay extended and in contact with the floor. 3. Hold your knee in place by grabbing your knee or thigh. 4. Pull on your knee until you feel a gentle stretch in your lower back. 5. Hold the stretch for 10-30 seconds. 6. Slowly release and straighten your leg.  Pelvic Tilt  Repeat these steps 5-10 times: 1. Lie on your back on a firm bed or the floor with your legs extended. 2. Bend your knees so they are pointing toward the ceiling and your feet are flat on the floor. 3. Tighten your lower abdominal muscles to press your lower back against the floor. This motion will tilt your pelvis so your tailbone points up toward the ceiling instead of pointing to your feet or the floor. 4. With gentle tension and even breathing, hold this position for 5-10 seconds.  Cat-Cow  Repeat these steps until your lower back becomes more flexible: 1. Get into a hands-and-knees position on a firm surface. Keep your hands under your shoulders, and keep your knees under your hips. You may place padding under your knees for comfort. 2. Let your head hang down, and point your tailbone toward the floor so your lower back  becomes rounded like the back of a cat. 3. Hold this position for 5 seconds. 4. Slowly lift your head and point your tailbone up toward the ceiling so your back forms a sagging arch like the back of a cow. 5. Hold this position for 5 seconds.  Press-Ups  Repeat these steps 5-10 times: 1. Lie on your abdomen (face-down) on the floor. 2. Place your palms near your head, about shoulder-width apart. 3. While you keep your back as relaxed as possible and keep your hips on the floor, slowly straighten your arms to raise the top half of your body and lift your shoulders. Do not use your back muscles to raise your upper torso. You may adjust the placement of your hands to make yourself more comfortable. 4. Hold this position for 5 seconds while you keep your back relaxed. 5. Slowly return to lying flat on the floor.  Bridges  Repeat these steps 10 times: 1. Lie on your back on a firm surface. 2. Bend your knees so they are pointing toward the ceiling and your feet are flat on the floor. 3. Tighten your buttocks muscles and lift your buttocks off of the floor until your waist is at almost the same height as your knees. You should feel the muscles working in your buttocks and the back of your thighs. If you do not feel these muscles, slide your feet 1-2 inches farther away   from your buttocks. 4. Hold this position for 3-5 seconds. 5. Slowly lower your hips to the starting position, and allow your buttocks muscles to relax completely.  If this exercise is too easy, try doing it with your arms crossed over your chest. Abdominal Crunches  Repeat these steps 5-10 times: 1. Lie on your back on a firm bed or the floor with your legs extended. 2. Bend your knees so they are pointing toward the ceiling and your feet are flat on the floor. 3. Cross your arms over your chest. 4. Tip your chin slightly toward your chest without bending your neck. 5. Tighten your abdominal muscles and slowly raise your  trunk (torso) high enough to lift your shoulder blades a tiny bit off of the floor. Avoid raising your torso higher than that, because it can put too much stress on your low back and it does not help to strengthen your abdominal muscles. 6. Slowly return to your starting position.  Back Lifts Repeat these steps 5-10 times: 1. Lie on your abdomen (face-down) with your arms at your sides, and rest your forehead on the floor. 2. Tighten the muscles in your legs and your buttocks. 3. Slowly lift your chest off of the floor while you keep your hips pressed to the floor. Keep the back of your head in line with the curve in your back. Your eyes should be looking at the floor. 4. Hold this position for 3-5 seconds. 5. Slowly return to your starting position.  Contact a health care provider if:  Your back pain or discomfort gets much worse when you do an exercise.  Your back pain or discomfort does not lessen within 2 hours after you exercise. If you have any of these problems, stop doing these exercises right away. Do not do them again unless your health care provider says that you can. Get help right away if:  You develop sudden, severe back pain. If this happens, stop doing the exercises right away. Do not do them again unless your health care provider says that you can. This information is not intended to replace advice given to you by your health care provider. Make sure you discuss any questions you have with your health care provider. Document Released: 03/20/2004 Document Revised: 06/20/2015 Document Reviewed: 04/06/2014 Elsevier Interactive Patient Education  2017 [place] We placed an order today for your standing lab work.    Please come back and get your standing labs in 2 months then every 3 months  We have open lab Monday through Friday from 8:30-11:30 AM and 1:30-4:00 PM  at the office of Dr. Bo Merino.   You may experience shorter wait times on Monday  and Friday afternoons. The office is located at [address], [place], [place], [place] [address] No appointment is necessary.   Labs are drawn by [facility].  You may receive a bill from [facility] for your lab work. If you have any questions regarding directions or hours of operation,  please call [phone].

## 2017-05-22 NOTE — Progress Notes (Signed)
LFTs mildly elevated. She should avoid Mobic.

## 2017-05-27 ENCOUNTER — Ambulatory Visit: Payer: Medicaid Other | Admitting: Obstetrics & Gynecology

## 2017-06-02 ENCOUNTER — Encounter: Payer: Self-pay | Admitting: Obstetrics & Gynecology

## 2017-06-02 ENCOUNTER — Ambulatory Visit (INDEPENDENT_AMBULATORY_CARE_PROVIDER_SITE_OTHER): Payer: Medicaid Other | Admitting: Obstetrics & Gynecology

## 2017-06-02 DIAGNOSIS — R102 Pelvic and perineal pain: Secondary | ICD-10-CM | POA: Diagnosis not present

## 2017-06-02 NOTE — Progress Notes (Signed)
Patient ID: [name], female   DOB: [birth year], 37 y.o.   MRN: [id number]  Chief Complaint  Patient presents with  . Follow-up    HPI Jamilya Sarrazin is a 37 y.o. female.  [id number] No LMP recorded. (Menstrual status: IUD). S/p BTL, has IUD in place. Pain is better but still present.  HPI  Past Medical History:  Diagnosis Date  . Anxiety   . Bipolar 1 disorder ([place])   . DDD (degenerative disc disease), cervical 12/25/2015  . Depression   . GERD (gastroesophageal reflux disease) 12/25/2015  . Hypertension 12/25/2015  . Migraine   . Migraines 12/25/2015  . 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/2015   mild  . Osteoarthritis of both hands 12/25/2015  . RA (rheumatoid arthritis) ([place]) 12/25/2015   Positive RF, Elevated ESR, Positive CCP > 250     Past Surgical History:  Procedure Laterality Date  . CESAREAN SECTION    . TUBAL LIGATION      Family History  Problem Relation Age of Onset  . Rheum arthritis Mother   . Migraines Mother   . Hypertension Mother   . Colitis Mother   . Rheum arthritis Maternal Grandmother   . Rheum arthritis Maternal Aunt   . Migraines Sister   . Migraines Brother   . Sickle cell anemia Daughter   . Bipolar disorder Daughter   . Anxiety disorder Daughter   . Depression Daughter   . Migraines Daughter   . Arrhythmia Daughter   . Migraines Son   . ADD / ADHD Son   . Seizures Son   . Arrhythmia Son     Social History Social History   Tobacco Use  . Smoking status: Former Smoker    Packs/day: 0.50    Years: 19.00    Pack years: 9.50    Types: Cigarettes, E-cigarettes    Last attempt to quit: 01/24/2016    Years since quitting: 1.3  . Smokeless tobacco: Never Used  Substance Use Topics  . Alcohol use: Yes    Alcohol/week: 0.0 oz    Comment: occ  . Drug use: No    Allergies  Allergen Reactions  . Fluoxetine     Muscle spasms Other reaction(s): Other (See Comments) Muscle spasms  . Humira [Adalimumab] Rash   . Hydrocodone-Acetaminophen Itching  . Hydroxychloroquine Diarrhea    diarrhea diarrhea  . Other Rash  . Vicodin [Hydrocodone-Acetaminophen] Itching    Current Outpatient Medications  Medication Sig Dispense Refill  . amLODipine (NORVASC) 5 MG tablet Take 1 tablet by mouth daily. Reported on 08/27/2015  5  . cetirizine (ZYRTEC) 10 MG tablet TK 1 T PO D PRN  0  . doxycycline (VIBRA-TABS) 100 MG tablet Take 100 mg 2 (two) times daily by mouth.    . doxycycline (VIBRAMYCIN) 100 MG capsule Take 1 capsule (100 mg total) by mouth 2 (two) times daily. 14 capsule 0  . esomeprazole (NEXIUM) 10 MG packet Take 10 mg by mouth daily before breakfast.    . etanercept (ENBREL SURECLICK) 50 MG/ML injection Inject 0.98 mLs (50 mg total) into the skin once a week. 23.55 mL 0  . folic acid (FOLVITE) 1 MG tablet TAKE 2 TABLETS BY MOUTH EVERY DAY 180 tablet 0  . hydrOXYzine (ATARAX/VISTARIL) 25 MG tablet Take 25 mg by mouth 3 (three) times daily as needed.    . lamoTRIgine (LAMICTAL) 100 MG tablet Take 100 mg daily by mouth.    . meloxicam (MOBIC) 15 MG tablet Take 1  tablet (15 mg total) by mouth daily. 30 tablet 1  . methotrexate (RHEUMATREX) 2.5 MG tablet TAKE 8 TABLETS BY MOUTH ONCE A WEEK, CAUTION:CHEMOTHERAPY, PROTECT FROM LIGHT 96 tablet 0  . predniSONE (DELTASONE) 5 MG tablet 4 tab q am x4days, 3 tab q am x4d, 2 tab q amx4 days, 1 tab q am x4d ,1/2 tab qam x4d then  d/c 42 tablet 0  . predniSONE (DELTASONE) 5 MG tablet 4 tab q am x4days, 3 tab q am x4d, 2 tab q am x4 days, 1 tab q am x4d, then stop 40 tablet 0  . pyridOXINE (VITAMIN B-6) 50 MG tablet Take 1 tablet (50 mg total) by mouth daily. 60 tablet 0  . RESTASIS 0.05 % ophthalmic emulsion 1 drop twice daily  3  . RETIN-A 0.025 % cream Apply 1 application daily topically.  2  . sertraline (ZOLOFT) 100 MG tablet Take 100 mg daily by mouth.  1  . traZODone (DESYREL) 50 MG tablet Take 100 mg by mouth at bedtime as needed.   1  . triamcinolone cream  (KENALOG) 0.1 % Apply 1 application topically as needed.     . vitamin B-12 (CYANOCOBALAMIN) 100 MCG tablet Take 100 mcg daily by mouth.    . Adalimumab (HUMIRA PEN) 40 MG/0.8ML PNKT Inject 40 mg into the skin every 14 (fourteen) days. (Patient not taking: Reported on 06/02/2017) 1 each 0   No current facility-administered medications for this visit.     Review of Systems Review of Systems  Gastrointestinal: Positive for abdominal pain.  Genitourinary: Positive for frequency (and urge, nocturia), pelvic pain and urgency. Negative for menstrual problem, vaginal bleeding and vaginal discharge.    Blood pressure (!) 145/92, pulse 68, weight 169 lb (76.7 kg).  Physical Exam Physical Exam  Constitutional: She appears well-developed. No distress.  Pulmonary/Chest: Effort normal.  Abdominal: There is tenderness (s/p tenderness).  Genitourinary: Vagina normal. No vaginal discharge found.  Genitourinary Comments: Minimal CMT, moderate s/p tenderness over the bladder  Psychiatric: She has a normal mood and affect. Her behavior is normal.  Vitals reviewed.   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

## 2017-06-02 NOTE — Patient Instructions (Signed)
Interstitial Cystitis Interstitial cystitis is a condition that causes inflammation of the bladder. The bladder is a hollow organ in the lower part of your abdomen. It stores urine after the urine is made by your kidneys. With interstitial cystitis, you may have pain in the bladder area. You may also have a frequent and urgent need to urinate. The severity of interstitial cystitis can vary from person to person. You may have flare-ups of the condition, and then it may go away for a while. For many people who have this condition, it becomes a long-term problem. What are the causes? The cause of this condition is not known. What increases the risk? This condition is more likely to develop in women. What are the signs or symptoms? Symptoms of interstitial cystitis vary, and they can change over time. Symptoms may include:  Discomfort or pain in the bladder area. This can range from mild to severe. The pain may change in intensity as the bladder fills with urine or as it empties.  Pelvic pain.  An urgent need to urinate.  Frequent urination.  Pain during sexual intercourse.  Pinpoint bleeding on the bladder wall.  For women, the symptoms often get worse during menstruation. How is this diagnosed? This condition is diagnosed by evaluating your symptoms and ruling out other causes. A physical exam will be done. Various tests may be done to rule out other conditions. Common tests include:  Urine tests.  Cystoscopy. In this test, a tool that is like a very thin telescope is used to look into your bladder.  Biopsy. This involves taking a sample of tissue from the bladder wall to be examined under a microscope.  How is this treated? There is no cure for interstitial cystitis, but treatment methods are available to control your symptoms. Work closely with your health care provider to find the treatments that will be most effective for you. Treatment options may include:  Medicines to relieve  pain and to help reduce the number of times that you feel the need to urinate.  Bladder training. This involves learning ways to control when you urinate, such as: ? Urinating at scheduled times. ? Training yourself to delay urination. ? Doing exercises (Kegel exercises) to strengthen the muscles that control urine flow.  Lifestyle changes, such as changing your diet or taking steps to control stress.  Use of a device that provides electrical stimulation in order to reduce pain.  A procedure that stretches your bladder by filling it with air or fluid.  Surgery. This is rare. It is only done for extreme cases if other treatments do not help.  Follow these instructions at home:  Take medicines only as directed by your health care provider.  Use bladder training techniques as directed. ? Keep a bladder diary to find out which foods, liquids, or activities make your symptoms worse. ? Use your bladder diary to schedule bathroom trips. If you are away from home, plan to be near a bathroom at each of your scheduled times. ? Make sure you urinate just before you leave the house and just before you go to bed.  Do Kegel exercises as directed by your health care provider.  Do not drink alcohol.  Do not use any tobacco products, including cigarettes, chewing tobacco, or electronic cigarettes. If you need help quitting, ask your health care provider.  Make dietary changes as directed by your health care provider. You may need to avoid spicy foods and foods that contain a high amount   of potassium.  Limit your drinking of beverages that stimulate urination. These include soda, coffee, and tea.  Keep all follow-up visits as directed by your health care provider. This is important. Contact a health care provider if:  Your symptoms do not get better after treatment.  Your pain and discomfort are getting worse.  You have more frequent urges to urinate.  You have a fever. Get help right away  if:  You are not able to control your bladder at all. This information is not intended to replace advice given to you by your health care provider. Make sure you discuss any questions you have with your health care provider. Document Released: 10/12/2003 Document Revised: 07/19/2015 Document Reviewed: 10/18/2013 Elsevier Interactive Patient Education  2018 [employer]  

## 2017-06-02 NOTE — Progress Notes (Signed)
Pt here to F/U rom last visit per last notes RTO in 4 wks to review sx and repeat examination.   Pt states she has no complaints today.

## 2017-06-04 ENCOUNTER — Other Ambulatory Visit: Payer: Self-pay | Admitting: Rheumatology

## 2017-06-05 NOTE — Telephone Encounter (Signed)
Last visit: 05/21/17 Next visit: 10/22/17 Labs: 05/21/17 LFTs mildly elevated. She should avoid Mobic.  Okay to refill per Dr. Estanislado Pandy

## 2017-07-08 ENCOUNTER — Ambulatory Visit: Payer: Medicaid Other | Admitting: Physical Therapy

## 2017-07-13 ENCOUNTER — Telehealth: Payer: Self-pay | Admitting: Rheumatology

## 2017-07-13 MED FILL — ENBREL 50 MG/ML SURECLICK S: 50 | 28 days supply | Qty: 4 | Fill #1

## 2017-07-13 NOTE — Telephone Encounter (Signed)
Patient advised she should hold off Enbrel until the infection clears up.  Enbrel is not effective for back pain.  She may take Tylenol for back pain. Patient verbalized understanding. Patient states she is unsure of when she will be finished with the antibiotics.

## 2017-07-13 NOTE — Telephone Encounter (Signed)
She should hold off Enbrel until the infection clears up.  Enbrel is not effective for back pain.  She may take Tylenol for back pain.

## 2017-07-13 NOTE — Telephone Encounter (Signed)
Patient called stating that she would like to restart her Enbrel due to having pain especially in her back.  Patient states she started the Enbrel on 05/12/17 and only had 3 doses before having to discontinue due to Dr. Roselie Awkward putting her on Doxycycline.  Patient is asking if she can restart the Enbrel even though she takes the antibiotic 1 pill/per day.

## 2017-07-15 ENCOUNTER — Ambulatory Visit: Payer: Medicaid Other | Admitting: Physical Therapy

## 2017-07-22 ENCOUNTER — Encounter: Payer: Self-pay | Admitting: Physical Therapy

## 2017-07-22 ENCOUNTER — Other Ambulatory Visit: Payer: Self-pay

## 2017-07-22 ENCOUNTER — Ambulatory Visit: Payer: Medicaid Other | Attending: Adult Health | Admitting: Physical Therapy

## 2017-07-22 DIAGNOSIS — M25551 Pain in right hip: Secondary | ICD-10-CM | POA: Diagnosis present

## 2017-07-22 DIAGNOSIS — N946 Dysmenorrhea, unspecified: Secondary | ICD-10-CM | POA: Diagnosis present

## 2017-07-22 DIAGNOSIS — M6281 Muscle weakness (generalized): Secondary | ICD-10-CM | POA: Diagnosis not present

## 2017-07-22 NOTE — Therapy (Signed)
[facility] [address], [place], [place], [address] Phone: [phone]   Fax:  [phone]  Physical Therapy Evaluation  Patient Details  Name: Elizabeth Pope MRN: [id number] Date of Birth: [birth year] Referring Provider: Dr. Jimmey Ralph   Encounter Date: 07/22/2017  PT End of Session - 07/22/17 1436    Visit Number  1    Date for PT Re-Evaluation  08/19/17    Authorization Type  Medicaid    PT Start Time  1410    PT Stop Time  1445    PT Time Calculation (min)  35 min    Activity Tolerance  Patient tolerated treatment well    Behavior During Therapy  [facility] for tasks assessed/performed       Past Medical History:  Diagnosis Date  . Anxiety   . Bipolar 1 disorder ([place])   . DDD (degenerative disc disease), cervical 12/25/2015  . Depression   . GERD (gastroesophageal reflux disease) 12/25/2015  . Hypertension 12/25/2015  . Migraine   . Migraines 12/25/2015  . 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/2015   mild  . Osteoarthritis of both hands 12/25/2015  . RA (rheumatoid arthritis) ([place]) 12/25/2015   Positive RF, Elevated ESR, Positive CCP > 250     Past Surgical History:  Procedure Laterality Date  . CESAREAN SECTION    . TUBAL LIGATION      There were no vitals filed for this visit.   Subjective Assessment - 07/22/17 1414    Subjective  Patient reports her bladder pain started 1 year ago.     Patient Stated Goals  reduce pain    Currently in Pain?  Yes    Pain Score  10-Worst pain ever    Pain Location  Bladder    Pain Orientation  Mid    Pain Descriptors / Indicators  Contraction    Pain Type  Chronic pain    Pain Onset  More than a month ago    Pain Frequency  Constant    Aggravating Factors   no sure    Pain Relieving Factors  Diazapem    Multiple Pain Sites  No         [facility] PT Assessment - 07/22/17 0001      Assessment   Medical Diagnosis  R10.2 Bladder pain    Referring  Provider  Dr. Jimmey Ralph    Onset Date/Surgical Date  07/22/16    Prior Therapy  none      Precautions   Precautions  None      Restrictions   Weight Bearing Restrictions  No      Balance Screen   Has the patient fallen in the past 6 months  No    Has the patient had a decrease in activity level because of a fear of falling?   No    Is the patient reluctant to leave their home because of a fear of falling?   No      Home Film/video editor residence      Prior Function   Level of Independence  Independent      Cognition   Overall Cognitive Status  Within Functional Limits for tasks assessed      Posture/Postural Control   Posture/Postural Control  Postural limitations    Posture Comments  sits hunched over with arms across the abdomen      ROM / Strength   AROM / PROM / Strength  AROM;PROM;Strength      AROM   Lumbar Flexion  full but needs hands to help up    Lumbar - Right Side Bend  decreased by 25%    Lumbar - Left Side Bend  decreased by 25%      PROM   Right Hip External Rotation   22    Left Hip Flexion  95    Left Hip External Rotation   40      Strength   Right Hip Flexion  4/5    Right Hip Extension  4/5    Right Hip ABduction  3+/5    Left Hip Flexion  4/5    Left Hip Extension  3+/5    Left Hip ABduction  4/5      Right Hip   Right Hip Flexion  95      Palpation   SI assessment   left ilium is rotated posteriorly;     Palpation comment  pin point tenderness located on the left side of the suprapbic bone,                 Objective measurements completed on examination: See above findings.    Pelvic Floor Special Questions - 07/22/17 0001    Prior Pregnancies  Yes    Number of Pregnancies  4    Number of C-Sections  1    Number of Vaginal Deliveries  2    Currently Sexually Active  Yes    Is this Painful  Yes    Marinoff Scale  pain prevents any attempts at intercourse diazepam 1/3    Urinary Leakage  Yes     Pad use  3-4 pantyliners, sometimes changes to be fresh    Activities that cause leaking  Other;Coughing;Laughing;With strong urge    Other activities that cause leaking  has to double voiding    Urinary urgency  Yes    Fecal incontinence  No    Skin Integrity  Intact    External Palpation  no tenderness extenally    Pelvic Floor Internal Exam  Patient confirms identification and approves PT to assess pelvic floor muscles and treatment    Exam Type  Vaginal    Palpation  tenderness located in bil. levator ani and right obturator internist; unable to bulge and contract pelvic floor    Strength  weak squeeze, no lift                    PT Long Term Goals - 07/22/17 1450      PT LONG TERM GOAL #1   Title  independent with HEP and understand how to progress herself    Baseline  not educated yet    Time  4    Period  Weeks    Status  New    Target Date  08/19/17      PT LONG TERM GOAL #2   Title  ability to perform self massage to the perineum to maintain reduction of trigger points in the pelvic floor muscles    Baseline  not educated yet    Time  4    Period  Weeks    Status  New    Target Date  08/19/17      PT LONG TERM GOAL #3   Title  ability to have penile penetration due to [facility] score 1/3 due to reduction of trigger points in pelvic floor    Baseline  Marinoff score is 3/3 and  not able to have penile penetration    Time  4    Period  Weeks    Status  New    Target Date  08/19/17      PT LONG TERM GOAL #4   Title  ability to sit upright due to pain level 5/10 and improve hip mobility    Baseline  pain level 10/10 and sits in a flexed posture due to pain    Time  4    Period  Weeks    Status  New    Target Date  08/19/17      PT LONG TERM GOAL #5   Title  pelvic floor strength >/= 3/5 to reduce urinary leakage with coughing, sneezing and strong urge    Baseline  2/5,     Time  4    Period  Weeks    Status  New    Target Date  08/19/17              Plan - 07/22/17 1437    Clinical Impression Statement  Patient is a 37 year old female with bladder pain that started 1 year ago suddenly.  Patient reports her pain is 10/10 intermittently.  Patient sits in a pain posture with forward head, flexed trunk and arms covering her abdomen.  Left iliuim is rotated posteriorly. PROM of right/left hip in degrees as follows: flexion 95/95, external rotation 22/40.  Bilateral hip strenght is 4/5.  Pinpoint tenderness located in right side of pubic symphysis, bilateral levator ani, right obturator internist, lumbar paraspinals, SI joint. Patient has urinary leakage with urge to void, coughing, and laughing.  Patient will wear 4-5 light day pads.  Pelvic floor strength is 2/5 with difficulty bulging and contracting the pelvic floor.  Patient has to stop in the middle of intercourse due to pain.  Patient reports some difficulty to have a bowel movement.  Patient will benefi tfrom skilled therapy to reduce pain to improve posture, hip mobility, and function.     History and Personal Factors relevant to plan of care:  RA, bipolar, osteoarthritis bilateral hands and feet; c-section    Clinical Presentation  Evolving    Clinical Presentation due to:  patient is not able to exercise, move, or sit upright due to pain    Clinical Decision Making  Moderate    Rehab Potential  Excellent    Clinical Impairments Affecting Rehab Potential  RA, bipolar, osteoarthritis bilateral hands and feet; c-section    PT Frequency  1x / week    PT Duration  4 weeks    PT Treatment/Interventions  Biofeedback;Cryotherapy;Electrical Stimulation;Moist Heat;Ultrasound;Therapeutic exercise;Therapeutic activities;Neuromuscular re-education;Patient/family education;Scar mobilization;Manual techniques;Dry needling;Passive range of motion;Energy conservation    PT Next Visit Plan  abdominal massage; toileting technique; myofascial release to pelvic floor, stretches, diaphramatic  breathing; pelvic floor meditation    PT Home Exercise Plan  porgress as needed    Consulted and Agree with Plan of Care  Patient       Patient will benefit from skilled therapeutic intervention in order to improve the following deficits and impairments:  Increased fascial restricitons, Pain, Decreased mobility, Decreased scar mobility, Increased muscle spasms, Postural dysfunction, Decreased activity tolerance, Decreased endurance, Decreased range of motion, Decreased strength, Impaired flexibility  Visit Diagnosis: Muscle weakness (generalized) - Plan: PT plan of care cert/re-cert  Crampy pain associated with menses - Plan: PT plan of care cert/re-cert  Pain in right hip - Plan: PT plan of care cert/re-cert  Problem List Patient Active Problem List   Diagnosis Date Noted  . Pelvic pain 06/02/2017  . Atypical squamous cell changes of undetermined significance (ASCUS) on cervical cytology with negative high risk human papilloma virus (HPV) test result 02/03/2017  . Encounter for IUD insertion 01/28/2017  . Uterine fibroid 01/21/2017  . H/O tubal ligation 06/24/2016  . High risk medication use 01/07/2016  . GERD (gastroesophageal reflux disease) 12/25/2015  . Migraines 12/25/2015  . Hypertension 12/25/2015  . RA (rheumatoid arthritis) ([place]) 12/25/2015  . DDD (degenerative disc disease), cervical 12/25/2015  . Osteoarthritis of both hands 12/25/2015  . 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/2015  . 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 Bipolar 2 disorder ([place]) 05/14/2015  . Cigarette smoker 05/05/2015  . Chest pain 05/05/2015  . Solitary pulmonary nodule 05/04/2015    Earlie Counts, PT 07/22/17 3:28 PM   [facility] [address], [place], [place], [address] Phone: [phone]   Fax:  [phone]  Name: Elizabeth Pope MRN: [id number] Date of Birth: 1980/03/30

## 2017-08-05 ENCOUNTER — Encounter: Payer: Self-pay | Admitting: Physical Therapy

## 2017-08-05 ENCOUNTER — Ambulatory Visit: Payer: Medicaid Other | Attending: Adult Health | Admitting: Physical Therapy

## 2017-08-05 DIAGNOSIS — M6281 Muscle weakness (generalized): Secondary | ICD-10-CM | POA: Diagnosis present

## 2017-08-05 DIAGNOSIS — N946 Dysmenorrhea, unspecified: Secondary | ICD-10-CM | POA: Diagnosis present

## 2017-08-05 DIAGNOSIS — M25551 Pain in right hip: Secondary | ICD-10-CM

## 2017-08-05 NOTE — Therapy (Signed)
[facility] [address] [place], [place], [address] Phone: [phone]   Fax:  [phone]  Physical Therapy Treatment  Patient Details  Name: Elizabeth Pope MRN: [id number] Date of Birth: Dec 14, 1980 Referring Provider: Dr. Jimmey Ralph   Encounter Date: 08/05/2017  PT End of Session - 08/05/17 1443    Visit Number  2    Date for PT Re-Evaluation  08/19/17    Authorization Type  Medicaid    Authorization Time Period  07/27/2017-08/16/2017    Authorization - Visit Number  2    Authorization - Number of Visits  4    PT Start Time  1400    PT Stop Time  1440    PT Time Calculation (min)  40 min    Activity Tolerance  Patient tolerated treatment well    Behavior During Therapy  [facility] for tasks assessed/performed       Past Medical History:  Diagnosis Date  . Anxiety   . Bipolar 1 disorder ([place])   . DDD (degenerative disc disease), cervical 12/25/2015  . Depression   . GERD (gastroesophageal reflux disease) 12/25/2015  . Hypertension 12/25/2015  . Migraine   . Migraines 12/25/2015  . 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/2015   mild  . Osteoarthritis of both hands 12/25/2015  . RA (rheumatoid arthritis) ([place]) 12/25/2015   Positive RF, Elevated ESR, Positive CCP > 250     Past Surgical History:  Procedure Laterality Date  . CESAREAN SECTION    . TUBAL LIGATION      There were no vitals filed for this visit.  Subjective Assessment - 08/05/17 1405    Subjective  I felt fine after the initial evaluation.    Patient Stated Goals  reduce pain    Currently in Pain?  Yes    Pain Score  10-Worst pain ever    Pain Location  Bladder    Pain Orientation  Mid    Pain Descriptors / Indicators  Contraction    Pain Type  Chronic pain    Pain Onset  More than a month ago    Pain Frequency  Constant    Aggravating Factors   not sure    Pain Relieving Factors  Diazapem    Multiple Pain Sites  No                        [facility] Adult PT Treatment/Exercise - 08/05/17 0001      Manual Therapy   Manual Therapy  Soft tissue mobilization;Myofascial release    Soft tissue mobilization  diaphgram, abdominal muscles, right iliopsoas; circular massage to promote good bowel health    Myofascial Release  lower abdomen to go through all three planes of fascia             PT Education - 08/05/17 1441    Education Details  pelvic floor meditation, hip flexor stretch, abdominal massage    Person(s) Educated  Patient    Methods  Explanation;Demonstration;Verbal cues;Handout    Comprehension  Returned demonstration;Verbalized understanding          PT Long Term Goals - 07/22/17 1450      PT LONG TERM GOAL #1   Title  independent with HEP and understand how to progress herself    Baseline  not educated yet    Time  4    Period  Weeks    Status  New    Target Date  08/19/17      PT LONG TERM GOAL #2   Title  ability to perform self massage to the perineum to maintain reduction of trigger points in the pelvic floor muscles    Baseline  not educated yet    Time  4    Period  Weeks    Status  New    Target Date  08/19/17      PT LONG TERM GOAL #3   Title  ability to have penile penetration due to [facility] score 1/3 due to reduction of trigger points in pelvic floor    Baseline  Marinoff score is 3/3 and not able to have penile penetration    Time  4    Period  Weeks    Status  New    Target Date  08/19/17      PT LONG TERM GOAL #4   Title  ability to sit upright due to pain level 5/10 and improve hip mobility    Baseline  pain level 10/10 and sits in a flexed posture due to pain    Time  4    Period  Weeks    Status  New    Target Date  08/19/17      PT LONG TERM GOAL #5   Title  pelvic floor strength >/= 3/5 to reduce urinary leakage with coughing, sneezing and strong urge    Baseline  2/5,     Time  4    Period  Weeks    Status  New    Target Date   08/19/17            Plan - 08/05/17 1443    Clinical Impression Statement  After manual work pain decreased by 50%.  Patient has tightness in the right iliopsoas and hip flexor.  Patient has decreased mobility of diaphgram.  Patient has not met goals due to just starting therapy.  Patient will benefi tfrom skilled therpay to reduce pain to improve posture, hip mobility, and function.     Rehab Potential  Excellent    Clinical Impairments Affecting Rehab Potential  RA, bipolar, osteoarthritis bilateral hands and feet; c-section    PT Frequency  1x / week    PT Duration  4 weeks    PT Treatment/Interventions  Biofeedback;Cryotherapy;Electrical Stimulation;Moist Heat;Ultrasound;Therapeutic exercise;Therapeutic activities;Neuromuscular re-education;Patient/family education;Scar mobilization;Manual techniques;Dry needling;Passive range of motion;Energy conservation    PT Next Visit Plan   toileting technique; myofascial release to pelvic floor, stretches, diaphramatic breathing;     PT Home Exercise Plan  porgress as needed    Recommended Other Services  MD signed initial eval    Consulted and Agree with Plan of Care  Patient       Patient will benefit from skilled therapeutic intervention in order to improve the following deficits and impairments:  Increased fascial restricitons, Pain, Decreased mobility, Decreased scar mobility, Increased muscle spasms, Postural dysfunction, Decreased activity tolerance, Decreased endurance, Decreased range of motion, Decreased strength, Impaired flexibility  Visit Diagnosis: Muscle weakness (generalized)  Crampy pain associated with menses  Pain in right hip     Problem List Patient Active Problem List   Diagnosis Date Noted  . Pelvic pain 06/02/2017  . Atypical squamous cell changes of undetermined significance (ASCUS) on cervical cytology with negative high risk human papilloma virus (HPV) test result 02/03/2017  . Encounter for IUD insertion  01/28/2017  . Uterine fibroid 01/21/2017  . H/O tubal ligation 06/24/2016  . High risk medication use 01/07/2016  .  GERD (gastroesophageal reflux disease) 12/25/2015  . Migraines 12/25/2015  . Hypertension 12/25/2015  . RA (rheumatoid arthritis) ([place]) 12/25/2015  . DDD (degenerative disc disease), cervical 12/25/2015  . Osteoarthritis of both hands 12/25/2015  . 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/2015  . 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 Bipolar 2 disorder ([place]) 05/14/2015  . Cigarette smoker 05/05/2015  . Chest pain 05/05/2015  . Solitary pulmonary nodule 05/04/2015   Earlie Counts, PT 08/05/17 2:47 PM   [facility] [address], [place], [place], [address] Phone: [phone]   Fax:  [phone]  Name: Sharonlee Nine MRN: [id number] Date of Birth: 12/05/1980

## 2017-08-05 NOTE — Patient Instructions (Signed)
About Abdominal Massage  Abdominal massage, also called external colon massage, is a self-treatment circular massage technique that can reduce and eliminate gas and ease constipation. The colon naturally contracts in waves in a clockwise direction starting from inside the right hip, moving up toward the ribs, across the belly, and down inside the left hip.  When you perform circular abdominal massage, you help stimulate your colon's normal wave pattern of movement called peristalsis.  It is most beneficial when done after eating.  Positioning You can practice abdominal massage with oil while lying down, or in the shower with soap.  Some people find that it is just as effective to do the massage through clothing while sitting or standing.  How to Massage Start by placing your finger tips or knuckles on your right side, just inside your hip bone.  . Make small circular movements while you move upward toward your rib cage.   . Once you reach the bottom right side of your rib cage, take your circular movements across to the left side of the bottom of your rib cage.  . Next, move downward until you reach the inside of your left hip bone.  This is the path your feces travel in your colon. . Continue to perform your abdominal massage in this pattern for 10 minutes each day.     You can apply as much pressure as is comfortable in your massage.  Start gently and build pressure as you continue to practice.  Notice any areas of pain as you massage; areas of slight pain may be relieved as you massage, but if you have areas of significant or intense pain, consult with your healthcare provider.  Other Considerations . General physical activity including bending and stretching can have a beneficial massage-like effect on the colon.  Deep breathing can also stimulate the colon because breathing deeply activates the same nervous system that supplies the colon.   . Abdominal massage should always be used in  combination with a bowel-conscious diet that is high in the proper type of fiber for you, fluids (primarily water), and a regular exercise program.   Guided Meditation for Pelvic Floor Relaxation  [employer]  [employer] Quads / HF, Supine    Lie near edge of bed, one leg bent, foot flat on bed. Other leg hanging over edge, relaxed, thigh resting entirely on bed. Bend hanging knee backward keeping thigh in contact with bed. Hold _30__ seconds.  Repeat __2_ times per session. Do _2__ sessions per day.  Copyright  VHI. All rights reserved.  Therapeutic - Bridging    Lift buttocks, keeping back straight and arms on floor. Hold __1__ seconds. Repeat _10___ times.  Copyright  VHI. All rights reserved.  [facility] [address], [place], [place] [address] Phone # [phone] Fax [phone]

## 2017-08-08 ENCOUNTER — Other Ambulatory Visit: Payer: Self-pay | Admitting: Rheumatology

## 2017-08-10 NOTE — Telephone Encounter (Signed)
Last visit: 05/21/17 Next visit: 10/22/17   Okay to refill per Dr. Estanislado Pandy

## 2017-08-11 ENCOUNTER — Encounter: Payer: Self-pay | Admitting: Physical Therapy

## 2017-08-11 ENCOUNTER — Ambulatory Visit: Payer: Medicaid Other | Admitting: Physical Therapy

## 2017-08-11 DIAGNOSIS — N946 Dysmenorrhea, unspecified: Secondary | ICD-10-CM

## 2017-08-11 DIAGNOSIS — M6281 Muscle weakness (generalized): Secondary | ICD-10-CM

## 2017-08-11 DIAGNOSIS — M25551 Pain in right hip: Secondary | ICD-10-CM

## 2017-08-11 NOTE — Therapy (Signed)
[facility] [address], [place], [place], [address] Phone: [phone]   Fax:  [phone]  Physical Therapy Treatment  Patient Details  Name: Elizabeth Pope MRN: [id number] Date of Birth: 1980/08/15 Referring Provider: Dr. Jimmey Ralph   Encounter Date: 08/11/2017  PT End of Session - 08/11/17 1446    Visit Number  3    Date for PT Re-Evaluation  08/19/17    Authorization Type  Medicaid    Authorization Time Period  07/27/2017-08/16/2017    Authorization - Visit Number  3    Authorization - Number of Visits  4    PT Start Time  1400    PT Stop Time  1445    PT Time Calculation (min)  45 min    Activity Tolerance  Patient tolerated treatment well    Behavior During Therapy  [facility] for tasks assessed/performed       Past Medical History:  Diagnosis Date  . Anxiety   . Bipolar 1 disorder ([place])   . DDD (degenerative disc disease), cervical 12/25/2015  . Depression   . GERD (gastroesophageal reflux disease) 12/25/2015  . Hypertension 12/25/2015  . Migraine   . Migraines 12/25/2015  . 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/2015   mild  . Osteoarthritis of both hands 12/25/2015  . RA (rheumatoid arthritis) ([place]) 12/25/2015   Positive RF, Elevated ESR, Positive CCP > 250     Past Surgical History:  Procedure Laterality Date  . CESAREAN SECTION    . TUBAL LIGATION      There were no vitals filed for this visit.  Subjective Assessment - 08/11/17 1407    Subjective  I have pain less often.     Patient Stated Goals  reduce pain    Currently in Pain?  Yes    Pain Score  10-Worst pain ever    Pain Location  Bladder    Pain Orientation  Mid    Pain Descriptors / Indicators  Contraction    Pain Onset  More than a month ago    Pain Frequency  Constant    Aggravating Factors   not sure    Pain Relieving Factors  Diazapem    Multiple Pain Sites  No                    Pelvic Floor Special  Questions - 08/11/17 0001    Pelvic Floor Internal Exam  Patient confirms identification and approves PT to assess pelvic floor muscles and treatment    Exam Type  Vaginal    Strength  weak squeeze, no lift able to perform a lift 2 times after soft tissue work        [facility] Adult PT Treatment/Exercise - 08/11/17 0001      Manual Therapy   Manual Therapy  Internal Pelvic Floor    Internal Pelvic Floor  along bil. urethra sphincter, levator ani, obturator internist, vaginal sphincter with one hand on outside and other internally                  PT Long Term Goals - 07/22/17 1450      PT LONG TERM GOAL #1   Title  independent with HEP and understand how to progress herself    Baseline  not educated yet    Time  4    Period  Weeks    Status  New    Target Date  08/19/17  PT LONG TERM GOAL #2   Title  ability to perform self massage to the perineum to maintain reduction of trigger points in the pelvic floor muscles    Baseline  not educated yet    Time  4    Period  Weeks    Status  New    Target Date  08/19/17      PT LONG TERM GOAL #3   Title  ability to have penile penetration due to [facility] score 1/3 due to reduction of trigger points in pelvic floor    Baseline  Marinoff score is 3/3 and not able to have penile penetration    Time  4    Period  Weeks    Status  New    Target Date  08/19/17      PT LONG TERM GOAL #4   Title  ability to sit upright due to pain level 5/10 and improve hip mobility    Baseline  pain level 10/10 and sits in a flexed posture due to pain    Time  4    Period  Weeks    Status  New    Target Date  08/19/17      PT LONG TERM GOAL #5   Title  pelvic floor strength >/= 3/5 to reduce urinary leakage with coughing, sneezing and strong urge    Baseline  2/5,     Time  4    Period  Weeks    Status  New    Target Date  08/19/17            Plan - 08/11/17 1437    Clinical Impression Statement  Patient reports no  difficulty with bowel movements. Patient has fascial restrictions in the pelvic floor muscles throughout.  Patient had no pain after manual work.  Patient able to contract the pelvic floor to 2/5 but two times was able to lift the sphincter muscles.  Patient had tenderness located on the left side externally.  Patient does not have the pain as often but has the same intensity. Patient will benefit from skilled therapy to reduce pain to improve posture, hip mobility and function.     Rehab Potential  Excellent    Clinical Impairments Affecting Rehab Potential  RA, bipolar, osteoarthritis bilateral hands and feet; c-section    PT Frequency  1x / week    PT Duration  4 weeks    PT Treatment/Interventions  Biofeedback;Cryotherapy;Electrical Stimulation;Moist Heat;Ultrasound;Therapeutic exercise;Therapeutic activities;Neuromuscular re-education;Patient/family education;Scar mobilization;Manual techniques;Dry needling;Passive range of motion;Energy conservation    PT Next Visit Plan  myofascial release to pelvic floor, diaphramatic breathing; with bulging the pelvic floor; check pelvic alignment; Medicaid renewal    PT Home Exercise Plan  pelvic clock, pelvic tilt    Consulted and Agree with Plan of Care  Patient       Patient will benefit from skilled therapeutic intervention in order to improve the following deficits and impairments:  Increased fascial restricitons, Pain, Decreased mobility, Decreased scar mobility, Increased muscle spasms, Postural dysfunction, Decreased activity tolerance, Decreased endurance, Decreased range of motion, Decreased strength, Impaired flexibility  Visit Diagnosis: Muscle weakness (generalized)  Crampy pain associated with menses  Pain in right hip     Problem List Patient Active Problem List   Diagnosis Date Noted  . Pelvic pain 06/02/2017  . Atypical squamous cell changes of undetermined significance (ASCUS) on cervical cytology with negative high risk human  papilloma virus (HPV) test result 02/03/2017  . Encounter for  IUD insertion 01/28/2017  . Uterine fibroid 01/21/2017  . H/O tubal ligation 06/24/2016  . High risk medication use 01/07/2016  . GERD (gastroesophageal reflux disease) 12/25/2015  . Migraines 12/25/2015  . Hypertension 12/25/2015  . RA (rheumatoid arthritis) ([place]) 12/25/2015  . DDD (degenerative disc disease), cervical 12/25/2015  . Osteoarthritis of both hands 12/25/2015  . 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/2015  . 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 Bipolar 2 disorder ([place]) 05/14/2015  . Cigarette smoker 05/05/2015  . Chest pain 05/05/2015  . Solitary pulmonary nodule 05/04/2015    Earlie Counts, PT 08/11/17 5:04 PM   [facility] [address], [place], [place], [address] Phone: [phone]   Fax:  [phone]  Name: Charlese Gruetzmacher MRN: [id number] Date of Birth: 03-14-1980

## 2017-08-18 MED FILL — ENBREL 50 MG/ML SURECLICK S: 50 | 28 days supply | Qty: 4 | Fill #2

## 2017-08-19 ENCOUNTER — Encounter: Payer: Self-pay | Admitting: Physical Therapy

## 2017-08-19 ENCOUNTER — Ambulatory Visit: Payer: Medicaid Other | Admitting: Physical Therapy

## 2017-08-19 DIAGNOSIS — M6281 Muscle weakness (generalized): Secondary | ICD-10-CM

## 2017-08-19 DIAGNOSIS — N946 Dysmenorrhea, unspecified: Secondary | ICD-10-CM

## 2017-08-19 DIAGNOSIS — M25551 Pain in right hip: Secondary | ICD-10-CM

## 2017-08-19 NOTE — Therapy (Signed)
[facility] [address] [place], [place], [address] Phone: [phone]   Fax:  [phone]  Physical Therapy Treatment  Patient Details  Name: Elizabeth Pope MRN: [id number] Date of Birth: 09/03/1980 Referring Provider: Dr. Jimmey Ralph   Encounter Date: 08/19/2017  PT End of Session - 08/19/17 1415    Visit Number  4    Date for PT Re-Evaluation  10/21/17    Authorization Type  Medicaid    Authorization - Visit Number  4    Authorization - Number of Visits  4    PT Start Time  1400    PT Stop Time  1440    PT Time Calculation (min)  40 min    Activity Tolerance  Patient tolerated treatment well    Behavior During Therapy  [facility] for tasks assessed/performed       Past Medical History:  Diagnosis Date  . Anxiety   . Bipolar 1 disorder ([place])   . DDD (degenerative disc disease), cervical 12/25/2015  . Depression   . GERD (gastroesophageal reflux disease) 12/25/2015  . Hypertension 12/25/2015  . Migraine   . Migraines 12/25/2015  . 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/2015   mild  . Osteoarthritis of both hands 12/25/2015  . RA (rheumatoid arthritis) ([place]) 12/25/2015   Positive RF, Elevated ESR, Positive CCP > 250     Past Surgical History:  Procedure Laterality Date  . CESAREAN SECTION    . TUBAL LIGATION      There were no vitals filed for this visit.  Subjective Assessment - 08/19/17 1403    Subjective  I still have to take the pain medication 3 times.      Patient Stated Goals  reduce pain    Currently in Pain?  Yes    Pain Score  8     Pain Location  Bladder    Pain Orientation  Mid    Pain Descriptors / Indicators  Sharp    Pain Type  Chronic pain    Pain Onset  More than a month ago    Pain Frequency  Intermittent    Aggravating Factors   not sure    Pain Relieving Factors  Deazapem    Multiple Pain Sites  No         [facility] PT Assessment - 08/19/17 0001      Assessment   Medical Diagnosis  R10.2 Bladder pain    Referring Provider  Dr. Jimmey Ralph    Onset Date/Surgical Date  07/22/16    Prior Therapy  none      Precautions   Precautions  None      Restrictions   Weight Bearing Restrictions  No      Home Environment   Living Environment  Private residence      Prior Function   Level of Independence  Independent      Cognition   Overall Cognitive Status  Within Functional Limits for tasks assessed      Posture/Postural Control   Posture/Postural Control  Postural limitations    Posture Comments  sits hunched over with arms across the abdomen      AROM   Lumbar Flexion  decreased by 25% due to pain but did not have to help herself up    Lumbar - Right Side Bend  decreased by 25% with pain    Lumbar - Left Side Bend  decreased by 25% with pain      PROM  Right Hip External Rotation   50 pain    Left Hip Flexion  125    Left Hip External Rotation   40      Strength   Right Hip Flexion  4/5    Right Hip Extension  4/5    Right Hip ABduction  3+/5    Left Hip Flexion  5/5    Left Hip Extension  3+/5    Left Hip ABduction  3+/5      Palpation   SI assessment   bil. ASIS is equal    Palpation comment  less tenderness located suprapubic area                Pelvic Floor Special Questions - 08/19/17 0001    Number of Pregnancies  4    Number of C-Sections  1    Number of Vaginal Deliveries  2    Currently Sexually Active  Yes    Is this Painful  Yes    Marinoff Scale  pain prevents any attempts at intercourse diazepam 1/3    Urinary Leakage  Yes    Pad use  3-4 pantyliners, sometimes changes to be fresh    Activities that cause leaking  Other;Coughing;Laughing;With strong urge    Other activities that cause leaking  has to double voiding    Urinary urgency  Yes    Fecal incontinence  No    Pelvic Floor Internal Exam  Patient confirms identification and approves PT to assess pelvic floor muscles and treatment    Exam Type   Vaginal    Palpation  trigger point on bil. puborectalis, right pirifomris, and  the ischial spine on the right    Strength  weak squeeze, no lift able to perform a lift 2 times after soft tissue work        [facility] Adult PT Treatment/Exercise - 08/19/17 0001      Lumbar Exercises: Stretches   Quadruped Mid Back Stretch  1 rep;10 seconds hurt her left arm             PT Education - 08/19/17 1427    Education Details  Access Code: [email or web address] ; education on perineal massage to perform at home 5 minutes per day    Person(s) Educated  Patient    Methods  Explanation;Demonstration;Verbal cues;Handout    Comprehension  Returned demonstration;Verbalized understanding          PT Long Term Goals - 08/19/17 1405      PT LONG TERM GOAL #1   Title  independent with HEP and understand how to progress herself    Baseline  still learning    Time  4    Period  Weeks    Status  On-going      PT LONG TERM GOAL #2   Title  ability to perform self massage to the perineum to maintain reduction of trigger points in the pelvic floor muscles    Baseline  not educated yet    Time  4    Period  Weeks    Status  On-going      PT LONG TERM GOAL #3   Title  ability to have penile penetration due to [facility] score 1/3 due to reduction of trigger points in pelvic floor    Baseline  Marinoff score is 3/3 and not able to have penile penetration    Time  4    Period  Weeks    Status  On-going  PT LONG TERM GOAL #4   Title  ability to sit upright due to pain level 5/10 and improve hip mobility    Baseline  pain level 8/10 and sits in a flexed posture due to pain    Time  4    Period  Weeks    Status  On-going      PT LONG TERM GOAL #5   Title  pelvic floor strength >/= 3/5 to reduce urinary leakage with coughing, sneezing and strong urge    Baseline  2/5,     Time  4    Period  Weeks    Status  On-going            Plan - 08/19/17 1427    Clinical Impression Statement  Patient  pain intensity goes to 8/10 instead of 10/10 less often.  Patient has just learned how to perform perineal massage at home today.  Patient is not having penetrative intercourse at this time due to pain. Patient is abl eto sit upright with pain level 8/10. Pelvic floor strength is 2/5 due to increased tightness.  Suprapubic area is less tender.  Bilateral ASIS is equal.  Right hip ER increased to 50 degrees from 22 degrees.  Right hip flexion and extension is 4/5. Bilateral hip abduction and extension is 3+/5.  Patient will benefit from skilled therapy to reduce pain to improve posture, hip mobility and function.     Rehab Potential  Excellent    Clinical Impairments Affecting Rehab Potential  RA, bipolar, osteoarthritis bilateral hands and feet; c-section    PT Frequency  1x / week    PT Duration  Other (comment) 9 weeks    PT Treatment/Interventions  Biofeedback;Cryotherapy;Electrical Stimulation;Moist Heat;Ultrasound;Therapeutic exercise;Therapeutic activities;Neuromuscular re-education;Patient/family education;Scar mobilization;Manual techniques;Dry needling;Passive range of motion;Energy conservation    PT Next Visit Plan  myofascial release to pelvic floor, diaphramatic breathing; with bulging the pelvic floor    PT Home Exercise Plan  Access Code: [email or web address]     Consulted and Agree with Plan of Care  Patient       Patient will benefit from skilled therapeutic intervention in order to improve the following deficits and impairments:  Increased fascial restricitons, Pain, Decreased mobility, Decreased scar mobility, Increased muscle spasms, Postural dysfunction, Decreased activity tolerance, Decreased endurance, Decreased range of motion, Decreased strength, Impaired flexibility  Visit Diagnosis: Muscle weakness (generalized)  Crampy pain associated with menses  Pain in right hip     Problem List Patient Active Problem List   Diagnosis Date Noted  . Pelvic pain 06/02/2017  . Atypical  squamous cell changes of undetermined significance (ASCUS) on cervical cytology with negative high risk human papilloma virus (HPV) test result 02/03/2017  . Encounter for IUD insertion 01/28/2017  . Uterine fibroid 01/21/2017  . H/O tubal ligation 06/24/2016  . High risk medication use 01/07/2016  . GERD (gastroesophageal reflux disease) 12/25/2015  . Migraines 12/25/2015  . Hypertension 12/25/2015  . RA (rheumatoid arthritis) ([place]) 12/25/2015  . DDD (degenerative disc disease), cervical 12/25/2015  . Osteoarthritis of both hands 12/25/2015  . 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/2015  . 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 Bipolar 2 disorder ([place]) 05/14/2015  . Cigarette smoker 05/05/2015  . Chest pain 05/05/2015  . Solitary pulmonary nodule 05/04/2015    Earlie Counts, PT 08/19/17 3:44 PM   [facility] [address], [place], [place], [address] Phone: [phone]   Fax:  [phone]  Name: Elizabeth Pope MRN: [id number] Date of Birth: Jul 01, 1980

## 2017-08-19 NOTE — Patient Instructions (Signed)
Access Code: [email or web address]  URL: [email or web address]  Date: 08/19/2017  Prepared by: Earlie Counts   Exercises  Supine Pelvic Tilt - 10 reps - 1 sets - 1x daily - 7x weekly  Supine Pelvic Clock - 10 reps - 1 sets - 1x daily - 7x weekly  Supine Diagonal Pelvic Clock - 10 reps - 1 sets - 1x daily - 7x weekly  Supine Figure 4 Piriformis Stretch - 2 reps - 1 sets - 30 hold - 1x daily - 7x weekly  Prone Press Up - 5 reps - 1 sets - 1x daily - 7x weekly  Supine Butterfly Groin Stretch - 2 reps - 1 sets - 30 hold - 1x daily - 7x weekly  [facility] [address], [place], [place] [address] Phone # [phone] Fax [phone]

## 2017-09-02 ENCOUNTER — Encounter: Payer: Self-pay | Admitting: Physical Therapy

## 2017-09-02 ENCOUNTER — Ambulatory Visit: Payer: Medicaid Other | Attending: Adult Health | Admitting: Physical Therapy

## 2017-09-02 DIAGNOSIS — M25551 Pain in right hip: Secondary | ICD-10-CM

## 2017-09-02 DIAGNOSIS — M6281 Muscle weakness (generalized): Secondary | ICD-10-CM | POA: Diagnosis not present

## 2017-09-02 DIAGNOSIS — N946 Dysmenorrhea, unspecified: Secondary | ICD-10-CM | POA: Diagnosis present

## 2017-09-02 NOTE — Patient Instructions (Signed)

## 2017-09-02 NOTE — Therapy (Addendum)
[facility] [address] [place], [place], [address] Phone: [phone]   Fax:  [phone]  Physical Therapy Treatment  Patient Details  Name: Elizabeth Pope MRN: [id number] Date of Birth: 11-Sep-1980 Referring Provider: Dr. Jimmey Ralph   Encounter Date: 09/02/2017  PT End of Session - 09/02/17 1615    Visit Number  5    Date for PT Re-Evaluation  10/21/17    Authorization Type  Medicaid    Authorization Time Period  07/27/2017-08/16/2017    Authorization - Visit Number  1    Authorization - Number of Visits  9    PT Start Time  8828    PT Stop Time  1540    PT Time Calculation (min)  55 min    Activity Tolerance  Patient tolerated treatment well    Behavior During Therapy  [facility] for tasks assessed/performed       Past Medical History:  Diagnosis Date  . Anxiety   . Bipolar 1 disorder ([place])   . DDD (degenerative disc disease), cervical 12/25/2015  . Depression   . GERD (gastroesophageal reflux disease) 12/25/2015  . Hypertension 12/25/2015  . Migraine   . Migraines 12/25/2015  . 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/2015   mild  . Osteoarthritis of both hands 12/25/2015  . RA (rheumatoid arthritis) ([place]) 12/25/2015   Positive RF, Elevated ESR, Positive CCP > 250     Past Surgical History:  Procedure Laterality Date  . CESAREAN SECTION    . TUBAL LIGATION      There were no vitals filed for this visit.  Subjective Assessment - 09/02/17 1448    Subjective  When I leave I hurt. Pain is intermittent.  Sit in a flexed posture. I am not leaking urine.     Patient Stated Goals  reduce pain    Currently in Pain?  Yes    Pain Score  6     Pain Location  Bladder    Pain Orientation  Mid    Pain Descriptors / Indicators  Sharp    Pain Type  Chronic pain    Pain Onset  More than a month ago    Pain Frequency  Intermittent    Aggravating Factors   not sure    Pain Relieving Factors  medication          [facility] PT Assessment - 09/02/17 0001      Right Hip   Right Hip Flexion  112      Palpation   SI assessment   bil. ASIS is equal                   [facility] Adult PT Treatment/Exercise - 09/02/17 0001      Modalities   Modalities  Electrical Stimulation;Moist Heat      Moist Heat Therapy   Number Minutes Moist Heat  20 Minutes    Moist Heat Location  Lumbar Spine abdominal      Electrical Stimulation   Electrical Stimulation Location  lumbar and abdominal    Electrical Stimulation Action  IFC    Electrical Stimulation Parameters  .to patient tolerance, 20 minutes    Electrical Stimulation Goals  Pain      Manual Therapy   Manual Therapy  Soft tissue mobilization;Joint mobilization    Joint Mobilization  P-A and joint mobilization to L1-L5    Soft tissue mobilization  lumbar paraspinals, abdominal muscles,     Myofascial Release  along  the the transverse abdominius, along the left quadratus, bil. lower quadrants of the abdominal       Trigger Point Dry Needling - 09/02/17 1504    Consent Given?  Yes    Education Handout Provided  Yes    Muscles Treated Upper Body  -- lumbar multifidi T12 L5    Muscles Treated Lower Body  -- trigger point, elongation of muscle           PT Education - 09/02/17 1615    Education Details  information on dry needling    Person(s) Educated  Patient    Methods  Explanation;Handout    Comprehension  Verbalized understanding          PT Long Term Goals - 08/19/17 1405      PT LONG TERM GOAL #1   Title  independent with HEP and understand how to progress herself    Baseline  still learning    Time  4    Period  Weeks    Status  On-going      PT LONG TERM GOAL #2   Title  ability to perform self massage to the perineum to maintain reduction of trigger points in the pelvic floor muscles    Baseline  not educated yet    Time  4    Period  Weeks    Status  On-going      PT LONG TERM GOAL #3   Title  ability to  have penile penetration due to [facility] score 1/3 due to reduction of trigger points in pelvic floor    Baseline  Marinoff score is 3/3 and not able to have penile penetration    Time  4    Period  Weeks    Status  On-going      PT LONG TERM GOAL #4   Title  ability to sit upright due to pain level 5/10 and improve hip mobility    Baseline  pain level 8/10 and sits in a flexed posture due to pain    Time  4    Period  Weeks    Status  On-going      PT LONG TERM GOAL #5   Title  pelvic floor strength >/= 3/5 to reduce urinary leakage with coughing, sneezing and strong urge    Baseline  2/5,     Time  4    Period  Weeks    Status  On-going            Plan - 09/02/17 1707    Clinical Impression Statement  Patient reports no change in pain.  Patient came in sitting with a flexed posture. Patient pelvis is in correct alignment.  Patient reports no urinary leakage at this time. Patient had referral of pain into the pelvic floor with dry needling  to the left lumbar multidi.  Patient has tightness in the lumbar region.  The abdominal muscles were softer.  Patient had trigger points in bilateral psoas. Patient will benefit from skille dtherapy to reduce pain to improve posture, hip mobiiyt and function.     Rehab Potential  Excellent    PT Frequency  1x / week    PT Duration  Other (comment) 9 weeks    PT Treatment/Interventions  Biofeedback;Cryotherapy;Electrical Stimulation;Moist Heat;Ultrasound;Therapeutic exercise;Therapeutic activities;Neuromuscular re-education;Patient/family education;Scar mobilization;Manual techniques;Dry needling;Passive range of motion;Energy conservation    PT Next Visit Plan  myofascial release to pelvic floor, diaphramatic breathing; with bulging the pelvic floor    PT  Home Exercise Plan  Access Code: [email or web address]     Consulted and Agree with Plan of Care  Patient       Patient will benefit from skilled therapeutic intervention in order to improve the  following deficits and impairments:  Increased fascial restricitons, Pain, Decreased mobility, Decreased scar mobility, Increased muscle spasms, Postural dysfunction, Decreased activity tolerance, Decreased endurance, Decreased range of motion, Decreased strength, Impaired flexibility  Visit Diagnosis: Muscle weakness (generalized)  Crampy pain associated with menses  Pain in right hip     Problem List Patient Active Problem List   Diagnosis Date Noted  . Pelvic pain 06/02/2017  . Atypical squamous cell changes of undetermined significance (ASCUS) on cervical cytology with negative high risk human papilloma virus (HPV) test result 02/03/2017  . Encounter for IUD insertion 01/28/2017  . Uterine fibroid 01/21/2017  . H/O tubal ligation 06/24/2016  . High risk medication use 01/07/2016  . GERD (gastroesophageal reflux disease) 12/25/2015  . Migraines 12/25/2015  . Hypertension 12/25/2015  . RA (rheumatoid arthritis) ([place]) 12/25/2015  . DDD (degenerative disc disease), cervical 12/25/2015  . Osteoarthritis of both hands 12/25/2015  . 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/2015  . 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 Bipolar 2 disorder ([place]) 05/14/2015  . Cigarette smoker 05/05/2015  . Chest pain 05/05/2015  . Solitary pulmonary nodule 05/04/2015    Earlie Counts, PT 09/02/17 5:11 PM   [facility] [address] [place], [place], [address] Phone: [phone]   Fax:  [phone]  Name: Elizabeth Pope MRN: [id number] Date of Birth: [birth year] PHYSICAL THERAPY DISCHARGE SUMMARY  Visits from Start of Care: 5  Current functional level related to goals / functional outcomes: See above.    Remaining deficits: See above. Patient has not returned to physical therapy since 09/02/2017.  Patient has no-showed for her last scheduled visit.    Education / Equipment: HEP Plan:                                                     Patient goals were not met. Patient is being discharged due to not returning since the last visit.  Thank you for the referral. Earlie Counts, PT 10/21/17 12:19 PM  ?????

## 2017-09-08 ENCOUNTER — Other Ambulatory Visit: Payer: Self-pay | Admitting: Rheumatology

## 2017-09-08 DIAGNOSIS — Z9225 Personal history of immunosupression therapy: Secondary | ICD-10-CM

## 2017-09-08 DIAGNOSIS — Z79899 Other long term (current) drug therapy: Secondary | ICD-10-CM

## 2017-09-09 MED FILL — ENBREL 50 MG/ML SURECLICK S: 50 | 28 days supply | Qty: 4 | Fill #0

## 2017-09-09 NOTE — Telephone Encounter (Addendum)
Last visit: 05/21/17 Next visit: 10/22/17 Labs: 05/21/17 TB Gold: 09/09/16 Neg  Patient advised she is due for labs. Patient coming to update labs.   Okay to refill 30 day supply  per Dr. Estanislado Pandy

## 2017-09-24 ENCOUNTER — Telehealth: Payer: Self-pay | Admitting: Rheumatology

## 2017-09-24 NOTE — Telephone Encounter (Signed)
Please a schedule an appointment.

## 2017-09-24 NOTE — Telephone Encounter (Signed)
Patient called stating she fell last week and now both her knees are painful.  Patient is requesting a prescription of Prednisone to be sent to  [facility] on [address]

## 2017-09-24 NOTE — Telephone Encounter (Signed)
Patient states her legs feel like they give out on her when she is walking and she has fallen several times. Patient states she doesn't think her knees are swollen, they are just painful. Patient states she is walking on her "tip toes" to keep her knees from hurting. Patient is requesting a prednisone taper. Please advise.

## 2017-09-24 NOTE — Telephone Encounter (Signed)
Attempted to call patient and left message on machine to advise patient to call the office.

## 2017-09-25 NOTE — Telephone Encounter (Signed)
Patient is scheduled for an appointment on 09/28/2017 @ 10:40am.

## 2017-09-25 NOTE — Telephone Encounter (Signed)
Attempted to call patient and left message on machine for patient to call the office.  

## 2017-09-25 NOTE — Progress Notes (Addendum)
Office Visit Note  Patient: Elizabeth Pope             Date of Birth: 08-26-1980           MRN: [id number]             PCP: Kristie Cowman, MD Referring: Kristie Cowman, MD Visit Date: 09/28/2017 Occupation: _0 @  Subjective:  Arthritis (Bil knee pain, bil hand pain, fell x 2)   History of Present Illness: Grainne Knights is a 37 y.o. female with history of rheumatoid arthritis.  She states she has fallen twice in the last month.  She states that her knees joints give out on her when she is walking.  She denies any joint swelling.  She continues to have some discomfort in her bilateral hands.  Activities of Daily Living:  Patient reports morning stiffness for 1.5 hours.   Patient Denies nocturnal pain.  Difficulty dressing/grooming: Reports Difficulty climbing stairs: Reports Difficulty getting out of chair: Reports Difficulty using hands for taps, buttons, cutlery, and/or writing: Reports  Review of Systems  Constitutional: Positive for fatigue. Negative for activity change, night sweats, weight gain and weight loss.  HENT: Positive for mouth dryness and nose dryness. Negative for mouth sores, trouble swallowing and trouble swallowing.   Eyes: Positive for dryness. Negative for pain, discharge, redness, itching and visual disturbance.  Respiratory: Positive for wheezing. Negative for cough, shortness of breath and difficulty breathing.   Cardiovascular: Negative for chest pain, palpitations, hypertension, irregular heartbeat and swelling in legs/feet.  Gastrointestinal: Positive for heartburn and nausea. Negative for blood in stool, constipation, diarrhea and vomiting.  Endocrine: Negative for cold intolerance, heat intolerance, excessive thirst and increased urination.  Genitourinary: Negative for difficulty urinating and vaginal dryness.  Musculoskeletal: Positive for arthralgias, gait problem, joint pain, morning stiffness and muscle tenderness. Negative for  joint swelling, myalgias, muscle weakness and myalgias.  Skin: Negative for color change, rash, hair loss, skin tightness, ulcers and sensitivity to sunlight.  Allergic/Immunologic: Negative for susceptible to infections.  Neurological: Negative for dizziness, light-headedness, numbness, headaches, memory loss, night sweats and weakness.  Hematological: Negative for bruising/bleeding tendency and swollen glands.  Psychiatric/Behavioral: Positive for sleep disturbance. Negative for depressed mood. The patient is not nervous/anxious.     PMFS History:  Patient Active Problem List   Diagnosis Date Noted  . Pelvic pain 06/02/2017  . Atypical squamous cell changes of undetermined significance (ASCUS) on cervical cytology with negative high risk human papilloma virus (HPV) test result 02/03/2017  . Encounter for IUD insertion 01/28/2017  . Uterine fibroid 01/21/2017  . H/O tubal ligation 06/24/2016  . High risk medication use 01/07/2016  . GERD (gastroesophageal reflux disease) 12/25/2015  . Migraines 12/25/2015  . Hypertension 12/25/2015  . RA (rheumatoid arthritis) ([place]) 12/25/2015  . DDD (degenerative disc disease), cervical 12/25/2015  . Osteoarthritis of both hands 12/25/2015  . 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/2015  . 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 Bipolar 2 disorder ([place]) 05/14/2015  . Cigarette smoker 05/05/2015  . Chest pain 05/05/2015  . Solitary pulmonary nodule 05/04/2015    Past Medical History:  Diagnosis Date  . Anxiety   . Bipolar 1 disorder ([place])   . DDD (degenerative disc disease), cervical 12/25/2015  . Depression   . GERD (gastroesophageal reflux disease) 12/25/2015  . Hypertension 12/25/2015  . Migraine   . Migraines 12/25/2015  . 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/2015   mild  . Osteoarthritis of  both hands 12/25/2015  . RA (rheumatoid arthritis) ([facility]) 12/25/2015   Positive RF, Elevated ESR, Positive CCP > 250       Family History  Problem Relation Age of Onset  . Rheum arthritis Mother   . Migraines Mother   . Hypertension Mother   . Colitis Mother   . Rheum arthritis Maternal Grandmother   . Rheum arthritis Maternal Aunt   . Migraines Sister   . Migraines Brother   . Sickle cell anemia Daughter   . Bipolar disorder Daughter   . Anxiety disorder Daughter   . Depression Daughter   . Migraines Daughter   . Arrhythmia Daughter   . Migraines Son   . ADD / ADHD Son   . Seizures Son   . Arrhythmia Son    Past Surgical History:  Procedure Laterality Date  . CESAREAN SECTION    . TUBAL LIGATION     Social History   Social History Narrative  . Not on file    Objective: Vital Signs: BP (!) 143/99 (BP Location: Left Arm, Patient Position: Sitting, Cuff Size: Normal)   Pulse 69   Resp 14   Ht _0  (1.651 m)   Wt 164 lb (74.4 kg)   BMI 27.29 kg/m    Physical Exam  Constitutional: She is oriented to person, place, and time. She appears well-developed and well-nourished.  HENT:  Head: Normocephalic and atraumatic.  Eyes: Conjunctivae and EOM are normal.  Neck: Normal range of motion.  Cardiovascular: Normal rate, regular rhythm, normal heart sounds and intact distal pulses.  Pulmonary/Chest: Effort normal and breath sounds normal.  Abdominal: Soft. Bowel sounds are normal.  Lymphadenopathy:    She has no cervical adenopathy.  Neurological: She is alert and oriented to person, place, and time.  Skin: Skin is warm and dry. Capillary refill takes less than 2 seconds.  Psychiatric: She has a normal mood and affect. Her behavior is normal.  Nursing note and vitals reviewed.    Musculoskeletal Exam: C-spine thoracic spine good range of motion.  She is painful range of motion of lumbar spine.  Shoulder joints elbow joints wrist joint MCPs PIPs DIPs were in good range of motion with no synovitis.  She does have some thickening of PIP and DIP joints.  Hip joints knee joints ankles MTPs  PIPs were in good range of motion with no synovitis.  She had painful range of motion of bilateral knee joints without any warmth swelling or effusion.  CDAI Exam: CDAI Homunculus Exam:   Tenderness:  RLE: tibiofemoral LLE: tibiofemoral  Joint Counts:  CDAI Tender Joint count: 2 CDAI Swollen Joint count: 0  Global Assessments:  Patient Global Assessment: 2 Provider Global Assessment: 2  CDAI Calculated Score: 6   Investigation: No additional findings.  Imaging: Xr Knee 3 View Left  Result Date: 09/28/2017 Mild medial compartment narrowing was noted.  Moderate patellofemoral narrowing was noted. Impression: These findings are consistent mild osteoarthritis and moderate chondromalacia patella of the knee joint.  Xr Knee 3 View Right  Result Date: 09/28/2017 Mild medial compartment narrowing was noted.  Moderate patellofemoral narrowing was noted. Impression: These findings are consistent mild osteoarthritis and moderate chondromalacia patella of the knee joint.  Xr Lumbar Spine 2-3 Views  Result Date: 09/28/2017 No significant disc space narrowing was noted.  Mild facet joint arthropathy was noted.  No SI joint changes were noted. Impression: Unremarkable x-ray of the lumbar spine.   Recent Labs: Lab Results  Component Value Date  WBC 6.1 05/21/2017   HGB 13.0 05/21/2017   PLT 334 05/21/2017   NA 140 05/21/2017   K 4.7 05/21/2017   CL 108 05/21/2017   CO2 27 05/21/2017   GLUCOSE 104 (H) 05/21/2017   BUN 14 05/21/2017   CREATININE 0.91 05/21/2017   BILITOT 0.3 05/21/2017   ALKPHOS 49 09/09/2016   AST 27 05/21/2017   ALT 40 (H) 05/21/2017   PROT 7.1 05/21/2017   ALBUMIN 4.1 09/09/2016   CALCIUM 9.5 05/21/2017   GFRAA 93 05/21/2017    Speciality Comments: PLQ Eye Exam: WNL 03/24/17 @ [facility]  Procedures:  No procedures performed Allergies: Fluoxetine; Humira [adalimumab]; Hydrocodone-acetaminophen; Hydroxychloroquine; Other; and Vicodin  [hydrocodone-acetaminophen]   Assessment / Plan:     Visit Diagnoses: Rheumatoid arthritis involving multiple sites with positive rheumatoid factor (HCC)-she had no active synovitis on examination.  High risk medication use - Enbrel and MTX 8 tablets by mouth once week and folic acid 2 mg daily(Humira caused facial rash)  - Plan: COMPLETE METABOLIC PANEL WITH GFR, CBC with Differential/Platelet today and then every 3 months to monitor for drug toxicity.    History of immunosuppressive therapy - Plan: QuantiFERON-TB Gold Plus   Chronic pain of both knees -patient complains of pain in her bilateral knee joints.  She also states that her knees give out on her and she had fallen twice in the last month.  Plan: XR KNEE 3 VIEW RIGHT, XR KNEE 3 VIEW LEFT.  The x-rays showed mild osteoarthritis and moderate chondromalacia patella.  We will refer her to physical therapy for lower extremity muscle strengthening exercises and fall prevention.  Chondromalacia of both patellae  Chronic midline low back pain without sciatica -she has been having increased lower back pain and spasms.  Plan: XR Lumbar Spine 2-3 Views.  The x-ray of the lumbar spine was unremarkable.  I will refer to physical therapy for core muscle strengthening exercises.  Primary osteoarthritis of both hands-she had no synovitis on examination only some osteoarthritic changes were noted.  Primary osteoarthritis of both feet  DDD (degenerative disc disease), cervical  Solitary pulmonary nodule  Bipolar 2 disorder ([place])  Essential hypertension   Orders: Orders Placed This Encounter  Procedures  . XR Lumbar Spine 2-3 Views  . XR KNEE 3 VIEW RIGHT  . XR KNEE 3 VIEW LEFT  . Ambulatory referral to Physical Therapy   No orders of the defined types were placed in this encounter.   Face-to-face time spent with patient was 30 minutes. Greater than 50% of time was spent in counseling and coordination of care.  Follow-Up Instructions:  Return in about 5 months (around 02/28/2018) for Rheumatoid arthritis.   Bo Merino, MD  Note - This record has been created using Editor, commissioning.  Chart creation errors have been sought, but may not always  have been located. Such creation errors do not reflect on  the standard of medical care.

## 2017-09-28 ENCOUNTER — Encounter: Payer: Self-pay | Admitting: Physician Assistant

## 2017-09-28 ENCOUNTER — Ambulatory Visit: Payer: Medicaid Other | Admitting: Rheumatology

## 2017-09-28 ENCOUNTER — Ambulatory Visit (INDEPENDENT_AMBULATORY_CARE_PROVIDER_SITE_OTHER): Payer: Self-pay

## 2017-09-28 VITALS — BP 143/99 | HR 69 | Resp 14 | Ht 65.0 in | Wt 164.0 lb

## 2017-09-28 DIAGNOSIS — I10 Essential (primary) hypertension: Secondary | ICD-10-CM

## 2017-09-28 DIAGNOSIS — M545 Low back pain, unspecified: Secondary | ICD-10-CM

## 2017-09-28 DIAGNOSIS — M25562 Pain in left knee: Secondary | ICD-10-CM | POA: Diagnosis not present

## 2017-09-28 DIAGNOSIS — F3181 Bipolar II disorder: Secondary | ICD-10-CM

## 2017-09-28 DIAGNOSIS — Z9225 Personal history of immunosupression therapy: Secondary | ICD-10-CM

## 2017-09-28 DIAGNOSIS — Z79899 Other long term (current) drug therapy: Secondary | ICD-10-CM | POA: Diagnosis not present

## 2017-09-28 DIAGNOSIS — M0579 Rheumatoid arthritis with rheumatoid factor of multiple sites without organ or systems involvement: Secondary | ICD-10-CM

## 2017-09-28 DIAGNOSIS — G8929 Other chronic pain: Secondary | ICD-10-CM

## 2017-09-28 DIAGNOSIS — R911 Solitary pulmonary nodule: Secondary | ICD-10-CM

## 2017-09-28 DIAGNOSIS — M25561 Pain in right knee: Secondary | ICD-10-CM | POA: Diagnosis not present

## 2017-09-28 DIAGNOSIS — M19072 Primary osteoarthritis, left ankle and foot: Secondary | ICD-10-CM

## 2017-09-28 DIAGNOSIS — M5030 Other cervical disc degeneration, unspecified cervical region: Secondary | ICD-10-CM

## 2017-09-28 DIAGNOSIS — M19071 Primary osteoarthritis, right ankle and foot: Secondary | ICD-10-CM

## 2017-09-28 DIAGNOSIS — M2242 Chondromalacia patellae, left knee: Secondary | ICD-10-CM

## 2017-09-28 DIAGNOSIS — M19042 Primary osteoarthritis, left hand: Secondary | ICD-10-CM

## 2017-09-28 DIAGNOSIS — M2241 Chondromalacia patellae, right knee: Secondary | ICD-10-CM

## 2017-09-28 DIAGNOSIS — M19041 Primary osteoarthritis, right hand: Secondary | ICD-10-CM

## 2017-09-28 NOTE — Patient Instructions (Signed)
Standing Labs We placed an order today for your standing lab work.    Please come back and get your standing labs in November and every 3 months   We have open lab Monday through Friday from 8:30-11:30 AM and 1:30-4:00 PM  at the office of Dr. Shayna Eblen.   You may experience shorter wait times on Monday and Friday afternoons. The office is located at [address], [address], [place], [place] [address] No appointment is necessary.   Labs are drawn by [facility].  You may receive a bill from [facility] for your lab work. If you have any questions regarding directions or hours of operation,  please call [phone].     

## 2017-09-30 LAB — COMPLETE METABOLIC PANEL WITH GFR
AG Ratio: 1.2 (calc) (ref 1.0–2.5)
ALBUMIN MSPROF: 4.2 g/dL (ref 3.6–5.1)
ALKALINE PHOSPHATASE (APISO): 55 U/L (ref 33–115)
ALT: 24 U/L (ref 6–29)
AST: 20 U/L (ref 10–30)
BUN: 11 mg/dL (ref 7–25)
CHLORIDE: 104 mmol/L (ref 98–110)
CO2: 25 mmol/L (ref 20–32)
Calcium: 9.7 mg/dL (ref 8.6–10.2)
Creat: 0.93 mg/dL (ref 0.50–1.10)
GFR, Est African American: 91 mL/min/{1.73_m2} (ref >=60)
GFR, Est Non African American: 79 mL/min/{1.73_m2} (ref >=60)
GLUCOSE: 93 mg/dL (ref 65–99)
Globulin: 3.4 g/dL (calc) (ref 1.9–3.7)
Potassium: 4.3 mmol/L (ref 3.5–5.3)
Sodium: 137 mmol/L (ref 135–146)
Total Bilirubin: 0.4 mg/dL (ref 0.2–1.2)
Total Protein: 7.6 g/dL (ref 6.1–8.1)

## 2017-09-30 LAB — CBC WITH DIFFERENTIAL/PLATELET
Basophils Absolute: 48 cells/uL (ref 0–200)
Basophils Relative: 0.7 %
EOS PCT: 7.8 %
Eosinophils Absolute: 538 cells/uL — ABNORMAL HIGH (ref 15–500)
HEMATOCRIT: 38.7 % (ref 35.0–45.0)
Hemoglobin: 13.8 g/dL (ref 11.7–15.5)
LYMPHS ABS: 2367 {cells}/uL (ref 850–3900)
MCH: 30.8 pg (ref 27.0–33.0)
MCHC: 35.7 g/dL (ref 32.0–36.0)
MCV: 86.4 fL (ref 80.0–100.0)
MONOS PCT: 7 %
MPV: 10.1 fL (ref 7.5–12.5)
NEUTROS PCT: 50.2 %
Neutro Abs: 3464 cells/uL (ref 1500–7800)
PLATELETS: 329 10*3/uL (ref 140–400)
RBC: 4.48 10*6/uL (ref 3.80–5.10)
RDW: 15.2 % — AB (ref 11.0–15.0)
Total Lymphocyte: 34.3 %
WBC mixed population: 483 cells/uL (ref 200–950)
WBC: 6.9 10*3/uL (ref 3.8–10.8)

## 2017-09-30 LAB — QUANTIFERON-TB GOLD PLUS
Mitogen-NIL: >10 IU/mL
NIL: 0.13 [IU]/mL
QUANTIFERON-TB GOLD PLUS: NEGATIVE
TB1-NIL: <0 IU/mL

## 2017-10-05 ENCOUNTER — Other Ambulatory Visit: Payer: Self-pay | Admitting: Rheumatology

## 2017-10-05 NOTE — Telephone Encounter (Signed)
Last visit: 09/28/2017 Next visit: 03/04/2018 Labs: 09/28/2017 CBC stable. CMP WNL. TB Gold: 09/28/2017 negative   Okay to refill per Dr. Estanislado Pandy.

## 2017-10-07 ENCOUNTER — Ambulatory Visit: Payer: Medicaid Other | Admitting: Physical Therapy

## 2017-10-07 ENCOUNTER — Encounter: Payer: Medicaid Other | Admitting: Physical Therapy

## 2017-10-12 MED FILL — ENBREL 50 MG/ML SURECLICK S: 50 | 28 days supply | Qty: 4 | Fill #0

## 2017-10-14 ENCOUNTER — Ambulatory Visit: Payer: Medicaid Other | Attending: Adult Health | Admitting: Physical Therapy

## 2017-10-14 ENCOUNTER — Telehealth: Payer: Self-pay | Admitting: Physical Therapy

## 2017-10-14 ENCOUNTER — Encounter (HOSPITAL_COMMUNITY): Payer: Self-pay | Admitting: Emergency Medicine

## 2017-10-14 ENCOUNTER — Ambulatory Visit (INDEPENDENT_AMBULATORY_CARE_PROVIDER_SITE_OTHER): Payer: Medicaid Other

## 2017-10-14 ENCOUNTER — Ambulatory Visit (HOSPITAL_COMMUNITY)
Admission: EM | Admit: 2017-10-14 | Discharge: 2017-10-14 | Disposition: A | Discharge status: Home / Self Care | Payer: Medicaid Other | Attending: Family Medicine | Admitting: Family Medicine

## 2017-10-14 ENCOUNTER — Other Ambulatory Visit: Payer: Self-pay

## 2017-10-14 DIAGNOSIS — S6992XA Unspecified injury of left wrist, hand and finger(s), initial encounter: Secondary | ICD-10-CM

## 2017-10-14 IMAGING — DX DG HAND COMPLETE 3+V*L*
3 series · 3 of 3 positions shown · non-contrast
Comparison: None.

CLINICAL DATA: Softball injury. Hyperextension injury to left thumb
and hand.

EXAM:
LEFT HAND - COMPLETE 3+ VIEW

[hand pa]
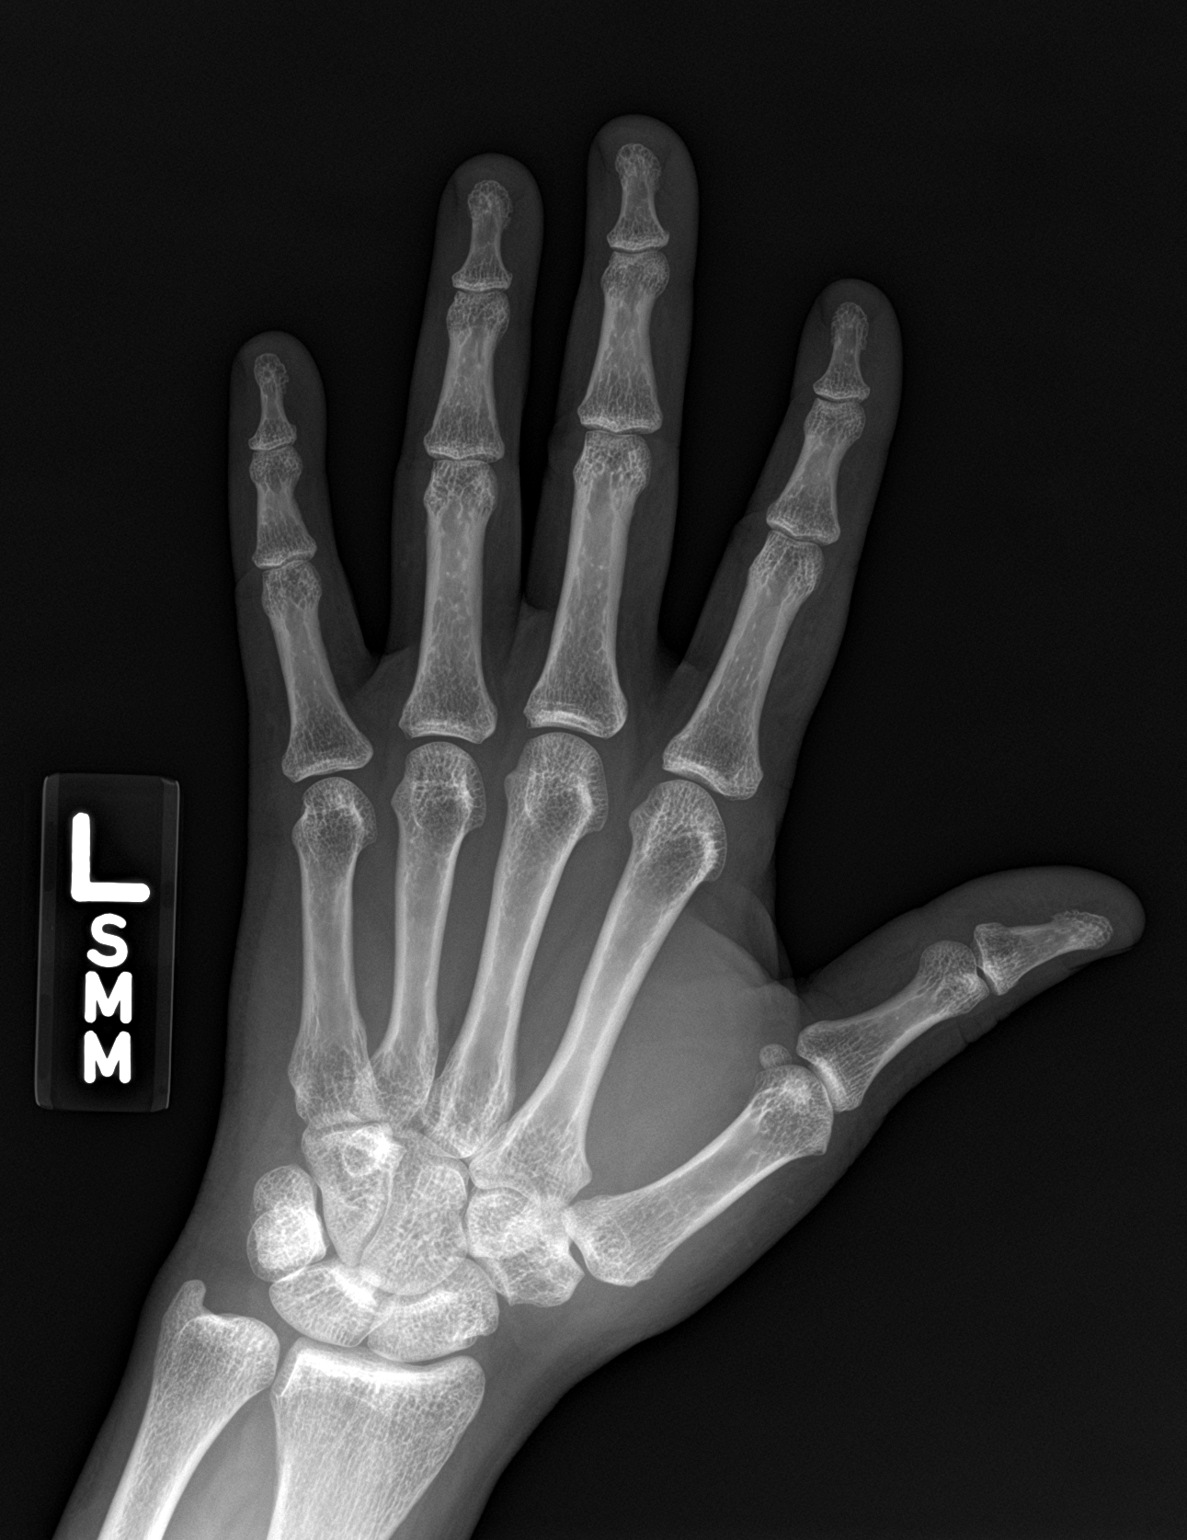

[hand obl]
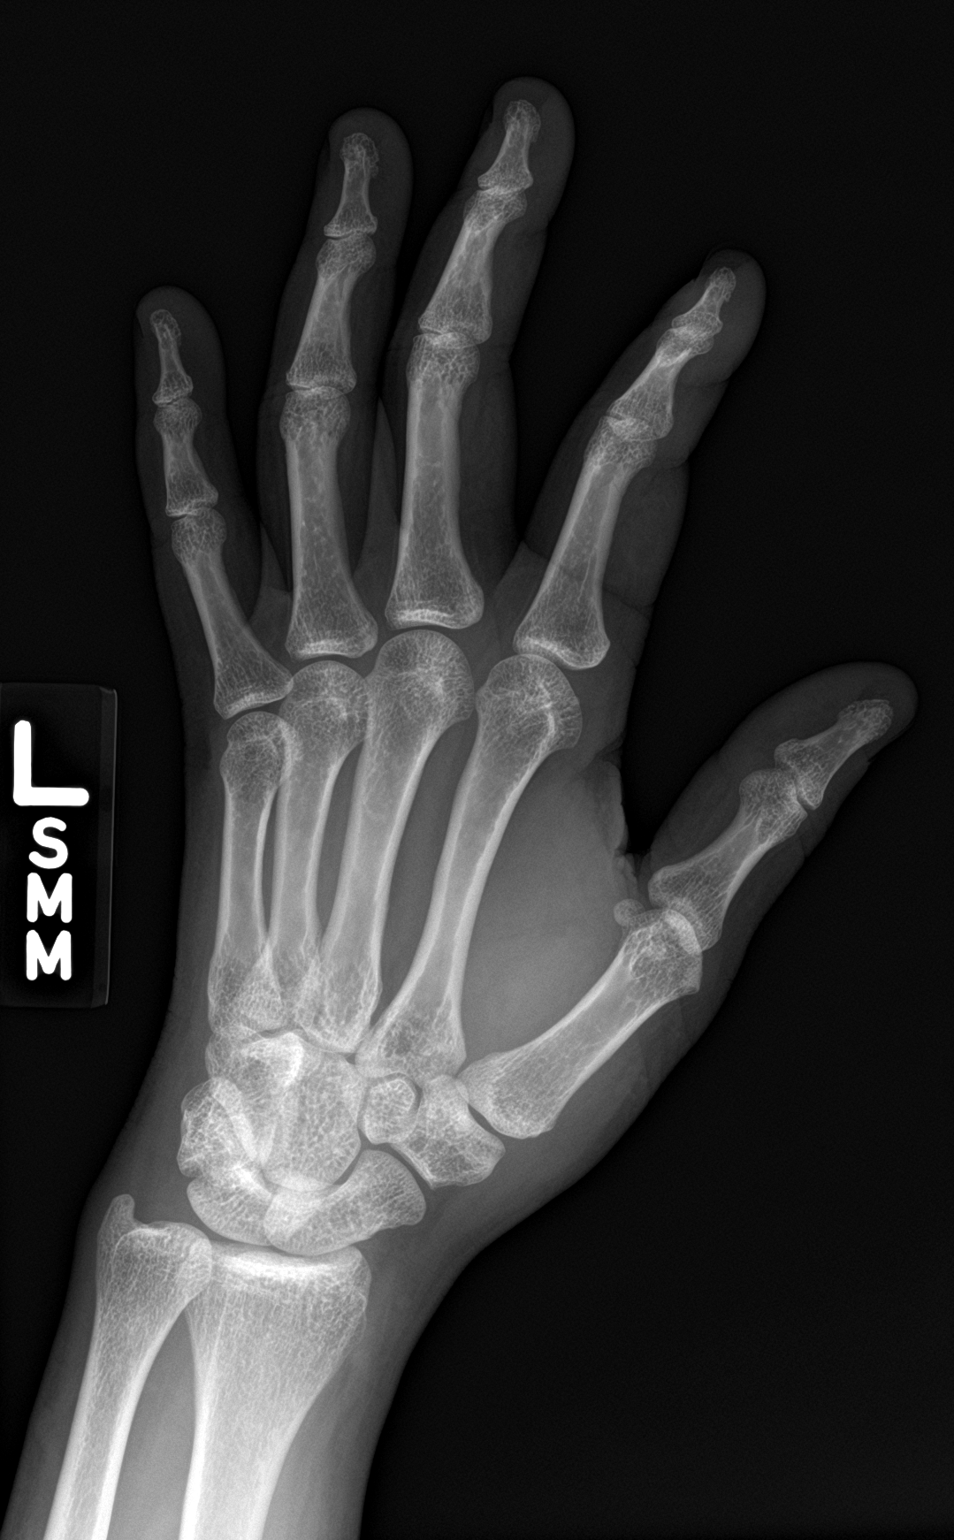

[hand lat]
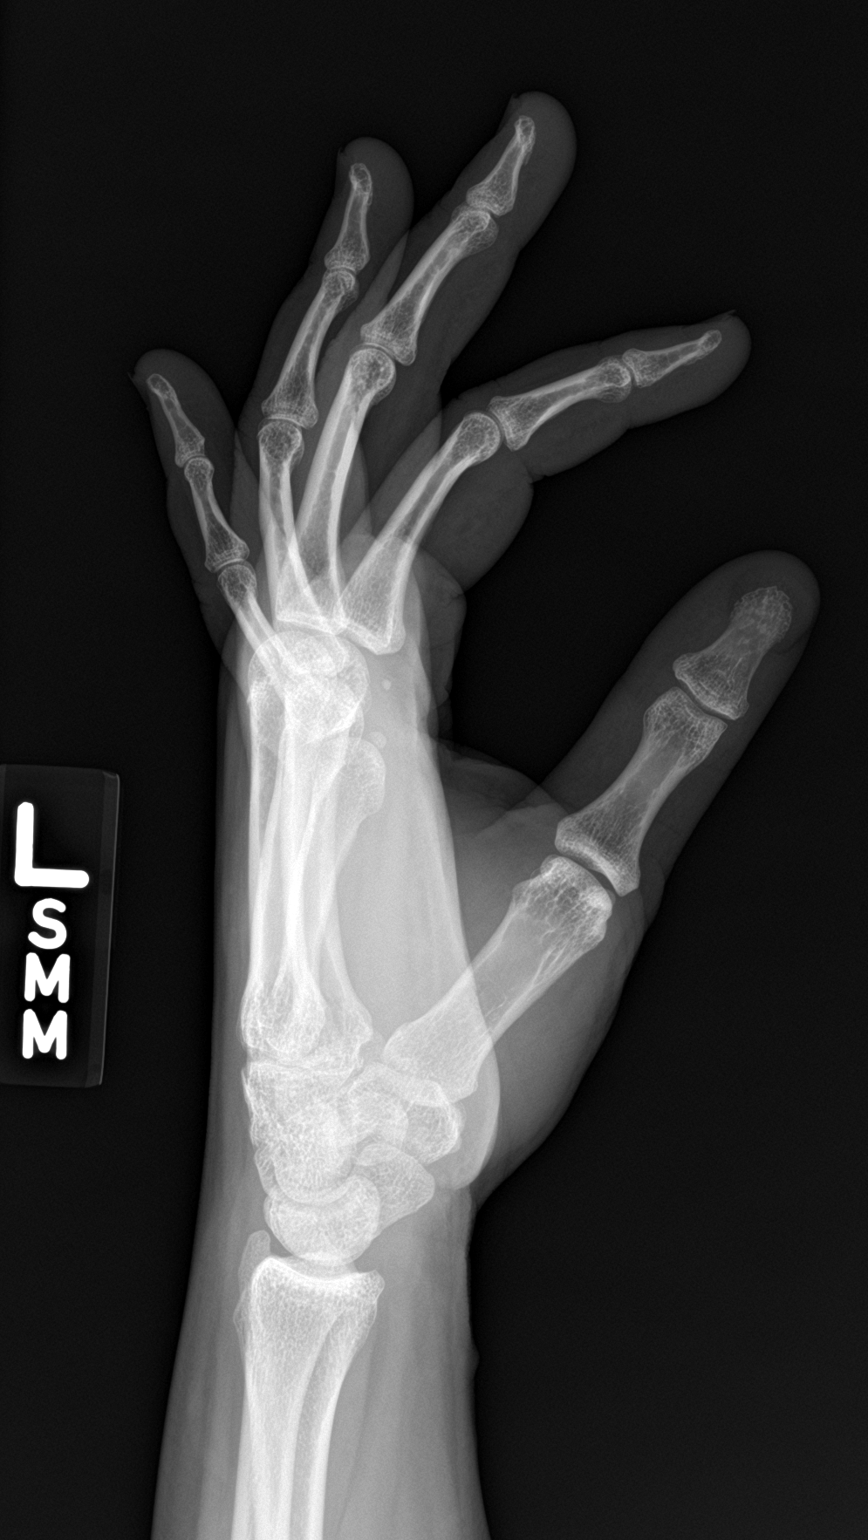

[3 of 3 positions shown; findings below may reference images not displayed]

FINDINGS: There is no evidence of fracture or dislocation. There is no
evidence of arthropathy or other focal bone abnormality. Soft
tissues are unremarkable.
IMPRESSION: Negative.

## 2017-10-14 NOTE — ED Provider Notes (Signed)
[place]   [id number] 10/14/17 Arrival Time: 1003  ASSESSMENT & PLAN:  1. Thumb injury, left, initial encounter    Possible gamekeeper's thumb.  Imaging: Dg Hand Complete Left  Result Date: 10/14/2017 CLINICAL DATA:  Softball injury. Hyperextension injury to left thumb and hand. EXAM: LEFT HAND - COMPLETE 3+ VIEW COMPARISON:  None. FINDINGS: There is no evidence of fracture or dislocation. There is no evidence of arthropathy or other focal bone abnormality. Soft tissues are unremarkable. IMPRESSION: Negative. Electronically Signed   By: Rolm Baptise M.D.   On: 10/14/2017 10:49    Follow-up Information    Schedule an appointment as soon as possible for a visit  with Charlotte Crumb, MD.   Specialty:  Orthopedic Surgery Contact information: [place] [address] [phone]          OTC analgesics as needed. Thumb spica splint.  Reviewed expectations re: course of current medical issues. Questions answered. Outlined signs and symptoms indicating need for more acute intervention. Patient verbalized understanding. After Visit Summary given.  SUBJECTIVE: History from: patient. Elizabeth Pope is a 37 y.o. female who reports persistent moderate pain of her left thumb that is stable; described as aching without radiation. Onset: abrupt, yesterday. Injury/trama: yes, reports her thumb caught another player's [place] while playing softball; 'bend my thumb all the way back'; immediate discomfort Relieved by: nothing in particular. Worsened by: certain movements. Associated symptoms: none reported. Extremity sensation changes or weakness: none. Self treatment: has not tried OTCs for relief of pain. History of similar: no She is R handed.  ROS: As per HPI.   OBJECTIVE:  Vitals:   10/14/17 1021  BP: 119/85  Pulse: 68  Resp: 18  Temp: 98.1 F (36.7 C)  TempSrc: Oral  SpO2: 100%    General appearance: alert; no  distress Extremities: warm and well perfused; symmetrical with no gross deformities; diffuse tenderness over her left thumb with mild swelling and no bruising; ROM: limited by pain; more pain proximally, esp with movement CV: brisk extremity capillary refill Skin: warm and dry Neurologic: normal gait; normal symmetric reflexes in all extremities; normal sensation in all extremities Psychological: alert and cooperative; normal mood and affect  Allergies  Allergen Reactions  . Fluoxetine     Muscle spasms Other reaction(s): Other (See Comments) Muscle spasms  . Humira [Adalimumab] Rash  . Hydrocodone-Acetaminophen Itching  . Hydroxychloroquine Diarrhea    diarrhea diarrhea  . Other Rash  . Vicodin [Hydrocodone-Acetaminophen] Itching    Past Medical History:  Diagnosis Date  . Anxiety   . Bipolar 1 disorder ([place])   . DDD (degenerative disc disease), cervical 12/25/2015  . Depression   . GERD (gastroesophageal reflux disease) 12/25/2015  . Hypertension 12/25/2015  . Migraine   . Migraines 12/25/2015  . 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/2015   mild  . Osteoarthritis of both hands 12/25/2015  . RA (rheumatoid arthritis) (H[facility]) 12/25/2015   Positive RF, Elevated ESR, Positive CCP > 250    Social History   Socioeconomic History  . Marital status: Single    Spouse name: Not on file  . Number of children: Not on file  . Years of education: Not on file  . Highest education level: Not on file  Occupational History  . Not on file  Social Needs  . Financial resource strain: Not on file  . Food insecurity:    Worry: Not on file    Inability: Not on file  . Transportation needs:  Medical: Not on file    Non-medical: Not on file  Tobacco Use  . Smoking status: Former Smoker    Packs/day: 0.50    Years: 19.00    Pack years: 9.50    Types: Cigarettes, E-cigarettes    Last attempt to quit: 01/24/2016    Years since quitting: 1.7  . Smokeless tobacco: Never Used   Substance and Sexual Activity  . Alcohol use: Yes    Alcohol/week: 0.0 standard drinks    Comment: occ  . Drug use: No  . Sexual activity: Yes    Birth control/protection: Surgical, IUD  Lifestyle  . Physical activity:    Days per week: Not on file    Minutes per session: Not on file  . Stress: Not on file  Relationships  . Social connections:    Talks on phone: Not on file    Gets together: Not on file    Attends religious service: Not on file    Active member of club or organization: Not on file    Attends meetings of clubs or organizations: Not on file    Relationship status: Not on file  Other Topics Concern  . Not on file  Social History Narrative  . Not on file   Family History  Problem Relation Age of Onset  . Rheum arthritis Mother   . Migraines Mother   . Hypertension Mother   . Colitis Mother   . Rheum arthritis Maternal Grandmother   . Rheum arthritis Maternal Aunt   . Migraines Sister   . Migraines Brother   . Sickle cell anemia Daughter   . Bipolar disorder Daughter   . Anxiety disorder Daughter   . Depression Daughter   . Migraines Daughter   . Arrhythmia Daughter   . Migraines Son   . ADD / ADHD Son   . Seizures Son   . Arrhythmia Son    Past Surgical History:  Procedure Laterality Date  . CESAREAN SECTION    . TUBAL LIGATION        Vanessa Kick, MD 10/15/17 1006

## 2017-10-14 NOTE — Telephone Encounter (Signed)
Called patient and left a message about her missing her appointment at 12:30 today.  Earlie Counts, PT @8 /21/2019@ 12:42 PM

## 2017-10-14 NOTE — ED Triage Notes (Signed)
Patient was playing softball yesterday.  Another player ran into patient as she caught the ball, left thumb was bent backwards

## 2017-10-14 NOTE — Discharge Instructions (Addendum)
You may use over the counter ibuprofen or acetaminophen as needed.  ° °

## 2017-10-22 ENCOUNTER — Ambulatory Visit: Payer: Medicaid Other | Admitting: Rheumatology

## 2017-11-05 MED FILL — ENBREL 50 MG/ML SURECLICK S: 50 | 28 days supply | Qty: 4 | Fill #1

## 2017-12-03 MED FILL — ENBREL SURECLICK 50 MG/ML S: 50 | 28 days supply | Qty: 4 | Fill #2

## 2017-12-09 ENCOUNTER — Telehealth: Payer: Self-pay | Admitting: Rheumatology

## 2017-12-09 MED ORDER — PREDNISONE 5 MG PO TABS: ORAL_TABLET | ORAL | 0 refills | Status: DC

## 2017-12-09 NOTE — Telephone Encounter (Signed)
Spoke with patient and advised her of information below. Patient verbalized understanding and states she is taking her medications as prescribed and on schedule. Patient states she will call our office if symptoms persists after finishing prednisone taper.

## 2017-12-09 NOTE — Telephone Encounter (Signed)
Patient states her ankles, feet and lower legs are swollen and painful. This has been going on for 2 days. Patient states she fell on Monday due to legs giving out. Patient is requesting a prednisone taper. Please advise.

## 2017-12-09 NOTE — Telephone Encounter (Signed)
Attempted to call patient and left message on machine to advise patient that a prednisone taper was being sent to the pharmacy. Advised patient to call the office if symptoms persist after finishing prednisone. Also asked patient to please give [place] a call back to ensure she is taking medications as prescribed.

## 2017-12-09 NOTE — Telephone Encounter (Signed)
Ok to provide prednisone taper starting at 20 mg and tapering by 5 mg every 2 days.  If she continues to have pain and swelling following the taper please advise patient to schedule a sooner follow up visit.  Please ensure patient is also taking medications as prescribed.

## 2017-12-09 NOTE — Telephone Encounter (Signed)
Patient called stating both of her ankles are swollen and is requesting a prescription of Prednisone to be sent to [facility].

## 2017-12-15 ENCOUNTER — Ambulatory Visit: Payer: Medicaid Other | Attending: Rheumatology

## 2017-12-27 ENCOUNTER — Other Ambulatory Visit: Payer: Self-pay | Admitting: Rheumatology

## 2017-12-28 ENCOUNTER — Other Ambulatory Visit: Payer: Self-pay | Admitting: Rheumatology

## 2017-12-28 NOTE — Telephone Encounter (Addendum)
Last visit: 09/28/2017 Next visit: 03/04/2018 Labs: 09/28/2017 CBC stable. CMP WNL  Patient advised she is due for labs. Patient will update 12/30/17.   Okay to refill 30 day supply per Dr. Estanislado Pandy

## 2017-12-29 NOTE — Telephone Encounter (Signed)
Last visit: 09/28/2017 Next visit: 03/04/2018 Labs: 8/5/2019CBC stable. CMP WNL TB Gold: 09/28/17 Neg   Patient advised she is due for labs. Patient will update 12/30/17.  Okay to refill 30 day supply per Dr. Estanislado Pandy

## 2017-12-31 MED FILL — ENBREL SURECLICK 50 MG/ML S: 50 | 28 days supply | Qty: 4 | Fill #0

## 2018-01-25 ENCOUNTER — Other Ambulatory Visit: Payer: Self-pay | Admitting: Rheumatology

## 2018-01-25 NOTE — Telephone Encounter (Signed)
Last visit: 09/28/2017 Next visit: 03/04/2018 Labs: 09/28/2017 CBC stable. CMP WNL. TB Gold: 09/28/2017 negative   Left message to advise patient she is due for labs.  Okay to refill 30 day supply Enbrel?

## 2018-01-25 NOTE — Telephone Encounter (Signed)
ok 

## 2018-01-28 MED FILL — ENBREL SURECLICK 50 MG/ML S: 50 | 28 days supply | Qty: 4 | Fill #0

## 2018-02-01 NOTE — Telephone Encounter (Signed)
Received a Prior Authorization request from [facility] for Enbrel. Authorization has been submitted to patient's insurance via Cover My Meds. Will update once we receive a response.

## 2018-02-02 NOTE — Telephone Encounter (Signed)
Received notification from [facility] regarding a prior authorization for Enbrel. Authorization has been APPROVED from 02/01/2018 to 02/01/2019.   Will send document to scan center.  Authorization # [id number]  10:12 AM Beatriz Chancellor, CPhT

## 2018-02-09 ENCOUNTER — Other Ambulatory Visit: Payer: Self-pay

## 2018-02-09 ENCOUNTER — Other Ambulatory Visit: Payer: Self-pay | Admitting: Rheumatology

## 2018-02-09 DIAGNOSIS — Z79899 Other long term (current) drug therapy: Secondary | ICD-10-CM

## 2018-02-09 NOTE — Telephone Encounter (Signed)
ok 

## 2018-02-09 NOTE — Telephone Encounter (Signed)
Last visit: 09/28/2017 Next visit: 03/04/2018 Labs: 8/5/2019CBC stable. CMP WNL  Left message to advise patient she is due for labs  Okay to refill 30 day supply MTX?

## 2018-02-10 LAB — CBC WITH DIFFERENTIAL/PLATELET
Absolute Monocytes: 382 cells/uL (ref 200–950)
BASOS ABS: 47 {cells}/uL (ref 0–200)
BASOS PCT: 0.7 %
EOS ABS: 563 {cells}/uL — AB (ref 15–500)
EOS PCT: 8.4 %
HEMATOCRIT: 42.3 % (ref 35.0–45.0)
HEMOGLOBIN: 14.9 g/dL (ref 11.7–15.5)
LYMPHS ABS: 2566 {cells}/uL (ref 850–3900)
MCH: 32 pg (ref 27.0–33.0)
MCHC: 35.2 g/dL (ref 32.0–36.0)
MCV: 90.8 fL (ref 80.0–100.0)
MONOS PCT: 5.7 %
MPV: 10.1 fL (ref 7.5–12.5)
NEUTROS ABS: 3142 {cells}/uL (ref 1500–7800)
Neutrophils Relative %: 46.9 %
Platelets: 352 10*3/uL (ref 140–400)
RBC: 4.66 10*6/uL (ref 3.80–5.10)
RDW: 15 % (ref 11.0–15.0)
Total Lymphocyte: 38.3 %
WBC: 6.7 10*3/uL (ref 3.8–10.8)

## 2018-02-10 LAB — COMPLETE METABOLIC PANEL WITH GFR
AG RATIO: 1.4 (calc) (ref 1.0–2.5)
ALBUMIN MSPROF: 4.4 g/dL (ref 3.6–5.1)
ALKALINE PHOSPHATASE (APISO): 55 U/L (ref 33–115)
ALT: 17 U/L (ref 6–29)
AST: 17 U/L (ref 10–30)
BILIRUBIN TOTAL: 0.5 mg/dL (ref 0.2–1.2)
BUN: 16 mg/dL (ref 7–25)
CHLORIDE: 107 mmol/L (ref 98–110)
CO2: 22 mmol/L (ref 20–32)
Calcium: 9.9 mg/dL (ref 8.6–10.2)
Creat: 0.88 mg/dL (ref 0.50–1.10)
GFR, Est African American: 97 mL/min/{1.73_m2} (ref >=60)
GFR, Est Non African American: 83 mL/min/{1.73_m2} (ref >=60)
GLOBULIN: 3.2 g/dL (ref 1.9–3.7)
Glucose, Bld: 77 mg/dL (ref 65–99)
POTASSIUM: 4.2 mmol/L (ref 3.5–5.3)
Sodium: 139 mmol/L (ref 135–146)
Total Protein: 7.6 g/dL (ref 6.1–8.1)

## 2018-02-19 NOTE — Progress Notes (Signed)
Office Visit Note  Patient: Elizabeth Pope             Date of Birth: 02-19-1981           MRN: [id number]             PCP: Kristie Cowman, MD Referring: Kristie Cowman, MD Visit Date: 03/04/2018 Occupation: @GUAROCC @  Subjective:  Pain in both knee joints    History of Present Illness: Elizabeth Pope is a 37 y.o. female with history of seropositive rheumatoid arthritis, DDD, and osteoarthritis. She has been injecting Enbrel 50 mg sq once weekly and taking MTX 8 tablets by mouth once weekly and folic acid 2 mg po daily.  She denies any recent flares.  She reports pain in both knee joints. She experiences pain in both knee joints at night. She has tried using voltaren gel in the past, which she does not feel is effective. She denies any joint swelling at this time.  She has morning stiffness lasting about 20 minutes every morning.  She reports she has chronic fatigue.  She denies needing any refills at this time.   Activities of Daily Living:  Patient reports morning stiffness for 20 minutes.   Patient Reports nocturnal pain.  Difficulty dressing/grooming: Denies Difficulty climbing stairs: Reports Difficulty getting out of chair: Reports Difficulty using hands for taps, buttons, cutlery, and/or writing: Reports  Review of Systems  Constitutional: Positive for fatigue.  HENT: Positive for mouth sores and mouth dryness. Negative for nose dryness.   Eyes: Positive for dryness. Negative for pain and visual disturbance.  Respiratory: Negative for cough, hemoptysis, shortness of breath and difficulty breathing.   Cardiovascular: Negative for chest pain, palpitations, hypertension and swelling in legs/feet.  Gastrointestinal: Negative for blood in stool, constipation and diarrhea.  Endocrine: Negative for increased urination.  Genitourinary: Negative for painful urination.  Musculoskeletal: Positive for arthralgias, joint pain and morning stiffness. Negative for joint  swelling, myalgias, muscle weakness, muscle tenderness and myalgias.  Skin: Negative for color change, pallor, rash, hair loss, nodules/bumps, skin tightness, ulcers and sensitivity to sunlight.  Allergic/Immunologic: Negative for susceptible to infections.  Neurological: Negative for dizziness, numbness, headaches and weakness.  Hematological: Negative for swollen glands.  Psychiatric/Behavioral: Positive for depressed mood and sleep disturbance. The patient is nervous/anxious.     PMFS History:  Patient Active Problem List   Diagnosis Date Noted  . Pelvic pain 06/02/2017  . Atypical squamous cell changes of undetermined significance (ASCUS) on cervical cytology with negative high risk human papilloma virus (HPV) test result 02/03/2017  . Encounter for IUD insertion 01/28/2017  . Uterine fibroid 01/21/2017  . H/O tubal ligation 06/24/2016  . High risk medication use 01/07/2016  . GERD (gastroesophageal reflux disease) 12/25/2015  . Migraines 12/25/2015  . Hypertension 12/25/2015  . RA (rheumatoid arthritis) ([place]) 12/25/2015  . DDD (degenerative disc disease), cervical 12/25/2015  . Osteoarthritis of both hands 12/25/2015  . 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/2015  . 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 Bipolar 2 disorder ([place]) 05/14/2015  . Cigarette smoker 05/05/2015  . Chest pain 05/05/2015  . Solitary pulmonary nodule 05/04/2015    Past Medical History:  Diagnosis Date  . Anxiety   . Bipolar 1 disorder ([place])   . DDD (degenerative disc disease), cervical 12/25/2015  . Depression   . GERD (gastroesophageal reflux disease) 12/25/2015  . Hypertension 12/25/2015  . Migraine   . Migraines 12/25/2015  . Osteoarthritis of both  feet 12/25/2015   mild  . Osteoarthritis of both hands 12/25/2015  . RA (rheumatoid arthritis) (HCC) 12/25/2015   Positive RF, Elevated ESR, Positive CCP > 250     Family History  Problem Relation Age of Onset    . Rheum arthritis Mother   . Migraines Mother   . Hypertension Mother   . Colitis Mother   . Rheum arthritis Maternal Grandmother   . Rheum arthritis Maternal Aunt   . Migraines Sister   . Migraines Brother   . Sickle cell anemia Daughter   . Bipolar disorder Daughter   . Anxiety disorder Daughter   . Depression Daughter   . Migraines Daughter   . Arrhythmia Daughter   . Migraines Son   . ADD / ADHD Son   . Seizures Son   . Arrhythmia Son    Past Surgical History:  Procedure Laterality Date  . CESAREAN SECTION    . TUBAL LIGATION     Social History   Social History Narrative  . Not on file    There is no immunization history on file for this patient.   Objective: Vital Signs: BP 131/87 (BP Location: Left Arm, Patient Position: Sitting, Cuff Size: Normal)   Pulse 72   Resp 13   Ht 5' 4"  (1.626 m)   Wt 164 lb 3.2 oz (74.5 kg)   BMI 28.18 kg/m    Physical Exam Vitals signs and nursing note reviewed.  Constitutional:      Appearance: She is well-developed.  HENT:     Head: Normocephalic and atraumatic.  Eyes:     Conjunctiva/sclera: Conjunctivae normal.  Neck:     Musculoskeletal: Normal range of motion.  Cardiovascular:     Rate and Rhythm: Normal rate and regular rhythm.     Heart sounds: Normal heart sounds.  Pulmonary:     Effort: Pulmonary effort is normal.     Breath sounds: Normal breath sounds.  Abdominal:     General: Bowel sounds are normal.     Palpations: Abdomen is soft.  Lymphadenopathy:     Cervical: No cervical adenopathy.  Skin:    General: Skin is warm and dry.     Capillary Refill: Capillary refill takes less than 2 seconds.  Neurological:     Mental Status: She is alert and oriented to person, place, and time.  Psychiatric:        Behavior: Behavior normal.      Musculoskeletal Exam: C-spine, thoracic spine, and lumbar spine good ROM.  No midline spinal tenderness.  No SI joint tenderness.  Right shoulder full ROM.  Left  shoulder limited ROM.  Elbow joints, wrist joints, MCPs, PIPs, and DIPs good ROM with no synovitis.  Complete fist formation bilaterally.  Hip joints, knee joints, ankle joints, MTPs, PIPs, and DIPs good ROM with no synovitis.  No warmth or effusion of knee joints.  No tenderness or swelling of ankle joints.  No tenderness over trochanteric bursa bilaterally.    CDAI Exam: CDAI Score: Not documented Patient Global Assessment: Not documented; Provider Global Assessment: Not documented Swollen: Not documented; Tender: Not documented Joint Exam   Not documented   There is currently no information documented on the homunculus. Go to the Rheumatology activity and complete the homunculus joint exam.  Investigation: No additional findings.  Imaging: No results found.  Recent Labs: Lab Results  Component Value Date   WBC 6.7 02/09/2018   HGB 14.9 02/09/2018   PLT 352 02/09/2018   NA  139 02/09/2018   K 4.2 02/09/2018   CL 107 02/09/2018   CO2 22 02/09/2018   GLUCOSE 77 02/09/2018   BUN 16 02/09/2018   CREATININE 0.88 02/09/2018   BILITOT 0.5 02/09/2018   ALKPHOS 49 09/09/2016   AST 17 02/09/2018   ALT 17 02/09/2018   PROT 7.6 02/09/2018   ALBUMIN 4.1 09/09/2016   CALCIUM 9.9 02/09/2018   GFRAA 97 02/09/2018   QFTBGOLDPLUS NEGATIVE 09/28/2017    Speciality Comments: PLQ Eye Exam: WNL 03/24/17 @ [facility] Prior therapy: Humira (facial rash)  Procedures:  No procedures performed Allergies: Fluoxetine; Humira [adalimumab]; Hydrocodone-acetaminophen; Hydroxychloroquine; Other; and Vicodin [hydrocodone-acetaminophen]     Assessment / Plan:     Visit Diagnoses: Rheumatoid arthritis involving multiple sites with positive rheumatoid factor (HCC) - +CCP, +RF: She has no synovitis on exam.  She is clinically doing well on enbrel 50 mg sq injections every week, MTX 8 tablets by mouth once weekly, and folic acid 2 mg po daily.  She has not had any recent rheumatoid arthritis flares.  She has not missed any doses of these medications recently.  She continues to have chronic pain in both knee joints but no warmth or effusion was noted.  X-rays of both knee joints were obtained on 09/28/17 that revealed mild osteoarthritis.  She has tried using voltaren gel in the past, which she did not feel was effective. She will continue on Enbrel 50 mg sq weekly injections and MTX 8 tablets by mouth once weekly.  She does not need any refills at this time.  We will check sed rate and CCP with her next lab work in March 2020.  Future orders were placed today.  She was advised to notify us if she develops increased joint pain or joint swelling.  She will follow up in 5 months.   - Plan: Cyclic citrul peptide antibody, IgG, Sedimentation rate  High risk medication use - Enbrel and MTX 8 tablets by mouth once week and folic acid 2 mg daily(Humira caused facial rash)Enbrel and methotrexate.  Last TB gold negative on 09/28/2017.  Most recent CBC/CMP stable on 02/09/2018 and will monitor every 3 months for drug toxicity.  Standing orders are in place.   Primary osteoarthritis of both hands: She has no tenderness or synovitis on exam.  She has complete fist formation bilaterally.  Joint protection and muscle strengthening were discussed.   Primary osteoarthritis of both knees: Chondromalacia of both patellae-No warmth or effusion.  She has good ROM with no discomfort.  She continues to have difficulty climbing steps and getting up from a chair.  She experiences pain in both knee joints at night.  She has tried using voltaren gel in the past but she does not feel as though it is effective. XRs were obtaine on 09/28/17 that revealed mild OA.   Primary osteoarthritis of both feet: She has no discomfort in her feet at this time.  She wears proper fitting shoes.   Chronic midline low back pain without sciatica: XR of the lumbar spine was obtained on 09/28/17 that was unremarkable.   DDD (degenerative disc disease),  cervical: She has good ROM with no discomfort.  She has no symptoms of radiculopathy at this time.   Other medical conditions are listed as follows:   Essential hypertension  Solitary pulmonary nodule  Bipolar 2 disorder ([place])   Orders: Orders Placed This Encounter  Procedures  . Cyclic citrul peptide antibody, IgG  . Sedimentation rate   No  orders of the defined types were placed in this encounter.     Follow-Up Instructions: Return in about 5 months (around 08/03/2018) for Rheumatoid arthritis, Osteoarthritis, DDD.   Ofilia Neas, PA-C   I examined and evaluated the patient with Hazel Sams PA.  He had no synovitis on my examination today.  She continues to have some discomfort from underlying osteoarthritis and disc disease.  We will continue current treatment for right now.  The plan of care was discussed as noted above.  Bo Merino, MD  Note - This record has been created using Editor, commissioning.  Chart creation errors have been sought, but may not always  have been located. Such creation errors do not reflect on  the standard of medical care.

## 2018-03-01 ENCOUNTER — Other Ambulatory Visit: Payer: Self-pay | Admitting: Rheumatology

## 2018-03-02 MED FILL — ENBREL SURECLICK 50 MG/ML S: 50 | 28 days supply | Qty: 4 | Fill #0

## 2018-03-02 NOTE — Telephone Encounter (Signed)
Last visit: 09/28/17 Next visit: 03/04/18 Labs: 02/09/18 CMP WNL. CBC stable. TB gold: 09/28/17 Neg   Okay to refill per Dr. Estanislado Pandy

## 2018-03-04 ENCOUNTER — Encounter: Payer: Self-pay | Admitting: Rheumatology

## 2018-03-04 ENCOUNTER — Ambulatory Visit: Payer: Medicaid Other | Admitting: Rheumatology

## 2018-03-04 VITALS — BP 131/87 | HR 72 | Resp 13 | Ht 64.0 in | Wt 164.2 lb

## 2018-03-04 DIAGNOSIS — F3181 Bipolar II disorder: Secondary | ICD-10-CM

## 2018-03-04 DIAGNOSIS — M19071 Primary osteoarthritis, right ankle and foot: Secondary | ICD-10-CM

## 2018-03-04 DIAGNOSIS — M19072 Primary osteoarthritis, left ankle and foot: Secondary | ICD-10-CM

## 2018-03-04 DIAGNOSIS — M19042 Primary osteoarthritis, left hand: Secondary | ICD-10-CM

## 2018-03-04 DIAGNOSIS — M545 Low back pain, unspecified: Secondary | ICD-10-CM

## 2018-03-04 DIAGNOSIS — G8929 Other chronic pain: Secondary | ICD-10-CM

## 2018-03-04 DIAGNOSIS — Z79899 Other long term (current) drug therapy: Secondary | ICD-10-CM

## 2018-03-04 DIAGNOSIS — M2242 Chondromalacia patellae, left knee: Secondary | ICD-10-CM

## 2018-03-04 DIAGNOSIS — M0579 Rheumatoid arthritis with rheumatoid factor of multiple sites without organ or systems involvement: Secondary | ICD-10-CM

## 2018-03-04 DIAGNOSIS — R911 Solitary pulmonary nodule: Secondary | ICD-10-CM

## 2018-03-04 DIAGNOSIS — M2241 Chondromalacia patellae, right knee: Secondary | ICD-10-CM

## 2018-03-04 DIAGNOSIS — M170 Bilateral primary osteoarthritis of knee: Secondary | ICD-10-CM

## 2018-03-04 DIAGNOSIS — M19041 Primary osteoarthritis, right hand: Secondary | ICD-10-CM | POA: Diagnosis not present

## 2018-03-04 DIAGNOSIS — M5030 Other cervical disc degeneration, unspecified cervical region: Secondary | ICD-10-CM

## 2018-03-04 DIAGNOSIS — I10 Essential (primary) hypertension: Secondary | ICD-10-CM

## 2018-03-04 NOTE — Patient Instructions (Addendum)
Standing Labs We placed an order today for your standing lab work.    Please come back and get your standing labs in March and every 3 months   We have open lab Monday through Friday from 8:30-11:30 AM and 1:30-4:00 PM  at the office of Dr. Bo Merino.   You may experience shorter wait times on Monday and Friday afternoons. The office is located at [address] [place], [place], [place] [address] No appointment is necessary.   Labs are drawn by [facility].  You may receive a bill from [facility] for your lab work.  If you wish to have your labs drawn at another location, please call the office 24 hours in advance to send orders.  If you have any questions regarding directions or hours of operation,  please call [phone].   Just as a reminder please drink plenty of water prior to coming for your lab work. Thanks!   Knee Exercises              Ask your health care provider which exercises are safe for you. Do exercises exactly as told by your health care provider and adjust them as directed. It is normal to feel mild stretching, pulling, tightness, or discomfort as you do these exercises, but you should stop right away if you feel sudden pain or your pain gets worse.Do not begin these exercises until told by your health care provider. STRETCHING AND RANGE OF MOTION EXERCISES These exercises warm up your muscles and joints and improve the movement and flexibility of your knee. These exercises also help to relieve pain, numbness, and tingling. Exercise A: Knee Extension, Prone 1. Lie on your abdomen on a bed. 2. Place your left / right knee just beyond the edge of the surface so your knee is not on the bed. You can put a towel under your left / right thigh just above your knee for comfort. 3. Relax your leg muscles and allow gravity to straighten your knee. You should feel a stretch behind your left / right knee. 4. Hold this position for __________ seconds. 5. Scoot up so  your knee is supported between repetitions. Repeat __________ times. Complete this stretch __________ times a day. Exercise B: Knee Flexion, Active 1. Lie on your back with both knees straight. If this causes back discomfort, bend your left / right knee so your foot is flat on the floor. 2. Slowly slide your left / right heel back toward your buttocks until you feel a gentle stretch in the front of your knee or thigh. 3. Hold this position for __________ seconds. 4. Slowly slide your left / right heel back to the starting position. Repeat __________ times. Complete this exercise __________ times a day. Exercise C: Quadriceps, Prone 1. Lie on your abdomen on a firm surface, such as a bed or padded floor. 2. Bend your left / right knee and hold your ankle. If you cannot reach your ankle or pant leg, loop a belt around your foot and grab the belt instead. 3. Gently pull your heel toward your buttocks. Your knee should not slide out to the side. You should feel a stretch in the front of your thigh and knee. 4. Hold this position for __________ seconds. Repeat __________ times. Complete this stretch __________ times a day. Exercise D: Hamstring, Supine 1. Lie on your back. 2. Loop a belt or towel over the ball of your left / right foot. The ball of your foot is on the  walking surface, right under your toes. 3. Straighten your left / right knee and slowly pull on the belt to raise your leg until you feel a gentle stretch behind your knee. ? Do not let your left / right knee bend while you do this. ? Keep your other leg flat on the floor. 4. Hold this position for __________ seconds. Repeat __________ times. Complete this stretch __________ times a day. STRENGTHENING EXERCISES These exercises build strength and endurance in your knee. Endurance is the ability to use your muscles for a long time, even after they get tired. Exercise E: Quadriceps, Isometric 1. Lie on your back with your left / right  leg extended and your other knee bent. Put a rolled towel or small pillow under your knee if told by your health care provider. 2. Slowly tense the muscles in the front of your left / right thigh. You should see your kneecap slide up toward your hip or see increased dimpling just above the knee. This motion will push the back of the knee toward the floor. 3. For __________ seconds, keep the muscle as tight as you can without increasing your pain. 4. Relax the muscles slowly and completely. Repeat __________ times. Complete this exercise __________ times a day. Exercise F: Straight Leg Raises - Quadriceps 1. Lie on your back with your left / right leg extended and your other knee bent. 2. Tense the muscles in the front of your left / right thigh. You should see your kneecap slide up or see increased dimpling just above the knee. Your thigh may even shake a bit. 3. Keep these muscles tight as you raise your leg 4-6 inches (10-15 cm) off the floor. Do not let your knee bend. 4. Hold this position for __________ seconds. 5. Keep these muscles tense as you lower your leg. 6. Relax your muscles slowly and completely after each repetition. Repeat __________ times. Complete this exercise __________ times a day. Exercise G: Hamstring, Isometric 1. Lie on your back on a firm surface. 2. Bend your left / right knee approximately __________ degrees. 3. Dig your left / right heel into the surface as if you are trying to pull it toward your buttocks. Tighten the muscles in the back of your thighs to dig as hard as you can without increasing any pain. 4. Hold this position for __________ seconds. 5. Release the tension gradually and allow your muscles to relax completely for __________ seconds after each repetition. Repeat __________ times. Complete this exercise __________ times a day. Exercise H: Hamstring Curls If told by your health care provider, do this exercise while wearing ankle weights. Begin with  __________ weights. Then increase the weight by 1 lb (0.5 kg) increments. Do not wear ankle weights that are more than __________. 1. Lie on your abdomen with your legs straight. 2. Bend your left / right knee as far as you can without feeling pain. Keep your hips flat against the floor. 3. Hold this position for __________ seconds. 4. Slowly lower your leg to the starting position. Repeat __________ times. Complete this exercise __________ times a day. Exercise I: Squats (Quadriceps) 1. Stand in front of a table, with your feet and knees pointing straight ahead. You may rest your hands on the table for balance but not for support. 2. Slowly bend your knees and lower your hips like you are going to sit in a chair. ? Keep your weight over your heels, not over your toes. ? Keep your lower  legs upright so they are parallel with the table legs. ? Do not let your hips go lower than your knees. ? Do not bend lower than told by your health care provider. ? If your knee pain increases, do not bend as low. 3. Hold the squat position for __________ seconds. 4. Slowly push with your legs to return to standing. Do not use your hands to pull yourself to standing. Repeat __________ times. Complete this exercise __________ times a day. Exercise J: Wall Slides (Quadriceps) 1. Lean your back against a smooth wall or door while you walk your feet out 18-24 inches (46-61 cm) from it. 2. Place your feet hip-width apart. 3. Slowly slide down the wall or door until your knees bend __________ degrees. Keep your knees over your heels, not over your toes. Keep your knees in line with your hips. 4. Hold for __________ seconds. Repeat __________ times. Complete this exercise __________ times a day. Exercise K: Straight Leg Raises - Hip Abductors 1. Lie on your side with your left / right leg in the top position. Lie so your head, shoulder, knee, and hip line up. You may bend your bottom knee to help you keep your  balance. 2. Roll your hips slightly forward so your hips are stacked directly over each other and your left / right knee is facing forward. 3. Leading with your heel, lift your top leg 4-6 inches (10-15 cm). You should feel the muscles in your outer hip lifting. ? Do not let your foot drift forward. ? Do not let your knee roll toward the ceiling. 4. Hold this position for __________ seconds. 5. Slowly return your leg to the starting position. 6. Let your muscles relax completely after each repetition. Repeat __________ times. Complete this exercise __________ times a day. Exercise L: Straight Leg Raises - Hip Extensors 1. Lie on your abdomen on a firm surface. You can put a pillow under your hips if that is more comfortable. 2. Tense the muscles in your buttocks and lift your left / right leg about 4-6 inches (10-15 cm). Keep your knee straight as you lift your leg. 3. Hold this position for __________ seconds. 4. Slowly lower your leg to the starting position. 5. Let your leg relax completely after each repetition. Repeat __________ times. Complete this exercise __________ times a day. This information is not intended to replace advice given to you by your health care provider. Make sure you discuss any questions you have with your health care provider. Document Released: 12/25/2004 Document Revised: 11/05/2015 Document Reviewed: 12/17/2014 Elsevier Interactive Patient Education  2019 [employer].

## 2018-03-12 ENCOUNTER — Ambulatory Visit: Payer: Medicaid Other | Admitting: Advanced Practice Midwife

## 2018-03-12 ENCOUNTER — Encounter: Payer: Self-pay | Admitting: Advanced Practice Midwife

## 2018-03-12 ENCOUNTER — Other Ambulatory Visit (HOSPITAL_COMMUNITY)
Admission: RE | Admit: 2018-03-12 | Discharge: 2018-03-12 | Disposition: A | Discharge status: Home / Self Care | Payer: Medicaid Other | Source: Ambulatory Visit | Attending: Advanced Practice Midwife | Admitting: Advanced Practice Midwife

## 2018-03-12 VITALS — BP 131/86 | HR 73 | Wt 162.0 lb

## 2018-03-12 DIAGNOSIS — Z124 Encounter for screening for malignant neoplasm of cervix: Secondary | ICD-10-CM | POA: Insufficient documentation

## 2018-03-12 DIAGNOSIS — Z01419 Encounter for gynecological examination (general) (routine) without abnormal findings: Secondary | ICD-10-CM

## 2018-03-12 NOTE — Progress Notes (Signed)
Patient presents for Annual Exam  CC: NONE    LMP: 02/26/2018 Contraception:IUD  STD Screening: Desires Full panel  Last pap; 01/28/18 ASCUS +HPV

## 2018-03-12 NOTE — Progress Notes (Signed)
GYNECOLOGY ANNUAL PREVENTATIVE CARE ENCOUNTER NOTE  Subjective:   Elizabeth Pope is a 38 y.o. [phone] female here for a routine annual gynecologic exam.  Current complaints: None.    Denies abnormal vaginal bleeding, discharge, pelvic pain, problems with intercourse or other gynecologic concerns. Patient requests STI screening.   Gynecologic History Patient's last menstrual period was 02/26/2018 (approximate). Contraception: IUD Last Pap: 01/28/2017. Results were: abnormal ASCUS with positive HPV Last mammogram: N/A.   Obstetric History OB History  Gravida Para Term Preterm AB Living  _0 SAB TAB Ectopic Multiple Live Births  1       3    # Outcome Date GA Lbr Len/2nd Weight Sex Delivery Anes PTL Lv  4 Term 07/23/01    F CS-LTranv     3 Term 03/21/00    M Vag-Spont   LIV  2 Term 11/02/96    F Vag-Spont   FD  1 SAB 06/03/95            Past Medical History:  Diagnosis Date  . Anxiety   . Bipolar 1 disorder ([place])   . DDD (degenerative disc disease), cervical 12/25/2015  . Depression   . GERD (gastroesophageal reflux disease) 12/25/2015  . Hypertension 12/25/2015  . Migraine   . Migraines 12/25/2015  . 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/2015   mild  . Osteoarthritis of both hands 12/25/2015  . RA (rheumatoid arthritis) ([place]) 12/25/2015   Positive RF, Elevated ESR, Positive CCP > 250     Past Surgical History:  Procedure Laterality Date  . CESAREAN SECTION    . TUBAL LIGATION      Current Outpatient Medications on File Prior to Visit  Medication Sig Dispense Refill  . amLODipine (NORVASC) 5 MG tablet Take 1 tablet by mouth daily. Reported on 08/27/2015  5  . cetirizine (ZYRTEC) 10 MG tablet TK 1 T PO D PRN  0  . clindamycin-benzoyl peroxide (BENZACLIN) gel APPLY A THIN LAYER OVER THE ENTIRE FACE ONCE A DAY IN THE MORNING  1  . ENBREL SURECLICK 50 MG/ML injection INJECT 0.98 MLS (50 MG TOTAL) INTO THE SKIN ONCE A WEEK. 12 Syringe 0  . folic acid  (FOLVITE) 1 MG tablet TAKE 2 TABLETS BY MOUTH EVERY DAY 180 tablet 0  . hydrOXYzine (ATARAX/VISTARIL) 25 MG tablet Take 25 mg by mouth 3 (three) times daily as needed.    . lamoTRIgine (LAMICTAL) 100 MG tablet Take 100 mg daily by mouth.    . methotrexate (RHEUMATREX) 2.5 MG tablet TAKE 8 TABLETS BY MOUTH ONCE A WEEK- PROTECT FROM LIGHT 32 tablet 0  . montelukast (SINGULAIR) 10 MG tablet Take 10 mg by mouth at bedtime.    Marland Kitchen RETIN-A 0.025 % cream Apply 1 application daily topically.  2  . sertraline (ZOLOFT) 100 MG tablet Take 100 mg daily by mouth.  1  . traZODone (DESYREL) 50 MG tablet Take 100 mg by mouth at bedtime as needed.   1  . triamcinolone cream (KENALOG) 0.1 % Apply 1 application topically as needed.     . diazepam (VALIUM) 10 MG tablet INSERT  1 T QHS INTO VAGINA UTD  0   No current facility-administered medications on file prior to visit.     Allergies  Allergen Reactions  . Fluoxetine     Muscle spasms Other reaction(s): Other (See Comments) Muscle spasms  . Humira [Adalimumab] Rash  . Hydrocodone-Acetaminophen Itching  . Hydroxychloroquine Diarrhea  diarrhea diarrhea  . Other Rash  . Vicodin [Hydrocodone-Acetaminophen] Itching    Social History:  reports that she has been smoking cigarettes and e-cigarettes. She has a 9.50 pack-year smoking history. She has never used smokeless tobacco. She reports current alcohol use. She reports that she does not use drugs.  Family History  Problem Relation Age of Onset  . Rheum arthritis Mother   . Migraines Mother   . Hypertension Mother   . Colitis Mother   . Rheum arthritis Maternal Grandmother   . Rheum arthritis Maternal Aunt   . Migraines Sister   . Migraines Brother   . Sickle cell anemia Daughter   . Bipolar disorder Daughter   . Anxiety disorder Daughter   . Depression Daughter   . Migraines Daughter   . Arrhythmia Daughter   . Migraines Son   . ADD / ADHD Son   . Seizures Son   . Arrhythmia Son      The following portions of the patient's history were reviewed and updated as appropriate: allergies, current medications, past family history, past medical history, past social history, past surgical history and problem list.  Review of Systems Pertinent items noted in HPI and remainder of comprehensive ROS otherwise negative.   Objective:  BP 131/86   Pulse 73   Wt 162 lb (73.5 kg)   LMP 02/26/2018 (Approximate)   BMI 27.81 kg/m  CONSTITUTIONAL: Well-developed, well-nourished female in no acute distress.  HENT:  Normocephalic, atraumatic, External right and left ear normal. Oropharynx is clear and moist EYES: Conjunctivae and EOM are normal. Pupils are equal, round, and reactive to light. No scleral icterus.  NECK: Normal range of motion, supple, no masses.  Normal thyroid.  SKIN: Skin is warm and dry. No rash noted. Not diaphoretic. No erythema. No pallor. MUSCULOSKELETAL: Normal range of motion. No tenderness.  No cyanosis, clubbing, or edema.  2+ distal pulses. NEUROLOGIC: Alert and oriented to person, place, and time. Normal reflexes, muscle tone coordination. No cranial nerve deficit noted. PSYCHIATRIC: Normal mood and affect. Normal behavior. Normal judgment and thought content. CARDIOVASCULAR: Normal heart rate noted, regular rhythm RESPIRATORY: Clear to auscultation bilaterally. Effort and breath sounds normal, no problems with respiration noted. BREASTS: Symmetric in size. No masses, skin changes, nipple drainage, or lymphadenopathy. ABDOMEN: Soft, normal bowel sounds, no distention noted.  No tenderness, rebound or guarding.  PELVIC: Normal appearing external genitalia; normal appearing vaginal mucosa and cervix.  Thick white vaginal discharge noted, slightly foul smell.  Pap smear obtained.  Normal uterine size, no other palpable masses, no uterine or adnexal tenderness. IUD strings visualized    Assessment and Plan:  1. Well woman exam with routine gynecological  exam - Reviewed advice for physical activity and smoking cessation - Mirena IUD placed 01/28/2017 - Hepatitis B surface antigen - Hepatitis C antibody - RPR - HIV Antibody (routine testing w rflx) - IGP, Aptima HPV, rfx 16/18,45  Will follow up results of pap smear and manage accordingly. Routine preventative health maintenance measures emphasized. Please refer to After Visit Summary for other counseling recommendations.    Mallie Snooks, [place] [facility] [employer], [place]

## 2018-03-12 NOTE — Patient Instructions (Signed)
Preventive Care 18-39 Years, Female Preventive care refers to lifestyle choices and visits with your health care provider that can promote health and wellness. What does preventive care include?   A yearly physical exam. This is also called an annual well check.  Dental exams once or twice a year.  Routine eye exams. Ask your health care provider how often you should have your eyes checked.  Personal lifestyle choices, including: ? Daily care of your teeth and gums. ? Regular physical activity. ? Eating a healthy diet. ? Avoiding tobacco and drug use. ? Limiting alcohol use. ? Practicing safe sex. ? Taking vitamin and mineral supplements as recommended by your health care provider. What happens during an annual well check? The services and screenings done by your health care provider during your annual well check will depend on your age, overall health, lifestyle risk factors, and family history of disease. Counseling Your health care provider may ask you questions about your:  Alcohol use.  Tobacco use.  Drug use.  Emotional well-being.  Home and relationship well-being.  Sexual activity.  Eating habits.  Work and work environment.  Method of birth control.  Menstrual cycle.  Pregnancy history. Screening You may have the following tests or measurements:  Height, weight, and BMI.  Diabetes screening. This is done by checking your blood sugar (glucose) after you have not eaten for a while (fasting).  Blood pressure.  Lipid and cholesterol levels. These may be checked every 5 years starting at age 20.  Skin check.  Hepatitis C blood test.  Hepatitis B blood test.  Sexually transmitted disease (STD) testing.  BRCA-related cancer screening. This may be done if you have a family history of breast, ovarian, tubal, or peritoneal cancers.  Pelvic exam and Pap test. This may be done every 3 years starting at age 21. Starting at age 30, this may be done every 5  years if you have a Pap test in combination with an HPV test. Discuss your test results, treatment options, and if necessary, the need for more tests with your health care provider. Vaccines Your health care provider may recommend certain vaccines, such as:  Influenza vaccine. This is recommended every year.  Tetanus, diphtheria, and acellular pertussis (Tdap, Td) vaccine. You may need a Td booster every 10 years.  Varicella vaccine. You may need this if you have not been vaccinated.  HPV vaccine. If you are 26 or younger, you may need three doses over 6 months.  Measles, mumps, and rubella (MMR) vaccine. You may need at least one dose of MMR. You may also need a second dose.  Pneumococcal 13-valent conjugate (PCV13) vaccine. You may need this if you have certain conditions and were not previously vaccinated.  Pneumococcal polysaccharide (PPSV23) vaccine. You may need one or two doses if you smoke cigarettes or if you have certain conditions.  Meningococcal vaccine. One dose is recommended if you are age 19-21 years and a first-year college student living in a residence hall, or if you have one of several medical conditions. You may also need additional booster doses.  Hepatitis A vaccine. You may need this if you have certain conditions or if you travel or work in places where you may be exposed to hepatitis A.  Hepatitis B vaccine. You may need this if you have certain conditions or if you travel or work in places where you may be exposed to hepatitis B.  Haemophilus influenzae type b (Hib) vaccine. You may need this if you   have certain risk factors. Talk to your health care provider about which screenings and vaccines you need and how often you need them. This information is not intended to replace advice given to you by your health care provider. Make sure you discuss any questions you have with your health care provider. Document Released: 04/08/2001 Document Revised: 09/23/2016  Document Reviewed: 12/12/2014 Elsevier Interactive Patient Education  2019 [employer].

## 2018-03-13 LAB — RPR: RPR Ser Ql: NONREACTIVE

## 2018-03-13 LAB — HIV ANTIBODY (ROUTINE TESTING W REFLEX): HIV SCREEN 4TH GENERATION: NONREACTIVE

## 2018-03-13 LAB — HEPATITIS C ANTIBODY

## 2018-03-13 LAB — HEPATITIS B SURFACE ANTIGEN: HEP B S AG: NEGATIVE

## 2018-03-18 ENCOUNTER — Encounter: Payer: Self-pay | Admitting: Advanced Practice Midwife

## 2018-03-18 DIAGNOSIS — B977 Papillomavirus as the cause of diseases classified elsewhere: Secondary | ICD-10-CM | POA: Insufficient documentation

## 2018-03-18 LAB — CYTOLOGY - PAP
DIAGNOSIS: NEGATIVE
HPV (WINDOPATH): DETECTED — AB
HPV 16/18/45 genotyping: NEGATIVE

## 2018-03-24 ENCOUNTER — Other Ambulatory Visit: Payer: Self-pay | Admitting: Rheumatology

## 2018-03-25 NOTE — Telephone Encounter (Signed)
Last Visit: 03/04/18 Next Visit: 07/27/18 Labs: 02/09/18 CMP WNL. CBC stable.  Okay to refill per Dr. Estanislado Pandy

## 2018-04-05 ENCOUNTER — Telehealth: Payer: Self-pay | Admitting: Pharmacy Technician

## 2018-04-05 NOTE — Telephone Encounter (Signed)
Received a message form the pharmacy that they have been unable to schedule shipment with patient. Left message for patient.

## 2018-04-06 NOTE — Telephone Encounter (Signed)
Pharmacy has been unable to reach patient to schedule Enbrel refill.

## 2018-04-07 NOTE — Telephone Encounter (Signed)
Attempted to contact the patient and left message for patient to call the office.  

## 2018-04-09 NOTE — Telephone Encounter (Signed)
Was able to reach patient.  Informed her pharmacy has been trying to reach out to schedule shipment of Enbrel.  She states she forgot and will call them this afternoon to schedule.

## 2018-04-14 NOTE — Telephone Encounter (Signed)
Patient did not call to schedule refill. Called patient, left message.

## 2018-04-20 NOTE — Telephone Encounter (Signed)
Left message for patient again. Still has not scheduled Enbrel shipment.

## 2018-04-30 NOTE — Telephone Encounter (Signed)
Left message for patient. Pharmacy has not been able to reach patient for refill.  Patient last filled Enbrel on 03/02/2018.

## 2018-05-07 NOTE — Telephone Encounter (Signed)
Called patient to follow up, was able to reach patient. She stated she was still taking her Enbrel and really needed a refill. Warm transferred her to pharmacy to schedule shipment and to complete [facility] Clinical.

## 2018-05-10 MED FILL — ENBREL SURECLICK 50 MG/ML S: 50 | 28 days supply | Qty: 4 | Fill #1

## 2018-05-20 ENCOUNTER — Other Ambulatory Visit: Payer: Self-pay | Admitting: Rheumatology

## 2018-05-20 NOTE — Telephone Encounter (Signed)
Last Visit: 03/04/2018 Next Visit: 07/27/2018  Okay to refill per Dr. Estanislado Pandy.

## 2018-06-06 ENCOUNTER — Other Ambulatory Visit: Payer: Self-pay | Admitting: Rheumatology

## 2018-06-07 NOTE — Telephone Encounter (Signed)
Last Visit: 03/04/2018 Next Visit: 07/27/2018 Labs: 02/09/2018 CMP WNL. CBC stable.  attempted to contact patient and left message on machine to advise patient she is due to update labs. Advised patient we are not drawing labs in the office, she will have to go to [facility] or [facility].   Okay to refill per Dr. Estanislado Pandy.

## 2018-06-23 ENCOUNTER — Telehealth: Payer: Self-pay | Admitting: Pharmacist

## 2018-06-23 MED FILL — ENBREL SURECLICK 50 MG/ML S: 50 | 28 days supply | Qty: 4 | Fill #2

## 2018-06-23 NOTE — Telephone Encounter (Signed)
Notified by [facility] that patient reported missing her last two injections as she took them out to warm to room temperature and forgot about them.  She also mentioned that she missed due to cost but patient has Medicaid with co-pay 22f 3 dollars which is often waived if unable to afford.  Enbrel is good out of the fridge for up to 14 days.  Patient knows to call our office with questions or concerns.  Also knows we have samples for emergency situations.  She did not contact the office. Patient has history of non-compliance.    Will follow up at next office visit in June.  All questions encouraged and answered.  Instructed patient to call with any further questions or concerns.  Mariella Saa, PharmD, [facility] Rheumatology Clinical Pharmacist  06/23/2018 3:46 PM

## 2018-06-24 NOTE — Telephone Encounter (Signed)
Thank you for informing us.  We will further address this at her follow up visit on 07/27/18.

## 2018-07-13 NOTE — Progress Notes (Signed)
Office Visit Note  Patient: Elizabeth Pope             Date of Birth: 1980-04-28           MRN: [id number]             PCP: Kristie Cowman, MD Referring: Kristie Cowman, MD Visit Date: 07/27/2018 Occupation: @GUAROCC @  Subjective:  Pain in multiple joints     History of Present Illness: Elizabeth Pope is a 38 y.o. female with history of seropositive rheumatoid arthritis, osteoarthritis, and DDD.  She is prescribed Enbrel 50 mg sq weekly injections, MTX 8 tablets po once weekly, and folic acid 2 mg po daily.  She denies missing any doses recently. She reports increased pain in both hands, both feet, and the right knee joint.  She has swelling in both hands and feet in the morning.  She has intermittent bilateral ankle joint pain.   Activities of Daily Living:  Patient reports morning stiffness for 30-45 minutes.   Patient Reports nocturnal pain.  Difficulty dressing/grooming: Denies Difficulty climbing stairs: Reports Difficulty getting out of chair: Reports Difficulty using hands for taps, buttons, cutlery, and/or writing: Reports  Review of Systems  Constitutional: Positive for fatigue.  HENT: Positive for mouth dryness. Negative for mouth sores and nose dryness.   Eyes: Negative for pain, visual disturbance and dryness.  Respiratory: Positive for cough. Negative for hemoptysis, shortness of breath and difficulty breathing.   Cardiovascular: Negative for chest pain, palpitations, hypertension and swelling in legs/feet.  Gastrointestinal: Positive for diarrhea. Negative for blood in stool and constipation.  Endocrine: Negative for increased urination.  Genitourinary: Negative for painful urination.  Musculoskeletal: Positive for arthralgias, joint pain, joint swelling and morning stiffness. Negative for myalgias, muscle weakness, muscle tenderness and myalgias.  Skin: Negative for color change, pallor, rash, hair loss, nodules/bumps, skin tightness, ulcers and  sensitivity to sunlight.  Allergic/Immunologic: Negative for susceptible to infections.  Neurological: Negative for dizziness, numbness, headaches and weakness.  Hematological: Negative for swollen glands.  Psychiatric/Behavioral: Positive for depressed mood and sleep disturbance. The patient is nervous/anxious.     PMFS History:  Patient Active Problem List   Diagnosis Date Noted  . 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 Pelvic pain 06/02/2017  . Atypical squamous cell changes of undetermined significance (ASCUS) on cervical cytology with negative high risk human papilloma virus (HPV) test result 02/03/2017  . Encounter for IUD insertion 01/28/2017  . Uterine fibroid 01/21/2017  . H/O tubal ligation 06/24/2016  . High risk medication use 01/07/2016  . GERD (gastroesophageal reflux disease) 12/25/2015  . Migraines 12/25/2015  . Hypertension 12/25/2015  . RA (rheumatoid arthritis) ([place]) 12/25/2015  . DDD (degenerative disc disease), cervical 12/25/2015  . Osteoarthritis of both hands 12/25/2015  . 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/2015  . 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 Bipolar 2 disorder ([place]) 05/14/2015  . Cigarette smoker 05/05/2015  . Chest pain 05/05/2015  . Solitary pulmonary nodule 05/04/2015    Past Medical History:  Diagnosis Date  . Anxiety   . Bipolar 1 disorder ([place])   . DDD (degenerative disc disease), cervical 12/25/2015  . Depression   . GERD (gastroesophageal reflux disease) 12/25/2015  . Hypertension 12/25/2015  . Migraine   . Migraines 12/25/2015  . 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/2015   mild  . Osteoarthritis of both hands 12/25/2015  . RA (rheumatoid arthritis) (HCC) 12/25/2015   Positive RF, Elevated ESR, Positive  CCP > 250     Family History  Problem Relation Age of Onset  . Rheum arthritis Mother   . Migraines Mother   . Hypertension Mother   . Colitis Mother   . Rheum arthritis  Maternal Grandmother   . Rheum arthritis Maternal Aunt   . Migraines Sister   . Migraines Brother   . Sickle cell anemia Daughter   . Bipolar disorder Daughter   . Anxiety disorder Daughter   . Depression Daughter   . Migraines Daughter   . Arrhythmia Daughter   . Migraines Son   . ADD / ADHD Son   . Seizures Son   . Arrhythmia Son    Past Surgical History:  Procedure Laterality Date  . CESAREAN SECTION    . TUBAL LIGATION     Social History   Social History Narrative  . Not on file    There is no immunization history on file for this patient.   Objective: Vital Signs: BP (!) 141/98 (BP Location: Left Arm, Patient Position: Sitting, Cuff Size: Normal)   Pulse 84   Resp 13   Ht 5' 4"  (1.626 m)   Wt 165 lb (74.8 kg)   BMI 28.32 kg/m    Physical Exam Vitals signs and nursing note reviewed.  Constitutional:      Appearance: She is well-developed.  HENT:     Head: Normocephalic and atraumatic.  Eyes:     Conjunctiva/sclera: Conjunctivae normal.  Neck:     Musculoskeletal: Normal range of motion.  Cardiovascular:     Rate and Rhythm: Normal rate and regular rhythm.     Heart sounds: Normal heart sounds.  Pulmonary:     Effort: Pulmonary effort is normal.     Breath sounds: Normal breath sounds.  Abdominal:     General: Bowel sounds are normal.     Palpations: Abdomen is soft.  Lymphadenopathy:     Cervical: No cervical adenopathy.  Skin:    General: Skin is warm and dry.     Capillary Refill: Capillary refill takes less than 2 seconds.  Neurological:     Mental Status: She is alert and oriented to person, place, and time.  Psychiatric:        Behavior: Behavior normal.      Musculoskeletal Exam: C-spine, thoracic spine, and lumbar spine good ROM.  No midline spinal tenderness.  No SI joint tenderness.  Shoulder joints, elbow joints, wrist joints, MCPs, PIPs, and DIPs good ROM with no synovitis.  Tenderness of the right 4th MCP joint.  Hip joints good ROM.   Right knee has painful flexion and extension.  No warmth or effusion of knee joints noted.  Ankle joints, MTPs, PIPs, and DIPs good ROM with no synovitis.  No tenderness or inflammation of ankle joints noted.  No tenderness over MTP joints.    CDAI Exam: CDAI Score: 3.2  Patient Global Assessment: 6 (mm); Provider Global Assessment: 6 (mm) Swollen: 0 ; Tender: 2  Joint Exam      Right  Left  PIP 4   Tender     Knee   Tender        Investigation: No additional findings.  Imaging: No results found.  Recent Labs: Lab Results  Component Value Date   WBC 6.7 02/09/2018   HGB 14.9 02/09/2018   PLT 352 02/09/2018   NA 139 02/09/2018   K 4.2 02/09/2018   CL 107 02/09/2018   CO2 22 02/09/2018   GLUCOSE 77  02/09/2018   BUN 16 02/09/2018   CREATININE 0.88 02/09/2018   BILITOT 0.5 02/09/2018   ALKPHOS 49 09/09/2016   AST 17 02/09/2018   ALT 17 02/09/2018   PROT 7.6 02/09/2018   ALBUMIN 4.1 09/09/2016   CALCIUM 9.9 02/09/2018   GFRAA 97 02/09/2018   QFTBGOLDPLUS NEGATIVE 09/28/2017    Speciality Comments: PLQ Eye Exam: WNL 03/24/17 @ [facility] Prior therapy: Humira (facial rash)  Procedures:  No procedures performed Allergies: Fluoxetine; Humira [adalimumab]; Hydrocodone-acetaminophen; Hydroxychloroquine; Other; and Vicodin [hydrocodone-acetaminophen]   Assessment / Plan:     Visit Diagnoses: Rheumatoid arthritis involving multiple sites with positive rheumatoid factor (HCC) - +CCP, +RF: She presents today with pain in both hands, both feet, and the right knee joint.  She has noticed intermittent joint swelling in both hands and both feet.  She has tenderness of the right 4th MCP joint.  She has difficulty with flexion and extension of the right knee joint due to discomfort today.  No warmth or effusion noted.  She has been noncompliant taking MTX and Enbrel in the past. She missed several doses in April 2020.  She will continue on Enbrel 50 mg sq weekly injections and MTX  8 tablets po once weekly.  She is overdue for lab work.  Once labs have resulted we will refill Enbrel.  She requested a prednisone taper.  She will take prednisone 20 mg and reduce by 5 mg every 4 days.  She will follow up in 5 months.  High risk medication use - Enbrel 50 mg sq weekly injections and MTX 8 tablets by mouth once week and folic acid 2 mg daily(Humira caused facial rash)She is non-compliant with Enbrel according to the pharmacy and labs.  Last TB gold negative on 09/28/2017 and will monitor yearly.  Most recent CBC stable and CMP within normal limits on 02/09/2018.  She is overdue for labs.  CBC/CMP ordered for today and will monitor every 3 months.  Standing orders are in place. - Plan: CBC with Differential/Platelet, COMPLETE METABOLIC PANEL WITH GFR  Primary osteoarthritis of both hands: She has been having increased discomfort and stiffness in both hands.  She has tenderness of the right 4th MCP joint.  Joint protection and muscle strengthening were discussed.   Primary osteoarthritis of both knees: She presents today with right knee joint pain.  She has painful ROM of the right knee joint.  No warmth or effusion noted on exam.  She has good ROM of the left knee joint with no discomfort.    Chondromalacia of both patellae  Primary osteoarthritis of both feet: She has been having increased pain in both feet. She has no tenderness of MTP and PIP joints.  She has good ROM of both ankle joints with no warmth or effusion.  We discussed the importance of wearing proper fitting shoes.   DDD (degenerative disc disease), cervical: She has good ROM with no discomfort.  She has no symptoms of radiculopathy at this time.   Chronic midline low back pain without sciatica: She has no lower back pain at this time.   Other medical conditions are listed as follows:   Solitary pulmonary nodule  Bipolar 2 disorder (HCC)  Essential hypertension   Orders: Orders Placed This Encounter  Procedures   . CBC with Differential/Platelet  . COMPLETE METABOLIC PANEL WITH GFR   Meds ordered this encounter  Medications  . predniSONE (DELTASONE) 5 MG tablet    Sig: Take 4 tablets by mouth daily  x2 days, 3 tablets by mouth daily x2 days, 2 tablets by mouth daily x2 days, 1 tablet by mouth daily x2 days.    Dispense:  20 tablet    Refill:  0    Face-to-face time spent with patient was 30 minutes. Greater than 50% of time was spent in counseling and coordination of care.  Follow-Up Instructions: Return in about 5 months (around 12/27/2018) for Rheumatoid arthritis.   Ofilia Neas, PA-C   I examined and evaluated the patient with Hazel Sams PA.  Patient is having a mild flare with pain and discomfort in her hands as described above.  She had tenderness on palpation over right fourth MCP joint on my examination.  She was given a prednisone taper.  She will take her medications on a regular basis now.  The plan of care was discussed as noted above.  Bo Merino, MD  Note - This record has been created using Editor, commissioning.  Chart creation errors have been sought, but may not always  have been located. Such creation errors do not reflect on  the standard of medical care.

## 2018-07-27 ENCOUNTER — Ambulatory Visit: Payer: Medicaid Other | Admitting: Rheumatology

## 2018-07-27 ENCOUNTER — Encounter: Payer: Self-pay | Admitting: Rheumatology

## 2018-07-27 ENCOUNTER — Other Ambulatory Visit: Payer: Self-pay

## 2018-07-27 VITALS — BP 141/98 | HR 84 | Resp 13 | Ht 64.0 in | Wt 165.0 lb

## 2018-07-27 DIAGNOSIS — M5030 Other cervical disc degeneration, unspecified cervical region: Secondary | ICD-10-CM

## 2018-07-27 DIAGNOSIS — M545 Low back pain, unspecified: Secondary | ICD-10-CM

## 2018-07-27 DIAGNOSIS — M19071 Primary osteoarthritis, right ankle and foot: Secondary | ICD-10-CM

## 2018-07-27 DIAGNOSIS — I10 Essential (primary) hypertension: Secondary | ICD-10-CM

## 2018-07-27 DIAGNOSIS — Z79899 Other long term (current) drug therapy: Secondary | ICD-10-CM | POA: Diagnosis not present

## 2018-07-27 DIAGNOSIS — M170 Bilateral primary osteoarthritis of knee: Secondary | ICD-10-CM | POA: Diagnosis not present

## 2018-07-27 DIAGNOSIS — M2241 Chondromalacia patellae, right knee: Secondary | ICD-10-CM

## 2018-07-27 DIAGNOSIS — R911 Solitary pulmonary nodule: Secondary | ICD-10-CM

## 2018-07-27 DIAGNOSIS — M19041 Primary osteoarthritis, right hand: Secondary | ICD-10-CM

## 2018-07-27 DIAGNOSIS — M19072 Primary osteoarthritis, left ankle and foot: Secondary | ICD-10-CM

## 2018-07-27 DIAGNOSIS — M0579 Rheumatoid arthritis with rheumatoid factor of multiple sites without organ or systems involvement: Secondary | ICD-10-CM

## 2018-07-27 DIAGNOSIS — F3181 Bipolar II disorder: Secondary | ICD-10-CM

## 2018-07-27 DIAGNOSIS — M2242 Chondromalacia patellae, left knee: Secondary | ICD-10-CM

## 2018-07-27 DIAGNOSIS — M19042 Primary osteoarthritis, left hand: Secondary | ICD-10-CM

## 2018-07-27 DIAGNOSIS — G8929 Other chronic pain: Secondary | ICD-10-CM

## 2018-07-27 MED ORDER — PREDNISONE 5 MG PO TABS: ORAL_TABLET | ORAL | 0 refills | Status: DC

## 2018-07-27 NOTE — Patient Instructions (Signed)
Standing Labs We placed an order today for your standing lab work.    Please come back and get your standing labs in September and every 3 months  We have open lab daily Monday through Thursday from 8:30-12:30 PM and 1:30-4:30 PM and Friday from 8:30-12:30 PM and 1:30 -4:00 PM at the office of Dr. Kynadee Dam.   You may experience shorter wait times on Monday and Friday afternoons. The office is located at [address], [address], [place], [place] [address] No appointment is necessary.   Labs are drawn by [facility].  You may receive a bill from [facility] for your lab work.  If you wish to have your labs drawn at another location, please call the office 24 hours in advance to send orders.  If you have any questions regarding directions or hours of operation,  please call [phone].   Just as a reminder please drink plenty of water prior to coming for your lab work. Thanks!  

## 2018-07-28 ENCOUNTER — Other Ambulatory Visit: Payer: Self-pay | Admitting: Rheumatology

## 2018-08-02 ENCOUNTER — Other Ambulatory Visit: Payer: Self-pay | Admitting: *Deleted

## 2018-08-02 MED ORDER — ETANERCEPT 50 MG/ML ~~LOC~~ SOAJ: SUBCUTANEOUS | 0 refills | Status: DC

## 2018-08-02 MED FILL — ENBREL SURECLICK 50 MG/ML S: 50 | 28 days supply | Qty: 4 | Fill #0

## 2018-08-02 NOTE — Telephone Encounter (Signed)
Last Visit: 07/27/18 Next Visit: 10/28/18 Labs: 07/29/18 MCH 31.9 RDW 15.3 Sodium 132 CO2 19 TB Gold: 09/28/17 Neg   Okay to refill per Dr. Estanislado Pandy

## 2018-09-01 MED FILL — ENBREL SURECLICK 50 MG/ML S: 50 | 28 days supply | Qty: 4 | Fill #1

## 2018-09-12 ENCOUNTER — Ambulatory Visit (HOSPITAL_COMMUNITY)
Admission: EM | Admit: 2018-09-12 | Discharge: 2018-09-12 | Disposition: A | Discharge status: Home / Self Care | Payer: Medicaid Other | Attending: Family Medicine | Admitting: Family Medicine

## 2018-09-12 ENCOUNTER — Encounter (HOSPITAL_COMMUNITY): Payer: Self-pay | Admitting: *Deleted

## 2018-09-12 ENCOUNTER — Other Ambulatory Visit: Payer: Self-pay

## 2018-09-12 DIAGNOSIS — J45901 Unspecified asthma with (acute) exacerbation: Secondary | ICD-10-CM

## 2018-09-12 DIAGNOSIS — J4541 Moderate persistent asthma with (acute) exacerbation: Secondary | ICD-10-CM | POA: Diagnosis not present

## 2018-09-12 MED ORDER — ALBUTEROL SULFATE HFA 108 (90 BASE) MCG/ACT IN AERS
INHALATION_SPRAY | RESPIRATORY_TRACT | Status: AC
  Filled 2018-09-12: qty 6.7

## 2018-09-12 MED ORDER — ALBUTEROL SULFATE HFA 108 (90 BASE) MCG/ACT IN AERS: 1.0000 | INHALATION_SPRAY | Freq: Four times a day (QID) | RESPIRATORY_TRACT | 0 refills | Status: DC | PRN

## 2018-09-12 MED ORDER — ALBUTEROL SULFATE HFA 108 (90 BASE) MCG/ACT IN AERS
2.0000 | INHALATION_SPRAY | Freq: Once | RESPIRATORY_TRACT | Status: AC
  Administered 2018-09-12: 13:00:00 2 via RESPIRATORY_TRACT

## 2018-09-12 MED ORDER — PREDNISONE 20 MG PO TABS: 20.0000 mg | ORAL_TABLET | Freq: Two times a day (BID) | ORAL | 0 refills | Status: DC

## 2018-09-12 NOTE — ED Provider Notes (Signed)
[facility]    CSN: [id number] Arrival date & time: 09/12/18  1040     History   Chief Complaint Chief Complaint  Patient presents with  . Wheezing  . Cough    HPI Elizabeth Pope is a 38 y.o. female.   HPI  Patient with multiple medical problems including rheumatoid arthritis.  She has known asthma.  She takes Singulair daily.  She usually has albuterol to take as needed.  She noticed she was becoming more shortness of breath about 5 days ago.  She called her doctor's office twice and was unable to get through to get a refill on her inhaler.  She is here today because she does not feel like she can wait until tomorrow.  She had difficulty sleeping last night with her shortness of breath.  Mild cough.  Postnasal drip.  No fever or chills.  No body aches.  She feels quite certain she has not been exposed to COVID-19 and does not desire testing  Past Medical History:  Diagnosis Date  . Anxiety   . Bipolar 1 disorder ([place])   . DDD (degenerative disc disease), cervical 12/25/2015  . Depression   . GERD (gastroesophageal reflux disease) 12/25/2015  . Hypertension 12/25/2015  . Migraine   . Migraines 12/25/2015  . 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/2015   mild  . Osteoarthritis of both hands 12/25/2015  . RA (rheumatoid arthritis) (HCC) 12/25/2015   Positive RF, Elevated ESR, Positive CCP > 250     Patient Active Problem List   Diagnosis Date Noted  . Asthma with acute exacerbation 09/12/2018  . 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 Pelvic pain 06/02/2017  . Atypical squamous cell changes of undetermined significance (ASCUS) on cervical cytology with negative high risk human papilloma virus (HPV) test result 02/03/2017  . Encounter for IUD insertion 01/28/2017  . Uterine fibroid 01/21/2017  . H/O tubal ligation 06/24/2016  . High risk medication use 01/07/2016  . GERD (gastroesophageal reflux disease) 12/25/2015  . Migraines 12/25/2015  .  Hypertension 12/25/2015  . RA (rheumatoid arthritis) ([place]) 12/25/2015  . DDD (degenerative disc disease), cervical 12/25/2015  . Osteoarthritis of both hands 12/25/2015  . 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/2015  . 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 Bipolar 2 disorder ([place]) 05/14/2015  . Cigarette smoker 05/05/2015  . Chest pain 05/05/2015  . Solitary pulmonary nodule 05/04/2015    Past Surgical History:  Procedure Laterality Date  . CESAREAN SECTION    . TUBAL LIGATION      OB History    Gravida  4   Para  3   Term  3   Preterm      AB  1   Living  3     SAB  1   TAB      Ectopic      Multiple      Live Births  3            Home Medications    Prior to Admission medications   Medication Sig Start Date End Date Taking? Authorizing Provider  amLODipine (NORVASC) 5 MG tablet Take 1 tablet by mouth daily. Reported on 08/27/2015 04/21/15  Yes [provider]  cetirizine (ZYRTEC) 10 MG tablet TK 1 T PO D PRN 06/12/16  Yes [provider]  diazepam (VALIUM) 10 MG tablet INSERT  1 T QHS INTO VAGINA UTD 07/07/17  Yes [provider]  etanercept (ENBREL SURECLICK) 50 MG/ML injection INJECT 0.98 MLS (50 MG TOTAL) INTO THE SKIN ONCE A WEEK. 08/02/18  Yes Deveshwar, Abel Presto, MD  folic acid (FOLVITE) 1 MG tablet TAKE 2 TABLETS BY MOUTH EVERY DAY 05/20/18  Yes Deveshwar, Abel Presto, MD  hydrOXYzine (ATARAX/VISTARIL) 25 MG tablet Take 25 mg by mouth 3 (three) times daily as needed.   Yes [provider]  lamoTRIgine (LAMICTAL) 100 MG tablet Take 100 mg daily by mouth. 03/18/16  Yes [provider]  methotrexate (RHEUMATREX) 2.5 MG tablet TAKE 8 TABLETS BY MOUTH ONCE A WEEK...PROTECT FROM LIGHT 06/07/18  Yes Deveshwar, Abel Presto, MD  montelukast (SINGULAIR) 10 MG tablet Take 10 mg by mouth at bedtime.   Yes [provider]  sertraline (ZOLOFT) 100 MG tablet Take 100 mg daily by mouth. 12/16/16   Yes [provider]  traZODone (DESYREL) 50 MG tablet Take 100 mg by mouth at bedtime as needed.  04/11/15  Yes [provider]  albuterol (VENTOLIN HFA) 108 (90 Base) MCG/ACT inhaler Inhale 1-2 puffs into the lungs every 6 (six) hours as needed for wheezing or shortness of breath. 09/12/18   Raylene Everts, MD  predniSONE (DELTASONE) 20 MG tablet Take 1 tablet (20 mg total) by mouth 2 (two) times daily with a meal. 09/12/18   Raylene Everts, MD  triamcinolone cream (KENALOG) 0.1 % Apply 1 application topically as needed.  11/13/16   [provider]    Family History Family History  Problem Relation Age of Onset  . Rheum arthritis Mother   . Migraines Mother   . Hypertension Mother   . Colitis Mother   . Rheum arthritis Maternal Grandmother   . Rheum arthritis Maternal Aunt   . Migraines Sister   . Migraines Brother   . Sickle cell anemia Daughter   . Bipolar disorder Daughter   . Anxiety disorder Daughter   . Depression Daughter   . Migraines Daughter   . Arrhythmia Daughter   . Migraines Son   . ADD / ADHD Son   . Seizures Son   . Arrhythmia Son     Social History Social History   Tobacco Use  . Smoking status: Current Some Day Smoker    Packs/day: 0.50    Years: 19.00    Pack years: 9.50    Types: Cigars  . Smokeless tobacco: Never Used  . Tobacco comment: former cigarette smoker  Substance Use Topics  . Alcohol use: Yes    Alcohol/week: 0.0 standard drinks    Comment: occasional  . Drug use: No     Allergies   Fluoxetine, Humira [adalimumab], Hydrocodone-acetaminophen, Hydroxychloroquine, Other, and Vicodin [hydrocodone-acetaminophen]   Review of Systems Review of Systems  Constitutional: Negative for chills and fever.  HENT: Positive for postnasal drip. Negative for ear pain and sore throat.   Eyes: Negative for pain and visual disturbance.  Respiratory: Positive for cough, chest tightness, shortness of breath and wheezing.    Cardiovascular: Negative for chest pain and palpitations.  Gastrointestinal: Negative for abdominal pain and vomiting.  Genitourinary: Negative for dysuria and hematuria.  Musculoskeletal: Negative for arthralgias and back pain.  Skin: Negative for color change and rash.  Neurological: Negative for seizures and syncope.  All other systems reviewed and are negative.    Physical Exam Triage Vital Signs ED Triage Vitals  Enc Vitals Group     BP 09/12/18 1203 (!) 174/107     Pulse Rate 09/12/18 1203 73     Resp  09/12/18 1203 16     Temp 09/12/18 1203 98.1 F (36.7 C)     Temp Source 09/12/18 1203 Oral     SpO2 09/12/18 1203 100 %     Weight --      Height --      Head Circumference --      Peak Flow --      Pain Score 09/12/18 1205 0     Pain Loc --      Pain Edu? --      Excl. in [place]? --    No data found.  Updated Vital Signs BP (!) 174/107 (BP Location: Left Arm)   Pulse 73   Temp 98.1 F (36.7 C) (Oral)   Resp 16   SpO2 100% Did not take blood pressure medication this morning    Physical Exam Constitutional:      General: She is not in acute distress.    Appearance: She is well-developed.  HENT:     Head: Normocephalic and atraumatic.  Eyes:     Conjunctiva/sclera: Conjunctivae normal.     Pupils: Pupils are equal, round, and reactive to light.  Neck:     Musculoskeletal: Normal range of motion.  Cardiovascular:     Rate and Rhythm: Normal rate and regular rhythm.     Heart sounds: Normal heart sounds.  Pulmonary:     Effort: No respiratory distress.     Breath sounds: Wheezing present.     Comments: Patient tachypneic with conversation.  Bilateral wheezes. Abdominal:     General: There is no distension.     Palpations: Abdomen is soft.  Musculoskeletal: Normal range of motion.  Skin:    General: Skin is warm and dry.  Neurological:     Mental Status: She is alert.      UC Treatments / Results  Labs (all labs ordered are listed, but only  abnormal results are displayed) Labs Reviewed - No data to display  EKG   Radiology No results found.  Procedures Procedures (including critical care time)  Medications Ordered in UC Medications  albuterol (VENTOLIN HFA) 108 (90 Base) MCG/ACT inhaler 2 puff (2 puffs Inhalation Given 09/12/18 1231)  albuterol (VENTOLIN HFA) 108 (90 Base) MCG/ACT inhaler (has no administration in time range)    Initial Impression / Assessment and Plan / UC Course  I have reviewed the triage vital signs and the nursing notes.  Pertinent labs & imaging results that were available during my care of the patient were reviewed by me and considered in my medical decision making (see chart for details).     Asthma flare.  Discussed home care.  Reasons for return Final Clinical Impressions(s) / UC Diagnoses   Final diagnoses:  Moderate persistent asthma with exacerbation     Discharge Instructions     Albuterol is refilled Take the prednisone as directed Take 2 doses today Follow up with your PCP    ED Prescriptions    Medication Sig Dispense Auth. Provider   predniSONE (DELTASONE) 20 MG tablet Take 1 tablet (20 mg total) by mouth 2 (two) times daily with a meal. 10 tablet Raylene Everts, MD   albuterol (VENTOLIN HFA) 108 (90 Base) MCG/ACT inhaler Inhale 1-2 puffs into the lungs every 6 (six) hours as needed for wheezing or shortness of breath. 18 g Raylene Everts, MD     Controlled Substance Prescriptions [place] Controlled Substance Registry consulted? Not Applicable   Raylene Everts, MD 09/12/18 1240

## 2018-09-12 NOTE — Discharge Instructions (Signed)
Albuterol is refilled Take the prednisone as directed Take 2 doses today Follow up with your PCP

## 2018-09-12 NOTE — ED Triage Notes (Addendum)
C/O starting with asthma flare-up approx 5 days ago; PCP has not called in her normal inhaler.  C/O slight cough, feeling wheezing and "rattling".  Denies fevers.

## 2018-09-30 MED FILL — ENBREL SURECLICK 50 MG/ML S: 50 | 28 days supply | Qty: 4 | Fill #2

## 2018-10-14 NOTE — Progress Notes (Signed)
Office Visit Note  Patient: Elizabeth Pope             Date of Birth: 04/14/1980           MRN: [id number]             PCP: Kristie Cowman, MD Referring: Kristie Cowman, MD Visit Date: 10/28/2018 Occupation: @GUAROCC @  Subjective:  Pain in both hands   History of Present Illness: Elizabeth Pope is a 38 y.o. female with history of seropositive rheumatoid arthritis and osteoarthritis. She is on Enbrel 50 mg sq weekly injections, MTX 8 tablets po once weekly, and folic acid 2 mg po daily.  She has not missed any doses recently.  She reports that she has been having increased pain and multiple joints.  She states that she has recently started working at a desk job which has been causing increased pain and stiffness in both hands.  She has been typing on a daily basis.  She also experiences stiffness in her feet after sitting for prolonged periods of time.  She has occasional knee joint pain but denies any joint swelling.  She does not take any over-the-counter products for pain relief.   Activities of Daily Living:  Patient reports morning stiffness for 20 minutes.   Patient Reports nocturnal pain.  Difficulty dressing/grooming: Reports Difficulty climbing stairs: Reports Difficulty getting out of chair: Reports Difficulty using hands for taps, buttons, cutlery, and/or writing: Reports  Review of Systems  Constitutional: Positive for fatigue.  HENT: Positive for mouth dryness. Negative for mouth sores and nose dryness.   Eyes: Positive for dryness. Negative for pain and visual disturbance.  Respiratory: Negative for cough, hemoptysis, shortness of breath and difficulty breathing.   Cardiovascular: Positive for swelling in legs/feet. Negative for chest pain, palpitations and hypertension.  Gastrointestinal: Positive for diarrhea. Negative for blood in stool and constipation.  Genitourinary: Negative for difficulty urinating and painful urination.  Musculoskeletal: Positive  for arthralgias, joint pain, joint swelling, morning stiffness and muscle tenderness. Negative for myalgias, muscle weakness and myalgias.  Skin: Negative for color change, pallor, rash, hair loss, nodules/bumps, skin tightness, ulcers and sensitivity to sunlight.  Allergic/Immunologic: Negative for susceptible to infections.  Neurological: Negative for dizziness, headaches and weakness.  Hematological: Negative for bruising/bleeding tendency and swollen glands.  Psychiatric/Behavioral: Positive for sleep disturbance. Negative for depressed mood. The patient is not nervous/anxious.     PMFS History:  Patient Active Problem List   Diagnosis Date Noted  . Asthma with acute exacerbation 09/12/2018  . 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 Pelvic pain 06/02/2017  . Atypical squamous cell changes of undetermined significance (ASCUS) on cervical cytology with negative high risk human papilloma virus (HPV) test result 02/03/2017  . Encounter for IUD insertion 01/28/2017  . Uterine fibroid 01/21/2017  . H/O tubal ligation 06/24/2016  . High risk medication use 01/07/2016  . GERD (gastroesophageal reflux disease) 12/25/2015  . Migraines 12/25/2015  . Hypertension 12/25/2015  . RA (rheumatoid arthritis) ([place]) 12/25/2015  . DDD (degenerative disc disease), cervical 12/25/2015  . Osteoarthritis of both hands 12/25/2015  . 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/2015  . 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 Bipolar 2 disorder ([place]) 05/14/2015  . Cigarette smoker 05/05/2015  . Chest pain 05/05/2015  . Solitary pulmonary nodule 05/04/2015    Past Medical History:  Diagnosis Date  . Anxiety   . Bipolar 1 disorder ([place])   . DDD (degenerative disc  disease), cervical 12/25/2015  . Depression   . GERD (gastroesophageal reflux disease) 12/25/2015  . Hypertension 12/25/2015  . Migraine   . Migraines 12/25/2015  . 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/2015    mild  . Osteoarthritis of both hands 12/25/2015  . RA (rheumatoid arthritis) (HCC) 12/25/2015   Positive RF, Elevated ESR, Positive CCP > 250     Family History  Problem Relation Age of Onset  . Rheum arthritis Mother   . Migraines Mother   . Hypertension Mother   . Colitis Mother   . Rheum arthritis Maternal Grandmother   . Rheum arthritis Maternal Aunt   . Migraines Sister   . Migraines Brother   . Sickle cell anemia Daughter   . Bipolar disorder Daughter   . Anxiety disorder Daughter   . Depression Daughter   . Migraines Daughter   . Arrhythmia Daughter   . Migraines Son   . ADD / ADHD Son   . Seizures Son   . Arrhythmia Son    Past Surgical History:  Procedure Laterality Date  . CESAREAN SECTION    . TUBAL LIGATION     Social History   Social History Narrative  . Not on file    There is no immunization history on file for this patient.   Objective: Vital Signs: BP (!) 139/95 (BP Location: Left Arm, Patient Position: Sitting, Cuff Size: Normal)   Pulse 80   Resp 14   Ht 5' 2.5" (1.588 m)   Wt 162 lb 12.8 oz (73.8 kg)   BMI 29.30 kg/m    Physical Exam Vitals signs and nursing note reviewed.  Constitutional:      Appearance: She is well-developed.  HENT:     Head: Normocephalic and atraumatic.  Eyes:     Conjunctiva/sclera: Conjunctivae normal.  Neck:     Musculoskeletal: Normal range of motion.  Cardiovascular:     Rate and Rhythm: Normal rate and regular rhythm.     Heart sounds: Normal heart sounds.  Pulmonary:     Effort: Pulmonary effort is normal.     Breath sounds: Normal breath sounds.  Abdominal:     General: Bowel sounds are normal.     Palpations: Abdomen is soft.  Lymphadenopathy:     Cervical: No cervical adenopathy.  Skin:    General: Skin is warm and dry.     Capillary Refill: Capillary refill takes less than 2 seconds.  Neurological:     Mental Status: She is alert and oriented to person, place, and time.  Psychiatric:         Behavior: Behavior normal.      Musculoskeletal Exam: C-spine, thoracic spine, and lumbar spine good ROM.  No midline spinal tenderness.  No SI joint tenderness.  Shoulder joints, elbow joints, wrist joints, MCPs, PIPs, DIPs good ROM with no synovitis.  Tenderness of both wrist joints.  Tenderness of right 1st MCP joint.  Hip joints, knee joints, ankle joints, MTPs, PIPs, and DIPs good ROM with no synovitis.  No warmth or effusion of knee joints.  No tenderness or swelling of ankle joints.   CDAI Exam: CDAI Score: 3.8  Patient Global: 4 mm; Provider Global: 4 mm Swollen: 0 ; Tender: 3  Joint Exam      Right  Left  Wrist   Tender   Tender  MCP 1   Tender        Investigation: No additional findings.  Imaging: No results found.  Recent Labs: Lab Results  Component Value Date   WBC 6.7 02/09/2018   HGB 14.9 02/09/2018   PLT 352 02/09/2018   NA 139 02/09/2018   K 4.2 02/09/2018   CL 107 02/09/2018   CO2 22 02/09/2018   GLUCOSE 77 02/09/2018   BUN 16 02/09/2018   CREATININE 0.88 02/09/2018   BILITOT 0.5 02/09/2018   ALKPHOS 49 09/09/2016   AST 17 02/09/2018   ALT 17 02/09/2018   PROT 7.6 02/09/2018   ALBUMIN 4.1 09/09/2016   CALCIUM 9.9 02/09/2018   GFRAA 97 02/09/2018   QFTBGOLDPLUS NEGATIVE 09/28/2017    Speciality Comments: PLQ Eye Exam: WNL 03/24/17 @ [facility] Prior therapy: Humira (facial rash)  Procedures:  No procedures performed Allergies: Fluoxetine, Humira [adalimumab], Hydrocodone-acetaminophen, Hydroxychloroquine, Other, and Vicodin [hydrocodone-acetaminophen]   Assessment / Plan:     Visit Diagnoses: Rheumatoid arthritis involving multiple sites with positive rheumatoid factor (HCC) - +CCP, +RF: She has no obvious synovitis on exam.  She has been experiencing increased pain in bilateral hands and bilateral wrist joints.  She has tenderness of both wrists on exam but no obvious synovitis was noted.  She has been working a Network engineer job and has been  typing on a daily basis which has been exacerbating her hand pain and wrist pain.  She has no other joint pain or inflammation at this time.  She has been experiencing increased stiffness in her joints especially after sitting for prolonged periods of time.  She is on Enbrel 50 mg subcutaneous injections every week, methotrexate 8 tablets by mouth once weekly and folic acid 2 mg by mouth daily.  She has not missed any doses of these medications recently.  We discussed switching her to the injectable form of methotrexate to increase efficacy.  She was advised to notify us if she develops increased joint pain or joint swelling.  She will follow-up in the office in 3 months.  High risk medication use - Enbrel 50 mg sq weekly injections and MTX 0.8 ml sq once weekly (Humira caused facial rash).  She was previously taking tablet of MTX weekly, but she will be switched to the injectable form to increase efficacy.  CBC and CMP will be drawn today to monitor for drug toxicity.  She will return for lab work in December and every 3 months.  TB Gold was negative on 09/28/2017.  TB Gold will be updated today.- Plan: CBC with Differential/Platelet, COMPLETE METABOLIC PANEL WITH GFR  Primary osteoarthritis of both hands: She has been experiencing increased pain in both hands.  She has tenderness of the right 1st MCP joint but no synovitis was noted.  She has tenderness of both wrist joints.  She recently started a desk job and has been typing on a daily basis, which has exacerbated the pain in both hands. Joint protection and muscle strengthening were discussed.  She was given a handout of hand exercises to perform.    Primary osteoarthritis of both knees: She has good ROM with no discomfort.  No warmth or effusion of knee joints.  She experiences stiffness in both knee joints if she is sitting for prolonged period of time.  She was encouraged to change positions frequently and try to exercise on a regular basis.    Chondromalacia of both patellae  Primary osteoarthritis of both feet: She has no tenderness or inflammation on exam.  She wears proper fitting shoes.    DDD (degenerative disc disease), cervical: She has good ROM with no discomfort.   Chronic midline  low back pain without sciatica: She has no midline spinal tenderness.  She has no symptoms of sciatica.   Essential hypertension  Solitary pulmonary nodule  Bipolar 2 disorder ([place])  Orders: No orders of the defined types were placed in this encounter.  No orders of the defined types were placed in this encounter.   Face-to-face time spent with patient was 30 minutes. Greater than 50% of time was spent in counseling and coordination of care.  Follow-Up Instructions: Return in about 3 months (around 01/27/2019) for Rheumatoid arthritis, Osteoarthritis, DDD.   Ofilia Neas, PA-C  Note - This record has been created using Dragon software.  Chart creation errors have been sought, but may not always  have been located. Such creation errors do not reflect on  the standard of medical care.

## 2018-10-28 ENCOUNTER — Ambulatory Visit: Payer: Medicare Other | Admitting: *Deleted

## 2018-10-28 ENCOUNTER — Other Ambulatory Visit: Payer: Self-pay

## 2018-10-28 ENCOUNTER — Encounter: Payer: Self-pay | Admitting: Rheumatology

## 2018-10-28 VITALS — BP 139/95 | HR 80 | Resp 14 | Ht 62.5 in | Wt 162.8 lb

## 2018-10-28 DIAGNOSIS — I10 Essential (primary) hypertension: Secondary | ICD-10-CM

## 2018-10-28 DIAGNOSIS — M19071 Primary osteoarthritis, right ankle and foot: Secondary | ICD-10-CM

## 2018-10-28 DIAGNOSIS — M5030 Other cervical disc degeneration, unspecified cervical region: Secondary | ICD-10-CM

## 2018-10-28 DIAGNOSIS — Z79899 Other long term (current) drug therapy: Secondary | ICD-10-CM | POA: Diagnosis not present

## 2018-10-28 DIAGNOSIS — M19072 Primary osteoarthritis, left ankle and foot: Secondary | ICD-10-CM

## 2018-10-28 DIAGNOSIS — M545 Low back pain: Secondary | ICD-10-CM

## 2018-10-28 DIAGNOSIS — F3181 Bipolar II disorder: Secondary | ICD-10-CM

## 2018-10-28 DIAGNOSIS — M170 Bilateral primary osteoarthritis of knee: Secondary | ICD-10-CM

## 2018-10-28 DIAGNOSIS — M2242 Chondromalacia patellae, left knee: Secondary | ICD-10-CM

## 2018-10-28 DIAGNOSIS — M0579 Rheumatoid arthritis with rheumatoid factor of multiple sites without organ or systems involvement: Secondary | ICD-10-CM | POA: Diagnosis not present

## 2018-10-28 DIAGNOSIS — M19042 Primary osteoarthritis, left hand: Secondary | ICD-10-CM

## 2018-10-28 DIAGNOSIS — M19041 Primary osteoarthritis, right hand: Secondary | ICD-10-CM | POA: Diagnosis not present

## 2018-10-28 DIAGNOSIS — G8929 Other chronic pain: Secondary | ICD-10-CM

## 2018-10-28 DIAGNOSIS — R911 Solitary pulmonary nodule: Secondary | ICD-10-CM

## 2018-10-28 DIAGNOSIS — M2241 Chondromalacia patellae, right knee: Secondary | ICD-10-CM

## 2018-10-28 NOTE — Patient Instructions (Signed)
Standing Labs We placed an order today for your standing lab work.    Please come back and get your standing labs in December and every 3 months   We have open lab daily Monday through Thursday from 8:30-12:30 PM and 1:30-4:30 PM and Friday from 8:30-12:30 PM and 1:30 -4:00 PM at the office of Dr. Bo Merino.   You may experience shorter wait times on Monday and Friday afternoons. The office is located at [address] [place], [place], [place] [address] No appointment is necessary.   Labs are drawn by [facility].  You may receive a bill from [facility] for your lab work.  If you wish to have your labs drawn at another location, please call the office 24 hours in advance to send orders.  If you have any questions regarding directions or hours of operation,  please call [phone].   Just as a reminder please drink plenty of water prior to coming for your lab work. Thanks!  Hand Exercises Hand exercises can be helpful for almost anyone. These exercises can strengthen the hands, improve flexibility and movement, and increase blood flow to the hands. These results can make work and daily tasks easier. Hand exercises can be especially helpful for people who have joint pain from arthritis or have nerve damage from overuse (carpal tunnel syndrome). These exercises can also help people who have injured a hand. Exercises Most of these hand exercises are gentle stretching and motion exercises. It is usually safe to do them often throughout the day. Warming up your hands before exercise may help to reduce stiffness. You can do this with gentle massage or by placing your hands in warm water for 10-15 minutes. It is normal to feel some stretching, pulling, tightness, or mild discomfort as you begin new exercises. This will gradually improve. Stop an exercise right away if you feel sudden, severe pain or your pain gets worse. Ask your health care provider which exercises are best for you. Knuckle  bend or "claw" fist 1. Stand or sit with your arm, hand, and all five fingers pointed straight up. Make sure to keep your wrist straight during the exercise. 2. Gently bend your fingers down toward your palm until the tips of your fingers are touching the top of your palm. Keep your big knuckle straight and just bend the small knuckles in your fingers. 3. Hold this position for __________ seconds. 4. Straighten (extend) your fingers back to the starting position. Repeat this exercise 5-10 times with each hand. Full finger fist 1. Stand or sit with your arm, hand, and all five fingers pointed straight up. Make sure to keep your wrist straight during the exercise. 2. Gently bend your fingers into your palm until the tips of your fingers are touching the middle of your palm. 3. Hold this position for __________ seconds. 4. Extend your fingers back to the starting position, stretching every joint fully. Repeat this exercise 5-10 times with each hand. Straight fist 1. Stand or sit with your arm, hand, and all five fingers pointed straight up. Make sure to keep your wrist straight during the exercise. 2. Gently bend your fingers at the big knuckle, where your fingers meet your hand, and the middle knuckle. Keep the knuckle at the tips of your fingers straight and try to touch the bottom of your palm. 3. Hold this position for __________ seconds. 4. Extend your fingers back to the starting position, stretching every joint fully. Repeat this exercise 5-10 times with each  hand. Tabletop 1. Stand or sit with your arm, hand, and all five fingers pointed straight up. Make sure to keep your wrist straight during the exercise. 2. Gently bend your fingers at the big knuckle, where your fingers meet your hand, as far down as you can while keeping the small knuckles in your fingers straight. Think of forming a tabletop with your fingers. 3. Hold this position for __________ seconds. 4. Extend your fingers back  to the starting position, stretching every joint fully. Repeat this exercise 5-10 times with each hand. Finger spread 1. Place your hand flat on a table with your palm facing down. Make sure your wrist stays straight as you do this exercise. 2. Spread your fingers and thumb apart from each other as far as you can until you feel a gentle stretch. Hold this position for __________ seconds. 3. Bring your fingers and thumb tight together again. Hold this position for __________ seconds. Repeat this exercise 5-10 times with each hand. Making circles 1. Stand or sit with your arm, hand, and all five fingers pointed straight up. Make sure to keep your wrist straight during the exercise. 2. Make a circle by touching the tip of your thumb to the tip of your index finger. 3. Hold for __________ seconds. Then open your hand wide. 4. Repeat this motion with your thumb and each finger on your hand. Repeat this exercise 5-10 times with each hand. Thumb motion 1. Sit with your forearm resting on a table and your wrist straight. Your thumb should be facing up toward the ceiling. Keep your fingers relaxed as you move your thumb. 2. Lift your thumb up as high as you can toward the ceiling. Hold for __________ seconds. 3. Bend your thumb across your palm as far as you can, reaching the tip of your thumb for the small finger (pinkie) side of your palm. Hold for __________ seconds. Repeat this exercise 5-10 times with each hand. Grip strengthening  1. Hold a stress ball or other soft ball in the middle of your hand. 2. Slowly increase the pressure, squeezing the ball as much as you can without causing pain. Think of bringing the tips of your fingers into the middle of your palm. All of your finger joints should bend when doing this exercise. 3. Hold your squeeze for __________ seconds, then relax. Repeat this exercise 5-10 times with each hand. Contact a health care provider if:  Your hand pain or discomfort  gets much worse when you do an exercise.  Your hand pain or discomfort does not improve within 2 hours after you exercise. If you have any of these problems, stop doing these exercises right away. Do not do them again unless your health care provider says that you can. Get help right away if:  You develop sudden, severe hand pain or swelling. If this happens, stop doing these exercises right away. Do not do them again unless your health care provider says that you can. This information is not intended to replace advice given to you by your health care provider. Make sure you discuss any questions you have with your health care provider. Document Released: 01/22/2015 Document Revised: 06/03/2018 Document Reviewed: 02/11/2018 Elsevier Patient Education  2020 [employer].

## 2018-10-29 NOTE — Progress Notes (Signed)
Absolute monocyte is borderline low.  Rest of CBC WNL.  CMP WNL

## 2018-10-30 LAB — CBC WITH DIFFERENTIAL/PLATELET
Absolute Monocytes: 193 cells/uL — ABNORMAL LOW (ref 200–950)
Basophils Absolute: 39 cells/uL (ref 0–200)
Basophils Relative: 0.7 %
Eosinophils Absolute: 424 cells/uL (ref 15–500)
Eosinophils Relative: 7.7 %
HCT: 42.8 % (ref 35.0–45.0)
Hemoglobin: 14.8 g/dL (ref 11.7–15.5)
Lymphs Abs: 2024 cells/uL (ref 850–3900)
MCH: 31.8 pg (ref 27.0–33.0)
MCHC: 34.6 g/dL (ref 32.0–36.0)
MCV: 91.8 fL (ref 80.0–100.0)
MPV: 10 fL (ref 7.5–12.5)
Monocytes Relative: 3.5 %
Neutro Abs: 2822 cells/uL (ref 1500–7800)
Neutrophils Relative %: 51.3 %
Platelets: 311 10*3/uL (ref 140–400)
RBC: 4.66 10*6/uL (ref 3.80–5.10)
RDW: 13.6 % (ref 11.0–15.0)
Total Lymphocyte: 36.8 %
WBC: 5.5 10*3/uL (ref 3.8–10.8)

## 2018-10-30 LAB — COMPLETE METABOLIC PANEL WITH GFR
AG Ratio: 1.3 (calc) (ref 1.0–2.5)
ALT: 26 U/L (ref 6–29)
AST: 22 U/L (ref 10–30)
Albumin: 4.2 g/dL (ref 3.6–5.1)
Alkaline phosphatase (APISO): 46 U/L (ref 31–125)
BUN: 14 mg/dL (ref 7–25)
CO2: 24 mmol/L (ref 20–32)
Calcium: 9.8 mg/dL (ref 8.6–10.2)
Chloride: 107 mmol/L (ref 98–110)
Creat: 0.94 mg/dL (ref 0.50–1.10)
GFR, Est African American: 89 mL/min/{1.73_m2} (ref >=60)
GFR, Est Non African American: 77 mL/min/{1.73_m2} (ref >=60)
Globulin: 3.3 g/dL (calc) (ref 1.9–3.7)
Glucose, Bld: 94 mg/dL (ref 65–99)
Potassium: 4.3 mmol/L (ref 3.5–5.3)
Sodium: 139 mmol/L (ref 135–146)
Total Bilirubin: 0.4 mg/dL (ref 0.2–1.2)
Total Protein: 7.5 g/dL (ref 6.1–8.1)

## 2018-10-30 LAB — QUANTIFERON-TB GOLD PLUS
Mitogen-NIL: >10 IU/mL
NIL: 0.08 IU/mL
QuantiFERON-TB Gold Plus: NEGATIVE
TB1-NIL: <0 IU/mL
TB2-NIL: 0 IU/mL

## 2018-11-02 NOTE — Progress Notes (Signed)
TB gold negative

## 2018-11-03 ENCOUNTER — Telehealth: Payer: Self-pay | Admitting: Rheumatology

## 2018-11-03 MED ORDER — "TUBERCULIN SYRINGE 27G X 1/2"" 1 ML MISC": 12.0000 | 3 refills | Status: DC

## 2018-11-03 MED ORDER — METHOTREXATE SODIUM CHEMO INJECTION 50 MG/2ML: 20.0000 mg | INTRAMUSCULAR | 0 refills | Status: DC

## 2018-11-03 NOTE — Telephone Encounter (Signed)
Patient left a voicemail requesting prescription refill of Methotrexate.  Patient states she is due to take her injection today.

## 2018-11-03 NOTE — Telephone Encounter (Signed)
Last Visit: 10/28/18 Next visit: 01/28/19 Labs: 10/28/18 Absolute monocyte is borderline low. Rest of CBC WNL. CMP WNL.   Okay to refill per Dr. Estanislado Pandy

## 2018-12-11 ENCOUNTER — Other Ambulatory Visit: Payer: Self-pay

## 2018-12-11 ENCOUNTER — Encounter (HOSPITAL_COMMUNITY): Payer: Self-pay

## 2018-12-11 ENCOUNTER — Ambulatory Visit (HOSPITAL_COMMUNITY)
Admission: EM | Admit: 2018-12-11 | Discharge: 2018-12-11 | Disposition: A | Discharge status: Home / Self Care | Payer: Medicaid Other | Attending: Family Medicine | Admitting: Family Medicine

## 2018-12-11 DIAGNOSIS — Z760 Encounter for issue of repeat prescription: Secondary | ICD-10-CM | POA: Diagnosis not present

## 2018-12-11 DIAGNOSIS — I10 Essential (primary) hypertension: Secondary | ICD-10-CM | POA: Diagnosis not present

## 2018-12-11 DIAGNOSIS — J4521 Mild intermittent asthma with (acute) exacerbation: Secondary | ICD-10-CM

## 2018-12-11 MED ORDER — ALBUTEROL SULFATE HFA 108 (90 BASE) MCG/ACT IN AERS
INHALATION_SPRAY | RESPIRATORY_TRACT | Status: AC
  Filled 2018-12-11: qty 6.7

## 2018-12-11 MED ORDER — PREDNISONE 20 MG PO TABS: 40.0000 mg | ORAL_TABLET | Freq: Every day | ORAL | 0 refills | Status: AC

## 2018-12-11 MED ORDER — METHYLPREDNISOLONE SODIUM SUCC 125 MG IJ SOLR
125.0000 mg | Freq: Once | INTRAMUSCULAR | Status: AC
  Administered 2018-12-11: 125 mg via INTRAMUSCULAR

## 2018-12-11 MED ORDER — ALBUTEROL SULFATE HFA 108 (90 BASE) MCG/ACT IN AERS: 1.0000 | INHALATION_SPRAY | Freq: Four times a day (QID) | RESPIRATORY_TRACT | 0 refills | Status: AC | PRN

## 2018-12-11 MED ORDER — METHYLPREDNISOLONE SODIUM SUCC 125 MG IJ SOLR
INTRAMUSCULAR | Status: AC
  Filled 2018-12-11: qty 2

## 2018-12-11 MED ORDER — ALBUTEROL SULFATE HFA 108 (90 BASE) MCG/ACT IN AERS
2.0000 | INHALATION_SPRAY | Freq: Once | RESPIRATORY_TRACT | Status: AC
  Administered 2018-12-11: 19:00:00 2 via RESPIRATORY_TRACT

## 2018-12-11 NOTE — ED Provider Notes (Signed)
Center    CSN: [id number] Arrival date & time: 12/11/18  1734      History   Chief Complaint Chief Complaint  Patient presents with  . Medication Refill    HPI Elizabeth Pope is a 38 y.o. female.   Elizabeth Pope presents with complaints of wheezing and asthma flare. Ran out of her inhaler. Wheezing and cough due to wheezing, started 3 days ago. Shortness of breath. Feels similar to other asthma exacerbations. Has significantly decreased smoking. No fevers. No congestion or other URI symptoms. Does take regular daily allergy medications. States she tried to see her PCP but was denied due to her cough. Denies any history of intubation. History  Of anxiety, bipolar, depression, htn, migraines, RA, asthma.    ROS per HPI, negative if not otherwise mentioned.      Past Medical History:  Diagnosis Date  . Anxiety   . Bipolar 1 disorder ([place])   . DDD (degenerative disc disease), cervical 12/25/2015  . Depression   . GERD (gastroesophageal reflux disease) 12/25/2015  . Hypertension 12/25/2015  . Migraine   . Migraines 12/25/2015  . 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/2015   mild  . Osteoarthritis of both hands 12/25/2015  . RA (rheumatoid arthritis) (HCC) 12/25/2015   Positive RF, Elevated ESR, Positive CCP > 250     Patient Active Problem List   Diagnosis Date Noted  . Asthma with acute exacerbation 09/12/2018  . 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 Pelvic pain 06/02/2017  . Atypical squamous cell changes of undetermined significance (ASCUS) on cervical cytology with negative high risk human papilloma virus (HPV) test result 02/03/2017  . Encounter for IUD insertion 01/28/2017  . Uterine fibroid 01/21/2017  . H/O tubal ligation 06/24/2016  . High risk medication use 01/07/2016  . GERD (gastroesophageal reflux disease) 12/25/2015  . Migraines 12/25/2015  . Hypertension 12/25/2015  . RA (rheumatoid arthritis) ([place])  12/25/2015  . DDD (degenerative disc disease), cervical 12/25/2015  . Osteoarthritis of both hands 12/25/2015  . 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/2015  . 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 Bipolar 2 disorder ([place]) 05/14/2015  . Cigarette smoker 05/05/2015  . Chest pain 05/05/2015  . Solitary pulmonary nodule 05/04/2015    Past Surgical History:  Procedure Laterality Date  . CESAREAN SECTION    . TUBAL LIGATION      OB History    Gravida  4   Para  3   Term  3   Preterm      AB  1   Living  3     SAB  1   TAB      Ectopic      Multiple      Live Births  3            Home Medications    Prior to Admission medications   Medication Sig Start Date End Date Taking? Authorizing Provider  albuterol (VENTOLIN HFA) 108 (90 Base) MCG/ACT inhaler Inhale 1-2 puffs into the lungs every 6 (six) hours as needed for wheezing or shortness of breath. 12/11/18   Augusto Gamble B, NP  amLODipine (NORVASC) 5 MG tablet Take 1 tablet by mouth daily. Reported on 08/27/2015 04/21/15   [provider]  cetirizine (ZYRTEC) 10 MG tablet TK 1 T PO D PRN 06/12/16   [provider]  diazepam (VALIUM) 10 MG tablet as needed.  07/07/17   [provider]  etanercept (ENBREL SURECLICK) 50 MG/ML injection INJECT 0.98 MLS (50 MG TOTAL) INTO THE SKIN ONCE A WEEK. 08/02/18   Bo Merino, MD  folic acid (FOLVITE) 1 MG tablet TAKE 2 TABLETS BY MOUTH EVERY DAY 05/20/18   Bo Merino, MD  hydrOXYzine (ATARAX/VISTARIL) 25 MG tablet Take 25 mg by mouth 3 (three) times daily as needed.    [provider]  lamoTRIgine (LAMICTAL) 100 MG tablet Take 100 mg daily by mouth. 03/18/16   [provider]  methotrexate (RHEUMATREX) 2.5 MG tablet TAKE 8 TABLETS BY MOUTH ONCE A WEEK...PROTECT FROM LIGHT 06/07/18   Bo Merino, MD  methotrexate 50 MG/2ML injection Inject 0.8 mLs (20 mg total) into the skin once a week.  11/03/18   Bo Merino, MD  montelukast (SINGULAIR) 10 MG tablet Take 10 mg by mouth at bedtime.    [provider]  PAZEO 0.7 % SOLN INT 1 GTT INTO  OU QAM 09/13/18   [provider]  predniSONE (DELTASONE) 20 MG tablet Take 2 tablets (40 mg total) by mouth daily with breakfast for 5 days. 12/11/18 12/16/18  Augusto Gamble B, NP  RESTASIS 0.05 % ophthalmic emulsion INT 1 GTT INTO  OU BID 09/13/18   [provider]  sertraline (ZOLOFT) 100 MG tablet Take 100 mg daily by mouth. 12/16/16   [provider]  traZODone (DESYREL) 50 MG tablet Take 100 mg by mouth at bedtime as needed.  04/11/15   [provider]  triamcinolone cream (KENALOG) 0.1 % Apply 1 application topically as needed.  11/13/16   [provider]  TUBERCULIN SYR 1CC/27GX1/2" (B-D TB SYRINGE 1CC/27GX1/2") 27G X 1/2" 1 ML MISC 12 Syringes by Does not apply route once a week. 11/03/18   Bo Merino, MD    Family History Family History  Problem Relation Age of Onset  . Rheum arthritis Mother   . Migraines Mother   . Hypertension Mother   . Colitis Mother   . Rheum arthritis Maternal Grandmother   . Rheum arthritis Maternal Aunt   . Migraines Sister   . Migraines Brother   . Sickle cell anemia Daughter   . Bipolar disorder Daughter   . Anxiety disorder Daughter   . Depression Daughter   . Migraines Daughter   . Arrhythmia Daughter   . Migraines Son   . ADD / ADHD Son   . Seizures Son   . Arrhythmia Son     Social History Social History   Tobacco Use  . Smoking status: Current Some Day Smoker    Packs/day: 0.50    Years: 19.00    Pack years: 9.50    Types: Cigars  . Smokeless tobacco: Never Used  . Tobacco comment: former cigarette smoker  Substance Use Topics  . Alcohol use: Yes    Alcohol/week: 0.0 standard drinks    Comment: occasional  . Drug use: No     Allergies   Fluoxetine, Humira [adalimumab], Hydrocodone-acetaminophen, Hydroxychloroquine,  Other, and Vicodin [hydrocodone-acetaminophen]   Review of Systems Review of Systems   Physical Exam Triage Vital Signs ED Triage Vitals  Enc Vitals Group     BP 12/11/18 1753 (!) 172/111     Pulse Rate 12/11/18 1753 81     Resp 12/11/18 1753 18     Temp 12/11/18 1753 98.4 F (36.9 C)     Temp src --      SpO2 12/11/18 1753 98 %     Weight 12/11/18 1752 162 lb (  73.5 kg)     Height --      Head Circumference --      Peak Flow --      Pain Score 12/11/18 1752 6     Pain Loc --      Pain Edu? --      Excl. in [place]? --    No data found.  Updated Vital Signs BP (!) 172/111 (BP Location: Right Arm)   Pulse 81   Temp 98.4 F (36.9 C)   Resp 18   Wt 162 lb (73.5 kg)   SpO2 98%   BMI 29.16 kg/m    Physical Exam Constitutional:      General: She is not in acute distress.    Appearance: She is well-developed.  Cardiovascular:     Rate and Rhythm: Normal rate and regular rhythm.     Heart sounds: Normal heart sounds.  Pulmonary:     Effort: Pulmonary effort is normal. Tachypnea present. No respiratory distress.     Breath sounds: Wheezing present.     Comments: Audible wheezing while speaking without obvious shortness of breath , occasional cough. Wheezing throughout lung fields  Skin:    General: Skin is warm and dry.  Neurological:     Mental Status: She is alert and oriented to person, place, and time.      UC Treatments / Results  Labs (all labs ordered are listed, but only abnormal results are displayed) Labs Reviewed - No data to display  EKG   Radiology No results found.  Procedures Procedures (including critical care time)  Medications Ordered in UC Medications  albuterol (VENTOLIN HFA) 108 (90 Base) MCG/ACT inhaler 2 puff (has no administration in time range)  methylPREDNISolone sodium succinate (SOLU-MEDROL) 125 mg/2 mL injection 125 mg (has no administration in time range)    Initial Impression / Assessment and Plan / UC Course  I have  reviewed the triage vital signs and the nursing notes.  Pertinent labs & imaging results that were available during my care of the patient were reviewed by me and considered in my medical decision making (see chart for details).     3 days of asthma flare. Solumedrol provided here today, and inhaler given, with refill at pharmacy as needed. Additional 5 days of prednisone. Return precautions provided. Patient verbalized understanding and agreeable to plan.  Ambulatory out of clinic without difficulty.   Final Clinical Impressions(s) / UC Diagnoses   Final diagnoses:  Mild intermittent asthma with acute exacerbation  Medication refill     Discharge Instructions     5 more days of oral prednisone.  Continue with your daily allergy medications.  I have sent what I think is the yellow albuterol inhaler to the pharmacy if you would like to pick this up.  Continue to follow with your primary care provider for asthma management.  If any worsening of symptoms or if not getting better please go to the ER.    ED Prescriptions    Medication Sig Dispense Auth. Provider   albuterol (VENTOLIN HFA) 108 (90 Base) MCG/ACT inhaler Inhale 1-2 puffs into the lungs every 6 (six) hours as needed for wheezing or shortness of breath. 8 g Augusto Gamble B, NP   predniSONE (DELTASONE) 20 MG tablet Take 2 tablets (40 mg total) by mouth daily with breakfast for 5 days. 10 tablet Zigmund Gottron, NP     PDMP not reviewed this encounter.   Zigmund Gottron, NP 12/11/18 [phone]

## 2018-12-11 NOTE — Discharge Instructions (Addendum)
5 more days of oral prednisone.  Continue with your daily allergy medications.  I have sent what I think is the yellow albuterol inhaler to the pharmacy if you would like to pick this up.  Continue to follow with your primary care provider for asthma management.  If any worsening of symptoms or if not getting better please go to the ER.

## 2018-12-11 NOTE — ED Triage Notes (Signed)
Pt states she needs her asthma refill .

## 2018-12-23 ENCOUNTER — Other Ambulatory Visit: Payer: Self-pay | Admitting: Rheumatology

## 2018-12-23 MED ORDER — METHOTREXATE SODIUM CHEMO INJECTION 50 MG/2ML: 20.0000 mg | INTRAMUSCULAR | 0 refills | Status: DC

## 2019-01-14 NOTE — Progress Notes (Signed)
Virtual Visit via Telephone Note  I connected with Elizabeth Pope on 01/28/19 at 10:40 AM EST by telephone and verified that I am speaking with the correct person using two identifiers.  Location: Patient: Home  Provider: Clinic  This service was conducted via virtual visit.  Both audio and visual tools were used.  The patient was located at home. I was located in my office.  Consent was obtained prior to the virtual visit and is aware of possible charges through their insurance for this visit.  The patient is an established patient.  Dr. Estanislado Pandy, MD conducted the virtual visit and Hazel Sams, PA-C acted as scribe during the service.  Office staff helped with scheduling follow up visits after the service was conducted.   I discussed the limitations, risks, security and privacy concerns of performing an evaluation and management service by telephone and the availability of in person appointments. I also discussed with the patient that there may be a patient responsible charge related to this service. The patient expressed understanding and agreed to proceed.  CC: Pain in multiple joints  History of Present Illness: Patient is a 38 year old female with a past medical history of seropositive rheumatoid arthritis, osteoarthritis, and DDD.  She has missed 1 month of Enbrel due to requiring a refill.  She is on MTX 0.8 ml sq once weekly and folic acid 2 mg po daily. She is currently having a RA flare in both hands, both knee joints, and both feet.  She is having swelling and stiffness in both hands.    Review of Systems  Constitutional: Negative for fever and malaise/fatigue.  HENT:       +Dry mouth  Eyes: Negative for photophobia, pain, discharge and redness.       +Dry eyes  Respiratory: Negative for cough, shortness of breath and wheezing.   Cardiovascular: Negative for chest pain and palpitations.  Gastrointestinal: Negative for blood in stool, constipation and diarrhea.   Genitourinary: Negative for dysuria.  Musculoskeletal: Positive for joint pain. Negative for back pain, myalgias and neck pain.       +Joint swelling +Morning stiffness   Skin: Negative for rash.  Neurological: Negative for dizziness and headaches.  Psychiatric/Behavioral: Positive for depression. The patient is nervous/anxious. The patient does not have insomnia.     She rates her RA a 9/10.      Observations/Objective: Physical Exam  Constitutional: She is oriented to person, place, and time.  Neurological: She is alert and oriented to person, place, and time.  Psychiatric: Mood, memory, affect and judgment normal.   Patient reports morning stiffness for 1 hour.   Patient reports nocturnal pain.  Difficulty dressing/grooming: Denies Difficulty climbing stairs: Reports Difficulty getting out of chair: Reports Difficulty using hands for taps, buttons, cutlery, and/or writing: Reports   Assessment and Plan: Visit Diagnoses: Rheumatoid arthritis involving multiple sites with positive rheumatoid factor (HCC) - +CCP, +RF: She is currently having a rheumatoid arthritis flare.  She is having pain and swelling in both hands.  She has also been having increased pain in both knee joints and both feet.  She has difficulty climbing steps and getting up from a chair due to the discomfort and stiffness.  She has been off of Enbrel for 1 month due to requiring a refill.  She has been injecting MTX 0.8 ml sq once weekly and folic acid 2 mg po daily. We will send in a refill of Enbrel and a prescription for prednisone  20 mg tapering by 5 mg every 4 days.  She will follow up in 3-4 months.   High risk medication use - Enbrel 50 mg sq weekly injections, MTX 0.8 ml sq once weekly, and folic acid 2 mg po daily. (Humira caused facial rash).  CBC and CMP were drawn on 10/27/28.  She is due to update lab work and is planning on going today.  Lab orders will be released.  TB gold negative on 10/28/18.   Primary  osteoarthritis of both hands: She is having increased pain and swelling in both hands.    Primary osteoarthritis of both knees: She is having increased pain in bilateral knee joints.   Chondromalacia of both patellae  Primary osteoarthritis of both feet: She is having increased pain and stiffness in both feet.   DDD (degenerative disc disease), cervical: She has chronic neck pain and stiffness.  She has no symptoms of radiculopathy.   Chronic midline low back pain without sciatica: She has chronic lower back pain.  She has no symptoms of sciatica.   Other medical conditions are listed as follows:   Essential hypertension  Solitary pulmonary nodule  Bipolar 2 disorder ([place])  Follow Up Instructions: She will follow up in 3-4 months.    I discussed the assessment and treatment plan with the patient. The patient was provided an opportunity to ask questions and all were answered. The patient agreed with the plan and demonstrated an understanding of the instructions.   The patient was advised to call back or seek an in-person evaluation if the symptoms worsen or if the condition fails to improve as anticipated.  I provided 15 minutes of non-face-to-face time during this encounter.   Bo Merino, MD    Scribed by-  Hazel Sams, PA-C

## 2019-01-18 ENCOUNTER — Other Ambulatory Visit: Payer: Self-pay | Admitting: Rheumatology

## 2019-01-19 NOTE — Telephone Encounter (Signed)
Last Visit: 10/28/2018 Next Visit: 01/28/2019 Labs: 10/28/2018 Absolute monocyte is borderline low. Rest of CBC WNL. CMP WNL TB Gold: 10/28/2018 negative   Left message on machine for patient to call the office about this refill.

## 2019-01-25 NOTE — Telephone Encounter (Signed)
Attempted to contact patient and patient was not available at the moment. Patient will call back.

## 2019-01-28 ENCOUNTER — Other Ambulatory Visit: Payer: Self-pay

## 2019-01-28 ENCOUNTER — Telehealth (INDEPENDENT_AMBULATORY_CARE_PROVIDER_SITE_OTHER): Payer: Medicare Other | Admitting: Rheumatology

## 2019-01-28 ENCOUNTER — Other Ambulatory Visit: Payer: Self-pay | Admitting: Rheumatology

## 2019-01-28 ENCOUNTER — Encounter: Payer: Self-pay | Admitting: Rheumatology

## 2019-01-28 ENCOUNTER — Other Ambulatory Visit: Payer: Self-pay | Admitting: *Deleted

## 2019-01-28 DIAGNOSIS — M2242 Chondromalacia patellae, left knee: Secondary | ICD-10-CM | POA: Diagnosis not present

## 2019-01-28 DIAGNOSIS — M0579 Rheumatoid arthritis with rheumatoid factor of multiple sites without organ or systems involvement: Secondary | ICD-10-CM

## 2019-01-28 DIAGNOSIS — Z8719 Personal history of other diseases of the digestive system: Secondary | ICD-10-CM

## 2019-01-28 DIAGNOSIS — F17210 Nicotine dependence, cigarettes, uncomplicated: Secondary | ICD-10-CM

## 2019-01-28 DIAGNOSIS — M19041 Primary osteoarthritis, right hand: Secondary | ICD-10-CM

## 2019-01-28 DIAGNOSIS — R911 Solitary pulmonary nodule: Secondary | ICD-10-CM

## 2019-01-28 DIAGNOSIS — Z227 Latent tuberculosis: Secondary | ICD-10-CM

## 2019-01-28 DIAGNOSIS — M5030 Other cervical disc degeneration, unspecified cervical region: Secondary | ICD-10-CM

## 2019-01-28 DIAGNOSIS — Z79899 Other long term (current) drug therapy: Secondary | ICD-10-CM

## 2019-01-28 DIAGNOSIS — M19042 Primary osteoarthritis, left hand: Secondary | ICD-10-CM

## 2019-01-28 DIAGNOSIS — F3181 Bipolar II disorder: Secondary | ICD-10-CM

## 2019-01-28 DIAGNOSIS — M19072 Primary osteoarthritis, left ankle and foot: Secondary | ICD-10-CM

## 2019-01-28 DIAGNOSIS — Z8669 Personal history of other diseases of the nervous system and sense organs: Secondary | ICD-10-CM

## 2019-01-28 DIAGNOSIS — M2241 Chondromalacia patellae, right knee: Secondary | ICD-10-CM

## 2019-01-28 DIAGNOSIS — M19071 Primary osteoarthritis, right ankle and foot: Secondary | ICD-10-CM

## 2019-01-28 DIAGNOSIS — I10 Essential (primary) hypertension: Secondary | ICD-10-CM

## 2019-01-28 DIAGNOSIS — M170 Bilateral primary osteoarthritis of knee: Secondary | ICD-10-CM

## 2019-01-28 MED ORDER — PREDNISONE 5 MG PO TABS: ORAL_TABLET | ORAL | 0 refills | Status: DC

## 2019-01-28 MED ORDER — ENBREL SURECLICK 50 MG/ML ~~LOC~~ SOAJ: SUBCUTANEOUS | 0 refills | Status: DC

## 2019-01-28 MED FILL — predniSONE 5 MG TABS: 5 | 16 days supply | Qty: 40 | Fill #0

## 2019-01-28 MED FILL — ENBREL SURECLICK 50 MG/ML S: 50 | 28 days supply | Qty: 4 | Fill #0

## 2019-01-28 NOTE — Telephone Encounter (Signed)
Last Visit: 01/28/2019 Next Visit: message sent to the front desk to schedule.   Okay to refill per Dr. Estanislado Pandy.

## 2019-02-11 ENCOUNTER — Telehealth: Payer: Self-pay | Admitting: Rheumatology

## 2019-02-11 NOTE — Telephone Encounter (Signed)
I LMOM for patient to call, and schedule a follow up appt around 04/27/2019.

## 2019-02-11 NOTE — Telephone Encounter (Signed)
-----   Message from Shona Needles, RT sent at 02/01/2019 11:09 AM EST ----- Regarding: 3-4 MONTH F/U

## 2019-02-21 ENCOUNTER — Telehealth: Payer: Self-pay | Admitting: Pharmacy Technician

## 2019-02-21 MED FILL — ENBREL SURECLICK 50 MG/ML S: 50 | 28 days supply | Qty: 4 | Fill #0

## 2019-02-21 NOTE — Telephone Encounter (Signed)
Received notification from [facility] Medicaid regarding a prior authorization for ENBREL. Authorization has been APPROVED from 02/21/19 to 02/21/20.   Will send document to scan center.  Authorization # [id number]  12:46 PM Beatriz Chancellor, CPhT

## 2019-03-01 ENCOUNTER — Other Ambulatory Visit: Payer: Self-pay | Admitting: Rheumatology

## 2019-03-01 NOTE — Telephone Encounter (Signed)
Last Visit: 01/28/2019 Next Visit: due March 2021. [place] desk has message to schedule to patient.  Labs: 10/28/18 Absolute monocyte is borderline low. Rest of CBC WNL. CMP WNL  Left message to advise patient she is due to update labs as well as schedule a follow up visit.   Okay to refill 30 day supply MTX?

## 2019-03-01 NOTE — Telephone Encounter (Signed)
ok 

## 2019-03-21 ENCOUNTER — Other Ambulatory Visit: Payer: Self-pay | Admitting: Internal Medicine

## 2019-03-21 ENCOUNTER — Other Ambulatory Visit: Payer: Self-pay | Admitting: Rheumatology

## 2019-03-21 ENCOUNTER — Other Ambulatory Visit: Payer: Self-pay

## 2019-03-21 ENCOUNTER — Encounter (HOSPITAL_COMMUNITY): Payer: Self-pay | Admitting: Emergency Medicine

## 2019-03-21 ENCOUNTER — Ambulatory Visit (HOSPITAL_COMMUNITY)
Admission: EM | Admit: 2019-03-21 | Discharge: 2019-03-21 | Disposition: A | Discharge status: Home / Self Care | Payer: Medicare Other | Attending: Internal Medicine | Admitting: Internal Medicine

## 2019-03-21 DIAGNOSIS — U071 COVID-19: Secondary | ICD-10-CM

## 2019-03-21 DIAGNOSIS — Z79899 Other long term (current) drug therapy: Secondary | ICD-10-CM

## 2019-03-21 LAB — POC SARS CORONAVIRUS 2 AG -  ED
SARS Coronavirus 2 Ag: NEGATIVE
SARS Coronavirus 2 Ag: POSITIVE — AB
SARS Coronavirus 2 Ag: POSITIVE — AB

## 2019-03-21 LAB — POC SARS CORONAVIRUS 2 AG
SARS Coronavirus 2 Ag: POSITIVE — AB
SARS Coronavirus 2 Ag: POSITIVE — AB

## 2019-03-21 NOTE — Discharge Instructions (Addendum)
You have tested positive for COVID-19 by way of our Rapid Antigen test. You will need to quarantine at this time.   The CDC Guidelines state you may stop quarantine once the below have been met. - It has been 10 days from the onset of symptoms  - You have had no fever for 24 consecutive hours without the use of fever reducing medications such as tylenol or advil - any  Covid related symptoms are improving.  - if you are not feeling better at day 10, please continue to monitor symptoms for improvement and reach out to your Primary care provider to discuss when to discontinue isolation   Take tylenol 376m tablet x 2 up to every 6 hours for sore throat, fever and body aches. Do not exceed 8 tablets in 24 hours  Go directly to the Emergency Department or call 911 if you have severe chest pain, shortness of breath, severe diarrhea or feel as though you might pass out.

## 2019-03-21 NOTE — ED Triage Notes (Signed)
General aches noticed Saturday, gradually worsened.  Reports fever of 101.5 at home last night.   No cough, no runny nose, no sob.  Says her sense of taste is "off" patient sweats alot

## 2019-03-21 NOTE — Progress Notes (Signed)
  I connected by phone with Elizabeth Pope on 03/21/2019 at 11:57 AM to discuss the potential use of an new treatment for mild to moderate COVID-19 viral infection in non-hospitalized patients.  This patient is a 39 y.o. female that meets the FDA criteria for Emergency Use Authorization of bamlanivimab or casirivimab\imdevimab.  Has a (+) direct SARS-CoV-2 viral test result  Has mild or moderate COVID-19   Is ? 40 years of age and weighs ? 40 kg  Is NOT hospitalized due to COVID-19  Is NOT requiring oxygen therapy or requiring an increase in baseline oxygen flow rate due to COVID-19  Is within 10 days of symptom onset  Has at least one of the high risk factor(s) for progression to severe COVID-19 and/or hospitalization as defined in EUA.  Specific high risk criteria : Currently receiving immunosuppressive treatment   I have spoken and communicated the following to the patient or parent/caregiver:  1. FDA has authorized the emergency use of bamlanivimab and casirivimab\imdevimab for the treatment of mild to moderate COVID-19 in adults and pediatric patients with positive results of direct SARS-CoV-2 viral testing who are 62 years of age and older weighing at least 40 kg, and who are at high risk for progressing to severe COVID-19 and/or hospitalization.  2. The significant known and potential risks and benefits of bamlanivimab and casirivimab\imdevimab, and the extent to which such potential risks and benefits are unknown.  3. Information on available alternative treatments and the risks and benefits of those alternatives, including clinical trials.  4. Patients treated with bamlanivimab and casirivimab\imdevimab should continue to self-isolate and use infection control measures (e.g., wear mask, isolate, social distance, avoid sharing personal items, clean and disinfect "high touch" surfaces, and frequent handwashing) according to CDC guidelines.   5. The patient or  parent/caregiver has the option to accept or refuse bamlanivimab or casirivimab\imdevimab .  After reviewing this information with the patient, The patient agreed to proceed with receiving the bamlanimivab infusion and will be provided a copy of the Fact sheet prior to receiving the infusion.Alan Ripper, NP-C [place]

## 2019-03-21 NOTE — ED Provider Notes (Signed)
[place]    CSN: [id number] Arrival date & time: 03/21/19  1607      History   Chief Complaint Chief Complaint  Patient presents with  . Generalized Body Aches    HPI Elizabeth Pope is a 39 y.o. female.   Patient reports to urgent care today for 2 day history of bodyache, chills and fever. She also reports loss of sense of taste. She had a mild headache on Saturday when symptoms all began but that has resided. She reports temperature up to 101.5. She has had 1 dose of tylenol, which was this morning prior to visit.   She denies cough, congestion, shortness of breath, chest pain, nausea, vomiting, diarrhea.   She had a gathering due to a death in the family over the weekend but is not sure if anyone was sick at the time.  She has a history of Rheumatoid Arthritis and receives regular treatment for this.      Past Medical History:  Diagnosis Date  . Anxiety   . Bipolar 1 disorder ([place])   . DDD (degenerative disc disease), cervical 12/25/2015  . Depression   . GERD (gastroesophageal reflux disease) 12/25/2015  . Hypertension 12/25/2015  . Migraine   . Migraines 12/25/2015  . 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/2015   mild  . Osteoarthritis of both hands 12/25/2015  . RA (rheumatoid arthritis) ([facility]) 12/25/2015   Positive RF, Elevated ESR, Positive CCP > 250     Patient Active Problem List   Diagnosis Date Noted  . Asthma with acute exacerbation 09/12/2018  . 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 Pelvic pain 06/02/2017  . Atypical squamous cell changes of undetermined significance (ASCUS) on cervical cytology with negative high risk human papilloma virus (HPV) test result 02/03/2017  . Encounter for IUD insertion 01/28/2017  . Uterine fibroid 01/21/2017  . H/O tubal ligation 06/24/2016  . High risk medication use 01/07/2016  . GERD (gastroesophageal reflux disease) 12/25/2015  . Migraines 12/25/2015  . Hypertension 12/25/2015  .  RA (rheumatoid arthritis) ([place]) 12/25/2015  . DDD (degenerative disc disease), cervical 12/25/2015  . Osteoarthritis of both hands 12/25/2015  . 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/2015  . 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 Bipolar 2 disorder ([place]) 05/14/2015  . Cigarette smoker 05/05/2015  . Chest pain 05/05/2015  . Solitary pulmonary nodule 05/04/2015    Past Surgical History:  Procedure Laterality Date  . CESAREAN SECTION    . TUBAL LIGATION      OB History    Gravida  4   Para  3   Term  3   Preterm      AB  1   Living  3     SAB  1   TAB      Ectopic      Multiple      Live Births  3            Home Medications    Prior to Admission medications   Medication Sig Start Date End Date Taking? Authorizing Provider  acetaminophen (TYLENOL) 325 MG tablet Take 650 mg by mouth every 6 (six) hours as needed.   Yes [provider]  albuterol (VENTOLIN HFA) 108 (90 Base) MCG/ACT inhaler Inhale 1-2 puffs into the lungs every 6 (six) hours as needed for wheezing or shortness of breath. 12/11/18  Yes Burky, Lanelle Bal B, NP  amLODipine (NORVASC) 5 MG tablet Take 1 tablet  by mouth daily. Reported on 08/27/2015 04/21/15  Yes [provider]  cetirizine (ZYRTEC) 10 MG tablet TK 1 T PO D PRN 06/12/16  Yes [provider]  ergocalciferol (VITAMIN D2) 1.25 MG (50000 UT) capsule Take 50,000 Units by mouth once a week.   Yes [provider]  etanercept (ENBREL SURECLICK) 50 MG/ML injection INJECT 0.98 MLS (50 MG TOTAL) INTO THE SKIN ONCE A WEEK. 01/28/19  Yes Deveshwar, Abel Presto, MD  Fluticasone-Salmeterol (ADVAIR) 100-50 MCG/DOSE AEPB Inhale 1 puff into the lungs 2 (two) times daily.   Yes [provider]  folic acid (FOLVITE) 1 MG tablet TAKE 2 TABLETS BY MOUTH EVERY DAY 01/28/19  Yes Deveshwar, Abel Presto, MD  hydrOXYzine (ATARAX/VISTARIL) 25 MG tablet Take 25 mg by mouth 3 (three) times daily as needed.    Yes [provider]  lamoTRIgine (LAMICTAL) 100 MG tablet Take 100 mg daily by mouth. 03/18/16  Yes [provider]  methotrexate 50 MG/2ML injection Inject 0.8 mLs (20 mg total) into the skin once a week. 11/03/18  Yes Deveshwar, Abel Presto, MD  methotrexate 50 MG/2ML injection ADMINISTER 0.8 ML(20 MG) UNDER THE SKIN 1 TIME A WEEK 03/01/19  Yes Deveshwar, Abel Presto, MD  montelukast (SINGULAIR) 10 MG tablet Take 10 mg by mouth at bedtime.   Yes [provider]  PAZEO 0.7 % SOLN INT 1 GTT INTO  OU QAM 09/13/18  Yes [provider]  RESTASIS 0.05 % ophthalmic emulsion INT 1 GTT INTO  OU BID 09/13/18  Yes [provider]  sertraline (ZOLOFT) 100 MG tablet Take 100 mg daily by mouth. 12/16/16  Yes [provider]  traZODone (DESYREL) 50 MG tablet Take 100 mg by mouth at bedtime as needed.  04/11/15   [provider]  triamcinolone cream (KENALOG) 0.1 % Apply 1 application topically as needed.  11/13/16   [provider]  TUBERCULIN SYR 1CC/27GX1/2" (B-D TB SYRINGE 1CC/27GX1/2") 27G X 1/2" 1 ML MISC 12 Syringes by Does not apply route once a week. 11/03/18   Bo Merino, MD    Family History Family History  Problem Relation Age of Onset  . Rheum arthritis Mother   . Migraines Mother   . Hypertension Mother   . Colitis Mother   . Rheum arthritis Maternal Grandmother   . Rheum arthritis Maternal Aunt   . Migraines Sister   . Migraines Brother   . Sickle cell anemia Daughter   . Bipolar disorder Daughter   . Anxiety disorder Daughter   . Depression Daughter   . Migraines Daughter   . Arrhythmia Daughter   . Migraines Son   . ADD / ADHD Son   . Seizures Son   . Arrhythmia Son     Social History Social History   Tobacco Use  . Smoking status: Current Some Day Smoker    Packs/day: 0.50    Years: 19.00    Pack years: 9.50    Types: Cigars  . Smokeless tobacco: Never Used  . Tobacco comment: former cigarette smoker    Substance Use Topics  . Alcohol use: Yes    Alcohol/week: 0.0 standard drinks    Comment: occasional  . Drug use: No     Allergies   Fluoxetine, Humira [adalimumab], Hydrocodone-acetaminophen, Hydroxychloroquine, Other, and Vicodin [hydrocodone-acetaminophen]   Review of Systems Review of Systems  Constitutional: Positive for chills, fatigue and fever.  HENT: Negative for congestion, ear pain, sinus pressure, sinus pain and sore throat.   Eyes: Negative for pain and  visual disturbance.  Respiratory: Negative for cough, chest tightness and shortness of breath.   Cardiovascular: Negative for chest pain and palpitations.  Gastrointestinal: Negative for abdominal pain, diarrhea, nausea and vomiting.  Genitourinary: Negative for dysuria, hematuria and urgency.  Musculoskeletal: Positive for arthralgias, back pain and myalgias.  Skin: Negative for color change and rash.  Neurological: Positive for headaches. Negative for seizures and syncope.  All other systems reviewed and are negative.    Physical Exam Triage Vital Signs ED Triage Vitals  Enc Vitals Group     BP      Pulse      Resp      Temp      Temp src      SpO2      Weight      Height      Head Circumference      Peak Flow      Pain Score      Pain Loc      Pain Edu?      Excl. in [place]?    No data found.  Updated Vital Signs BP (!) 136/94 (BP Location: Left Arm) Comment: second attempt, has not had blood pressure medicine  Pulse 92   Temp 99.2 F (37.3 C) Comment: tylenol at 6:00am  Resp (!) 22   SpO2 99%   Visual Acuity Right Eye Distance:   Left Eye Distance:   Bilateral Distance:    Right Eye Near:   Left Eye Near:    Bilateral Near:     Physical Exam Vitals and nursing note reviewed.  Constitutional:      General: She is not in acute distress.    Appearance: She is well-developed. She is ill-appearing.  HENT:     Head: Normocephalic and atraumatic.  Eyes:     General: No scleral  icterus.    Conjunctiva/sclera: Conjunctivae normal.     Pupils: Pupils are equal, round, and reactive to light.  Cardiovascular:     Rate and Rhythm: Normal rate and regular rhythm.     Heart sounds: No murmur. No friction rub. No gallop.   Pulmonary:     Effort: Pulmonary effort is normal. No respiratory distress.     Breath sounds: Normal breath sounds. No wheezing, rhonchi or rales.  Abdominal:     Palpations: Abdomen is soft.     Tenderness: There is no abdominal tenderness.  Musculoskeletal:     Cervical back: Neck supple.  Skin:    General: Skin is warm and dry.  Neurological:     General: No focal deficit present.     Mental Status: She is alert and oriented to person, place, and time.  Psychiatric:        Mood and Affect: Mood normal.        Behavior: Behavior normal.        Thought Content: Thought content normal.        Judgment: Judgment normal.      UC Treatments / Results  Labs (all labs ordered are listed, but only abnormal results are displayed) Labs Reviewed  POC SARS CORONAVIRUS 2 AG - Abnormal; Notable for the following components:      Result Value   SARS Coronavirus 2 Ag POSITIVE (*)    All other components within normal limits  POC SARS CORONAVIRUS 2 AG -  ED - Abnormal; Notable for the following components:   SARS Coronavirus 2 Ag POSITIVE (*)    All other components within normal limits  POC SARS CORONAVIRUS 2 AG -  ED  POC SARS CORONAVIRUS 2 AG  POC SARS CORONAVIRUS 2 AG -  ED    EKG   Radiology No results found.  Procedures Procedures (including critical care time)  Medications Ordered in UC Medications - No data to display  Initial Impression / Assessment and Plan / UC Course  I have reviewed the triage vital signs and the nursing notes.  Pertinent labs & imaging results that were available during my care of the patient were reviewed by me and considered in my medical decision making (see chart for details).     #COVID - Rapid  positive in clinic. Infusion clinic referral called in. Home monitoring ordered. Discussed need to closely monitor any progression in symptoms given immunosuppressive therapies. Sympotmatic care for now given minimal symptoms outside of fever and bodyache. Recommended follow up with primary care if symptoms progress. ED precautions discussed. - discussed typo in directions and update that it is 10 days from positive test date.  Final Clinical Impressions(s) / UC Diagnoses   Final diagnoses:  [id number]     Discharge Instructions     You have tested positive for COVID-19 by way of our Rapid Antigen test. You will need to quarantine at this time.   The CDC Guidelines state you may stop quarantine once the below have been met. - It has been 10 days from the onset of symptoms  - You have had no fever for 24 consecutive hours without the use of fever reducing medications such as tylenol or advil - any  Covid related symptoms are improving.  - if you are not feeling better at day 10, please continue to monitor symptoms for improvement and reach out to your Primary care provider to discuss when to discontinue isolation   Take tylenol 34m tablet x 2 up to every 6 hours for sore throat, fever and body aches. Do not exceed 8 tablets in 24 hours  Go directly to the Emergency Department or call 911 if you have severe chest pain, shortness of breath, severe diarrhea or feel as though you might pass out.         ED Prescriptions    None     PDMP not reviewed this encounter.   DPurnell Shoemaker PA-C 03/21/19 1023

## 2019-03-22 ENCOUNTER — Telehealth: Payer: Self-pay | Admitting: Rheumatology

## 2019-03-22 MED ORDER — PREDNISONE 5 MG PO TABS: ORAL_TABLET | ORAL | 0 refills | Status: DC

## 2019-03-22 NOTE — Telephone Encounter (Signed)
Patient calling to see if she can get something for pain. Patient hurting, ankles swollen, and she tested positive for COVID. Patient was advised to stop medication, and having a lot of pain now. Patient uses [facility] on [address]. Please call to advise.

## 2019-03-22 NOTE — Telephone Encounter (Signed)
Please send a prednisone taper starting at 20 mg and then taper by 5 mg every 4 days.

## 2019-03-22 NOTE — Telephone Encounter (Signed)
Patient advised she is having swelling and pain in her feet and ankle. Patient states she was diagnosed with Covid on 03/21/19. Patient is aware to hold MTX and Enbrel.  Patient states she is taking Tylenol and she is not getting any relief.  Patient would like to know what she may do for the pain and swelling in her feet and ankles. Please advise.

## 2019-03-22 NOTE — Telephone Encounter (Signed)
Reviewing patient's chart she was diagnosed with Covid on 03/20/18. Patient advised to hold MTX and Enbrel for 4 weeks. Patient advised if she is still symptomatic she will need to continue to hold her medications. Patient advised to call the office if she needs anything.

## 2019-03-22 NOTE — Telephone Encounter (Signed)
Prescription sent to the pharmacy.

## 2019-03-24 ENCOUNTER — Ambulatory Visit (HOSPITAL_COMMUNITY)
Admission: RE | Admit: 2019-03-24 | Discharge: 2019-03-24 | Disposition: A | Discharge status: Home / Self Care | Payer: Medicare Other | Source: Ambulatory Visit | Attending: Pulmonary Disease | Admitting: Pulmonary Disease

## 2019-03-24 DIAGNOSIS — Z23 Encounter for immunization: Secondary | ICD-10-CM | POA: Diagnosis not present

## 2019-03-24 DIAGNOSIS — U071 COVID-19: Secondary | ICD-10-CM | POA: Insufficient documentation

## 2019-03-24 DIAGNOSIS — Z79899 Other long term (current) drug therapy: Secondary | ICD-10-CM | POA: Diagnosis present

## 2019-03-24 MED ORDER — ALBUTEROL SULFATE HFA 108 (90 BASE) MCG/ACT IN AERS: 2.0000 | INHALATION_SPRAY | Freq: Once | RESPIRATORY_TRACT | Status: DC | PRN

## 2019-03-24 MED ORDER — EPINEPHRINE 0.3 MG/0.3ML IJ SOAJ: 0.3000 mg | Freq: Once | INTRAMUSCULAR | Status: DC | PRN

## 2019-03-24 MED ORDER — FAMOTIDINE IN NACL 20-0.9 MG/50ML-% IV SOLN: 20.0000 mg | Freq: Once | INTRAVENOUS | Status: DC | PRN

## 2019-03-24 MED ORDER — SODIUM CHLORIDE 0.9 % IV SOLN
INTRAVENOUS | Status: DC | PRN
  Administered 2019-03-24: 17:00:00 250 mL via INTRAVENOUS

## 2019-03-24 MED ORDER — METHYLPREDNISOLONE SODIUM SUCC 125 MG IJ SOLR: 125.0000 mg | Freq: Once | INTRAMUSCULAR | Status: DC | PRN

## 2019-03-24 MED ORDER — DIPHENHYDRAMINE HCL 50 MG/ML IJ SOLN: 50.0000 mg | Freq: Once | INTRAMUSCULAR | Status: DC | PRN

## 2019-03-24 MED ORDER — SODIUM CHLORIDE 0.9 % IV SOLN
700.0000 mg | Freq: Once | INTRAVENOUS | Status: AC
  Administered 2019-03-24: 700 mg via INTRAVENOUS
  Filled 2019-03-24: qty 20

## 2019-03-24 NOTE — Discharge Instructions (Signed)
COVID-19 COVID-19 is a respiratory infection that is caused by a virus called severe acute respiratory syndrome coronavirus 2 (SARS-CoV-2). The disease is also known as coronavirus disease or novel coronavirus. In some people, the virus may not cause any symptoms. In others, it may cause a serious infection. The infection can get worse quickly and can lead to complications, such as:  Pneumonia, or infection of the lungs.  Acute respiratory distress syndrome or ARDS. This is a condition in which fluid build-up in the lungs prevents the lungs from filling with air and passing oxygen into the blood.  Acute respiratory failure. This is a condition in which there is not enough oxygen passing from the lungs to the body or when carbon dioxide is not passing from the lungs out of the body.  Sepsis or septic shock. This is a serious bodily reaction to an infection.  Blood clotting problems.  Secondary infections due to bacteria or fungus.  Organ failure. This is when your body's organs stop working. The virus that causes COVID-19 is contagious. This means that it can spread from person to person through droplets from coughs and sneezes (respiratory secretions). What are the causes? This illness is caused by a virus. You may catch the virus by:  Breathing in droplets from an infected person. Droplets can be spread by a person breathing, speaking, singing, coughing, or sneezing.  Touching something, like a table or a doorknob, that was exposed to the virus (contaminated) and then touching your mouth, nose, or eyes. What increases the risk? Risk for infection You are more likely to be infected with this virus if you:  Are within 6 feet (2 meters) of a person with COVID-19.  Provide care for or live with a person who is infected with COVID-19.  Spend time in crowded indoor spaces or live in shared housing. Risk for serious illness You are more likely to become seriously ill from the virus if  you:  Are 50 years of age or older. The higher your age, the more you are at risk for serious illness.  Live in a nursing home or long-term care facility.  Have cancer.  Have a long-term (chronic) disease such as: ? Chronic lung disease, including chronic obstructive pulmonary disease or asthma. ? A long-term disease that lowers your body's ability to fight infection (immunocompromised). ? Heart disease, including heart failure, a condition in which the arteries that lead to the heart become narrow or blocked (coronary artery disease), a disease which makes the heart muscle thick, weak, or stiff (cardiomyopathy). ? Diabetes. ? Chronic kidney disease. ? Sickle cell disease, a condition in which red blood cells have an abnormal "sickle" shape. ? Liver disease.  Are obese. What are the signs or symptoms? Symptoms of this condition can range from mild to severe. Symptoms may appear any time from 2 to 14 days after being exposed to the virus. They include:  A fever or chills.  A cough.  Difficulty breathing.  Headaches, body aches, or muscle aches.  Runny or stuffy (congested) nose.  A sore throat.  New loss of taste or smell. Some people may also have stomach problems, such as nausea, vomiting, or diarrhea. Other people may not have any symptoms of COVID-19. How is this diagnosed? This condition may be diagnosed based on:  Your signs and symptoms, especially if: ? You live in an area with a COVID-19 outbreak. ? You recently traveled to or from an area where the virus is common. ? You   provide care for or live with a person who was diagnosed with COVID-19. ? You were exposed to a person who was diagnosed with COVID-19.  A physical exam.  Lab tests, which may include: ? Taking a sample of fluid from the back of your nose and throat (nasopharyngeal fluid), your nose, or your throat using a swab. ? A sample of mucus from your lungs (sputum). ? Blood tests.  Imaging tests,  which may include, X-rays, CT scan, or ultrasound. How is this treated? At present, there is no medicine to treat COVID-19. Medicines that treat other diseases are being used on a trial basis to see if they are effective against COVID-19. Your health care provider will talk with you about ways to treat your symptoms. For most people, the infection is mild and can be managed at home with rest, fluids, and over-the-counter medicines. Treatment for a serious infection usually takes places in a hospital intensive care unit (ICU). It may include one or more of the following treatments. These treatments are given until your symptoms improve.  Receiving fluids and medicines through an IV.  Supplemental oxygen. Extra oxygen is given through a tube in the nose, a face mask, or a hood.  Positioning you to lie on your stomach (prone position). This makes it easier for oxygen to get into the lungs.  Continuous positive airway pressure (CPAP) or bi-level positive airway pressure (BPAP) machine. This treatment uses mild air pressure to keep the airways open. A tube that is connected to a motor delivers oxygen to the body.  Ventilator. This treatment moves air into and out of the lungs by using a tube that is placed in your windpipe.  Tracheostomy. This is a procedure to create a hole in the neck so that a breathing tube can be inserted.  Extracorporeal membrane oxygenation (ECMO). This procedure gives the lungs a chance to recover by taking over the functions of the heart and lungs. It supplies oxygen to the body and removes carbon dioxide. Follow these instructions at home: Lifestyle  If you are sick, stay home except to get medical care. Your health care provider will tell you how long to stay home. Call your health care provider before you go for medical care.  Rest at home as told by your health care provider.  Do not use any products that contain nicotine or tobacco, such as cigarettes,  e-cigarettes, and chewing tobacco. If you need help quitting, ask your health care provider.  Return to your normal activities as told by your health care provider. Ask your health care provider what activities are safe for you. General instructions  Take over-the-counter and prescription medicines only as told by your health care provider.  Drink enough fluid to keep your urine pale yellow.  Keep all follow-up visits as told by your health care provider. This is important. How is this prevented?  There is no vaccine to help prevent COVID-19 infection. However, there are steps you can take to protect yourself and others from this virus. To protect yourself:   Do not travel to areas where COVID-19 is a risk. The areas where COVID-19 is reported change often. To identify high-risk areas and travel restrictions, check the CDC travel website: wwwnc.cdc.gov/travel/notices  If you live in, or must travel to, an area where COVID-19 is a risk, take precautions to avoid infection. ? Stay away from people who are sick. ? Wash your hands often with soap and water for 20 seconds. If soap and water   are not available, use an alcohol-based hand sanitizer. ? Avoid touching your mouth, face, eyes, or nose. ? Avoid going out in public, follow guidance from your state and local health authorities. ? If you must go out in public, wear a cloth face covering or face mask. Make sure your mask covers your nose and mouth. ? Avoid crowded indoor spaces. Stay at least 6 feet (2 meters) away from others. ? Disinfect objects and surfaces that are frequently touched every day. This may include:  Counters and tables.  Doorknobs and light switches.  Sinks and faucets.  Electronics, such as phones, remote controls, keyboards, computers, and tablets. To protect others: If you have symptoms of COVID-19, take steps to prevent the virus from spreading to others.  If you think you have a COVID-19 infection, contact  your health care provider right away. Tell your health care team that you think you may have a COVID-19 infection.  Stay home. Leave your house only to seek medical care. Do not use public transport.  Do not travel while you are sick.  Wash your hands often with soap and water for 20 seconds. If soap and water are not available, use alcohol-based hand sanitizer.  Stay away from other members of your household. Let healthy household members care for children and pets, if possible. If you have to care for children or pets, wash your hands often and wear a mask. If possible, stay in your own room, separate from others. Use a different bathroom.  Make sure that all people in your household wash their hands well and often.  Cough or sneeze into a tissue or your sleeve or elbow. Do not cough or sneeze into your hand or into the air.  Wear a cloth face covering or face mask. Make sure your mask covers your nose and mouth. Where to find more information  Centers for Disease Control and Prevention: www.cdc.gov/coronavirus/2019-ncov/index.html  World Health Organization: www.who.int/health-topics/coronavirus Contact a health care provider if:  You live in or have traveled to an area where COVID-19 is a risk and you have symptoms of the infection.  You have had contact with someone who has COVID-19 and you have symptoms of the infection. Get help right away if:  You have trouble breathing.  You have pain or pressure in your chest.  You have confusion.  You have bluish lips and fingernails.  You have difficulty waking from sleep.  You have symptoms that get worse. These symptoms may represent a serious problem that is an emergency. Do not wait to see if the symptoms will go away. Get medical help right away. Call your local emergency services (911 in the [place] Do not drive yourself to the hospital. Let the emergency medical personnel know if you think you have  COVID-19. Summary  COVID-19 is a respiratory infection that is caused by a virus. It is also known as coronavirus disease or novel coronavirus. It can cause serious infections, such as pneumonia, acute respiratory distress syndrome, acute respiratory failure, or sepsis.  The virus that causes COVID-19 is contagious. This means that it can spread from person to person through droplets from breathing, speaking, singing, coughing, or sneezing.  You are more likely to develop a serious illness if you are 50 years of age or older, have a weak immune system, live in a nursing home, or have chronic disease.  There is no medicine to treat COVID-19. Your health care provider will talk with you about ways to treat your symptoms.    Take steps to protect yourself and others from infection. Wash your hands often and disinfect objects and surfaces that are frequently touched every day. Stay away from people who are sick and wear a mask if you are sick. This information is not intended to replace advice given to you by your health care provider. Make sure you discuss any questions you have with your health care provider. Document Revised: 12/10/2018 Document Reviewed: 03/18/2018 Elsevier Patient Education  2020 [employer] Inc. What types of side effects do monoclonal antibody drugs cause?  Common side effects  In general, the more common side effects caused by monoclonal antibody drugs include: . Allergic reactions, such as hives or itching . Flu-like signs and symptoms, including chills, fatigue, fever, and muscle aches and pains . Nausea, vomiting . Diarrhea . Skin rashes . Low blood pressure   The CDC is recommending patients who receive monoclonal antibody treatments wait at least 90 days before being vaccinated.  Currently, there are no data on the safety and efficacy of mRNA COVID-19 vaccines in persons who received monoclonal antibodies or convalescent plasma as part of COVID-19 treatment. Based  on the estimated half-life of such therapies as well as evidence suggesting that reinfection is uncommon in the 90 days after initial infection, vaccination should be deferred for at least 90 days, as a precautionary measure until additional information becomes available, to avoid interference of the antibody treatment with vaccine-induced immune responses. 

## 2019-03-24 NOTE — Progress Notes (Signed)
  Diagnosis: COVID-19  Physician:dr wright  Procedure: [facility] Med: bamlanivimab infusion - Provided patient with bamlanimivab fact sheet for patients, parents and caregivers prior to infusion.  Complications: No immediate complications noted.  Discharge: Discharged home   [place] 03/24/2019

## 2019-04-20 ENCOUNTER — Other Ambulatory Visit: Payer: Self-pay | Admitting: Rheumatology

## 2019-04-20 MED ORDER — METHOTREXATE SODIUM CHEMO INJECTION 50 MG/2ML: INTRAMUSCULAR | 0 refills | Status: DC

## 2019-04-20 NOTE — Telephone Encounter (Signed)
Patient states she she was dx with Covid on 03/21/19. Patient states she is no longer having Covid symptoms. Patient states she is having allergy symptoms sneezing, runny/stuffy nose and watery eyes. Patient when she may restart her MTX and Enbrel. Please advise.

## 2019-04-20 NOTE — Telephone Encounter (Signed)
Patient called requesting a return call to let her know when she can restart her Methotrexate and Enbrel.  Patient states she needs a refill of her Methotrexate, but still has 4 pens left of her Enbrel.

## 2019-04-20 NOTE — Telephone Encounter (Addendum)
Yes that should be okay to restart methotrexate and Enbrel. Patient states she needs a refill on the MTX. Patient advised she is due to update labs. Patient states she had some done at another office recently and will have them faxed to our office.   Last Visit: 01/28/19  Next Visit: due April 2021.  Labs: 10/28/18 Absolute monocyte is borderline low. Rest of CBC WNL. CMP WNL  Okay to refill 30 day supply MTX?

## 2019-04-20 NOTE — Telephone Encounter (Signed)
Ok to refill 30 day supply of MTX

## 2019-04-20 NOTE — Telephone Encounter (Signed)
Yes that should be okay to restart methotrexate and Enbrel.

## 2019-05-04 MED FILL — ENBREL SURECLICK 50 MG/ML S: 50 | 28 days supply | Qty: 4 | Fill #1

## 2019-05-17 ENCOUNTER — Telehealth: Payer: Self-pay | Admitting: Pharmacy Technician

## 2019-05-17 NOTE — Telephone Encounter (Signed)
Received notification from [facility] regarding a prior authorization for ENBREL. Authorization has been APPROVED from 05/17/19 to 05/16/20.   Authorization # [id number]  8:42 AM Beatriz Chancellor, CPhT

## 2019-05-17 NOTE — Telephone Encounter (Signed)
Submitted a Prior Authorization request to [facility] for ENBREL via Cover My Meds. Will update once we receive a response.  (KeyMcneil Sober) - [id number]

## 2019-05-23 MED FILL — ENBREL SURECLICK 50 MG/ML S: 50 | 28 days supply | Qty: 4 | Fill #1

## 2019-06-23 MED FILL — ENBREL SURECLICK 50 MG/ML S: 50 | 28 days supply | Qty: 4 | Fill #2

## 2019-07-27 NOTE — Progress Notes (Signed)
Office Visit Note  Patient: Elizabeth Pope             Date of Birth: 25-Oct-1980           MRN: [id number]             PCP: Kristie Cowman, MD Referring: Kristie Cowman, MD Visit Date: 07/28/2019 Occupation: '@GUAROCC'$ @  Subjective:  Pain in both ankle joints   History of Present Illness: Elizabeth Pope is a 39 y.o. female with history of seropositive rheumatoid arthritis and osteoarthritis.  Patient is on Enbrel 50 mg subcutaneous injections once weekly, methotrexate 0.8 mL subcutaneous injections once weekly, and folic acid 2 mg by mouth daily.  She has not missed any doses of these medications recently.  She states that she does need a refill of methotrexate and update lab work.  Patient reports that she continues to have recurrent flares.  Her last prednisone taper was in January 2021.  She states that for the past 1 week she has been experiencing pain and inflammation in both ankle joints. She has noticed stiffness in her toes.  She has been wearing a left ankle joint brace for compression.  She states she dropped something on the top of her right foot earlier today and has had tenderness since then.  She has been able to bear full weight.  She states that she has intermittent lower back pain which seems to be muscular.  She states that she sleeps with a pillow under her back and is unsure if it is making it better or worse.  She denies any symptoms of radiculopathy.  She denies any other joint pain or joint swelling. She denies any recent infections.  She is apprehensive to receive the COVID-19 vaccination.    Activities of Daily Living:  Patient reports morning stiffness for several hours.   Patient Reports nocturnal pain.  Difficulty dressing/grooming: Denies Difficulty climbing stairs: Reports Difficulty getting out of chair: Reports Difficulty using hands for taps, buttons, cutlery, and/or writing: Reports  Review of Systems  Constitutional: Positive for fatigue.    HENT: Positive for mouth dryness and nose dryness. Negative for mouth sores.   Eyes: Positive for dryness. Negative for pain, itching and visual disturbance.  Respiratory: Negative for cough, hemoptysis, shortness of breath and difficulty breathing.   Cardiovascular: Negative for chest pain, palpitations, hypertension and swelling in legs/feet.  Gastrointestinal: Positive for abdominal pain. Negative for blood in stool, constipation and diarrhea.  Endocrine: Negative for increased urination.  Genitourinary: Negative for difficulty urinating and painful urination.  Musculoskeletal: Positive for arthralgias, joint pain, joint swelling, myalgias, morning stiffness, muscle tenderness and myalgias. Negative for muscle weakness.  Skin: Negative for color change, pallor, rash, hair loss, nodules/bumps, redness, skin tightness, ulcers and sensitivity to sunlight.  Allergic/Immunologic: Negative for susceptible to infections.  Neurological: Negative for dizziness, numbness, headaches, memory loss and weakness.  Hematological: Negative for bruising/bleeding tendency and swollen glands.  Psychiatric/Behavioral: Positive for sleep disturbance. Negative for depressed mood and confusion. The patient is not nervous/anxious.     PMFS History:  Patient Active Problem List   Diagnosis Date Noted  . Asthma with acute exacerbation 09/12/2018  . 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 Pelvic pain 06/02/2017  . Atypical squamous cell changes of undetermined significance (ASCUS) on cervical cytology with negative high risk human papilloma virus (HPV) test result 02/03/2017  . Encounter for IUD insertion 01/28/2017  . Uterine fibroid 01/21/2017  . H/O tubal ligation 06/24/2016  .  High risk medication use 01/07/2016  . GERD (gastroesophageal reflux disease) 12/25/2015  . Migraines 12/25/2015  . Hypertension 12/25/2015  . RA (rheumatoid arthritis) ([place]) 12/25/2015  . DDD (degenerative disc  disease), cervical 12/25/2015  . Osteoarthritis of both hands 12/25/2015  . 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/2015  . 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 Bipolar 2 disorder ([place]) 05/14/2015  . Cigarette smoker 05/05/2015  . Chest pain 05/05/2015  . Solitary pulmonary nodule 05/04/2015    Past Medical History:  Diagnosis Date  . Anxiety   . Bipolar 1 disorder ([place])   . DDD (degenerative disc disease), cervical 12/25/2015  . Depression   . GERD (gastroesophageal reflux disease) 12/25/2015  . Hypertension 12/25/2015  . Migraine   . Migraines 12/25/2015  . 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/2015   mild  . Osteoarthritis of both hands 12/25/2015  . RA (rheumatoid arthritis) (HCC) 12/25/2015   Positive RF, Elevated ESR, Positive CCP > 250     Family History  Problem Relation Age of Onset  . Rheum arthritis Mother   . Migraines Mother   . Hypertension Mother   . Colitis Mother   . Rheum arthritis Maternal Grandmother   . Rheum arthritis Maternal Aunt   . Migraines Sister   . Migraines Brother   . Sickle cell anemia Daughter   . Bipolar disorder Daughter   . Anxiety disorder Daughter   . Depression Daughter   . Migraines Daughter   . Arrhythmia Daughter   . Migraines Son   . ADD / ADHD Son   . Seizures Son   . Arrhythmia Son    Past Surgical History:  Procedure Laterality Date  . CESAREAN SECTION    . TUBAL LIGATION     Social History   Social History Narrative  . Not on file    There is no immunization history on file for this patient.   Objective: Vital Signs: BP (!) 145/88 (BP Location: Right Arm, Patient Position: Sitting, Cuff Size: Normal)   Pulse 72   Resp 14   Ht 5' 4"  (1.626 m)   Wt 177 lb 6.4 oz (80.5 kg)   BMI 30.45 kg/m    Physical Exam Vitals and nursing note reviewed.  Constitutional:      Appearance: She is well-developed.  HENT:     Head: Normocephalic and atraumatic.  Eyes:      Conjunctiva/sclera: Conjunctivae normal.  Pulmonary:     Effort: Pulmonary effort is normal.  Abdominal:     General: Bowel sounds are normal.     Palpations: Abdomen is soft.  Musculoskeletal:     Cervical back: Normal range of motion.  Lymphadenopathy:     Cervical: No cervical adenopathy.  Skin:    General: Skin is warm and dry.     Capillary Refill: Capillary refill takes less than 2 seconds.  Neurological:     Mental Status: She is alert and oriented to person, place, and time.  Psychiatric:        Behavior: Behavior normal.      Musculoskeletal Exam: C-spine, thoracic spine, and lumbar spine good ROM.  No midline spinal tenderness.  No SI joint tenderness.  Shoulder joints, elbow joints, MCPs, PIPs, and DIPs goo ROM with no synovitis. Slightly limited ROM of both wrist joints but no tenderness or inflammation was noted. Complete fist formation bilaterally. Knee joints have good ROM with no warmth or effusion.  Tenderness of both ankle joints noted.  Tenderness on the dorsal aspect of the right foot.  No tenderness of MTP joints.   CDAI Exam: CDAI Score: 0.6  Patient Global: 4 mm; Provider Global: 2 mm Swollen: 0 ; Tender: 3  Joint Exam 07/28/2019      Right  Left  Ankle   Tender   Tender  Tarsometatarsal   Tender      There is currently no information documented on the homunculus. Go to the Rheumatology activity and complete the homunculus joint exam.  Investigation: No additional findings.  Imaging: XR Foot 2 Views Left  Result Date: 07/28/2019 No MCP, PIP or DIP narrowing was noted.  No intertarsal or tibiotalar joint space narrowing was noted.  No erosive changes were noted.  No radiographic progression was noted when compared to the films of 2018. Impression: Unremarkable x-ray of the foot.  XR Foot 2 Views Right  Result Date: 07/28/2019 No MCP, PIP or DIP narrowing was noted.  No intertarsal or tibiotalar joint space narrowing was noted.  No erosive changes were  noted.  No radiographic progression was noted when compared to the films of 2018. Impression: Unremarkable x-ray of the foot.  XR Hand 2 View Left  Result Date: 07/28/2019 Juxta-articular osteopenia was noted.  Mild intercarpal and radiocarpal joint space narrowing was noted.  No MCP, PIP or DIP narrowing was noted.  No erosive changes were noted.  No radiographic progression was noted when compared to the x-rays of 2018. Impression: These findings are consistent with rheumatoid arthritis.  XR Hand 2 View Right  Result Date: 07/28/2019 Juxta-articular osteopenia was noted.  Mild intercarpal and radiocarpal joint space narrowing was noted.  No MCP, PIP or DIP narrowing was noted.  No erosive changes were noted.  No radiographic progression was noted when compared to the x-rays of 2018. Impression: These findings are consistent with rheumatoid arthritis.   Recent Labs: Lab Results  Component Value Date   WBC 5.5 10/28/2018   HGB 14.8 10/28/2018   PLT 311 10/28/2018   NA 139 10/28/2018   K 4.3 10/28/2018   CL 107 10/28/2018   CO2 24 10/28/2018   GLUCOSE 94 10/28/2018   BUN 14 10/28/2018   CREATININE 0.94 10/28/2018   BILITOT 0.4 10/28/2018   ALKPHOS 49 09/09/2016   AST 22 10/28/2018   ALT 26 10/28/2018   PROT 7.5 10/28/2018   ALBUMIN 4.1 09/09/2016   CALCIUM 9.8 10/28/2018   GFRAA 89 10/28/2018   QFTBGOLDPLUS NEGATIVE 10/28/2018    Speciality Comments: PLQ Eye Exam: WNL 03/24/17 @ [facility] Prior therapy: Humira (facial rash)  Procedures:  No procedures performed Allergies: Fluoxetine, Humira [adalimumab], Hydrocodone-acetaminophen, Hydroxychloroquine, Other, and Vicodin [hydrocodone-acetaminophen]   Assessment / Plan:     Visit Diagnoses: Rheumatoid arthritis involving multiple sites with positive rheumatoid factor (HCC) -  +CCP, +RF: She presents today with tenderness of both ankle joints.  No warmth or effusion was noted. She has been having discomfort and intermittent  inflammation in both ankle joints for the past 1 week.  She has been walking more and has been under increased stress over the past several weeks, which she attributes to the increased pain and inflammation.  She is not having any discomfort in her hands or wrist joints at this time.  She has overall been doing well on Enbrel 50 mg sq injections once weekly, MTX 0.8 ml sq once weekly, and folic acid 2 mg po daily.  Her last prednisone taper was in January 2021.  She does not  require prednisone today. X-rays of both hands and feet were updated today and no radiographic progression since 09/09/16 was noted.  She will continue on the current treatment regimen.  We will send in a refill of MTX once her lab work has resulted.  She was advised to notify us if she continues to have recurrent flares.  She will follow up in 5 months. - Plan: XR Hand 2 View Left, XR Hand 2 View Right, XR Foot 2 Views Left, XR Foot 2 Views Right  High risk medication use -  Enbrel 50 mg sq weekly injections, MTX 0.8 ml sq once weekly, and folic acid 2 mg po daily. (Humira caused facial rash).  CBC and CMP are overdue.  Lab orders were released today.  She will return for lab work in September and every 3 months to monitor for drug toxicity.  Standing orders for CBC and CMP were placed today.  TB gold was negative on 10/28/18.  Future order for TB gold was placed today.  - Plan: CBC with Differential/Platelet, COMPLETE METABOLIC PANEL WITH GFR, QuantiFERON-TB Gold Plus  Primary osteoarthritis of both hands: She has mild osteoarthritic changes.  No tenderness or inflammation was noted.  She has complete fist formation bilaterally.  Joint protection and muscle strengthening were discussed.   Primary osteoarthritis of both knees: She has good ROM of both knee joints.  No warmth or effusion noted.   Chondromalacia of both patellae: She has good ROM of both knees.   Primary osteoarthritis of both feet: She has been having increased increased  discomfort in both feet and ankle joints for the past 1 week.  She has been walking more over the last several weeks, which she feels has contributed to her increased discomfort.  She has tenderness of both ankle joints on exam.  She has been wearing a left ankle joint brace for compression.  X-rays of both feet were obtained today and did not reveal any radiographic progression since 2018.  Pain in both feet -She has tenderness of both ankle joints on exam. She has been experiencing increased stiffness in both feet.  Today she dropped something on the top of her right foot and has point tenderness over the dorsal aspect.  X-rays of both feet were obtained today.  No radiographic or acute changes/fractures were noted.  Plan: XR Foot 2 Views Left, XR Foot 2 Views Right  DDD (degenerative disc disease), cervical: She has good ROM with no discomfort.  She has tenderness and muscle tension of the left trapezius muscle.  She was encouraged to have a massage and perform neck exercises.    Pain of paraspinal muscle: Lumbar region bilaterally.  She has been experiencing increased muscular lower back pain recently.  She has not had any injury or change in activity.  She has no midline spinal tenderness or SI joint tenderness on exam.  She was given a handout of back exercises and core strengthening exercises to perform.    Other medical conditions are listed as follows:   Essential hypertension  Solitary pulmonary nodule  Bipolar 2 disorder (HCC)  Cigarette smoker  TB lung, latent  History of migraine  History of gastroesophageal reflux (GERD)    Orders: Orders Placed This Encounter  Procedures  . XR Hand 2 View Left  . XR Hand 2 View Right  . XR Foot 2 Views Left  . XR Foot 2 Views Right  . CBC with Differential/Platelet  . COMPLETE METABOLIC PANEL WITH GFR  .  QuantiFERON-TB Gold Plus  . COMPLETE METABOLIC PANEL WITH GFR  . CBC with Differential/Platelet   No orders of the defined  types were placed in this encounter.    Follow-Up Instructions: Return in about 5 months (around 12/28/2019) for Rheumatoid arthritis.   Hazel Sams, PA-C  I examined and evaluated the patient with Hazel Sams PA.  Patient had tenderness on palpation over bilateral ankles and dorsum of her right foot.  She had recent trauma to her right foot.  No synovitis was noted on examination.  X-rays were unremarkable.  The plan of care was discussed as noted above.  Bo Merino, MD   Note - This record has been created using Editor, commissioning.  Chart creation errors have been sought, but may not always  have been located. Such creation errors do not reflect on  the standard of medical care.

## 2019-07-28 ENCOUNTER — Ambulatory Visit: Payer: Self-pay

## 2019-07-28 ENCOUNTER — Ambulatory Visit: Payer: Medicare Other

## 2019-07-28 ENCOUNTER — Ambulatory Visit (INDEPENDENT_AMBULATORY_CARE_PROVIDER_SITE_OTHER): Payer: Medicare Other | Admitting: Rheumatology

## 2019-07-28 ENCOUNTER — Encounter: Payer: Self-pay | Admitting: Rheumatology

## 2019-07-28 ENCOUNTER — Other Ambulatory Visit: Payer: Self-pay

## 2019-07-28 VITALS — BP 145/88 | HR 72 | Resp 14 | Ht 64.0 in | Wt 177.4 lb

## 2019-07-28 DIAGNOSIS — R911 Solitary pulmonary nodule: Secondary | ICD-10-CM

## 2019-07-28 DIAGNOSIS — M19041 Primary osteoarthritis, right hand: Secondary | ICD-10-CM

## 2019-07-28 DIAGNOSIS — Z8669 Personal history of other diseases of the nervous system and sense organs: Secondary | ICD-10-CM

## 2019-07-28 DIAGNOSIS — M19042 Primary osteoarthritis, left hand: Secondary | ICD-10-CM

## 2019-07-28 DIAGNOSIS — F17210 Nicotine dependence, cigarettes, uncomplicated: Secondary | ICD-10-CM

## 2019-07-28 DIAGNOSIS — M19071 Primary osteoarthritis, right ankle and foot: Secondary | ICD-10-CM

## 2019-07-28 DIAGNOSIS — M170 Bilateral primary osteoarthritis of knee: Secondary | ICD-10-CM | POA: Diagnosis not present

## 2019-07-28 DIAGNOSIS — M5030 Other cervical disc degeneration, unspecified cervical region: Secondary | ICD-10-CM

## 2019-07-28 DIAGNOSIS — I10 Essential (primary) hypertension: Secondary | ICD-10-CM

## 2019-07-28 DIAGNOSIS — M7918 Myalgia, other site: Secondary | ICD-10-CM

## 2019-07-28 DIAGNOSIS — M0579 Rheumatoid arthritis with rheumatoid factor of multiple sites without organ or systems involvement: Secondary | ICD-10-CM

## 2019-07-28 DIAGNOSIS — M79672 Pain in left foot: Secondary | ICD-10-CM

## 2019-07-28 DIAGNOSIS — Z227 Latent tuberculosis: Secondary | ICD-10-CM

## 2019-07-28 DIAGNOSIS — Z79899 Other long term (current) drug therapy: Secondary | ICD-10-CM | POA: Diagnosis not present

## 2019-07-28 DIAGNOSIS — M19072 Primary osteoarthritis, left ankle and foot: Secondary | ICD-10-CM

## 2019-07-28 DIAGNOSIS — M2242 Chondromalacia patellae, left knee: Secondary | ICD-10-CM

## 2019-07-28 DIAGNOSIS — M79671 Pain in right foot: Secondary | ICD-10-CM

## 2019-07-28 DIAGNOSIS — F3181 Bipolar II disorder: Secondary | ICD-10-CM

## 2019-07-28 DIAGNOSIS — M2241 Chondromalacia patellae, right knee: Secondary | ICD-10-CM

## 2019-07-28 DIAGNOSIS — Z8719 Personal history of other diseases of the digestive system: Secondary | ICD-10-CM

## 2019-07-28 NOTE — Patient Instructions (Addendum)
Standing Labs We placed an order today for your standing lab work.    Please come back and get your standing labs in September and every 3 months   We have open lab daily Monday through Thursday from 8:30-12:30 PM and 1:30-4:30 PM and Friday from 8:30-12:30 PM and 1:30-4:00 PM at the office of Dr. Bo Merino.   You may experience shorter wait times on Monday and Friday afternoons. The office is located at [address], [place], [place], [place] [address] No appointment is necessary.   Labs are drawn by [facility].  You may receive a bill from [facility] for your lab work.  If you wish to have your labs drawn at another location, please call the office 24 hours in advance to send orders.  If you have any questions regarding directions or hours of operation,  please call [phone].   Just as a reminder please drink plenty of water prior to coming for your lab work. Thanks!   Core Strength Exercises  Core exercises help to build strength in the muscles between your ribs and your hips (abdominal muscles). These muscles help to support your body and keep your spine stable. It is important to maintain strength in your core to prevent injury and pain. Some activities, such as yoga and Pilates, can help to strengthen core muscles. You can also strengthen core muscles with exercises at home. It is important to talk to your health care provider before you start a new exercise routine. What are the benefits of core strength exercises? Core strength exercises can:  Reduce back pain.  Help to rebuild strength after a back or spine injury.  Help to prevent injury during physical activity, especially injuries to the back and knees. How to do core strength exercises Repeat these exercises 10-15 times, or until you are tired. Do exercises exactly as told by your health care provider and adjust them as directed. It is normal to feel mild stretching, pulling, tightness, or discomfort as you do  these exercises. If you feel any pain while doing these exercises, stop. If your pain continues or gets worse when doing core exercises, contact your health care provider. You may want to use a padded yoga or exercise mat for strength exercises that are done on the floor. Bridging  1. Lie on your back on a firm surface with your knees bent and your feet flat on the floor. 2. Raise your hips so that your knees, hips, and shoulders form a straight line together. Keep your abdominal muscles tight. 3. Hold this position for 3-5 seconds. 4. Slowly lower your hips to the starting position. 5. Let your muscles relax completely between repetitions. Single-leg bridge 1. Lie on your back on a firm surface with your knees bent and your feet flat on the floor. 2. Raise your hips so that your knees, hips, and shoulders form a straight line together. Keep your abdominal muscles tight. 3. Lift one foot off the floor, then completely straighten that leg. 4. Hold this position for 3-5 seconds. 5. Put the straight leg back down in the bent position. 6. Slowly lower your hips to the starting position. 7. Repeat these steps using your other leg. Side bridge 1. Lie on your side with your knees bent. Prop yourself up on the elbow that is near the floor. 2. Using your abdominal muscles and your elbow that is on the floor, raise your body off the floor. Raise your hip so that your shoulder, hip, and foot  form a straight line together. 3. Hold this position for 10 seconds. Keep your head and neck raised and away from your shoulder (in their normal, neutral position). Keep your abdominal muscles tight. 4. Slowly lower your hip to the starting position. 5. Repeat and try to hold this position longer, working your way up to 30 seconds. Abdominal crunch 1. Lie on your back on a firm surface. Bend your knees and keep your feet flat on the floor. 2. Cross your arms over your chest. 3. Without bending your neck, tip your  chin slightly toward your chest. 4. Tighten your abdominal muscles as you lift your chest just high enough to lift your shoulder blades off of the floor. Do not hold your breath. You can do this with short lifts or long lifts. 5. Slowly return to the starting position. Bird dog 1. Get on your hands and knees, with your legs shoulder-width apart and your arms under your shoulders. Keep your back straight. 2. Tighten your abdominal muscles. 3. Raise one of your legs off the floor and straighten it. Try to keep it parallel to the floor. 4. Slowly lower your leg to the starting position. 5. Raise one of your arms off the floor and straighten it. Try to keep it parallel to the floor. 6. Slowly lower your arm to the starting position. 7. Repeat with the other arm and leg. If possible, try raising a leg and arm at the same time, on opposite sides of the body. For example, raise your left hand and your right leg. Plank 1. Lie on your belly. 2. Prop up your body onto your forearms and your feet, keeping your legs straight. Your body should make a straight line between your shoulders and feet. 3. Hold this position for 10 seconds while keeping your abdominal muscles tight. 4. Lower your body to the starting position. 5. Repeat and try to hold this position longer, working your way up to 30 seconds. Cross-core strengthening 1. Stand with your feet shoulder-width apart. 2. Hold a ball out in front of you. Keep your arms straight. 3. Tighten your abdominal muscles and slowly rotate at your waist from side to side. Keep your feet flat. 4. Once you are comfortable, try repeating this exercise with a heavier ball. Top core strengthening 1. Stand about 18 inches (46 cm) in front of a wall, with your back to the wall. 2. Keep your feet flat and shoulder-width apart. 3. Tighten your abdominal muscles. 4. Bend your hips and knees. 5. Slowly reach between your legs to touch the wall behind you. 6. Slowly  stand back up. 7. Raise your arms over your head and reach behind you. 8. Return to the starting position. General tips  Do not do any exercises that cause pain. If you have pain while exercising, talk to your health care provider.  Always stretch before and after doing these exercises. This can help prevent injury.  Maintain a healthy weight. Ask your health care provider what weight is healthy for you. Contact a health care provider if:  You have back pain that gets worse or does not go away.  You feel pain while doing core strength exercises. Get help right away if:  You have severe pain that does not get better with medicine. Summary  Core exercises help to build strength in the muscles between your ribs and your waist.  Core muscles help to support your body and keep your spine stable.  Some activities, such as yoga  and Pilates, can help to strengthen core muscles.  Core strength exercises can help back pain and can prevent injury.  If you feel any pain while doing core strength exercises, stop. This information is not intended to replace advice given to you by your health care provider. Make sure you discuss any questions you have with your health care provider. Document Revised: 06/02/2018 Document Reviewed: 07/02/2016 Elsevier Patient Education  [employer].  Back Exercises These exercises help to make your trunk and back strong. They also help to keep the lower back flexible. Doing these exercises can help to prevent back pain or lessen existing pain.  If you have back pain, try to do these exercises 2-3 times each day or as told by your doctor.  As you get better, do the exercises once each day. Repeat the exercises more often as told by your doctor.  To stop back pain from coming back, do the exercises once each day, or as told by your doctor. Exercises Single knee to chest Do these steps 3-5 times in a row for each leg: 1. Lie on your back on a firm bed  or the floor with your legs stretched out. 2. Bring one knee to your chest. 3. Grab your knee or thigh with both hands and hold them it in place. 4. Pull on your knee until you feel a gentle stretch in your lower back or buttocks. 5. Keep doing the stretch for 10-30 seconds. 6. Slowly let go of your leg and straighten it. Pelvic tilt Do these steps 5-10 times in a row: 1. Lie on your back on a firm bed or the floor with your legs stretched out. 2. Bend your knees so they point up to the ceiling. Your feet should be flat on the floor. 3. Tighten your lower belly (abdomen) muscles to press your lower back against the floor. This will make your tailbone point up to the ceiling instead of pointing down to your feet or the floor. 4. Stay in this position for 5-10 seconds while you gently tighten your muscles and breathe evenly. Cat-cow Do these steps until your lower back bends more easily: 1. Get on your hands and knees on a firm surface. Keep your hands under your shoulders, and keep your knees under your hips. You may put padding under your knees. 2. Let your head hang down toward your chest. Tighten (contract) the muscles in your belly. Point your tailbone toward the floor so your lower back becomes rounded like the back of a cat. 3. Stay in this position for 5 seconds. 4. Slowly lift your head. Let the muscles of your belly relax. Point your tailbone up toward the ceiling so your back forms a sagging arch like the back of a cow. 5. Stay in this position for 5 seconds.  Press-ups Do these steps 5-10 times in a row: 1. Lie on your belly (face-down) on the floor. 2. Place your hands near your head, about shoulder-width apart. 3. While you keep your back relaxed and keep your hips on the floor, slowly straighten your arms to raise the top half of your body and lift your shoulders. Do not use your back muscles. You may change where you place your hands in order to make yourself more  comfortable. 4. Stay in this position for 5 seconds. 5. Slowly return to lying flat on the floor.  Bridges Do these steps 10 times in a row: 1. Lie on your back on a firm  surface. 2. Bend your knees so they point up to the ceiling. Your feet should be flat on the floor. Your arms should be flat at your sides, next to your body. 3. Tighten your butt muscles and lift your butt off the floor until your waist is almost as high as your knees. If you do not feel the muscles working in your butt and the back of your thighs, slide your feet 1-2 inches farther away from your butt. 4. Stay in this position for 3-5 seconds. 5. Slowly lower your butt to the floor, and let your butt muscles relax. If this exercise is too easy, try doing it with your arms crossed over your chest. Belly crunches Do these steps 5-10 times in a row: 1. Lie on your back on a firm bed or the floor with your legs stretched out. 2. Bend your knees so they point up to the ceiling. Your feet should be flat on the floor. 3. Cross your arms over your chest. 4. Tip your chin a little bit toward your chest but do not bend your neck. 5. Tighten your belly muscles and slowly raise your chest just enough to lift your shoulder blades a tiny bit off of the floor. Avoid raising your body higher than that, because it can put too much stress on your low back. 6. Slowly lower your chest and your head to the floor. Back lifts Do these steps 5-10 times in a row: 1. Lie on your belly (face-down) with your arms at your sides, and rest your forehead on the floor. 2. Tighten the muscles in your legs and your butt. 3. Slowly lift your chest off of the floor while you keep your hips on the floor. Keep the back of your head in line with the curve in your back. Look at the floor while you do this. 4. Stay in this position for 3-5 seconds. 5. Slowly lower your chest and your face to the floor. Contact a doctor if:  Your back pain gets a lot worse  when you do an exercise.  Your back pain does not get better 2 hours after you exercise. If you have any of these problems, stop doing the exercises. Do not do them again unless your doctor says it is okay. Get help right away if:  You have sudden, very bad back pain. If this happens, stop doing the exercises. Do not do them again unless your doctor says it is okay. This information is not intended to replace advice given to you by your health care provider. Make sure you discuss any questions you have with your health care provider. Document Revised: 11/05/2017 Document Reviewed: 11/05/2017 Elsevier Patient Education  2020 [employer].

## 2019-07-29 LAB — COMPLETE METABOLIC PANEL WITH GFR
AG Ratio: 1.2 (calc) (ref 1.0–2.5)
ALT: 17 U/L (ref 6–29)
AST: 16 U/L (ref 10–30)
Albumin: 4.2 g/dL (ref 3.6–5.1)
Alkaline phosphatase (APISO): 43 U/L (ref 31–125)
BUN: 15 mg/dL (ref 7–25)
CO2: 24 mmol/L (ref 20–32)
Calcium: 10.4 mg/dL — ABNORMAL HIGH (ref 8.6–10.2)
Chloride: 104 mmol/L (ref 98–110)
Creat: 1.03 mg/dL (ref 0.50–1.10)
GFR, Est African American: 79 mL/min/{1.73_m2} (ref >=60)
GFR, Est Non African American: 68 mL/min/{1.73_m2} (ref >=60)
Globulin: 3.4 g/dL (calc) (ref 1.9–3.7)
Glucose, Bld: 86 mg/dL (ref 65–99)
Potassium: 4.3 mmol/L (ref 3.5–5.3)
Sodium: 138 mmol/L (ref 135–146)
Total Bilirubin: 0.3 mg/dL (ref 0.2–1.2)
Total Protein: 7.6 g/dL (ref 6.1–8.1)

## 2019-07-29 LAB — CBC WITH DIFFERENTIAL/PLATELET
Absolute Monocytes: 405 cells/uL (ref 200–950)
Basophils Absolute: 70 cells/uL (ref 0–200)
Basophils Relative: 0.8 %
Eosinophils Absolute: 625 cells/uL — ABNORMAL HIGH (ref 15–500)
Eosinophils Relative: 7.1 %
HCT: 41.3 % (ref 35.0–45.0)
Hemoglobin: 14.3 g/dL (ref 11.7–15.5)
Lymphs Abs: 3898 cells/uL (ref 850–3900)
MCH: 31.6 pg (ref 27.0–33.0)
MCHC: 34.6 g/dL (ref 32.0–36.0)
MCV: 91.2 fL (ref 80.0–100.0)
MPV: 10 fL (ref 7.5–12.5)
Monocytes Relative: 4.6 %
Neutro Abs: 3802 cells/uL (ref 1500–7800)
Neutrophils Relative %: 43.2 %
Platelets: 319 10*3/uL (ref 140–400)
RBC: 4.53 10*6/uL (ref 3.80–5.10)
RDW: 13.2 % (ref 11.0–15.0)
Total Lymphocyte: 44.3 %
WBC: 8.8 10*3/uL (ref 3.8–10.8)

## 2019-07-29 NOTE — Progress Notes (Signed)
Please advise the patient to space vitamin D to 50,000 units once every other week. We can recheck lab work in 1 month.   Ok to send refill of MTX.

## 2019-07-29 NOTE — Progress Notes (Signed)
Absolute eosinophils are mildly elevated.  Rest of CBC WNL.  Calcium is mildly elevated 10.4.  please advise the patient to hold calcium supplement if she is taking one. Please ask the patient how much vitamin D she is taking.

## 2019-08-01 ENCOUNTER — Telehealth: Payer: Self-pay | Admitting: *Deleted

## 2019-08-01 MED ORDER — METHOTREXATE SODIUM CHEMO INJECTION 50 MG/2ML: INTRAMUSCULAR | 2 refills | Status: DC

## 2019-08-01 MED ORDER — ENBREL SURECLICK 50 MG/ML ~~LOC~~ SOAJ: SUBCUTANEOUS | 0 refills | Status: DC

## 2019-08-01 NOTE — Telephone Encounter (Signed)
Patient request refill on MTX and Enbrel  Last Visit: 07/28/2019 Next Visit: 12/29/2019 Labs: 07/28/2019 TB Gold: 10/28/2018 Neg   Current Dose per office note 07/28/2019: Enbrel 50 mg sq weekly injections, MTX 0.8 ml sq once weekly,  Okay to refill per Dr. Estanislado Pandy

## 2019-08-01 NOTE — Telephone Encounter (Signed)
-----   Message from Ofilia Neas, PA-C sent at 07/29/2019  1:17 PM EDT ----- Please advise the patient to space vitamin D to 50,000 units once every other week. We can recheck lab work in 1 month.   Ok to send refill of MTX.

## 2019-08-02 ENCOUNTER — Telehealth: Payer: Self-pay | Admitting: *Deleted

## 2019-08-02 NOTE — Telephone Encounter (Signed)
Submitted a Prior Authorization request to [employer] for Methotrexate via Cover My Meds. Will update once we receive a response. ° ° ° ° ° °

## 2019-08-03 NOTE — Telephone Encounter (Signed)
Received notification from [facility] regarding a prior authorization for methotrexate. Authorization has been APPROVED from 08/02/2019 to 08/01/2119.   Phone: [phone] Fax: [phone]   Mariella Saa, PharmD, BCACP, CPP Clinical Specialty Pharmacist (Rheumatology and Pulmonology)  08/03/2019 8:46 AM

## 2019-08-04 ENCOUNTER — Other Ambulatory Visit: Payer: Self-pay | Admitting: *Deleted

## 2019-08-04 MED ORDER — PREDNISONE 5 MG PO TABS
ORAL_TABLET | ORAL | 0 refills | Status: DC
Start: 2019-08-04 — End: 2019-12-08

## 2019-08-04 NOTE — Telephone Encounter (Signed)
Received refill request on Prednisone taper   Spoke with patient and she requested it from the pharmacy. Patient states it was discussed at her last appointment.   Last Visit: 07/28/2019 Next Visit: 12/29/2019  Okay to refill Prednisone?

## 2019-08-04 NOTE — Telephone Encounter (Signed)
Ok to send in prednisone taper.

## 2019-08-29 MED FILL — ENBREL SURECLICK 50 MG/ML S: 50 | 28 days supply | Qty: 4 | Fill #0

## 2019-09-21 MED FILL — ENBREL SURECLICK 50 MG/ML S: 50 | 28 days supply | Qty: 4 | Fill #1

## 2019-09-26 ENCOUNTER — Telehealth: Payer: Self-pay | Admitting: *Deleted

## 2019-09-26 NOTE — Telephone Encounter (Signed)
Patient states she is having swelling in her hands, feet and legs. Patient states this has been going on since 09/22/2019. Patient states she did do a lot of walking on that day. Patient states she does not having anything that gives her relief. Patient states she only has pain when she is up in her feet. Patient is on MTX and Enbrel and denies missing any doses. Please advise.

## 2019-09-26 NOTE — Telephone Encounter (Signed)
Patient describes that she has fluid retention in her legs.  She does not have joint pain.  She states her socks leave impression.  Have advised her to schedule an appointment with her PCP.  She had Covid infection in January.  She has not received Covid vaccine.  Have advised her to get Covid vaccine as soon as possible.  I also advised her to delay her methotrexate dose by 1 week after each Covid vaccine.  No adjustment is needed in Enbrel dosing.  I also advised her to avoid NSAIDs and Tylenol for 24 hours prior to the vaccination.  Even if she is fully vaccinated she will have to continue wearing mask, practice hand hygiene and social distancing as the effect of vaccination could be planted in patients with autoimmune disease and immunosuppression.

## 2019-12-07 ENCOUNTER — Telehealth: Payer: Self-pay

## 2019-12-07 ENCOUNTER — Other Ambulatory Visit: Payer: Self-pay | Admitting: Rheumatology

## 2019-12-07 NOTE — Telephone Encounter (Signed)
Patient states she has been on antibiotics for about 1 month. Patient states she has been unable to take her Enbrel and MTX. Patient states she is having increased pain for approximately 2 weeks. Patient states she is having pain in her right arm. Patient states the pain moves. Patient states she has been having some swelling in her joints but does not have any today. (Last prednisone prescription was 08/04/2019)Please advise.

## 2019-12-07 NOTE — Telephone Encounter (Signed)
Patient states the first time she was given antibiotics for bacteria in her eyes. Patient states she went for a follow up and was given another antibiotic for a viral infection. Patient finished the antibiotics yesterday and has a follow up on Friday.

## 2019-12-07 NOTE — Telephone Encounter (Signed)
Please clarify what time of infection the patient is being treated with antibiotics for.

## 2019-12-07 NOTE — Telephone Encounter (Signed)
Patient left a voicemail stating she has been in a lot of pain for the past week and requesting a prescription to help with the pain.

## 2019-12-08 MED ORDER — PREDNISONE 5 MG PO TABS: ORAL_TABLET | ORAL | 0 refills | Status: DC

## 2019-12-08 NOTE — Telephone Encounter (Signed)
Patient advised If her infection has completely resolved and she has been cleared by her ophthalmologist she can resume Enbrel and MTX. Patient advised prescription for prednisone will be sent to the pharmacy.

## 2019-12-08 NOTE — Telephone Encounter (Signed)
If her infection has completely resolved and she has been cleared by her ophthalmologist she can resume Enbrel and MTX.  Ok to send in prednisone taper starting at 20 mg tapering by 5 mg every 4 days.

## 2019-12-16 NOTE — Progress Notes (Deleted)
Office Visit Note  Patient: Elizabeth Pope             Date of Birth: [birth year]           MRN: [id number]             PCP: Kristie Cowman, MD Referring: Kristie Cowman, MD Visit Date: 12/29/2019 Occupation: _0 @  Subjective:  No chief complaint on file.   History of Present Illness: Elizabeth Pope is a 39 y.o. female ***   Activities of Daily Living:  Patient reports morning stiffness for *** {minute/hour:19697}.   Patient {ACTIONS;DENIES/REPORTS:21021675::"Denies"} nocturnal pain.  Difficulty dressing/grooming: {ACTIONS;DENIES/REPORTS:21021675::"Denies"} Difficulty climbing stairs: {ACTIONS;DENIES/REPORTS:21021675::"Denies"} Difficulty getting out of chair: {ACTIONS;DENIES/REPORTS:21021675::"Denies"} Difficulty using hands for taps, buttons, cutlery, and/or writing: {ACTIONS;DENIES/REPORTS:21021675::"Denies"}  No Rheumatology ROS completed.   PMFS History:  Patient Active Problem List   Diagnosis Date Noted  . Asthma with acute exacerbation 09/12/2018  . 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 Pelvic pain 06/02/2017  . Atypical squamous cell changes of undetermined significance (ASCUS) on cervical cytology with negative high risk human papilloma virus (HPV) test result 02/03/2017  . Encounter for IUD insertion 01/28/2017  . Uterine fibroid 01/21/2017  . H/O tubal ligation 06/24/2016  . High risk medication use 01/07/2016  . GERD (gastroesophageal reflux disease) 12/25/2015  . Migraines 12/25/2015  . Hypertension 12/25/2015  . RA (rheumatoid arthritis) ([place]) 12/25/2015  . DDD (degenerative disc disease), cervical 12/25/2015  . Osteoarthritis of both hands 12/25/2015  . 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/2015  . 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 Bipolar 2 disorder ([place]) 05/14/2015  . Cigarette smoker 05/05/2015  . Chest pain 05/05/2015  . Solitary pulmonary nodule 05/04/2015    Past Medical  History:  Diagnosis Date  . Anxiety   . Bipolar 1 disorder ([place])   . DDD (degenerative disc disease), cervical 12/25/2015  . Depression   . GERD (gastroesophageal reflux disease) 12/25/2015  . Hypertension 12/25/2015  . Migraine   . Migraines 12/25/2015  . 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/2015   mild  . Osteoarthritis of both hands 12/25/2015  . RA (rheumatoid arthritis) ([facility]) 12/25/2015   Positive RF, Elevated ESR, Positive CCP > 250     Family History  Problem Relation Age of Onset  . Rheum arthritis Mother   . Migraines Mother   . Hypertension Mother   . Colitis Mother   . Rheum arthritis Maternal Grandmother   . Rheum arthritis Maternal Aunt   . Migraines Sister   . Migraines Brother   . Sickle cell anemia Daughter   . Bipolar disorder Daughter   . Anxiety disorder Daughter   . Depression Daughter   . Migraines Daughter   . Arrhythmia Daughter   . Migraines Son   . ADD / ADHD Son   . Seizures Son   . Arrhythmia Son    Past Surgical History:  Procedure Laterality Date  . CESAREAN SECTION    . TUBAL LIGATION     Social History   Social History Narrative  . Not on file    There is no immunization history on file for this patient.   Objective: Vital Signs: There were no vitals taken for this visit.   Physical Exam   Musculoskeletal Exam: ***  CDAI Exam: CDAI Score: -- Patient Global: --; Provider Global: -- Swollen: --; Tender: -- Joint Exam 12/29/2019   No joint exam has been documented for this visit   There is  currently no information documented on the homunculus. Go to the Rheumatology activity and complete the homunculus joint exam.  Investigation: No additional findings.  Imaging: No results found.  Recent Labs: Lab Results  Component Value Date   WBC 8.8 07/28/2019   HGB 14.3 07/28/2019   PLT 319 07/28/2019   NA 138 07/28/2019   K 4.3 07/28/2019   CL 104 07/28/2019   CO2 24 07/28/2019   GLUCOSE 86 07/28/2019   BUN 15  07/28/2019   CREATININE 1.03 07/28/2019   BILITOT 0.3 07/28/2019   ALKPHOS 49 09/09/2016   AST 16 07/28/2019   ALT 17 07/28/2019   PROT 7.6 07/28/2019   ALBUMIN 4.1 09/09/2016   CALCIUM 10.4 (H) 07/28/2019   GFRAA 79 07/28/2019   QFTBGOLDPLUS NEGATIVE 10/28/2018    Speciality Comments: PLQ Eye Exam: WNL 03/24/17 @ [facility] Prior therapy: Humira (facial rash)  Procedures:  No procedures performed Allergies: Fluoxetine, Humira [adalimumab], Hydrocodone-acetaminophen, Hydroxychloroquine, Other, and Vicodin [hydrocodone-acetaminophen]   Assessment / Plan:     Visit Diagnoses: Rheumatoid arthritis involving multiple sites with positive rheumatoid factor (HCC)  High risk medication use  Primary osteoarthritis of both hands  Primary osteoarthritis of both knees  Chondromalacia of both patellae  Primary osteoarthritis of both feet  DDD (degenerative disc disease), cervical  Essential hypertension  Solitary pulmonary nodule  Bipolar 2 disorder (HCC)  Cigarette smoker  TB lung, latent  History of migraine  History of gastroesophageal reflux (GERD)  Orders: No orders of the defined types were placed in this encounter.  No orders of the defined types were placed in this encounter.   Face-to-face time spent with patient was *** minutes. Greater than 50% of time was spent in counseling and coordination of care.  Follow-Up Instructions: No follow-ups on file.   Ofilia Neas, PA-C  Note - This record has been created using Dragon software.  Chart creation errors have been sought, but may not always  have been located. Such creation errors do not reflect on  the standard of medical care.

## 2019-12-29 ENCOUNTER — Ambulatory Visit: Payer: Medicare Other | Admitting: Rheumatology

## 2019-12-29 DIAGNOSIS — Z79899 Other long term (current) drug therapy: Secondary | ICD-10-CM

## 2019-12-29 DIAGNOSIS — F3181 Bipolar II disorder: Secondary | ICD-10-CM

## 2019-12-29 DIAGNOSIS — M5030 Other cervical disc degeneration, unspecified cervical region: Secondary | ICD-10-CM

## 2019-12-29 DIAGNOSIS — Z227 Latent tuberculosis: Secondary | ICD-10-CM

## 2019-12-29 DIAGNOSIS — M19071 Primary osteoarthritis, right ankle and foot: Secondary | ICD-10-CM

## 2019-12-29 DIAGNOSIS — R911 Solitary pulmonary nodule: Secondary | ICD-10-CM

## 2019-12-29 DIAGNOSIS — I10 Essential (primary) hypertension: Secondary | ICD-10-CM

## 2019-12-29 DIAGNOSIS — Z8669 Personal history of other diseases of the nervous system and sense organs: Secondary | ICD-10-CM

## 2019-12-29 DIAGNOSIS — M170 Bilateral primary osteoarthritis of knee: Secondary | ICD-10-CM

## 2019-12-29 DIAGNOSIS — M2241 Chondromalacia patellae, right knee: Secondary | ICD-10-CM

## 2019-12-29 DIAGNOSIS — M0579 Rheumatoid arthritis with rheumatoid factor of multiple sites without organ or systems involvement: Secondary | ICD-10-CM

## 2019-12-29 DIAGNOSIS — M19041 Primary osteoarthritis, right hand: Secondary | ICD-10-CM

## 2019-12-29 DIAGNOSIS — Z8719 Personal history of other diseases of the digestive system: Secondary | ICD-10-CM

## 2019-12-29 DIAGNOSIS — F17210 Nicotine dependence, cigarettes, uncomplicated: Secondary | ICD-10-CM

## 2019-12-30 NOTE — Progress Notes (Signed)
Office Visit Note  Patient: Elizabeth Pope             Date of Birth: 14-Sep-1980           MRN: [id number]             PCP: Kristie Cowman, MD Referring: Kristie Cowman, MD Visit Date: 01/02/2020 Occupation: @GUAROCC @  Subjective:  Joint pain and stiffness   History of Present Illness: Elizabeth Pope is a 39 y.o. female with history of rheumatoid arthritis and osteoarthritis.  She states she was off Enbrel and methotrexate for about a month due to eye infection.  She resumed both medications about a month ago.  She was having increased pain and discomfort in her joints when she was off the medications.  The symptoms are getting better now.  She still have some stiffness in her hands.  She continues to have some discomfort in her knee joints.  None of the joints are swollen now.  Activities of Daily Living:  Patient reports morning stiffness for 20 minutes.   Patient Reports nocturnal pain.  Difficulty dressing/grooming: Reports Difficulty climbing stairs: Reports Difficulty getting out of chair: Reports Difficulty using hands for taps, buttons, cutlery, and/or writing: Reports  Review of Systems  Constitutional: Positive for fatigue.  HENT: Positive for mouth dryness.   Eyes: Positive for redness and dryness.  Respiratory: Negative for shortness of breath.   Cardiovascular: Positive for swelling in legs/feet.  Gastrointestinal: Positive for diarrhea.  Endocrine: Positive for heat intolerance, excessive thirst and increased urination.  Genitourinary: Negative for difficulty urinating.  Musculoskeletal: Positive for arthralgias, gait problem, joint pain, muscle weakness, morning stiffness and muscle tenderness. Negative for joint swelling.  Skin: Negative for rash.  Allergic/Immunologic: Negative for susceptible to infections.  Neurological: Positive for numbness and weakness.  Hematological: Negative for bruising/bleeding tendency.  Psychiatric/Behavioral:  Positive for sleep disturbance. Negative for depressed mood. The patient is not nervous/anxious.     PMFS History:  Patient Active Problem List   Diagnosis Date Noted  . Asthma with acute exacerbation 09/12/2018  . 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 Pelvic pain 06/02/2017  . Atypical squamous cell changes of undetermined significance (ASCUS) on cervical cytology with negative high risk human papilloma virus (HPV) test result 02/03/2017  . Encounter for IUD insertion 01/28/2017  . Uterine fibroid 01/21/2017  . H/O tubal ligation 06/24/2016  . High risk medication use 01/07/2016  . GERD (gastroesophageal reflux disease) 12/25/2015  . Migraines 12/25/2015  . Hypertension 12/25/2015  . RA (rheumatoid arthritis) ([place]) 12/25/2015  . DDD (degenerative disc disease), cervical 12/25/2015  . Osteoarthritis of both hands 12/25/2015  . 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/2015  . 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 Bipolar 2 disorder ([place]) 05/14/2015  . Cigarette smoker 05/05/2015  . Chest pain 05/05/2015  . Solitary pulmonary nodule 05/04/2015    Past Medical History:  Diagnosis Date  . Anxiety   . Bipolar 1 disorder ([place])   . DDD (degenerative disc disease), cervical 12/25/2015  . Depression   . GERD (gastroesophageal reflux disease) 12/25/2015  . Hypertension 12/25/2015  . Migraine   . Migraines 12/25/2015  . OCD (obsessive compulsive disorder)   . 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/2015   mild  . Osteoarthritis of both hands 12/25/2015  . PTSD (post-traumatic stress disorder)   . RA (rheumatoid arthritis) (HCC) 12/25/2015   Positive RF, Elevated ESR, Positive CCP > 250  Family History  Problem Relation Age of Onset  . Rheum arthritis Mother   . Migraines Mother   . Hypertension Mother   . Colitis Mother   . Rheum arthritis Maternal Grandmother   . Rheum arthritis Maternal Aunt   . Migraines Sister   . Migraines  Brother   . Sickle cell anemia Daughter   . Bipolar disorder Daughter   . Anxiety disorder Daughter   . Depression Daughter   . Migraines Daughter   . Arrhythmia Daughter   . Migraines Son   . ADD / ADHD Son   . Seizures Son   . Arrhythmia Son    Past Surgical History:  Procedure Laterality Date  . CESAREAN SECTION    . TUBAL LIGATION     Social History   Social History Narrative  . Not on file    There is no immunization history on file for this patient.   Objective: Vital Signs: BP (!) 152/100 (BP Location: Right Arm, Patient Position: Sitting, Cuff Size: Normal)   Pulse 82   Resp 14   Ht 5' 6"  (1.676 m)   Wt 179 lb 12.8 oz (81.6 kg)   BMI 29.02 kg/m    Physical Exam Vitals and nursing note reviewed.  Constitutional:      Appearance: She is well-developed.  HENT:     Head: Normocephalic and atraumatic.  Eyes:     Conjunctiva/sclera: Conjunctivae normal.  Cardiovascular:     Rate and Rhythm: Normal rate and regular rhythm.     Heart sounds: Normal heart sounds.  Pulmonary:     Effort: Pulmonary effort is normal.     Breath sounds: Normal breath sounds.  Abdominal:     General: Bowel sounds are normal.     Palpations: Abdomen is soft.  Musculoskeletal:     Cervical back: Normal range of motion.  Lymphadenopathy:     Cervical: No cervical adenopathy.  Skin:    General: Skin is warm and dry.     Capillary Refill: Capillary refill takes less than 2 seconds.  Neurological:     Mental Status: She is alert and oriented to person, place, and time.  Psychiatric:        Behavior: Behavior normal.      Musculoskeletal Exam: Stiffness with range of motion with cervical spine.  Shoulder joints, elbow joints, wrist joints, MCPs PIPs and DIPs with good range of motion with no synovitis.  Hip joints, knee joints, ankles, MTPs and PIPs with good range of motion with no synovitis.  CDAI Exam: CDAI Score: 0.2  Patient Global: 1 mm; Provider Global: 1 mm Swollen: 0  ; Tender: 0  Joint Exam 01/02/2020   No joint exam has been documented for this visit   There is currently no information documented on the homunculus. Go to the Rheumatology activity and complete the homunculus joint exam.  Investigation: No additional findings.  Imaging: No results found.  Recent Labs: Lab Results  Component Value Date   WBC 8.8 07/28/2019   HGB 14.3 07/28/2019   PLT 319 07/28/2019   NA 138 07/28/2019   K 4.3 07/28/2019   CL 104 07/28/2019   CO2 24 07/28/2019   GLUCOSE 86 07/28/2019   BUN 15 07/28/2019   CREATININE 1.03 07/28/2019   BILITOT 0.3 07/28/2019   ALKPHOS 49 09/09/2016   AST 16 07/28/2019   ALT 17 07/28/2019   PROT 7.6 07/28/2019   ALBUMIN 4.1 09/09/2016   CALCIUM 10.4 (H) 07/28/2019   GFRAA  79 07/28/2019   QFTBGOLDPLUS NEGATIVE 10/28/2018    Speciality Comments: PLQ Eye Exam: WNL 03/24/17 @ [facility] Prior therapy: Humira (facial rash)  Procedures:  No procedures performed Allergies: Fluoxetine, Humira [adalimumab], Hydrocodone-acetaminophen, Hydroxychloroquine, Other, and Vicodin [hydrocodone-acetaminophen]   Assessment / Plan:     Visit Diagnoses: Rheumatoid arthritis involving multiple sites with positive rheumatoid factor (HCC) - +CCP, +RF: Patient has no synovitis today.  She states she had recent flare when she was off Enbrel and methotrexate for 1 month due to eye infection.  High risk medication use - Enbrel 50 mg sq weekly injections, MTX 0.8 ml sq once weekly, and folic acid 2 mg po daily. (Humira caused facial rash).  Her labs are due.  We will continue to monitor labs every 3 months.  Primary osteoarthritis of both hands-she has stiffness in her hands.  Primary osteoarthritis of both knees-she has stiffness in her knees.  No synovitis was noted.  Chondromalacia of both patellae  Primary osteoarthritis of both feet - X-rays of both feet were obtained and did not reveal any radiographic progression since 2018.  DDD  (degenerative disc disease), cervical-she continues to have some stiffness in her neck.  Pain of paraspinal muscle  Bipolar 2 disorder (HCC)  Essential hypertension-blood pressure is elevated today.  She has been advised to monitor blood pressure closely and follow-up with her PCP.  Solitary pulmonary nodule  Cigarette smoker - 1 cig twicw a week x 20 years.  Smoking cessation was discussed.  History of migraine  History of gastroesophageal reflux (GERD)  TB lung, latent - Treated in the past.  Her TB Gold has been negative.  COVID-19 virus infection - January 2021  Educated about COVID-19 virus infection-information regarding the COVID-19 vaccination and booster was given.  Information was placed in her AVS.  Orders: Orders Placed This Encounter  Procedures  . CBC with Differential/Platelet  . QuantiFERON-TB Gold Plus   No orders of the defined types were placed in this encounter.    Follow-Up Instructions: Return in about 3 months (around 04/03/2020) for Rheumatoid arthritis, Osteoarthritis.   Bo Merino, MD  Note - This record has been created using Editor, commissioning.  Chart creation errors have been sought, but may not always  have been located. Such creation errors do not reflect on  the standard of medical care.

## 2020-01-02 ENCOUNTER — Other Ambulatory Visit: Payer: Self-pay

## 2020-01-02 ENCOUNTER — Ambulatory Visit (INDEPENDENT_AMBULATORY_CARE_PROVIDER_SITE_OTHER): Payer: Medicare Other | Admitting: Rheumatology

## 2020-01-02 ENCOUNTER — Encounter: Payer: Self-pay | Admitting: Rheumatology

## 2020-01-02 VITALS — BP 152/100 | HR 82 | Resp 14 | Ht 66.0 in | Wt 179.8 lb

## 2020-01-02 DIAGNOSIS — M5030 Other cervical disc degeneration, unspecified cervical region: Secondary | ICD-10-CM

## 2020-01-02 DIAGNOSIS — Z8669 Personal history of other diseases of the nervous system and sense organs: Secondary | ICD-10-CM

## 2020-01-02 DIAGNOSIS — Z79899 Other long term (current) drug therapy: Secondary | ICD-10-CM | POA: Diagnosis not present

## 2020-01-02 DIAGNOSIS — M170 Bilateral primary osteoarthritis of knee: Secondary | ICD-10-CM

## 2020-01-02 DIAGNOSIS — U071 COVID-19: Secondary | ICD-10-CM

## 2020-01-02 DIAGNOSIS — Z8719 Personal history of other diseases of the digestive system: Secondary | ICD-10-CM

## 2020-01-02 DIAGNOSIS — M0579 Rheumatoid arthritis with rheumatoid factor of multiple sites without organ or systems involvement: Secondary | ICD-10-CM | POA: Diagnosis not present

## 2020-01-02 DIAGNOSIS — Z111 Encounter for screening for respiratory tuberculosis: Secondary | ICD-10-CM

## 2020-01-02 DIAGNOSIS — Z7189 Other specified counseling: Secondary | ICD-10-CM

## 2020-01-02 DIAGNOSIS — F17210 Nicotine dependence, cigarettes, uncomplicated: Secondary | ICD-10-CM

## 2020-01-02 DIAGNOSIS — M7918 Myalgia, other site: Secondary | ICD-10-CM

## 2020-01-02 DIAGNOSIS — M19041 Primary osteoarthritis, right hand: Secondary | ICD-10-CM

## 2020-01-02 DIAGNOSIS — M2242 Chondromalacia patellae, left knee: Secondary | ICD-10-CM

## 2020-01-02 DIAGNOSIS — M19071 Primary osteoarthritis, right ankle and foot: Secondary | ICD-10-CM

## 2020-01-02 DIAGNOSIS — Z227 Latent tuberculosis: Secondary | ICD-10-CM

## 2020-01-02 DIAGNOSIS — F3181 Bipolar II disorder: Secondary | ICD-10-CM

## 2020-01-02 DIAGNOSIS — M19072 Primary osteoarthritis, left ankle and foot: Secondary | ICD-10-CM

## 2020-01-02 DIAGNOSIS — M2241 Chondromalacia patellae, right knee: Secondary | ICD-10-CM

## 2020-01-02 DIAGNOSIS — M19042 Primary osteoarthritis, left hand: Secondary | ICD-10-CM

## 2020-01-02 DIAGNOSIS — R911 Solitary pulmonary nodule: Secondary | ICD-10-CM

## 2020-01-02 DIAGNOSIS — I10 Essential (primary) hypertension: Secondary | ICD-10-CM

## 2020-01-02 NOTE — Patient Instructions (Addendum)
COVID-19 vaccine recommendations:   COVID-19 vaccine is recommended for everyone (unless you are allergic to a vaccine component), even if you are on a medication that suppresses your immune system.   If you are on Methotrexate, Cellcept (mycophenolate), Rinvoq, Morrie Sheldon, and Olumiant- hold the medication for 1 week after each vaccine. Hold Methotrexate for 2 weeks after the single dose COVID-19 vaccine.   Do not take Tylenol or any anti-inflammatory medications (NSAIDs) 24 hours prior to the COVID-19 vaccination.   There is no direct evidence about the efficacy of the COVID-19 vaccine in individuals who are on medications that suppress the immune system.   Even if you are fully vaccinated, and you are on any medications that suppress your immune system, please continue to wear a mask, maintain at least six feet social distance and practice hand hygiene.   If you develop a COVID-19 infection, please contact your PCP or our office to determine if you need monoclonal antibody infusion.  The booster vaccine is now available for immunocompromised patients.   Please see the following web sites for updated information.   [email or web address]   Standing Labs We placed an order today for your standing lab work.   Please have your standing labs drawn in February and every 3 months  If possible, please have your labs drawn 2 weeks prior to your appointment so that the provider can discuss your results at your appointment.  We have open lab daily Monday through Thursday from 8:30-12:30 PM and 1:30-4:30 PM and Friday from 8:30-12:30 PM and 1:30-4:00 PM at the office of Dr. Bo Merino, [facility].   Please be advised, patients with office appointments requiring lab work will take precedents over walk-in lab work.  If possible, please come for your lab work on Monday and Friday afternoons, as you may experience  shorter wait times. The office is located at [address], [place], [place], [place] [address] No appointment is necessary.   Labs are drawn by [facility]. Please bring your co-pay at the time of your lab draw.  You may receive a bill from [facility] for your lab work.  If you wish to have your labs drawn at another location, please call the office 24 hours in advance to send orders.  If you have any questions regarding directions or hours of operation,  please call [phone].   As a reminder, please drink plenty of water prior to coming for your lab work. Thanks!

## 2020-01-04 LAB — QUANTIFERON-TB GOLD PLUS
Mitogen-NIL: >10 IU/mL
NIL: 0.04 IU/mL
QuantiFERON-TB Gold Plus: NEGATIVE
TB1-NIL: 0.01 IU/mL
TB2-NIL: 0 IU/mL

## 2020-01-04 LAB — CBC WITH DIFFERENTIAL/PLATELET
Absolute Monocytes: 419 cells/uL (ref 200–950)
Basophils Absolute: 30 cells/uL (ref 0–200)
Basophils Relative: 0.5 %
Eosinophils Absolute: 478 cells/uL (ref 15–500)
Eosinophils Relative: 8.1 %
HCT: 38.7 % (ref 35.0–45.0)
Hemoglobin: 13.7 g/dL (ref 11.7–15.5)
Lymphs Abs: 2283 cells/uL (ref 850–3900)
MCH: 31.6 pg (ref 27.0–33.0)
MCHC: 35.4 g/dL (ref 32.0–36.0)
MCV: 89.2 fL (ref 80.0–100.0)
MPV: 9.6 fL (ref 7.5–12.5)
Monocytes Relative: 7.1 %
Neutro Abs: 2690 cells/uL (ref 1500–7800)
Neutrophils Relative %: 45.6 %
Platelets: 326 10*3/uL (ref 140–400)
RBC: 4.34 10*6/uL (ref 3.80–5.10)
RDW: 14.3 % (ref 11.0–15.0)
Total Lymphocyte: 38.7 %
WBC: 5.9 10*3/uL (ref 3.8–10.8)

## 2020-01-05 ENCOUNTER — Telehealth: Payer: Self-pay | Admitting: *Deleted

## 2020-01-05 NOTE — Telephone Encounter (Signed)
Lab results received from Dr. Cristal Generous Reviewed by Hazel Sams, PA-C  T4 0.77 Prolactin 36.2 All other labs WNL  Patient on Enbrel 50 mg SQ weekly and MTX 0.8 mL weekly.

## 2020-01-30 ENCOUNTER — Ambulatory Visit (INDEPENDENT_AMBULATORY_CARE_PROVIDER_SITE_OTHER): Payer: Medicare Other

## 2020-01-30 ENCOUNTER — Other Ambulatory Visit: Payer: Self-pay

## 2020-01-30 ENCOUNTER — Ambulatory Visit (HOSPITAL_COMMUNITY)
Admission: EM | Admit: 2020-01-30 | Discharge: 2020-01-30 | Disposition: A | Discharge status: Home / Self Care | Payer: Medicare Other | Attending: Physician Assistant | Admitting: Physician Assistant

## 2020-01-30 ENCOUNTER — Encounter (HOSPITAL_COMMUNITY): Payer: Self-pay | Admitting: *Deleted

## 2020-01-30 DIAGNOSIS — J029 Acute pharyngitis, unspecified: Secondary | ICD-10-CM | POA: Insufficient documentation

## 2020-01-30 DIAGNOSIS — Z79899 Other long term (current) drug therapy: Secondary | ICD-10-CM | POA: Insufficient documentation

## 2020-01-30 DIAGNOSIS — R0781 Pleurodynia: Secondary | ICD-10-CM | POA: Insufficient documentation

## 2020-01-30 DIAGNOSIS — R0602 Shortness of breath: Secondary | ICD-10-CM | POA: Insufficient documentation

## 2020-01-30 DIAGNOSIS — R0789 Other chest pain: Secondary | ICD-10-CM | POA: Diagnosis not present

## 2020-01-30 DIAGNOSIS — J069 Acute upper respiratory infection, unspecified: Secondary | ICD-10-CM | POA: Diagnosis not present

## 2020-01-30 DIAGNOSIS — F17290 Nicotine dependence, other tobacco product, uncomplicated: Secondary | ICD-10-CM | POA: Insufficient documentation

## 2020-01-30 DIAGNOSIS — Z20822 Contact with and (suspected) exposure to covid-19: Secondary | ICD-10-CM | POA: Diagnosis not present

## 2020-01-30 DIAGNOSIS — Z885 Allergy status to narcotic agent status: Secondary | ICD-10-CM | POA: Diagnosis not present

## 2020-01-30 IMAGING — DX DG CHEST 2V
2 series · 2 of 2 positions shown · non-contrast
Comparison: [DATE].

CLINICAL DATA: Shortness of breath.

EXAM:
CHEST - 2 VIEW

[chest pa]
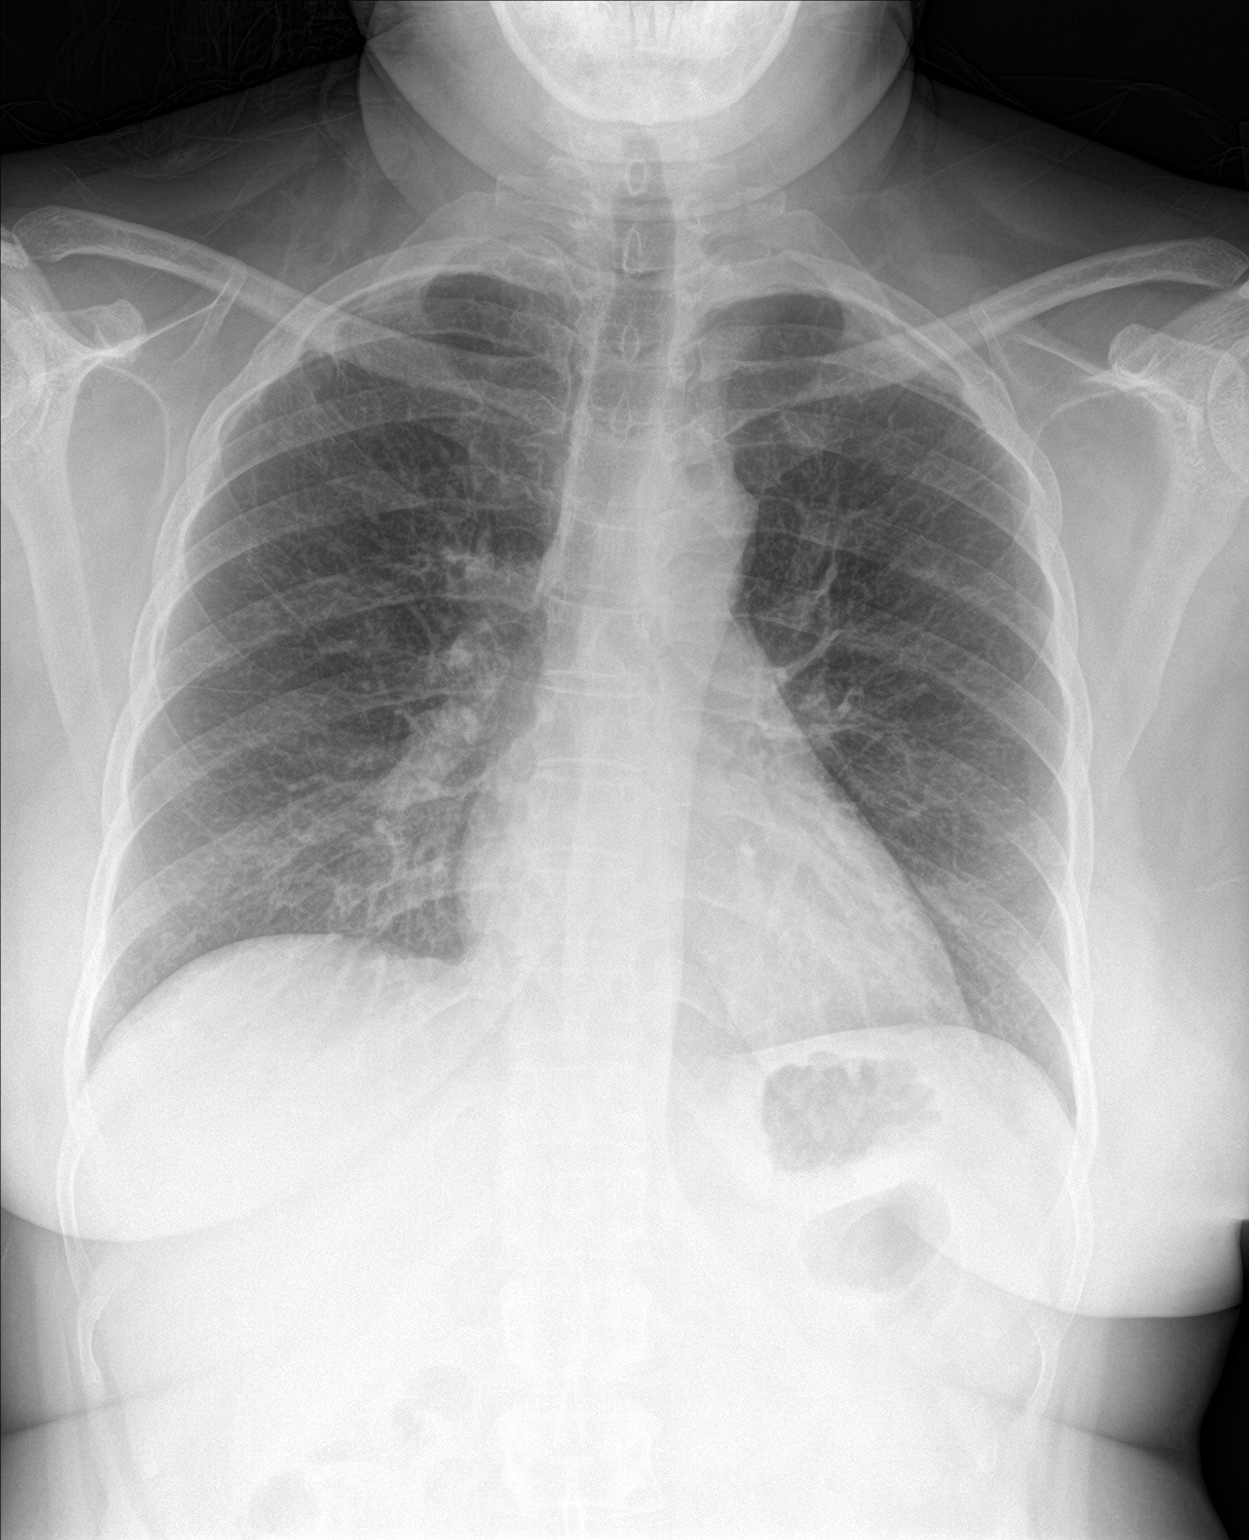

[chest lat]
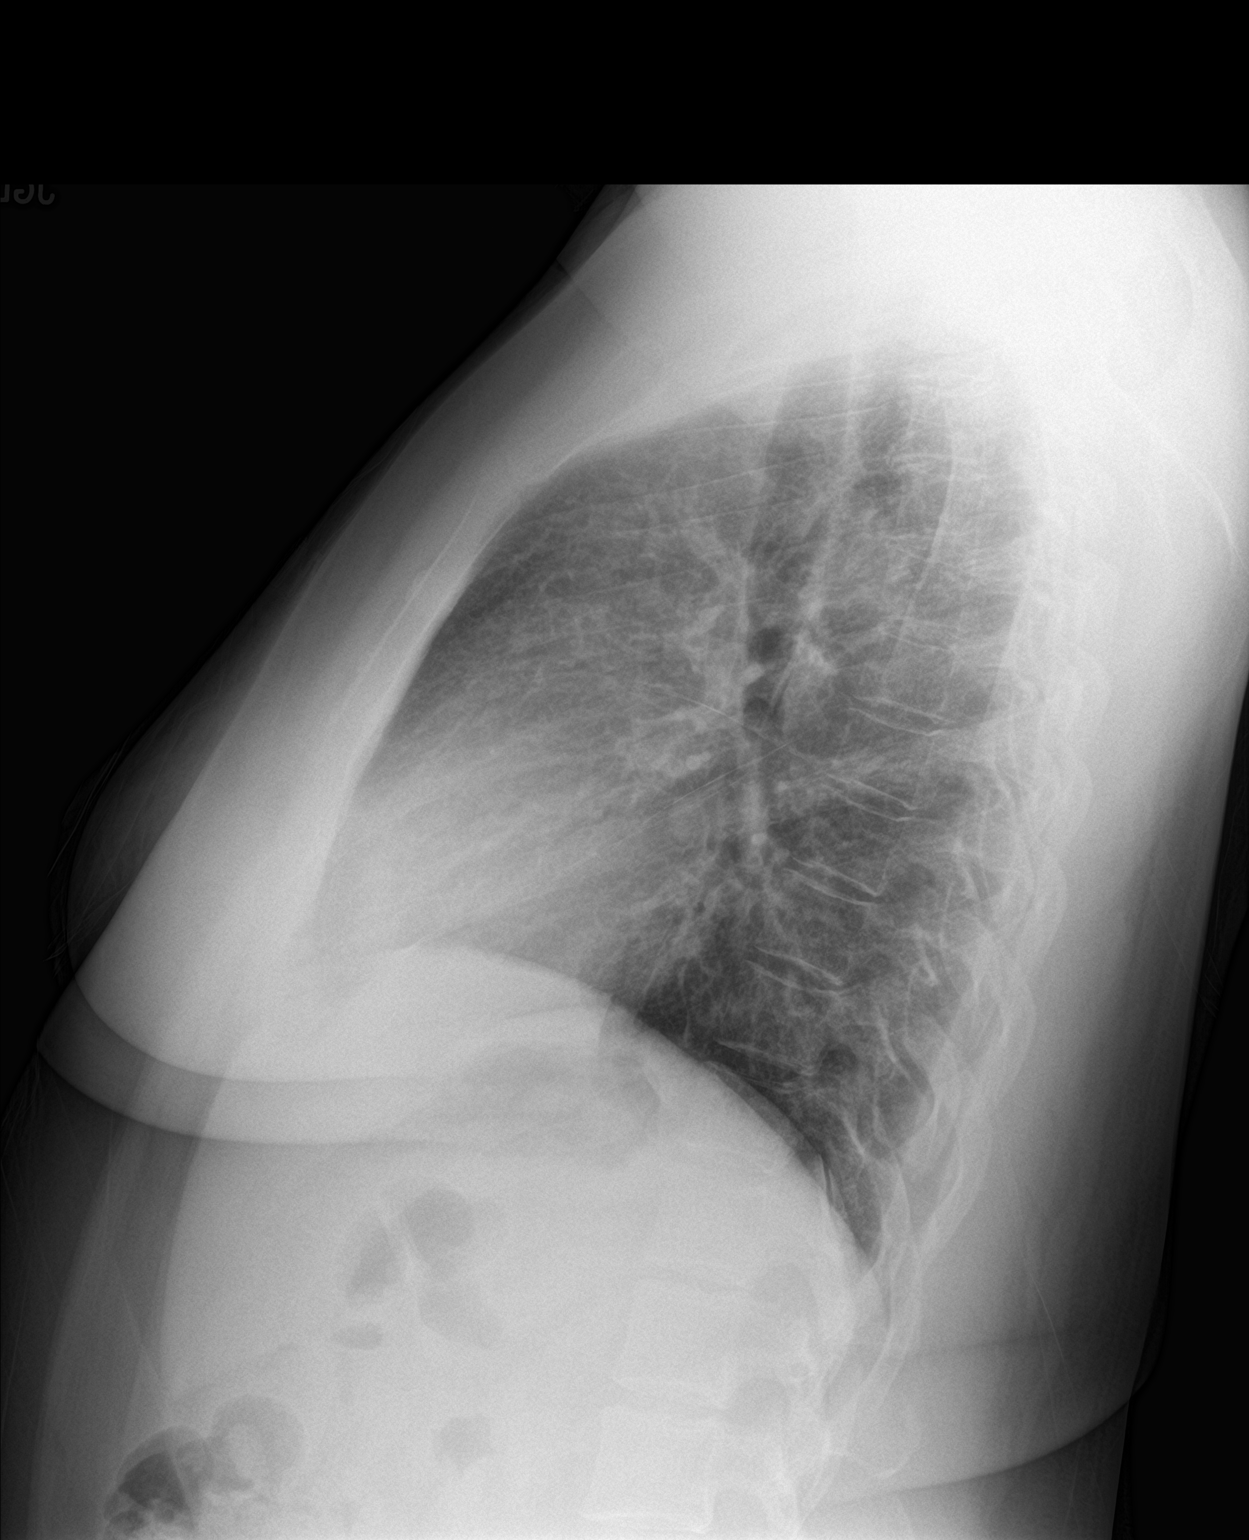

[2 of 2 positions shown; findings below may reference images not displayed]

FINDINGS: The heart size and mediastinal contours are within normal limits.
Both lungs are clear. No pneumothorax or pleural effusion is noted.
The visualized skeletal structures are unremarkable.
IMPRESSION: No active cardiopulmonary disease.

## 2020-01-30 MED ORDER — CYCLOBENZAPRINE HCL 5 MG PO TABS: 10.0000 mg | ORAL_TABLET | Freq: Three times a day (TID) | ORAL | 0 refills | Status: AC | PRN

## 2020-01-30 MED ORDER — KETOROLAC TROMETHAMINE 30 MG/ML IJ SOLN
30.0000 mg | Freq: Once | INTRAMUSCULAR | Status: AC
  Administered 2020-01-30: 30 mg via INTRAMUSCULAR

## 2020-01-30 MED ORDER — BENZONATATE 100 MG PO CAPS: 100.0000 mg | ORAL_CAPSULE | Freq: Three times a day (TID) | ORAL | 0 refills | Status: DC | PRN

## 2020-01-30 MED ORDER — KETOROLAC TROMETHAMINE 30 MG/ML IJ SOLN
INTRAMUSCULAR | Status: AC
  Filled 2020-01-30: qty 1

## 2020-01-30 NOTE — ED Triage Notes (Signed)
Pt presents with sore throat,cough ,runny nose . Pt also has sharp stabbing pain when coughing to Lt rib area and Lt shoulder.

## 2020-01-30 NOTE — ED Provider Notes (Addendum)
[facility]    CSN: [id number] Arrival date & time: 01/30/20  1648      History   Chief Complaint Chief Complaint  Patient presents with  . Sore Throat  . Cough  . Chest Pain    Lt rib pain    HPI Elizabeth Pope is a 39 y.o. female.   Pt complains of cough, congestion, sore throat that started three days ago.  Denies fever, chills, nausea/vomiting, diarrhea. She also complains of body aches.  She had her second dose of COVID vaccine on 01/26/20. She reports daughter is now complaining of sore throat.  She is taking Mucinex with no improvement.  Complains of intermittent wheezing. She has h/o asthma, using albuterol with some relief.  Cough is worse at night.  Complains of left lateral rib pain that is worse with cough. Pt tearful on exam.      Past Medical History:  Diagnosis Date  . Anxiety   . Bipolar 1 disorder ([place])   . DDD (degenerative disc disease), cervical 12/25/2015  . Depression   . GERD (gastroesophageal reflux disease) 12/25/2015  . Hypertension 12/25/2015  . Migraine   . Migraines 12/25/2015  . OCD (obsessive compulsive disorder)   . 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/2015   mild  . Osteoarthritis of both hands 12/25/2015  . PTSD (post-traumatic stress disorder)   . RA (rheumatoid arthritis) (HCC) 12/25/2015   Positive RF, Elevated ESR, Positive CCP > 250     Patient Active Problem List   Diagnosis Date Noted  . Asthma with acute exacerbation 09/12/2018  . 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 Pelvic pain 06/02/2017  . Atypical squamous cell changes of undetermined significance (ASCUS) on cervical cytology with negative high risk human papilloma virus (HPV) test result 02/03/2017  . Encounter for IUD insertion 01/28/2017  . Uterine fibroid 01/21/2017  . H/O tubal ligation 06/24/2016  . High risk medication use 01/07/2016  . GERD (gastroesophageal reflux disease) 12/25/2015  . Migraines 12/25/2015  . Hypertension  12/25/2015  . RA (rheumatoid arthritis) ([place]) 12/25/2015  . DDD (degenerative disc disease), cervical 12/25/2015  . Osteoarthritis of both hands 12/25/2015  . 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/2015  . 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 Bipolar 2 disorder ([place]) 05/14/2015  . Cigarette smoker 05/05/2015  . Chest pain 05/05/2015  . Solitary pulmonary nodule 05/04/2015    Past Surgical History:  Procedure Laterality Date  . CESAREAN SECTION    . TUBAL LIGATION      OB History    Gravida  4   Para  3   Term  3   Preterm      AB  1   Living  3     SAB  1   TAB      Ectopic      Multiple      Live Births  3            Home Medications    Prior to Admission medications   Medication Sig Start Date End Date Taking? Authorizing Provider  acetaminophen (TYLENOL) 325 MG tablet Take 650 mg by mouth every 6 (six) hours as needed.    [provider]  albuterol (VENTOLIN HFA) 108 (90 Base) MCG/ACT inhaler Inhale 1-2 puffs into the lungs every 6 (six) hours as needed for wheezing or shortness of breath. 12/11/18   Augusto Gamble B, NP  amLODipine (NORVASC) 5 MG tablet Take 1  tablet by mouth daily. Reported on 08/27/2015 04/21/15   [provider]  cetirizine (ZYRTEC) 10 MG tablet TK 1 T PO D PRN 06/12/16   [provider]  ergocalciferol (VITAMIN D2) 1.25 MG (50000 UT) capsule Take 50,000 Units by mouth once a week.    [provider]  etanercept (ENBREL SURECLICK) 50 MG/ML injection INJECT 0.98 MLS (50 MG TOTAL) INTO THE SKIN ONCE A WEEK. 08/01/19   Bo Merino, MD  Fluticasone-Salmeterol (ADVAIR) 100-50 MCG/DOSE AEPB Inhale 1 puff into the lungs 2 (two) times daily.    [provider]  folic acid (FOLVITE) 1 MG tablet TAKE 2 TABLETS BY MOUTH EVERY DAY 01/28/19   Bo Merino, MD  hydrOXYzine (ATARAX/VISTARIL) 25 MG tablet Take 25 mg by mouth 3 (three) times daily as needed.     [provider]  Ketotifen Fumarate (ALLERGY EYE DROPS OP) Apply to eye daily as needed.    [provider]  lamoTRIgine (LAMICTAL) 100 MG tablet Take 100 mg daily by mouth. 03/18/16   [provider]  methotrexate 50 MG/2ML injection ADMINISTER 0.8 ML(20 MG) UNDER THE SKIN 1 TIME A WEEK 08/01/19   Deveshwar, Abel Presto, MD  montelukast (SINGULAIR) 10 MG tablet Take 10 mg by mouth at bedtime.    [provider]  SAFETY-LOK TB SYRINGE 27GX.5" 27G X 1/2" 1 ML MISC USE ONCE WEEKLY 12/07/19   Bo Merino, MD  SAFETY-LOK TB SYRINGE 27GX.5" 27G X 1/2" 1 ML MISC USE ONCE WEEKLY 12/07/19   Bo Merino, MD  sertraline (ZOLOFT) 100 MG tablet Take 100 mg daily by mouth. 12/16/16   [provider]  traZODone (DESYREL) 50 MG tablet Take 100 mg by mouth at bedtime as needed.  04/11/15   [provider]    Family History Family History  Problem Relation Age of Onset  . Rheum arthritis Mother   . Migraines Mother   . Hypertension Mother   . Colitis Mother   . Rheum arthritis Maternal Grandmother   . Rheum arthritis Maternal Aunt   . Migraines Sister   . Migraines Brother   . Sickle cell anemia Daughter   . Bipolar disorder Daughter   . Anxiety disorder Daughter   . Depression Daughter   . Migraines Daughter   . Arrhythmia Daughter   . Migraines Son   . ADD / ADHD Son   . Seizures Son   . Arrhythmia Son     Social History Social History   Tobacco Use  . Smoking status: Current Some Day Smoker    Packs/day: 0.50    Years: 19.00    Pack years: 9.50    Types: Cigars  . Smokeless tobacco: Never Used  . Tobacco comment: former cigarette smoker  Vaping Use  . Vaping Use: Former  Substance Use Topics  . Alcohol use: Yes    Alcohol/week: 0.0 standard drinks    Comment: occasional  . Drug use: No     Allergies   Fluoxetine, Humira [adalimumab], Hydrocodone-acetaminophen, Hydroxychloroquine, Other, and Vicodin  [hydrocodone-acetaminophen]   Review of Systems Review of Systems  Constitutional: Negative for chills and fever.  HENT: Positive for congestion and rhinorrhea. Negative for ear pain and sore throat.   Eyes: Negative for pain and visual disturbance.  Respiratory: Positive for cough and wheezing. Negative for shortness of breath.   Cardiovascular: Negative for chest pain and palpitations.  Gastrointestinal: Negative for abdominal pain, diarrhea, nausea and vomiting.  Genitourinary: Negative for dysuria and hematuria.  Musculoskeletal: Negative  for arthralgias and back pain.  Skin: Negative for color change and rash.  Neurological: Negative for seizures and syncope.  All other systems reviewed and are negative.    Physical Exam Triage Vital Signs ED Triage Vitals  Enc Vitals Group     BP 01/30/20 1741 (!) 146/103     Pulse Rate 01/30/20 1741 87     Resp 01/30/20 1741 20     Temp 01/30/20 1741 98.6 F (37 C)     Temp Source 01/30/20 1741 Oral     SpO2 01/30/20 1741 97 %     Weight 01/30/20 1744 178 lb (80.7 kg)     Height 01/30/20 1744 5' 4" (1.626 m)     Head Circumference --      Peak Flow --      Pain Score 01/30/20 1744 8     Pain Loc --      Pain Edu? --      Excl. in [place]? --    No data found.  Updated Vital Signs BP (!) 146/103 (BP Location: Right Arm)   Pulse 87   Temp 98.6 F (37 C) (Oral)   Resp 20   Ht 5' 4" (1.626 m)   Wt 178 lb (80.7 kg)   LMP 01/30/2020   SpO2 97%   BMI 30.55 kg/m   Visual Acuity Right Eye Distance:   Left Eye Distance:   Bilateral Distance:    Right Eye Near:   Left Eye Near:    Bilateral Near:     Physical Exam Vitals and nursing note reviewed.  Constitutional:      General: She is not in acute distress.    Appearance: She is well-developed.  HENT:     Head: Normocephalic and atraumatic.  Eyes:     Conjunctiva/sclera: Conjunctivae normal.  Cardiovascular:     Rate and Rhythm: Normal rate and regular rhythm.      Heart sounds: No murmur heard.   Pulmonary:     Effort: Pulmonary effort is normal. No respiratory distress.     Breath sounds: Normal breath sounds. No decreased breath sounds, wheezing or rhonchi.  Chest:    Abdominal:     Palpations: Abdomen is soft.     Tenderness: There is no abdominal tenderness.  Musculoskeletal:     Cervical back: Neck supple.  Skin:    General: Skin is warm and dry.  Neurological:     Mental Status: She is alert.      UC Treatments / Results  Labs (all labs ordered are listed, but only abnormal results are displayed) Labs Reviewed - No data to display  EKG   Radiology No results found.  Procedures Procedures (including critical care time)  Medications Ordered in UC Medications - No data to display  Initial Impression / Assessment and Plan / UC Course  I have reviewed the triage vital signs and the nursing notes.  Pertinent labs & imaging results that were available during my care of the patient were reviewed by me and considered in my medical decision making (see chart for details).     URI.  COVID test pending. Will send in tessalon pearls.  Recommend she continue with Mucinex, may take Flonase.  Isolation precautions given. She will continue with albuterol as needed. Vitals WNL.  Rib pain, muscle spasm due to cough. TTP on exam. No abnormal findings on xray. She can take ibuprofen as needed for discomfort.  Flexeril sent to pharmacy. Encouraged deep breathing.  Final  Clinical Impressions(s) / UC Diagnoses   Final diagnoses:  None   Discharge Instructions   None    ED Prescriptions    None     PDMP not reviewed this encounter.   Konrad Felix, PA-C 01/30/20 1823    Konrad Felix, PA-C 01/30/20 1904

## 2020-01-30 NOTE — Discharge Instructions (Addendum)
Recommend Ibuprofen as needed Take medications as prescribed If symptoms worsen, you experience shortness of breath please follow up here or the Emergency Department

## 2020-01-31 LAB — SARS CORONAVIRUS 2 (TAT 6-24 HRS): SARS Coronavirus 2: NEGATIVE

## 2020-02-22 ENCOUNTER — Other Ambulatory Visit: Payer: Self-pay

## 2020-02-22 MED ORDER — PREDNISONE 5 MG PO TABS: ORAL_TABLET | ORAL | 0 refills | Status: DC

## 2020-02-22 NOTE — Telephone Encounter (Signed)
Please advise the patient to follow up with PCP for further evaluation of the persistence congestion and cough.  She should be cleared prior to resuming immunosuppressive agents. Ok to send in prednisone taper starting at 20 mg tapering by 5 mg every 4 days.

## 2020-02-22 NOTE — Telephone Encounter (Signed)
Patient states she was on antibiotics for an upper respiratory infection. Patient states she has completed her antibiotics. Patient states she is still having congestion and unable to sleep die to coughing all night. Patient is on MTX and Enbrel since the beginning of the month. Patient wants to about restarting her medication. Patient states she is having swelling and pain in her hands. Patient states she is also having pain in her elbows and groin. Please advise.

## 2020-02-22 NOTE — Telephone Encounter (Signed)
Patient called stating she just completed her antibiotics and is requesting a return call to let her know when she can restart her Enbrel and Methotrexate.  Patient states her hands are swollen and painful.  Patient states she is also out of her Enbrel medication.

## 2020-02-22 NOTE — Telephone Encounter (Signed)
Patient advised to follow up with PCP for further evaluation of the persistence congestion and cough.  Patient advised she should be cleared prior to resuming immunosuppressive agents. Patient advised we are sending in a prescription for prednisone taper starting at 20 mg tapering by 5 mg every 4 days. Patient expressed understanding.

## 2020-03-19 NOTE — Progress Notes (Signed)
Office Visit Note  Patient: Elizabeth Pope             Date of Birth: 1980/07/19           MRN: [id number]             PCP: Kristie Cowman, MD Referring: Kristie Cowman, MD Visit Date: 04/02/2020 Occupation: @GUAROCC @  Subjective:  Discuss restarting   History of Present Illness: Elizabeth Pope is a 40 y.o. female with history of seropositive rheumatoid arthritis and osteoarthritis.  She has been holding enbrel and methotrexate since October 2021 due to recurrent infections.  She is currently off of antibiotics and completed her last course of azithromycin at the beginning of January 2022.  She has been sneezing and congested over the past several days which she attributes to allergies. She denies any fevers or cough.  She continues to get allergy shots on a weekly basis.  She states she had a recent eye infection, but she has completed the antibiotic eye drops.  She has not followed back up with her ophthalmologist yet.  She has been experiencing intermittent joint pain and joint swelling while off of Enbrel and MTX.  A prednisone taper was prescribed on 02/22/20, which she has completed.  She has been taking aleve as needed for symptomatic relief.  She denies any joint pain or joint swelling today.     Activities of Daily Living:  Patient reports morning stiffness for 10-15 minutes.   Patient Denies nocturnal pain.  Difficulty dressing/grooming: Denies Difficulty climbing stairs: Reports Difficulty getting out of chair: Reports Difficulty using hands for taps, buttons, cutlery, and/or writing: Reports  Review of Systems  Constitutional: Positive for fatigue.  HENT: Positive for mouth dryness and nose dryness. Negative for mouth sores.   Eyes: Positive for pain, visual disturbance and dryness.  Respiratory: Positive for cough, shortness of breath and difficulty breathing. Negative for hemoptysis.   Cardiovascular: Positive for palpitations, hypertension and swelling in  legs/feet. Negative for chest pain.  Gastrointestinal: Negative for abdominal pain, blood in stool, constipation and diarrhea.  Endocrine: Negative for increased urination.  Genitourinary: Negative for painful urination.  Musculoskeletal: Positive for arthralgias, joint pain, joint swelling, myalgias, muscle weakness, morning stiffness, muscle tenderness and myalgias.  Skin: Negative for color change, pallor, rash, hair loss, nodules/bumps, skin tightness, ulcers and sensitivity to sunlight.  Allergic/Immunologic: Positive for susceptible to infections.  Neurological: Positive for headaches and weakness. Negative for dizziness and numbness.  Hematological: Negative for swollen glands.  Psychiatric/Behavioral: Positive for depressed mood and sleep disturbance. The patient is nervous/anxious.     PMFS History:  Patient Active Problem List   Diagnosis Date Noted  . Asthma with acute exacerbation 09/12/2018  . 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 Pelvic pain 06/02/2017  . Atypical squamous cell changes of undetermined significance (ASCUS) on cervical cytology with negative high risk human papilloma virus (HPV) test result 02/03/2017  . Encounter for IUD insertion 01/28/2017  . Uterine fibroid 01/21/2017  . H/O tubal ligation 06/24/2016  . High risk medication use 01/07/2016  . GERD (gastroesophageal reflux disease) 12/25/2015  . Migraines 12/25/2015  . Hypertension 12/25/2015  . RA (rheumatoid arthritis) ([place]) 12/25/2015  . DDD (degenerative disc disease), cervical 12/25/2015  . Osteoarthritis of both hands 12/25/2015  . 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/2015  . 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 Bipolar 2 disorder ([place]) 05/14/2015  . Cigarette smoker 05/05/2015  . Chest pain  05/05/2015  . Solitary pulmonary nodule 05/04/2015    Past Medical History:  Diagnosis Date  . Anxiety   . Bipolar 1 disorder ([place])   . DDD  (degenerative disc disease), cervical 12/25/2015  . Depression   . GERD (gastroesophageal reflux disease) 12/25/2015  . Hypertension 12/25/2015  . Migraine   . Migraines 12/25/2015  . OCD (obsessive compulsive disorder)   . 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/2015   mild  . Osteoarthritis of both hands 12/25/2015  . PTSD (post-traumatic stress disorder)   . RA (rheumatoid arthritis) ([facility]) 12/25/2015   Positive RF, Elevated ESR, Positive CCP > 250     Family History  Problem Relation Age of Onset  . Rheum arthritis Mother   . Migraines Mother   . Hypertension Mother   . Colitis Mother   . Rheum arthritis Maternal Grandmother   . Rheum arthritis Maternal Aunt   . Migraines Sister   . Migraines Brother   . Sickle cell anemia Daughter   . Bipolar disorder Daughter   . Anxiety disorder Daughter   . Depression Daughter   . Migraines Daughter   . Arrhythmia Daughter   . Migraines Son   . ADD / ADHD Son   . Seizures Son   . Arrhythmia Son    Past Surgical History:  Procedure Laterality Date  . CESAREAN SECTION    . TUBAL LIGATION     Social History   Social History Narrative  . Not on file   Immunization History  Administered Date(s) Administered  . PFIZER(Purple Top)SARS-COV-2 Vaccination 01/05/2020, 01/26/2020     Objective: Vital Signs: BP (!) 133/96 (BP Location: Left Arm, Patient Position: Sitting, Cuff Size: Normal)   Pulse 91   Ht 5' 3.5" (1.613 m)   Wt 176 lb 9.6 oz (80.1 kg)   BMI 30.79 kg/m    Physical Exam Vitals and nursing note reviewed.  Constitutional:      Appearance: She is well-developed and well-nourished.  HENT:     Head: Normocephalic and atraumatic.  Eyes:     Extraocular Movements: EOM normal.     Conjunctiva/sclera: Conjunctivae normal.  Cardiovascular:     Pulses: Intact distal pulses.  Pulmonary:     Effort: Pulmonary effort is normal.  Abdominal:     Palpations: Abdomen is soft.  Musculoskeletal:     Cervical back: Normal  range of motion.  Skin:    General: Skin is warm and dry.     Capillary Refill: Capillary refill takes less than 2 seconds.  Neurological:     Mental Status: She is alert and oriented to person, place, and time.  Psychiatric:        Mood and Affect: Mood and affect normal.        Behavior: Behavior normal.      Musculoskeletal Exam: C-spine, thoracic spine, and lumbar spine good ROM.  Painful ROM of lumbar spine.  Shoulder joints, elbow joints, wrist joints, MCPs, PIPs, and DIPs good ROM with no synovitis.  Complete fist formation bilaterally.  Hip joints good ROM with no discomfort.  Tenderness over the right trochanteric bursa.  Knee joints good ROM with no discomfort.  No warmth or effusion of knee joints.  No tenderness or swelling of ankle joints.   CDAI Exam: CDAI Score: 1.6  Patient Global: 8 mm; Provider Global: 8 mm Swollen: 0 ; Tender: 0  Joint Exam 04/02/2020   No joint exam has been documented for this visit   There is currently no  information documented on the homunculus. Go to the Rheumatology activity and complete the homunculus joint exam.  Investigation: No additional findings.  Imaging: No results found.  Recent Labs: Lab Results  Component Value Date   WBC 5.9 01/02/2020   HGB 13.7 01/02/2020   PLT 326 01/02/2020   NA 138 07/28/2019   K 4.3 07/28/2019   CL 104 07/28/2019   CO2 24 07/28/2019   GLUCOSE 86 07/28/2019   BUN 15 07/28/2019   CREATININE 1.03 07/28/2019   BILITOT 0.3 07/28/2019   ALKPHOS 49 09/09/2016   AST 16 07/28/2019   ALT 17 07/28/2019   PROT 7.6 07/28/2019   ALBUMIN 4.1 09/09/2016   CALCIUM 10.4 (H) 07/28/2019   GFRAA 79 07/28/2019   QFTBGOLDPLUS NEGATIVE 01/02/2020    Speciality Comments: PLQ Eye Exam: WNL 03/24/17 @ [facility] Prior therapy: Humira (facial rash)  Procedures:  No procedures performed Allergies: Fluoxetine, Hydrocodone, Humira [adalimumab], Hydrochlorothiazide, Hydrocodone-acetaminophen,  Hydroxychloroquine, Other, and Vicodin [hydrocodone-acetaminophen]   Assessment / Plan:     Visit Diagnoses: Rheumatoid arthritis involving multiple sites with positive rheumatoid factor ([place]) - +CCP, +RF: She has no joint tenderness or synovitis on exam.  She has been holding Enbrel and methotrexate since October 2021 due to recurrent infections.  She has been experiencing recurrent flares while off of her medications.  She was prescribed a prednisone taper on 02/22/2020 which alleviated her joint pain and swelling.  She has been taking Aleve as needed for pain relief.  She is not experiencing any joint pain or inflammation at this time.  She completed a course of azithromycin about 1 month ago for a URI.  She had a recent eye infection and completed a course of antibiotic eye drops.  She continues to have some sneezing, congestion, and eye irritation which she attributes to allergies.  She will need clearance by her ophthalmologist and her PCP prior to resuming Enbrel and methotrexate.  Since it has been longer than 3 months off of Enbrel she will need to restart Enbrel in the office in order to monitor her after the administration.  We will update CBC and CMP today prior to scheduling a nurse visit.  She will follow up in 3 months.  High risk medication use - She is currently holding Enbrel 50 mg sq weekly injections, MTX 0.8 ml sq once weekly, and folic acid 2 mg po daily.  Her last dose of Enbrel was in October 2021 due to recurrent infections.   (Humira caused facial rash). TB gold negative on 01/02/20 and will continue to be monitored yearly. CBC WNL on 01/02/20.  CBC and CMP updated today.  Her next lab work will be due in May and every 3 months.   - Plan: CBC with Differential/Platelet, COMPLETE METABOLIC PANEL WITH GFR She will need to return to the office to restart on Enbrel once she has been cleared by her ophthalmologist. She has received 2 pfizer covid-19 vaccine doses.  Discussed that once she  is eligible for the 3rd dose she is to hold MTX one week after the vaccine dose.   Primary osteoarthritis of both hands: She has no joint tenderness or inflammation on exam.  She is able to make a complete fist bilaterally.  Joint protection and muscle strengthening were discussed.   Primary osteoarthritis of both knees: She has good ROM with no discomfort.  No warmth or effusion of knee joints.  Bilateral knee crepitus.    Chondromalacia of both patellae: She has good ROM  with crepitus bilaterally.   Primary osteoarthritis of both feet: She is not experiencing any discomfort in her feet at this time.  She has good ROM with no tenderness or inflammation.  She is wearing proper fitting shoes.   DDD (degenerative disc disease), cervical: She has good ROM with no discomfort.  No symptoms of radiculopathy.   Other medical conditions are listed as follows:   Essential hypertension  Solitary pulmonary nodule  Cigarette smoker  History of migraine  History of gastroesophageal reflux (GERD)  TB lung, latent - Treated in the past.  Her TB Gold has been negative.  Orders: Orders Placed This Encounter  Procedures  . CBC with Differential/Platelet  . COMPLETE METABOLIC PANEL WITH GFR   No orders of the defined types were placed in this encounter.   Follow-Up Instructions: Return in about 3 months (around 06/30/2020) for Rheumatoid arthritis, Osteoarthritis.   Ofilia Neas, PA-C  Note - This record has been created using Dragon software.  Chart creation errors have been sought, but may not always  have been located. Such creation errors do not reflect on  the standard of medical care.

## 2020-04-02 ENCOUNTER — Other Ambulatory Visit: Payer: Self-pay

## 2020-04-02 ENCOUNTER — Encounter: Payer: Self-pay | Admitting: Physician Assistant

## 2020-04-02 ENCOUNTER — Ambulatory Visit (INDEPENDENT_AMBULATORY_CARE_PROVIDER_SITE_OTHER): Payer: Medicare Other | Admitting: Physician Assistant

## 2020-04-02 VITALS — BP 133/96 | HR 91 | Ht 63.5 in | Wt 176.6 lb

## 2020-04-02 DIAGNOSIS — Z8719 Personal history of other diseases of the digestive system: Secondary | ICD-10-CM

## 2020-04-02 DIAGNOSIS — R911 Solitary pulmonary nodule: Secondary | ICD-10-CM

## 2020-04-02 DIAGNOSIS — M170 Bilateral primary osteoarthritis of knee: Secondary | ICD-10-CM | POA: Diagnosis not present

## 2020-04-02 DIAGNOSIS — M0579 Rheumatoid arthritis with rheumatoid factor of multiple sites without organ or systems involvement: Secondary | ICD-10-CM | POA: Diagnosis not present

## 2020-04-02 DIAGNOSIS — M19042 Primary osteoarthritis, left hand: Secondary | ICD-10-CM

## 2020-04-02 DIAGNOSIS — I10 Essential (primary) hypertension: Secondary | ICD-10-CM

## 2020-04-02 DIAGNOSIS — M19072 Primary osteoarthritis, left ankle and foot: Secondary | ICD-10-CM

## 2020-04-02 DIAGNOSIS — Z8669 Personal history of other diseases of the nervous system and sense organs: Secondary | ICD-10-CM

## 2020-04-02 DIAGNOSIS — M19041 Primary osteoarthritis, right hand: Secondary | ICD-10-CM | POA: Diagnosis not present

## 2020-04-02 DIAGNOSIS — M2241 Chondromalacia patellae, right knee: Secondary | ICD-10-CM

## 2020-04-02 DIAGNOSIS — Z79899 Other long term (current) drug therapy: Secondary | ICD-10-CM

## 2020-04-02 DIAGNOSIS — Z227 Latent tuberculosis: Secondary | ICD-10-CM

## 2020-04-02 DIAGNOSIS — M19071 Primary osteoarthritis, right ankle and foot: Secondary | ICD-10-CM

## 2020-04-02 DIAGNOSIS — M2242 Chondromalacia patellae, left knee: Secondary | ICD-10-CM

## 2020-04-02 DIAGNOSIS — M5030 Other cervical disc degeneration, unspecified cervical region: Secondary | ICD-10-CM

## 2020-04-02 DIAGNOSIS — F17210 Nicotine dependence, cigarettes, uncomplicated: Secondary | ICD-10-CM

## 2020-04-03 LAB — COMPLETE METABOLIC PANEL WITH GFR
AG Ratio: 1.2 (calc) (ref 1.0–2.5)
ALT: 18 U/L (ref 6–29)
AST: 17 U/L (ref 10–30)
Albumin: 4.1 g/dL (ref 3.6–5.1)
Alkaline phosphatase (APISO): 54 U/L (ref 31–125)
BUN: 16 mg/dL (ref 7–25)
CO2: 23 mmol/L (ref 20–32)
Calcium: 9.6 mg/dL (ref 8.6–10.2)
Chloride: 106 mmol/L (ref 98–110)
Creat: 0.81 mg/dL (ref 0.50–1.10)
GFR, Est African American: 105 mL/min/{1.73_m2} (ref >=60)
GFR, Est Non African American: 91 mL/min/{1.73_m2} (ref >=60)
Globulin: 3.5 g/dL (calc) (ref 1.9–3.7)
Glucose, Bld: 79 mg/dL (ref 65–99)
Potassium: 4.2 mmol/L (ref 3.5–5.3)
Sodium: 138 mmol/L (ref 135–146)
Total Bilirubin: 0.3 mg/dL (ref 0.2–1.2)
Total Protein: 7.6 g/dL (ref 6.1–8.1)

## 2020-04-03 LAB — CBC WITH DIFFERENTIAL/PLATELET
Absolute Monocytes: 486 cells/uL (ref 200–950)
Basophils Absolute: 38 cells/uL (ref 0–200)
Basophils Relative: 0.5 %
Eosinophils Absolute: 813 cells/uL — ABNORMAL HIGH (ref 15–500)
Eosinophils Relative: 10.7 %
HCT: 41.5 % (ref 35.0–45.0)
Hemoglobin: 14.5 g/dL (ref 11.7–15.5)
Lymphs Abs: 2652 cells/uL (ref 850–3900)
MCH: 30.9 pg (ref 27.0–33.0)
MCHC: 34.9 g/dL (ref 32.0–36.0)
MCV: 88.3 fL (ref 80.0–100.0)
MPV: 10.5 fL (ref 7.5–12.5)
Monocytes Relative: 6.4 %
Neutro Abs: 3610 cells/uL (ref 1500–7800)
Neutrophils Relative %: 47.5 %
Platelets: 304 10*3/uL (ref 140–400)
RBC: 4.7 10*6/uL (ref 3.80–5.10)
RDW: 13.4 % (ref 11.0–15.0)
Total Lymphocyte: 34.9 %
WBC: 7.6 10*3/uL (ref 3.8–10.8)

## 2020-04-03 NOTE — Progress Notes (Signed)
Absolute eosinophils are elevated-813.  WBC count is WNL.  Pt is getting allergy shots weekly at her allergist office.  CMP WNL.

## 2020-04-10 ENCOUNTER — Other Ambulatory Visit: Payer: Self-pay | Admitting: *Deleted

## 2020-04-10 ENCOUNTER — Other Ambulatory Visit: Payer: Self-pay | Admitting: Physician Assistant

## 2020-04-10 MED ORDER — PREDNISONE 5 MG PO TABS: ORAL_TABLET | ORAL | 0 refills | Status: DC

## 2020-04-10 MED ORDER — PREDNISONE 5 MG PO TABS
ORAL_TABLET | ORAL | 0 refills | Status: DC
Start: 2020-04-10 — End: 2020-04-10

## 2020-04-10 MED FILL — predniSONE 5 MG TABS: 5 | 16 days supply | Qty: 40 | Fill #0

## 2020-04-10 NOTE — Telephone Encounter (Signed)
Ok to send in prednisone taper starting at 20 mg tapering by 5 mg every 4 days.

## 2020-04-10 NOTE — Addendum Note (Signed)
Addended by: Carole Binning on: 04/10/2020 05:00 PM   Modules accepted: Orders

## 2020-04-10 NOTE — Telephone Encounter (Signed)
Patient states she is having trouble raising her right arm. Patient states she is having pain and swelling in her bilateral hands. Patient is requesting a prescription for Prednisone.  Patient states she is not taking the Enbrel or the MTX as she has been on antibiotics. Requested to have sent to [facility].  Please advise.

## 2020-04-10 NOTE — Addendum Note (Signed)
Addended by: Carole Binning on: 04/10/2020 04:40 PM   Modules accepted: Orders

## 2020-04-12 ENCOUNTER — Telehealth: Payer: Self-pay

## 2020-04-12 NOTE — Telephone Encounter (Signed)
Patient requested a return call regarding her Prednisone prescription.  Patient states she is having a "problem being able to pick it up at the pharmacy."

## 2020-04-12 NOTE — Telephone Encounter (Signed)
Patient states she was able to get her prescription from the pharmacy.

## 2020-05-22 ENCOUNTER — Other Ambulatory Visit: Payer: Self-pay | Admitting: Rheumatology

## 2020-05-22 MED ORDER — FOLIC ACID 1 MG PO TABS
2.0000 mg | ORAL_TABLET | Freq: Every day | ORAL | 3 refills | Status: DC
Start: 2020-05-22 — End: 2020-07-16

## 2020-05-22 MED ORDER — METHOTREXATE SODIUM CHEMO INJECTION 50 MG/2ML
INTRAMUSCULAR | 2 refills | Status: DC
Start: 2020-05-22 — End: 2021-11-07

## 2020-05-22 NOTE — Telephone Encounter (Signed)
Next Visit: 07/03/2020  Last Visit: 04/02/2020  Last Fill: 03/01/9731 MTX, folic acid 01/28/870  DX: Rheumatoid arthritis involving multiple sites with positive rheumatoid factor   Current Dose per office note 04/02/2020, MTX 0.8 ml sq once weekly, and folic acid 2 mg po daily.  Labs: 04/02/2020, Absolute eosinophils are elevated-813. WBC count is WNL. Pt is getting allergy shots weekly at her allergist office.  CMP WNL.   Okay to refill MTX and folic acid?

## 2020-05-25 ENCOUNTER — Other Ambulatory Visit (HOSPITAL_COMMUNITY): Payer: Self-pay

## 2020-06-09 ENCOUNTER — Other Ambulatory Visit: Payer: Self-pay

## 2020-06-09 ENCOUNTER — Ambulatory Visit (HOSPITAL_COMMUNITY)
Admission: EM | Admit: 2020-06-09 | Discharge: 2020-06-09 | Disposition: A | Discharge status: Home / Self Care | Payer: Medicare Other | Attending: Urgent Care | Admitting: Urgent Care

## 2020-06-09 ENCOUNTER — Encounter (HOSPITAL_COMMUNITY): Payer: Self-pay

## 2020-06-09 DIAGNOSIS — M5020 Other cervical disc displacement, unspecified cervical region: Secondary | ICD-10-CM | POA: Diagnosis not present

## 2020-06-09 DIAGNOSIS — M542 Cervicalgia: Secondary | ICD-10-CM

## 2020-06-09 DIAGNOSIS — M5412 Radiculopathy, cervical region: Secondary | ICD-10-CM | POA: Diagnosis not present

## 2020-06-09 DIAGNOSIS — M5030 Other cervical disc degeneration, unspecified cervical region: Secondary | ICD-10-CM

## 2020-06-09 MED ORDER — METHYLPREDNISOLONE ACETATE 80 MG/ML IJ SUSP
80.0000 mg | Freq: Once | INTRAMUSCULAR | Status: AC
  Administered 2020-06-09: 80 mg via INTRAMUSCULAR

## 2020-06-09 MED ORDER — TIZANIDINE HCL 4 MG PO TABS: 4.0000 mg | ORAL_TABLET | Freq: Every day | ORAL | 0 refills | Status: DC

## 2020-06-09 MED ORDER — METHYLPREDNISOLONE ACETATE 80 MG/ML IJ SUSP
INTRAMUSCULAR | Status: AC
  Filled 2020-06-09: qty 1

## 2020-06-09 NOTE — ED Provider Notes (Signed)
[place]   MRN: [id number] DOB: 1980/10/18  Subjective:   Elizabeth Pope is a 40 y.o. female presenting for 1 week history of recurrent neck pain, radiates into her right arm and hand, has intermittent tingling sensations. Has a history of degenerative disc disease in cervical region, bulging disc. Also has history of RA.  No fever, trauma, falls. Has never seen a spine specialist.    No current facility-administered medications for this encounter.  Current Outpatient Medications:  .  acetaminophen (TYLENOL) 325 MG tablet, Take 650 mg by mouth every 6 (six) hours as needed., Disp: , Rfl:  .  albuterol (VENTOLIN HFA) 108 (90 Base) MCG/ACT inhaler, Inhale 1-2 puffs into the lungs every 6 (six) hours as needed for wheezing or shortness of breath., Disp: 8 g, Rfl: 0 .  amLODipine (NORVASC) 5 MG tablet, Take 1 tablet by mouth daily. Reported on 08/27/2015, Disp: , Rfl: 5 .  benzonatate (TESSALON) 100 MG capsule, Take 1 capsule (100 mg total) by mouth 3 (three) times daily as needed for cough., Disp: 21 capsule, Rfl: 0 .  cetirizine (ZYRTEC) 10 MG tablet, TK 1 T PO D PRN, Disp: , Rfl: 0 .  EPINEPHrine 0.3 mg/0.3 mL IJ SOAJ injection, Inject into the muscle as directed., Disp: , Rfl:  .  ergocalciferol (VITAMIN D2) 1.25 MG (50000 UT) capsule, Take 50,000 Units by mouth once a week., Disp: , Rfl:  .  esomeprazole (NEXIUM) 20 MG capsule, 1 capsule, Disp: , Rfl:  .  etanercept (ENBREL SURECLICK) 50 MG/ML injection, INJECT 0.98 MLS (50 MG TOTAL) INTO THE SKIN ONCE A WEEK., Disp: 12 pen, Rfl: 0 .  Fluticasone-Salmeterol (ADVAIR) 100-50 MCG/DOSE AEPB, Inhale 1 puff into the lungs 2 (two) times daily., Disp: , Rfl:  .  folic acid (FOLVITE) 1 MG tablet, Take 2 tablets (2 mg total) by mouth daily., Disp: 180 tablet, Rfl: 3 .  hydrOXYzine (ATARAX/VISTARIL) 25 MG tablet, Take 25 mg by mouth 3 (three) times daily as needed., Disp: , Rfl:  .  Ketotifen Fumarate (ALLERGY EYE DROPS  OP), Apply to eye daily as needed., Disp: , Rfl:  .  lamoTRIgine (LAMICTAL) 100 MG tablet, Take 100 mg daily by mouth., Disp: , Rfl:  .  losartan (COZAAR) 50 MG tablet, Take 50 mg by mouth daily., Disp: , Rfl:  .  methotrexate 50 MG/2ML injection, ADMINISTER 0.8 ML(20 MG) UNDER THE SKIN 1 TIME A WEEK, Disp: 4 mL, Rfl: 2 .  montelukast (SINGULAIR) 10 MG tablet, Take 10 mg by mouth at bedtime., Disp: , Rfl:  .  predniSONE (DELTASONE) 5 MG tablet, TAKE 4 TABLETS BY MOUTH FOR 4 DAYS, THEN 3 TABLETS FOR 4 DAYS, THEN 2 TABLETS FOR 4 DAYS, THEN 1 TABLET FOR 4 DAYS., Disp: 40 tablet, Rfl: 0 .  SAFETY-LOK TB SYRINGE 27GX.5" 27G X 1/2" 1 ML MISC, USE ONCE WEEKLY, Disp: 12 each, Rfl: 3 .  SAFETY-LOK TB SYRINGE 27GX.5" 27G X 1/2" 1 ML MISC, USE ONCE WEEKLY, Disp: 12 each, Rfl: 3 .  sertraline (ZOLOFT) 100 MG tablet, Take 100 mg daily by mouth., Disp: , Rfl: 1 .  traZODone (DESYREL) 50 MG tablet, Take 100 mg by mouth at bedtime as needed. , Disp: , Rfl: 1   Allergies  Allergen Reactions  . Fluoxetine     Muscle spasms Other reaction(s): Other (See Comments) Muscle spasms  . Hydrocodone Itching and Other (See Comments)  . Adalimumab Rash and Other (See Comments)  .  Hydrochlorothiazide Rash and Other (See Comments)  . Hydrocodone-Acetaminophen Itching  . Hydroxychloroquine Diarrhea    diarrhea diarrhea  . Other Rash  . Vicodin [Hydrocodone-Acetaminophen] Itching    Past Medical History:  Diagnosis Date  . Anxiety   . Bipolar 1 disorder ([place])   . DDD (degenerative disc disease), cervical 12/25/2015  . Depression   . GERD (gastroesophageal reflux disease) 12/25/2015  . Hypertension 12/25/2015  . Migraine   . Migraines 12/25/2015  . OCD (obsessive compulsive disorder)   . 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/2015   mild  . Osteoarthritis of both hands 12/25/2015  . PTSD (post-traumatic stress disorder)   . RA (rheumatoid arthritis) ([place]) 12/25/2015   Positive RF, Elevated ESR, Positive CCP >  250      Past Surgical History:  Procedure Laterality Date  . CESAREAN SECTION    . TUBAL LIGATION      Family History  Problem Relation Age of Onset  . Rheum arthritis Mother   . Migraines Mother   . Hypertension Mother   . Colitis Mother   . Rheum arthritis Maternal Grandmother   . Rheum arthritis Maternal Aunt   . Migraines Sister   . Migraines Brother   . Sickle cell anemia Daughter   . Bipolar disorder Daughter   . Anxiety disorder Daughter   . Depression Daughter   . Migraines Daughter   . Arrhythmia Daughter   . Migraines Son   . ADD / ADHD Son   . Seizures Son   . Arrhythmia Son     Social History   Tobacco Use  . Smoking status: Current Some Day Smoker    Packs/day: 0.50    Years: 19.00    Pack years: 9.50    Types: Cigars  . Smokeless tobacco: Never Used  . Tobacco comment: former cigarette smoker  Vaping Use  . Vaping Use: Former  Substance Use Topics  . Alcohol use: Yes    Alcohol/week: 0.0 standard drinks    Comment: occasional  . Drug use: No    ROS   Objective:   Vitals: BP (!) 128/95 (BP Location: Left Arm)   Pulse 82   Temp 98.3 F (36.8 C)   Resp 18   SpO2 96%   Physical Exam Constitutional:      General: She is not in acute distress.    Appearance: Normal appearance. She is well-developed. She is not ill-appearing, toxic-appearing or diaphoretic.  HENT:     Head: Normocephalic and atraumatic.     Right Ear: External ear normal.     Left Ear: External ear normal.     Nose: Nose normal.     Mouth/Throat:     Mouth: Mucous membranes are moist.     Pharynx: Oropharynx is clear.  Eyes:     General: No scleral icterus.       Right eye: No discharge.        Left eye: No discharge.     Extraocular Movements: Extraocular movements intact.     Conjunctiva/sclera: Conjunctivae normal.     Pupils: Pupils are equal, round, and reactive to light.  Cardiovascular:     Rate and Rhythm: Normal rate.  Pulmonary:     Effort:  Pulmonary effort is normal.  Skin:    General: Skin is warm and dry.  Neurological:     General: No focal deficit present.     Mental Status: She is alert and oriented to person, place, and time.  Motor: No weakness.     Coordination: Coordination normal.     Gait: Gait normal.     Deep Tendon Reflexes: Reflexes normal.  Psychiatric:        Mood and Affect: Mood normal.        Behavior: Behavior normal.        Thought Content: Thought content normal.        Judgment: Judgment normal.     Assessment and Plan :   PDMP not reviewed this encounter.  1. Neck pain   2. Cervical radiculopathy   3. Bulging of cervical intervertebral disc     Will use Depomedrol injection, deferred imaging given her history of bulging disc, cervical DDD. Recommended follow up with [facility]. Counseled patient on potential for adverse effects with medications prescribed/recommended today, ER and return-to-clinic precautions discussed, patient verbalized understanding.    Jaynee Eagles, PA-C 06/10/20 1002

## 2020-06-09 NOTE — ED Triage Notes (Signed)
Pt presents with right shoulder pain x 1 week, numbness and tingling in right arm and right hand since last night. Aleve arthritis gives no relief.

## 2020-06-09 NOTE — Discharge Instructions (Signed)
Please just use Tylenol at a dose of 500mg -650mg  once every 6 hours as needed for your neck pain. Do not use any nonsteroidal anti-inflammatories (NSAIDs) like ibuprofen, Motrin, naproxen, Aleve, etc. which are all available over-the-counter.

## 2020-06-11 ENCOUNTER — Telehealth: Payer: Self-pay | Admitting: Rheumatology

## 2020-06-11 NOTE — Telephone Encounter (Signed)
Patient has been off of antibiotics for about 2 months now, and wants to know when she can get set up for Enbrel, and MTX again. Patient states she has been off medication for so long (Enbrel / MTX) she was going to have to come back in to start, and be watched. Please call to advise.

## 2020-06-11 NOTE — Telephone Encounter (Signed)
Enbrel PA valid through 02/23/21

## 2020-06-12 NOTE — Telephone Encounter (Signed)
Spoke with patient and discussed that after discussion w/ Lovena Le, we would still like clearance by her PCP and/or ophthalmologist. She states her last infection was months ago. She last saw PCP, Dr. Kristie Cowman, "the other week." Her ophthalmologist was Dr. Maryjane Hurter at [facility].  Called Dr. Marin Comment - clinic stated patient was last seen August 2021 and had eye exam but no antibiotics were prescribed.  Called Dr. Ronnald Ramp' office and left message with receptionist to request clearance to resume Enbrel. Will f/u  Knox Saliva, PharmD, MPH Clinical Pharmacist (Rheumatology and Pulmonology)

## 2020-06-14 ENCOUNTER — Other Ambulatory Visit: Payer: Self-pay | Admitting: Rheumatology

## 2020-06-14 MED ORDER — PREDNISONE 5 MG PO TABS: ORAL_TABLET | ORAL | 0 refills | Status: DC

## 2020-06-14 NOTE — Telephone Encounter (Signed)
Patient calling because she is having a lot of pain, and would like to know if there is anything doctor can give her until she is able to restart Enbrel? Elizabeth Pope is working on Control and instrumentation engineer for patient from [place] doctor to restart medication, but has not received okay as of yet. Patient uses [facility] on [address]. Please call to advise.

## 2020-06-14 NOTE — Telephone Encounter (Signed)
Advised patient we are sending in prednisone taper. Patient verbalized understanding.   Please review taper and send prescription to [facility].Thanks!

## 2020-06-14 NOTE — Telephone Encounter (Signed)
Called PCP's office again to request clearance for patient to resume Enbrel.  Do not need clearance from ophthalmologist since most recent provider seen was PCP. Requested they send notice to our clinic since they reached out to the patient.  Knox Saliva, PharmD, MPH Clinical Pharmacist (Rheumatology and Pulmonology)

## 2020-06-14 NOTE — Telephone Encounter (Signed)
Ok to send in a prednisone taper starting at 20 mg tapering by 5 mg every 4 days.   Dev[place]- Please let me know once we have received clearance to restart Enbrel and MTX.

## 2020-06-15 NOTE — Telephone Encounter (Signed)
Dr. Ronnald Ramp called to let you know he never advised patient to stop Enbrel, nor wanted her to. He gave her antibiotics last in 2020, nothing recent. Not sure what else you would need from him, but if there is please call his office.

## 2020-06-15 NOTE — Telephone Encounter (Signed)
Noted. Called patient to schedule Enbrel new start but states she no longer thinks her runny nose is from allergies since she is having mucus now. She is planning to make appt to see PCP. Advised we will hold on starting Enbrel until she feels better. Advised that prednisone was sent in for her.  She voiced understanding

## 2020-06-15 NOTE — Telephone Encounter (Signed)
Received clearance from PCP Dr. Kristie Cowman today however patient states she feels as if she has a cold/infection (symptoms have worsened in past couple of days. Initially thought it was allergies but symptoms have escalated). She will be making appt today to see PCP  She will reach out once she feels better

## 2020-06-19 NOTE — Progress Notes (Signed)
Office Visit Note  Patient: Elizabeth Pope             Date of Birth: 1980-06-25           MRN: [id number]             PCP: Kristie Cowman, MD Referring: Kristie Cowman, MD Visit Date: 07/03/2020 Occupation: @GUAROCC @  Subjective:  Increased joint pain and swelling.   History of Present Illness: Elizabeth Pope is a 40 y.o. female with the history of seropositive rheumatoid arthritis and osteoarthritis.  She has been off Enbrel and methotrexate since October 2021 due to recurrent infections.  She has been treated with azithromycin for upper respiratory tract infections and also was using eyedrops.  She had a prednisone taper in December 2021.  She has not had any infections since February 2022.  She was seen by her PCP in April and had the clearance to restart on the medications.  She states she had a severe flare last weekend and is started a prednisone taper.  Today she is feeling better while she is on prednisone.  She states last Saturday she started having pain and swelling in her both hands and discomfort in her both hip joints.  The symptoms improved on prednisone.  Activities of Daily Living:  Patient reports morning stiffness for 1 hour.   Patient Denies nocturnal pain.  Difficulty dressing/grooming: Reports Difficulty climbing stairs: Reports Difficulty getting out of chair: Reports Difficulty using hands for taps, buttons, cutlery, and/or writing: Reports  Review of Systems  Constitutional: Positive for fatigue. Negative for night sweats, weight gain and weight loss.  HENT: Positive for mouth dryness. Negative for mouth sores, trouble swallowing, trouble swallowing and nose dryness.   Eyes: Positive for redness, itching and dryness. Negative for pain and visual disturbance.  Respiratory: Negative for cough, shortness of breath and difficulty breathing.   Cardiovascular: Negative for chest pain, palpitations, hypertension, irregular heartbeat and swelling in  legs/feet.  Gastrointestinal: Positive for diarrhea. Negative for blood in stool and constipation.  Endocrine: Positive for increased urination.  Genitourinary: Negative for difficulty urinating and vaginal dryness.  Musculoskeletal: Positive for arthralgias, joint pain and morning stiffness. Negative for joint swelling, myalgias, muscle weakness, muscle tenderness and myalgias.  Skin: Negative for color change, rash, hair loss, redness, skin tightness, ulcers and sensitivity to sunlight.  Allergic/Immunologic: Negative for susceptible to infections.  Neurological: Negative for dizziness, numbness, headaches, memory loss, night sweats and weakness.  Hematological: Negative for bruising/bleeding tendency and swollen glands.  Psychiatric/Behavioral: Positive for depressed mood and sleep disturbance. Negative for confusion. The patient is nervous/anxious.     PMFS History:  Patient Active Problem List   Diagnosis Date Noted  . Asthma with acute exacerbation 09/12/2018  . 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 Pelvic pain 06/02/2017  . Atypical squamous cell changes of undetermined significance (ASCUS) on cervical cytology with negative high risk human papilloma virus (HPV) test result 02/03/2017  . Encounter for IUD insertion 01/28/2017  . Uterine fibroid 01/21/2017  . H/O tubal ligation 06/24/2016  . High risk medication use 01/07/2016  . GERD (gastroesophageal reflux disease) 12/25/2015  . Migraines 12/25/2015  . Hypertension 12/25/2015  . RA (rheumatoid arthritis) ([place]) 12/25/2015  . DDD (degenerative disc disease), cervical 12/25/2015  . Osteoarthritis of both hands 12/25/2015  . 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/2015  . 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 Bipolar 2 disorder ([place]) 05/14/2015  . Cigarette smoker  05/05/2015  . Chest pain 05/05/2015  . Solitary pulmonary nodule 05/04/2015    Past Medical History:  Diagnosis Date   . Anxiety   . Bipolar 1 disorder ([place])   . DDD (degenerative disc disease), cervical 12/25/2015  . Depression   . GERD (gastroesophageal reflux disease) 12/25/2015  . Hypertension 12/25/2015  . Migraine   . Migraines 12/25/2015  . OCD (obsessive compulsive disorder)   . 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/2015   mild  . Osteoarthritis of both hands 12/25/2015  . PTSD (post-traumatic stress disorder)   . RA (rheumatoid arthritis) ([facility]) 12/25/2015   Positive RF, Elevated ESR, Positive CCP > 250     Family History  Problem Relation Age of Onset  . Rheum arthritis Mother   . Migraines Mother   . Hypertension Mother   . Colitis Mother   . Rheum arthritis Maternal Grandmother   . Rheum arthritis Maternal Aunt   . Migraines Sister   . Migraines Brother   . Sickle cell anemia Daughter   . Bipolar disorder Daughter   . Anxiety disorder Daughter   . Depression Daughter   . Migraines Daughter   . Arrhythmia Daughter   . Migraines Son   . ADD / ADHD Son   . Seizures Son   . Arrhythmia Son    Past Surgical History:  Procedure Laterality Date  . CESAREAN SECTION    . TUBAL LIGATION     Social History   Social History Narrative  . Not on file   Immunization History  Administered Date(s) Administered  . PFIZER(Purple Top)SARS-COV-2 Vaccination 01/05/2020, 01/26/2020     Objective: Vital Signs: BP 121/90 (BP Location: Left Arm, Patient Position: Sitting, Cuff Size: Normal)   Pulse 73   Resp 15   Ht 5' 4"  (1.626 m)   Wt 176 lb 12.8 oz (80.2 kg)   BMI 30.35 kg/m    Physical Exam Vitals and nursing note reviewed.  Constitutional:      Appearance: She is well-developed.  HENT:     Head: Normocephalic and atraumatic.  Eyes:     Comments: conjunctival injection was noted.  Cardiovascular:     Rate and Rhythm: Normal rate and regular rhythm.     Heart sounds: Normal heart sounds.  Pulmonary:     Effort: Pulmonary effort is normal.     Breath sounds: Normal breath  sounds.  Abdominal:     General: Bowel sounds are normal.     Palpations: Abdomen is soft.  Musculoskeletal:     Cervical back: Normal range of motion.  Lymphadenopathy:     Cervical: No cervical adenopathy.  Skin:    General: Skin is warm and dry.     Capillary Refill: Capillary refill takes less than 2 seconds.  Neurological:     Mental Status: She is alert and oriented to person, place, and time.  Psychiatric:        Behavior: Behavior normal.      Musculoskeletal Exam: C-spine thoracic and lumbar spine with good range of motion.  Shoulder joints,  elbow joints, wrist joints, MCPs PIPs and DIPs with good range of motion with no synovitis.  Hip joints, knee joints, ankles, MTPs and PIPs with good range of motion with no synovitis.  CDAI Exam: CDAI Score: 0.8  Patient Global: 4 mm; Provider Global: 4 mm Swollen: 0 ; Tender: 0  Joint Exam 07/03/2020   No joint exam has been documented for this visit   There is currently no information  documented on the homunculus. Go to the Rheumatology activity and complete the homunculus joint exam.  Investigation: No additional findings.  Imaging: No results found.  Recent Labs: Lab Results  Component Value Date   WBC 7.6 04/02/2020   HGB 14.5 04/02/2020   PLT 304 04/02/2020   NA 138 04/02/2020   K 4.2 04/02/2020   CL 106 04/02/2020   CO2 23 04/02/2020   GLUCOSE 79 04/02/2020   BUN 16 04/02/2020   CREATININE 0.81 04/02/2020   BILITOT 0.3 04/02/2020   ALKPHOS 49 09/09/2016   AST 17 04/02/2020   ALT 18 04/02/2020   PROT 7.6 04/02/2020   ALBUMIN 4.1 09/09/2016   CALCIUM 9.6 04/02/2020   GFRAA 105 04/02/2020   QFTBGOLDPLUS NEGATIVE 01/02/2020    Speciality Comments: PLQ Eye Exam: WNL 03/24/17 @ [facility] Prior therapy: Humira (facial rash)  Procedures:  No procedures performed Allergies: Fluoxetine, Hydrocodone, Adalimumab, Hydrochlorothiazide, Hydrocodone-acetaminophen, Hydroxychloroquine, Other, and Vicodin  [hydrocodone-acetaminophen]   Assessment / Plan:     Visit Diagnoses: Rheumatoid arthritis involving multiple sites with positive rheumatoid factor ([place]) - +CCP, +RF: Patient has been off Enbrel and methotrexate since November 2021 due to frequent infections.  She is also had frequent flares since then.  She reports having 3 courses of prednisone taper the last 1 was last weekend.  She also had intramuscular steroid injection once since then.  During the last flare she developed pain and swelling in her hands and also discomfort in the bilateral hips.  She states the swelling and discomfort resolved after starting prednisone.  She had no synovitis on examination today.  We will give Enbrel subcutaneous 50 mg in the office today.  We will observe her in the office for 30 minutes as it has been more than 6 months since she had the last Enbrel injection.  I also advised her to resume methotrexate weekly injections.  She was seen by her PCP last month and got clearance to resume immunosuppression.  High risk medication use - Enbrel 50 mg sq weekly injections, MTX 0.8 ml sq once weekly, and folic acid 2 mg po daily. - Plan: CBC with Differential/Platelet, COMPLETE METABOLIC PANEL WITH GFR today, 1 month and then every 3 months.  She was advised to stop Enbrel and methotrexate in case she develops infection.  She may resume medications once infection resolves.  She was also advised to go to the dermatologist on a yearly basis to screen for known melanoma skin cancer.  Advise regarding immunization was also given and instructions were placed in the AVS.  Redness of both eyes-she has been going to [facility] her [facility] examination.  I am concerned that she may have underlying iritis or inflammation secondary to rheumatoid arthritis.  I detailed discussion with the patient.  I will refer her to ophthalmology.  Primary osteoarthritis of both hands-she has mild osteoarthritic changes.  Primary osteoarthritis of both  knees-she currently has no discomfort  Chondromalacia of both patellae-denies any knee pain.-  Primary osteoarthritis of both feet-she has been using proper fitting shoes which has been helpful.  DDD (degenerative disc disease), cervical-she good range of motion without discomfort.  Essential hypertension-her blood pressure is normal today.  Increased risk of heart disease with rheumatoid arthritis was also discussed.  Dietary modifications and exercise was emphasized.  Solitary pulmonary nodule  Cigarette smoker-smoking cessation was advised.  History of migraine  History of gastroesophageal reflux (GERD)  TB lung, latent - Treated in the past.  Her TB Gold  has been negative.  Orders: Orders Placed This Encounter  Procedures  . CBC with Differential/Platelet  . COMPLETE METABOLIC PANEL WITH GFR  . Ambulatory referral to Ophthalmology   Meds ordered this encounter  Medications  . etanercept (ENBREL SURECLICK) 50 MG/ML injection    Sig: INJECT 0.98 MLS (50 MG TOTAL) INTO THE SKIN ONCE A WEEK.    Dispense:  12 mL    Refill:  0   Patient was observed in the office for 30 minutes and she did not develop any adverse effects.  Follow-Up Instructions: Return in about 3 months (around 10/03/2020) for Rheumatoid arthritis, Osteoarthritis.   Bo Merino, MD  Note - This record has been created using Editor, commissioning.  Chart creation errors have been sought, but may not always  have been located. Such creation errors do not reflect on  the standard of medical care.

## 2020-06-22 NOTE — Telephone Encounter (Signed)
Spoke with patient who believes the symptoms are d/t her allergies now but remains unsure. She states she will make appt with PCP next week to ensure there is no infection.  She will f/u  Knox Saliva, PharmD, MPH Clinical Pharmacist (Rheumatology and Pulmonology)

## 2020-07-03 ENCOUNTER — Encounter: Payer: Self-pay | Admitting: Rheumatology

## 2020-07-03 ENCOUNTER — Other Ambulatory Visit (HOSPITAL_COMMUNITY): Payer: Self-pay

## 2020-07-03 ENCOUNTER — Ambulatory Visit (INDEPENDENT_AMBULATORY_CARE_PROVIDER_SITE_OTHER): Payer: Medicare Other | Admitting: Rheumatology

## 2020-07-03 ENCOUNTER — Other Ambulatory Visit: Payer: Self-pay

## 2020-07-03 VITALS — BP 121/90 | HR 73 | Resp 15 | Ht 64.0 in | Wt 176.8 lb

## 2020-07-03 DIAGNOSIS — M5030 Other cervical disc degeneration, unspecified cervical region: Secondary | ICD-10-CM

## 2020-07-03 DIAGNOSIS — M19041 Primary osteoarthritis, right hand: Secondary | ICD-10-CM | POA: Diagnosis not present

## 2020-07-03 DIAGNOSIS — Z79899 Other long term (current) drug therapy: Secondary | ICD-10-CM | POA: Diagnosis not present

## 2020-07-03 DIAGNOSIS — M0579 Rheumatoid arthritis with rheumatoid factor of multiple sites without organ or systems involvement: Secondary | ICD-10-CM

## 2020-07-03 DIAGNOSIS — M19071 Primary osteoarthritis, right ankle and foot: Secondary | ICD-10-CM

## 2020-07-03 DIAGNOSIS — M2241 Chondromalacia patellae, right knee: Secondary | ICD-10-CM

## 2020-07-03 DIAGNOSIS — M2242 Chondromalacia patellae, left knee: Secondary | ICD-10-CM

## 2020-07-03 DIAGNOSIS — H5789 Other specified disorders of eye and adnexa: Secondary | ICD-10-CM | POA: Diagnosis not present

## 2020-07-03 DIAGNOSIS — M19072 Primary osteoarthritis, left ankle and foot: Secondary | ICD-10-CM

## 2020-07-03 DIAGNOSIS — M19042 Primary osteoarthritis, left hand: Secondary | ICD-10-CM

## 2020-07-03 DIAGNOSIS — F17210 Nicotine dependence, cigarettes, uncomplicated: Secondary | ICD-10-CM

## 2020-07-03 DIAGNOSIS — Z8669 Personal history of other diseases of the nervous system and sense organs: Secondary | ICD-10-CM

## 2020-07-03 DIAGNOSIS — Z8719 Personal history of other diseases of the digestive system: Secondary | ICD-10-CM

## 2020-07-03 DIAGNOSIS — I10 Essential (primary) hypertension: Secondary | ICD-10-CM

## 2020-07-03 DIAGNOSIS — Z227 Latent tuberculosis: Secondary | ICD-10-CM

## 2020-07-03 DIAGNOSIS — M170 Bilateral primary osteoarthritis of knee: Secondary | ICD-10-CM

## 2020-07-03 DIAGNOSIS — R911 Solitary pulmonary nodule: Secondary | ICD-10-CM

## 2020-07-03 MED ORDER — ENBREL SURECLICK 50 MG/ML ~~LOC~~ SOAJ
SUBCUTANEOUS | 0 refills | Status: DC
Start: 2020-07-03 — End: 2020-11-14
  Filled 2020-07-03: qty 12, fill #0
  Filled 2020-07-04: qty 4, 28d supply, fill #0
  Filled 2020-08-10: qty 4, 28d supply, fill #1
  Filled 2020-09-07: qty 4, 28d supply, fill #2

## 2020-07-03 NOTE — Progress Notes (Signed)
Pharmacy Note  Subjective:   Patient presents to clinic today to restart enbrel sureclick.  Patient running a fever or have signs/symptoms of infection? No  Patient currently on antibiotics for the treatment of infection? No  Patient have any upcoming invasive procedures/surgeries? No  Objective: CMP     Component Value Date/Time   NA 138 04/02/2020 1335   NA 139 06/24/2016 1104   K 4.2 04/02/2020 1335   CL 106 04/02/2020 1335   CO2 23 04/02/2020 1335   GLUCOSE 79 04/02/2020 1335   BUN 16 04/02/2020 1335   BUN 14 06/24/2016 1104   CREATININE 0.81 04/02/2020 1335   CALCIUM 9.6 04/02/2020 1335   PROT 7.6 04/02/2020 1335   PROT 7.3 06/24/2016 1104   ALBUMIN 4.1 09/09/2016 1646   ALBUMIN 3.9 06/24/2016 1104   AST 17 04/02/2020 1335   ALT 18 04/02/2020 1335   ALKPHOS 49 09/09/2016 1646   BILITOT 0.3 04/02/2020 1335   BILITOT 0.2 06/24/2016 1104   GFRNONAA 91 04/02/2020 1335   GFRAA 105 04/02/2020 1335    CBC    Component Value Date/Time   WBC 7.6 04/02/2020 1335   RBC 4.70 04/02/2020 1335   HGB 14.5 04/02/2020 1335   HGB 11.8 06/24/2016 1104   HCT 41.5 04/02/2020 1335   HCT 34.1 06/24/2016 1104   PLT 304 04/02/2020 1335   PLT 342 06/24/2016 1104   MCV 88.3 04/02/2020 1335   MCV 85 06/24/2016 1104   MCH 30.9 04/02/2020 1335   MCHC 34.9 04/02/2020 1335   RDW 13.4 04/02/2020 1335   RDW 15.2 06/24/2016 1104   LYMPHSABS 2,652 04/02/2020 1335   LYMPHSABS 2.0 06/24/2016 1104   MONOABS 438 09/09/2016 1646   EOSABS 813 (H) 04/02/2020 1335   EOSABS 0.4 06/24/2016 1104   BASOSABS 38 04/02/2020 1335   BASOSABS 0.0 06/24/2016 1104    Baseline Immunosuppressant Therapy Labs TB GOLD Quantiferon TB Gold Latest Ref Rng & Units 01/02/2020  Quantiferon TB Gold Plus NEGATIVE NEGATIVE   Hepatitis Panel Hepatitis Latest Ref Rng & Units 03/12/2018  Hep B Surface Ag Negative Negative   HIV Lab Results  Component Value Date   HIV Non Reactive 03/12/2018   HIV Non Reactive  06/24/2016   Immunoglobulins   SPEP Serum Protein Electrophoresis Latest Ref Rng & Units 04/02/2020  Total Protein 6.1 - 8.1 g/dL 7.6   G6PD No results found for: G6PDH TPMT No results found for: TPMT   Chest x-ray: 01/30/2020 No active cardiopulmonary disease.  Assessment/Plan:  Patient self injected in the right thigh with:  Sample Medication: enbrel sureclick NDC: 56314-970-26 Lot: [id number] Expiration: 06/23/2021  Patient tolerated well.  Observed for 30 mins in office for adverse reaction and no reaction noted.   Patient is to return in 1 month for labs.  Standing orders are in place.   Prescription sent to [facility].  All questions encouraged and answered.  Instructed patient to call with any further questions or concerns.

## 2020-07-03 NOTE — Patient Instructions (Addendum)
Standing Labs We placed an order today for your standing lab work.   Please have your standing labs drawn in June and every 3 months  If possible, please have your labs drawn 2 weeks prior to your appointment so that the provider can discuss your results at your appointment.  We have open lab daily Monday through Thursday from 1:30-4:30 PM and Friday from 1:30-4:00 PM at the office of Dr. Bo Merino, [facility].   Please be advised, all patients with office appointments requiring lab work will take precedents over walk-in lab work.  If possible, please come for your lab work on Monday and Friday afternoons, as you may experience shorter wait times. The office is located at [address], [place], [place], [place] [address] No appointment is necessary.   Labs are drawn by [facility]. Please bring your co-pay at the time of your lab draw.  You may receive a bill from [facility] for your lab work.  If you wish to have your labs drawn at another location, please call the office 24 hours in advance to send orders.  If you have any questions regarding directions or hours of operation,  please call [phone].   As a reminder, please drink plenty of water prior to coming for your lab work. Thanks!  Vaccines You are taking a medication(s) that can suppress your immune system.  The following immunizations are recommended: . Flu annually . Covid-19  . Pneumonia (Pneumovax 23 and Prevnar 13 spaced at least 1 year apart) . Shingrix (after age 30)  Please check with your PCP to make sure you are up to date.  Heart Disease Prevention   Your inflammatory disease increases your risk of heart disease which includes heart attack, stroke, atrial fibrillation (irregular heartbeats), high blood pressure, heart failure and atherosclerosis (plaque in the arteries).  It is important to reduce your risk by:   . Keep blood pressure, cholesterol, and blood sugar at healthy levels   . Smoking  Cessation   . Maintain a healthy weight  o BMI 20-25   . Eat a healthy diet  o Plenty of fresh fruit, vegetables, and whole grains  o Limit saturated fats, foods high in sodium, and added sugars  o DASH and Mediterranean diet   . Increase physical activity  o Recommend moderate physically activity for 150 minutes per week/ 30 minutes a day for five days a week These can be broken up into three separate ten-minute sessions during the day.   . Reduce Stress  . Meditation, slow breathing exercises, yoga, coloring books  . Dental visits twice a year   COVID-19 vaccine recommendations:   COVID-19 vaccine is recommended for everyone (unless you are allergic to a vaccine component), even if you are on a medication that suppresses your immune system.   If you are on Methotrexate, Cellcept (mycophenolate), Rinvoq, Morrie Sheldon, and Olumiant- hold the medication for 1 week after each vaccine. Hold Methotrexate for 2 weeks after the single dose COVID-19 vaccine.   Recommendations are that the patients on immunosuppressive therapy should get the first 3 COVID-19 vaccines 1 month apart and then a fourth shot (booster) 3 months after the third shot.  Do not take Tylenol or any anti-inflammatory medications (NSAIDs) 24 hours prior to the COVID-19 vaccination.   There is no direct evidence about the efficacy of the COVID-19 vaccine in individuals who are on medications that suppress the immune system.   Even if you are fully vaccinated, and you are  on any medications that suppress your immune system, please continue to wear a mask, maintain at least six feet social distance and practice hand hygiene.   If you develop a COVID-19 infection, please contact your PCP or our office to determine if you need monoclonal antibody infusion.  The booster vaccine is now available for immunocompromised patients.   Please see the following web sites for updated information.    [email or web address]   Please discontinue methotrexate and Enbrel if you develop an infection.  Once the infection resolves then you may resume methotrexate and Enbrel.  While you are taking Enbrel you should have yearly skin examination by dermatologist to screen for known melanoma skin cancer.  Please use sunscreen SPF more than 50.  Smoking increases the risk of severe rheumatoid arthritis and also makes medications less effective.  We strongly advise that you seek help for smoking cessation.

## 2020-07-04 ENCOUNTER — Other Ambulatory Visit (HOSPITAL_COMMUNITY): Payer: Self-pay

## 2020-07-04 LAB — COMPLETE METABOLIC PANEL WITH GFR
AG Ratio: 1.2 (calc) (ref 1.0–2.5)
ALT: 16 U/L (ref 6–29)
AST: 12 U/L (ref 10–30)
Albumin: 4.2 g/dL (ref 3.6–5.1)
Alkaline phosphatase (APISO): 44 U/L (ref 31–125)
BUN: 12 mg/dL (ref 7–25)
CO2: 28 mmol/L (ref 20–32)
Calcium: 9.8 mg/dL (ref 8.6–10.2)
Chloride: 105 mmol/L (ref 98–110)
Creat: 0.92 mg/dL (ref 0.50–1.10)
GFR, Est African American: 90 mL/min/{1.73_m2} (ref >=60)
GFR, Est Non African American: 78 mL/min/{1.73_m2} (ref >=60)
Globulin: 3.4 g/dL (calc) (ref 1.9–3.7)
Glucose, Bld: 78 mg/dL (ref 65–99)
Potassium: 4.4 mmol/L (ref 3.5–5.3)
Sodium: 138 mmol/L (ref 135–146)
Total Bilirubin: 0.5 mg/dL (ref 0.2–1.2)
Total Protein: 7.6 g/dL (ref 6.1–8.1)

## 2020-07-04 LAB — CBC WITH DIFFERENTIAL/PLATELET
Absolute Monocytes: 592 cells/uL (ref 200–950)
Basophils Absolute: 39 cells/uL (ref 0–200)
Basophils Relative: 0.4 %
Eosinophils Absolute: 524 cells/uL — ABNORMAL HIGH (ref 15–500)
Eosinophils Relative: 5.4 %
HCT: 42.5 % (ref 35.0–45.0)
Hemoglobin: 14.4 g/dL (ref 11.7–15.5)
Lymphs Abs: 4074 cells/uL — ABNORMAL HIGH (ref 850–3900)
MCH: 30.8 pg (ref 27.0–33.0)
MCHC: 33.9 g/dL (ref 32.0–36.0)
MCV: 90.8 fL (ref 80.0–100.0)
MPV: 9.9 fL (ref 7.5–12.5)
Monocytes Relative: 6.1 %
Neutro Abs: 4472 cells/uL (ref 1500–7800)
Neutrophils Relative %: 46.1 %
Platelets: 335 10*3/uL (ref 140–400)
RBC: 4.68 10*6/uL (ref 3.80–5.10)
RDW: 14.3 % (ref 11.0–15.0)
Total Lymphocyte: 42 %
WBC: 9.7 10*3/uL (ref 3.8–10.8)

## 2020-07-05 ENCOUNTER — Other Ambulatory Visit (HOSPITAL_COMMUNITY): Payer: Self-pay

## 2020-07-16 ENCOUNTER — Other Ambulatory Visit: Payer: Self-pay | Admitting: Rheumatology

## 2020-07-16 NOTE — Telephone Encounter (Signed)
Next Visit: 10/03/2020  Last Visit: 07/03/2020  Last Fill: 05/22/2020  Dx: Rheumatoid arthritis involving multiple sites with positive rheumatoid factor   Current Dose per office note on 0/93/2355, folic acid 2 mg po daily  Okay to refill folic acid?

## 2020-07-19 ENCOUNTER — Other Ambulatory Visit: Payer: Self-pay | Admitting: Physician Assistant

## 2020-07-19 NOTE — Telephone Encounter (Signed)
Next Visit: 10/03/2020  Last Visit: 07/03/2020  Last Fill: 05/22/2020  DX: Rheumatoid arthritis involving multiple sites with positive rheumatoid factor   Current Dose per office note 07/03/2020, MTX 0.8 ml sq once weekly  Labs: 07/03/2020, CMP WNL. Absolute lymphocytes and eosinophils are slightly elevated. Rest of CBC WNL. We will continue to monitor.   Okay to refill MTX?

## 2020-07-31 ENCOUNTER — Other Ambulatory Visit (HOSPITAL_COMMUNITY): Payer: Self-pay

## 2020-08-06 ENCOUNTER — Other Ambulatory Visit (HOSPITAL_COMMUNITY): Payer: Self-pay

## 2020-08-08 ENCOUNTER — Other Ambulatory Visit (HOSPITAL_COMMUNITY): Payer: Self-pay

## 2020-08-08 ENCOUNTER — Telehealth: Payer: Self-pay

## 2020-08-08 NOTE — Telephone Encounter (Signed)
Patient called stating after she gives herself the Enbrel injection she usually has a knot at the injection site that lasts for approximately 2 days.  Patient states the last time she took her Enbrel she noticed the imprint of the pen and developed a small rash that resembled a "rasberry."  Patient states she is also experiencing redness at the injection site and a knot when she takes her Methotrexate. Patient is concerned as she is having knots at her injection site. Patient would like to know what she should do. Please advise.

## 2020-08-08 NOTE — Telephone Encounter (Signed)
Patient called stating after she gives herself the Enbrel injection she usually has a knot at the injection site that lasts for approximately 2 days.  Patient states the last time she took her Enbrel she noticed the imprint of the pen and developed a small rash that resembled a "rasberry."  Patient states she is also experiencing redness at the injection site when she takes her Methotrexate.  Patient states she wanted to discuss these reactions with the nurse before she orders her refills.

## 2020-08-08 NOTE — Telephone Encounter (Signed)
I returned patient's call and advised her to ice the area.  She was also advised to rotate the injection sites.

## 2020-08-10 ENCOUNTER — Other Ambulatory Visit (HOSPITAL_COMMUNITY): Payer: Self-pay

## 2020-08-13 ENCOUNTER — Other Ambulatory Visit (HOSPITAL_COMMUNITY): Payer: Self-pay

## 2020-08-26 ENCOUNTER — Other Ambulatory Visit: Payer: Self-pay

## 2020-08-26 ENCOUNTER — Emergency Department (HOSPITAL_COMMUNITY)
Admission: EM | Admit: 2020-08-26 | Discharge: 2020-08-26 | Disposition: A | Discharge status: Home / Self Care | Payer: Medicare Other | Attending: Emergency Medicine | Admitting: Emergency Medicine

## 2020-08-26 ENCOUNTER — Encounter (HOSPITAL_COMMUNITY): Payer: Self-pay | Admitting: Emergency Medicine

## 2020-08-26 ENCOUNTER — Emergency Department (HOSPITAL_COMMUNITY): Payer: Medicare Other

## 2020-08-26 DIAGNOSIS — J45901 Unspecified asthma with (acute) exacerbation: Secondary | ICD-10-CM | POA: Insufficient documentation

## 2020-08-26 DIAGNOSIS — R202 Paresthesia of skin: Secondary | ICD-10-CM | POA: Diagnosis present

## 2020-08-26 DIAGNOSIS — R519 Headache, unspecified: Secondary | ICD-10-CM | POA: Diagnosis not present

## 2020-08-26 DIAGNOSIS — I10 Essential (primary) hypertension: Secondary | ICD-10-CM

## 2020-08-26 DIAGNOSIS — F17210 Nicotine dependence, cigarettes, uncomplicated: Secondary | ICD-10-CM | POA: Insufficient documentation

## 2020-08-26 LAB — URINALYSIS, ROUTINE W REFLEX MICROSCOPIC
Bacteria, UA: NONE SEEN
Bilirubin Urine: NEGATIVE
Glucose, UA: NEGATIVE mg/dL
Ketones, ur: NEGATIVE mg/dL
Leukocytes,Ua: NEGATIVE
Nitrite: NEGATIVE
Protein, ur: NEGATIVE mg/dL
Specific Gravity, Urine: 1.018 (ref 1.005–1.030)
pH: 5 (ref 5.0–8.0)

## 2020-08-26 LAB — CBC WITH DIFFERENTIAL/PLATELET
Abs Immature Granulocytes: 0.01 10*3/uL (ref 0.00–0.07)
Basophils Absolute: 0.1 10*3/uL (ref 0.0–0.1)
Basophils Relative: 1 %
Eosinophils Absolute: 0.7 10*3/uL — ABNORMAL HIGH (ref 0.0–0.5)
Eosinophils Relative: 11 %
HCT: 44.4 % (ref 36.0–46.0)
Hemoglobin: 15.5 g/dL — ABNORMAL HIGH (ref 12.0–15.0)
Immature Granulocytes: 0 %
Lymphocytes Relative: 54 %
Lymphs Abs: 3.7 10*3/uL (ref 0.7–4.0)
MCH: 31.3 pg (ref 26.0–34.0)
MCHC: 34.9 g/dL (ref 30.0–36.0)
MCV: 89.5 fL (ref 80.0–100.0)
Monocytes Absolute: 0.5 10*3/uL (ref 0.1–1.0)
Monocytes Relative: 7 %
Neutro Abs: 1.9 10*3/uL (ref 1.7–7.7)
Neutrophils Relative %: 27 %
Platelets: 340 10*3/uL (ref 150–400)
RBC: 4.96 MIL/uL (ref 3.87–5.11)
RDW: 13.4 % (ref 11.5–15.5)
WBC: 6.8 10*3/uL (ref 4.0–10.5)
nRBC: 0 % (ref 0.0–0.2)

## 2020-08-26 LAB — COMPREHENSIVE METABOLIC PANEL
ALT: 45 U/L — ABNORMAL HIGH (ref 0–44)
AST: 28 U/L (ref 15–41)
Albumin: 3.7 g/dL (ref 3.5–5.0)
Alkaline Phosphatase: 46 U/L (ref 38–126)
Anion gap: 7 (ref 5–15)
BUN: 10 mg/dL (ref 6–20)
CO2: 22 mmol/L (ref 22–32)
Calcium: 9.6 mg/dL (ref 8.9–10.3)
Chloride: 108 mmol/L (ref 98–111)
Creatinine, Ser: 0.85 mg/dL (ref 0.44–1.00)
GFR, Estimated: >60 mL/min (ref >60)
Glucose, Bld: 85 mg/dL (ref 70–99)
Potassium: 4 mmol/L (ref 3.5–5.1)
Sodium: 137 mmol/L (ref 135–145)
Total Bilirubin: 0.7 mg/dL (ref 0.3–1.2)
Total Protein: 7.6 g/dL (ref 6.5–8.1)

## 2020-08-26 LAB — PREGNANCY, URINE: Preg Test, Ur: NEGATIVE

## 2020-08-26 IMAGING — MR MR CERVICAL SPINE W/O CM
4 of 5 series · 19 of 48 positions shown · non-contrast
Comparison: [DATE]

CLINICAL DATA: Right hand and arm numbness and tingling

EXAM:
MRI CERVICAL SPINE WITHOUT CONTRAST
TECHNIQUE: Multiplanar, multisequence MR imaging of the cervical spine was
performed. No intravenous contrast was administered.

[Series 11: T2 · sagittal · 3.0mm · 0.43mm/px · 5 of 17 slices shown (1 of 2)]
[im 1/17]
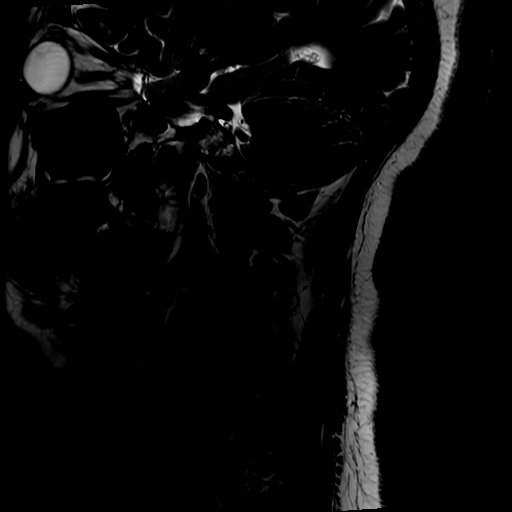
[im 5/17]
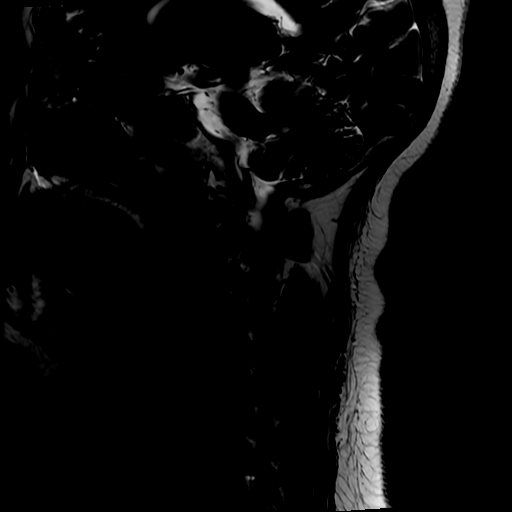
[im 9/17]
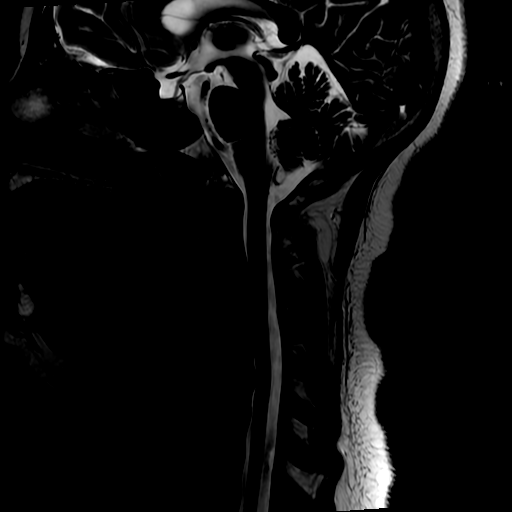
[im 13/17]
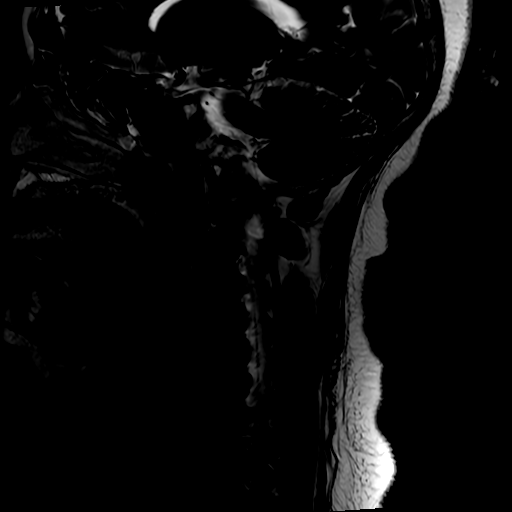
[im 17/17]
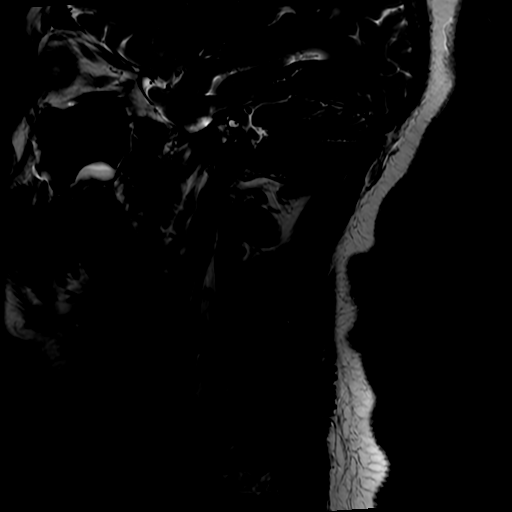

[Series 12: FLAIR · sagittal · 3.0mm · 0.43mm/px · 3 of 17 slices shown]
[im 1/17]
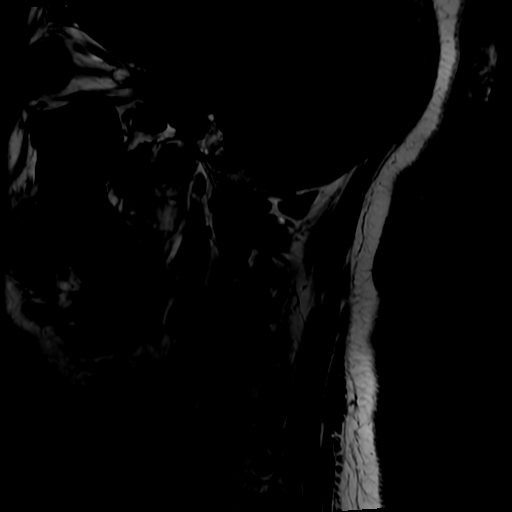
[im 11/17]
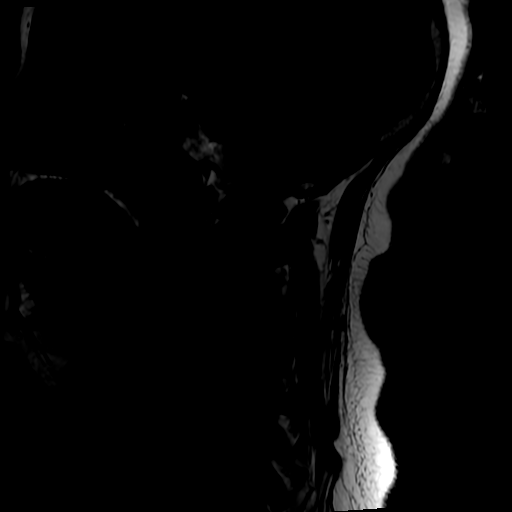
[im 17/17]
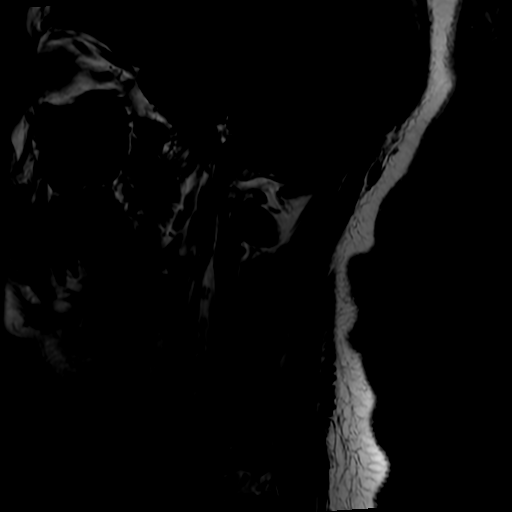

[Series 13: STIR · sagittal · 3.0mm · 0.43mm/px · 3 of 17 slices shown]
[im 1/17]
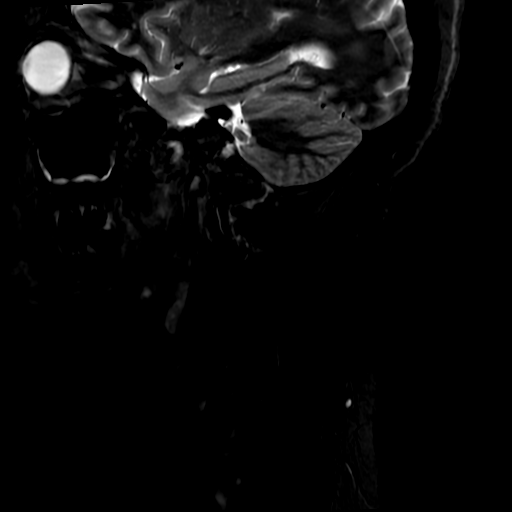
[im 11/17]
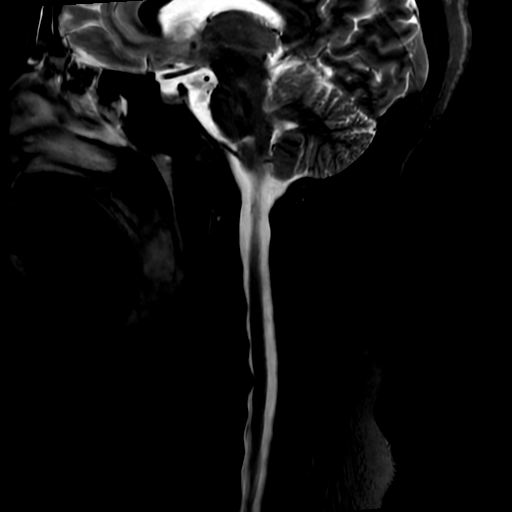
[im 17/17]
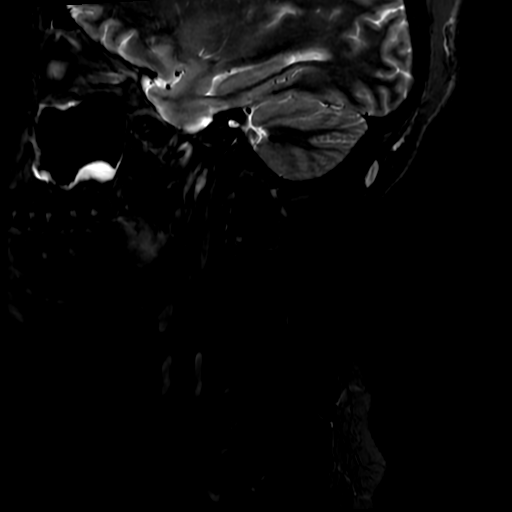

[Series 15: T2 · axial · 3.0mm · 0.35mm/px · z∈[-200,-108]mm · 8 of 35 slices shown (2 of 2)]
[im 1/35]
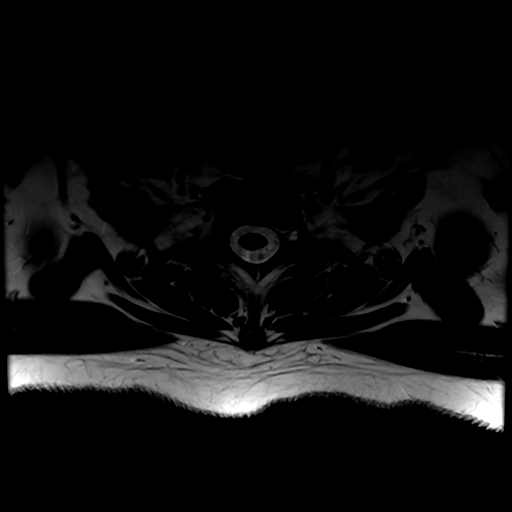
[im 5/35]
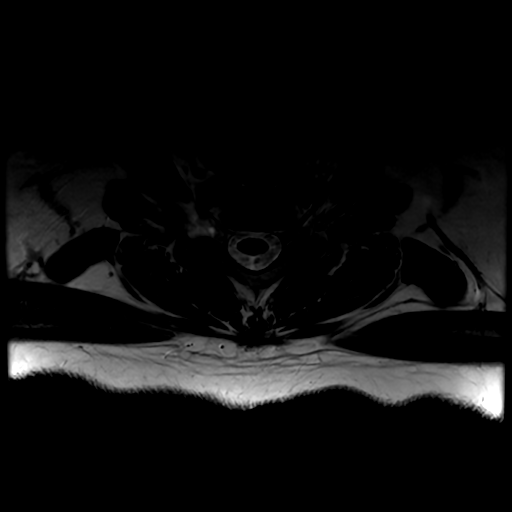
[im 9/35]
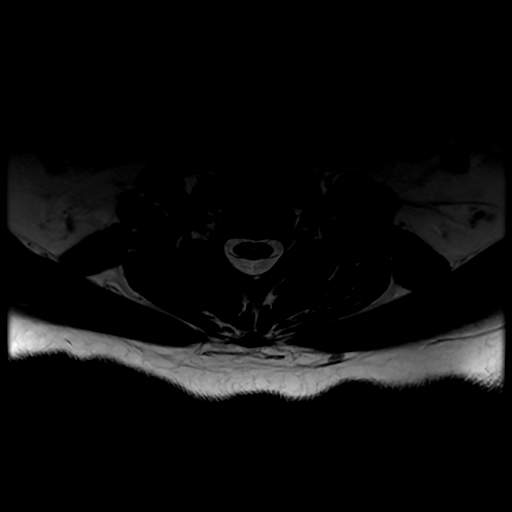
[im 13/35]
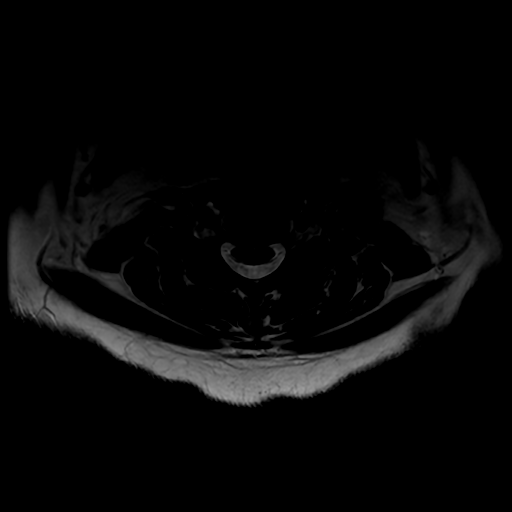
[im 18/35]
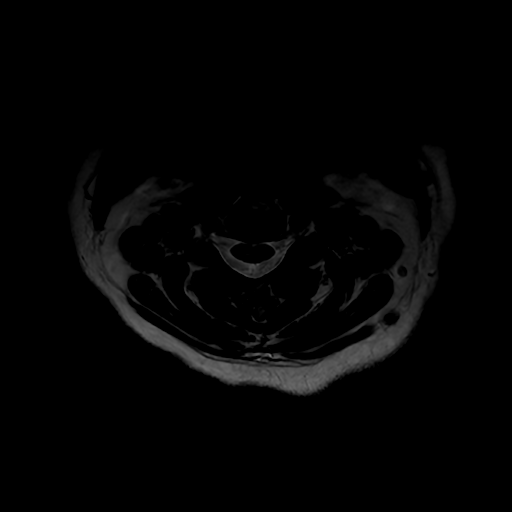
[im 22/35]
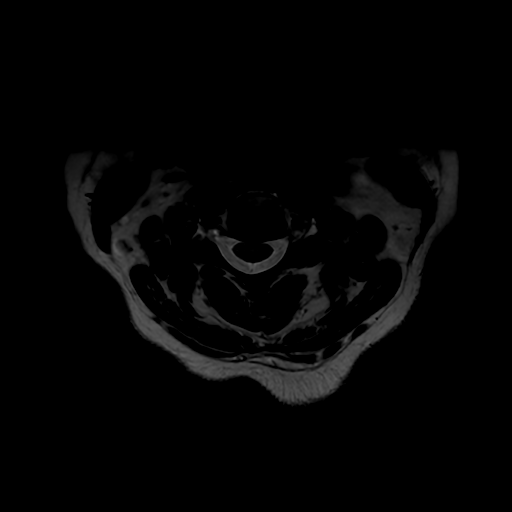
[im 26/35]
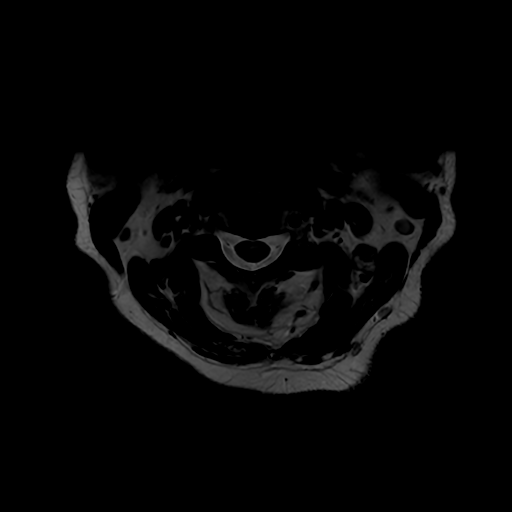
[im 30/35]
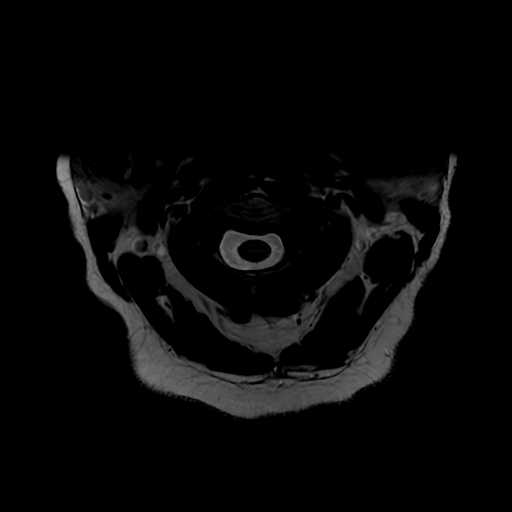

[19 of 48 positions shown; findings below may reference images not displayed]

FINDINGS: Alignment: No significant listhesis.

Vertebrae: Vertebral body heights are maintained. No marrow edema.
No suspicious osseous lesion.

Cord: No abnormal signal.

Posterior Fossa, vertebral arteries, paraspinal tissues:
Unremarkable.

Disc levels:

C2-C3:  No canal or foraminal stenosis.

C3-C4:  No canal or foraminal stenosis

C4-C5:  No canal or foraminal stenosis.

C5-C6: Mild disc bulge indenting the thecal sac. No canal or
foraminal stenosis.

C6-C7:  No canal or foraminal stenosis.

C7-T1:  No canal or foraminal stenosis.
IMPRESSION: Minor degenerative changes without stenosis. No new finding since
[DATE].

## 2020-08-26 IMAGING — MR MR HEAD W/O CM
6 of 10 series · 29 of 48 positions shown · non-contrast
Comparison: None.

CLINICAL DATA: Right hand and arm numbness and tingling

EXAM:
MRI HEAD WITHOUT CONTRAST
TECHNIQUE: Multiplanar, multiecho pulse sequences of the brain and surrounding
structures were obtained without intravenous contrast.

[Series 2: DWI · axial · 3.0mm · 0.94mm/px · z∈[-94,+45]mm · 9 of 98 slices shown (1 of 2)]
[im 1/98]
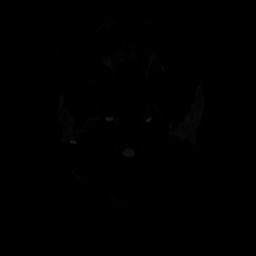
[im 13/98]
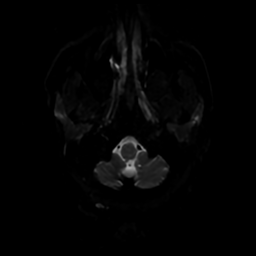
[im 25/98]
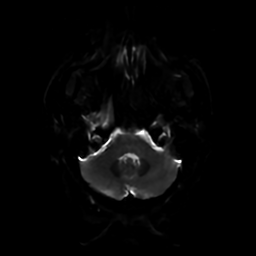
[im 37/98]
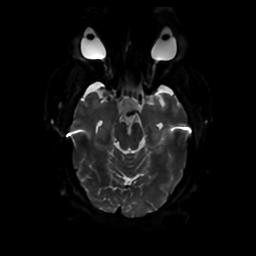
[im 49/98]
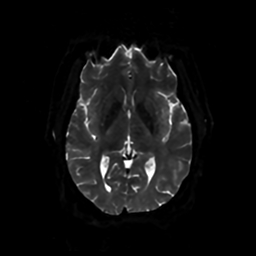
[im 61/98]
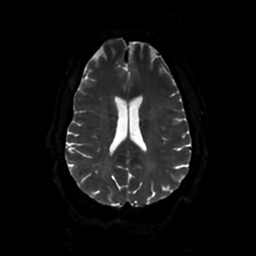
[im 73/98]
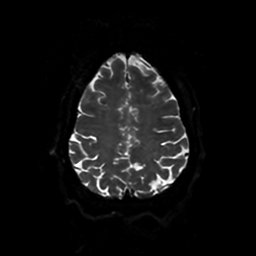
[im 85/98]
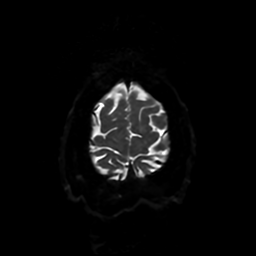
[im 98/98]
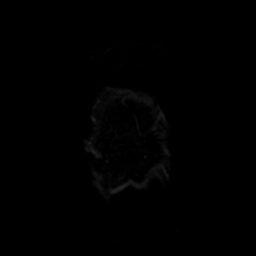

[Series 3: DWI · coronal · 4.0mm · 0.94mm/px · 7 of 72 slices shown (2 of 2)]
[im 1/72]
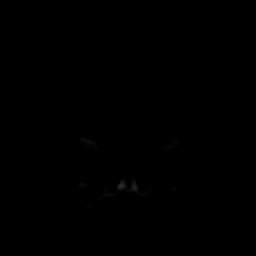
[im 12/72]
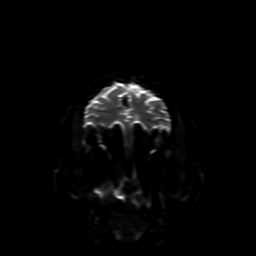
[im 24/72]
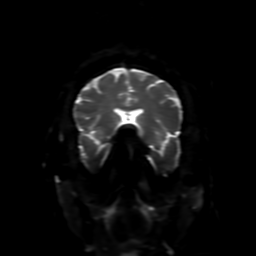
[im 36/72]
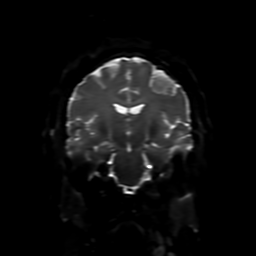
[im 48/72]
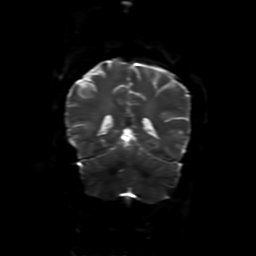
[im 60/72]
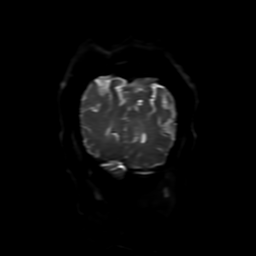
[im 72/72]
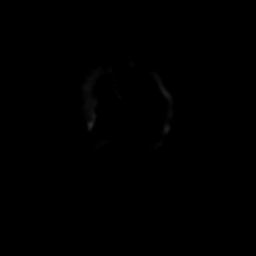

[Series 4: FLAIR · sagittal · 5.0mm · 0.23mm/px · 2 of 23 slices shown (1 of 2)]
[im 1/23]
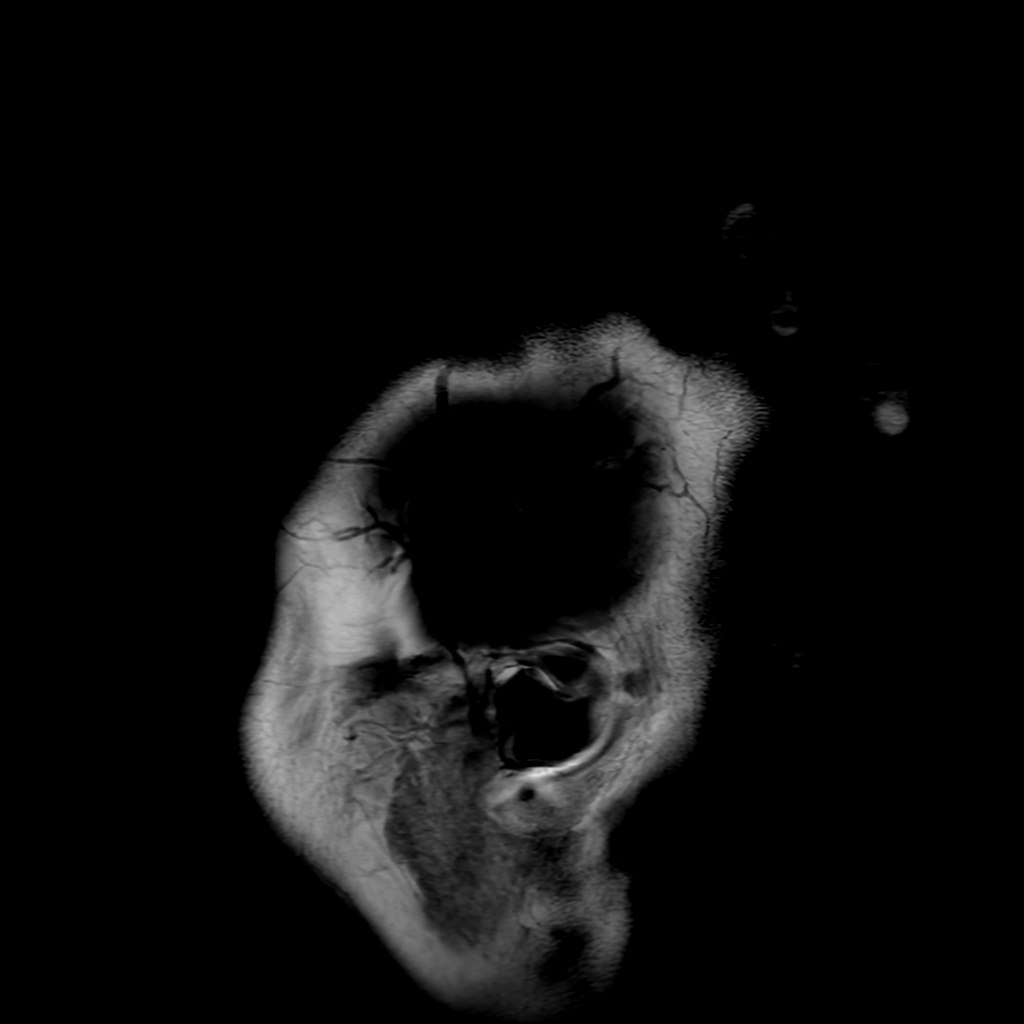
[im 23/23]
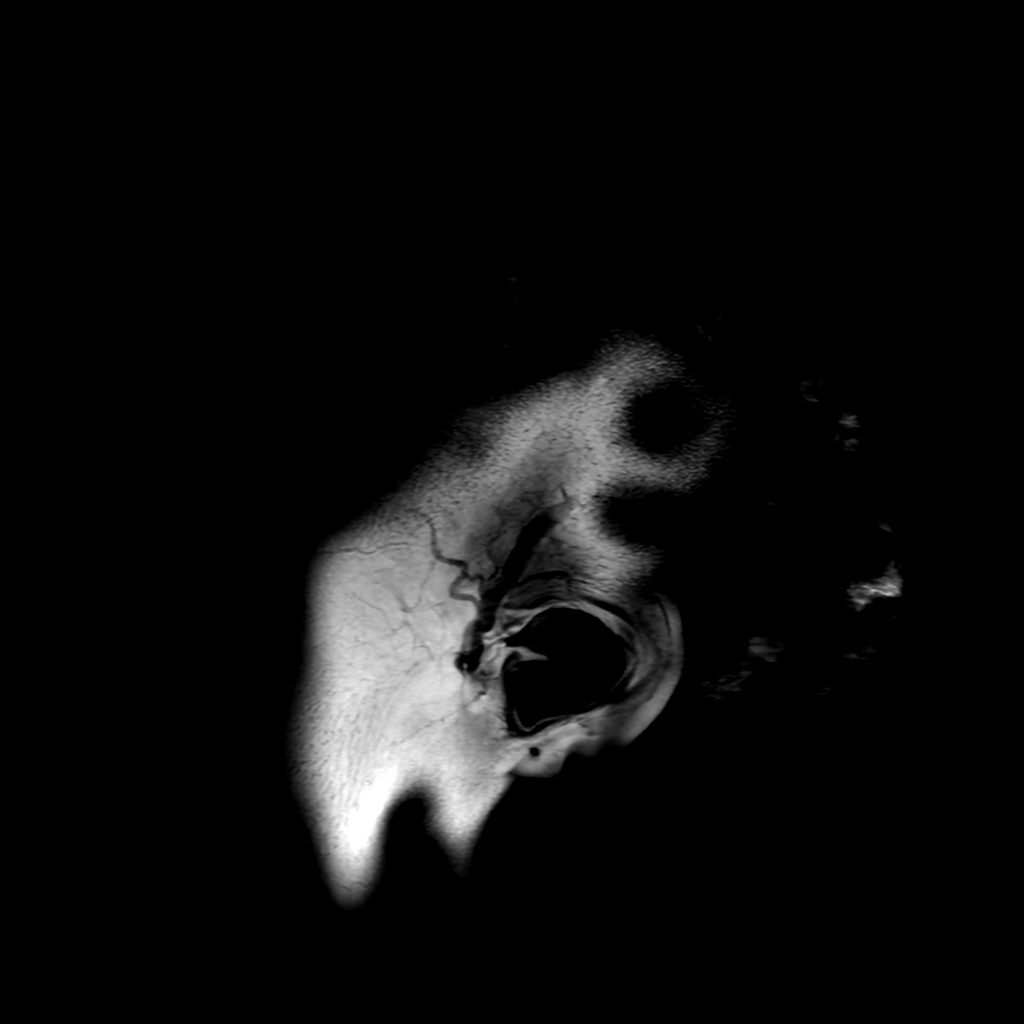

[Series 6: FLAIR · axial · 4.0mm · 0.45mm/px · z∈[-92,+46]mm · 3 of 34 slices shown (2 of 2)]
[im 1/34]
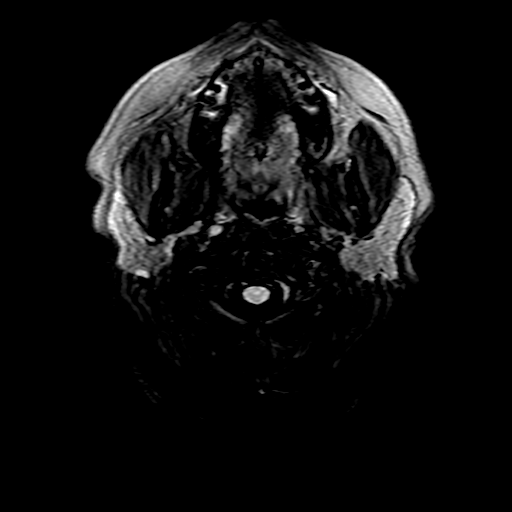
[im 17/34]
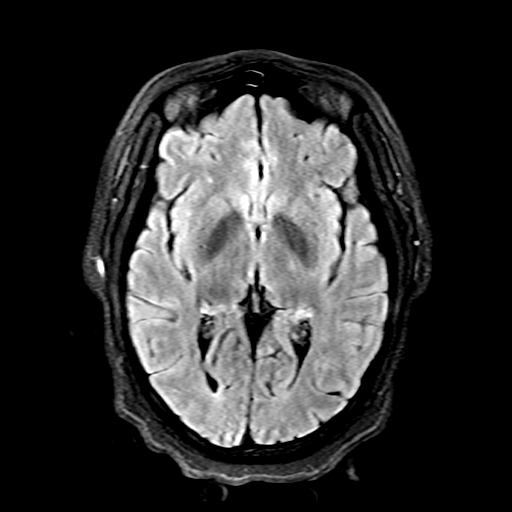
[im 34/34]
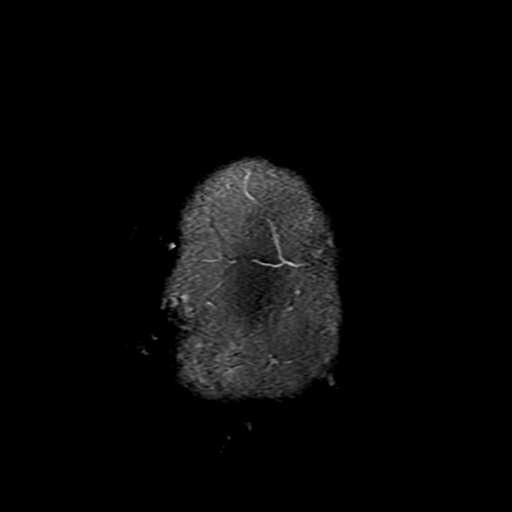

[Series 250: ADC · axial · 3.0mm · 0.94mm/px · z∈[-94,+45]mm · 5 of 50 slices shown (1 of 2)]
[im 1/50]
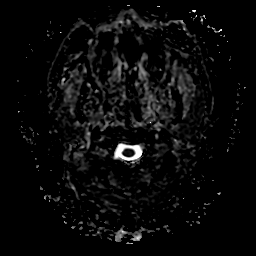
[im 13/50]
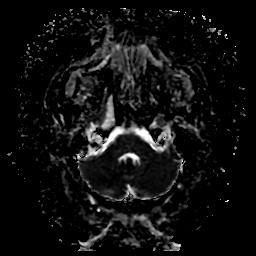
[im 25/50]
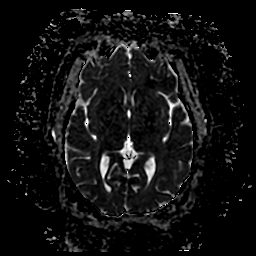
[im 37/50]
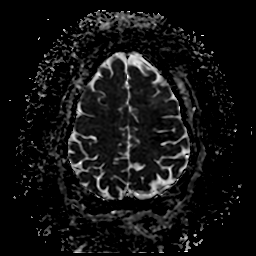
[im 50/50]
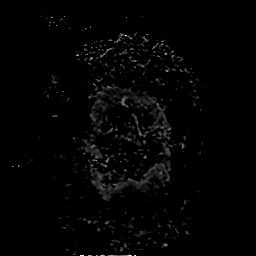

[Series 350: ADC · coronal · 4.0mm · 0.94mm/px · 3 of 36 slices shown (2 of 2)]
[im 1/36]
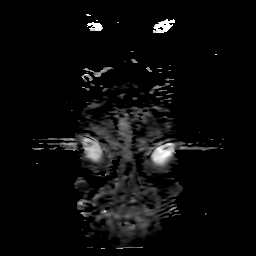
[im 18/36]
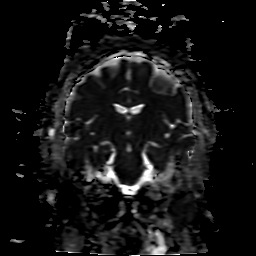
[im 36/36]
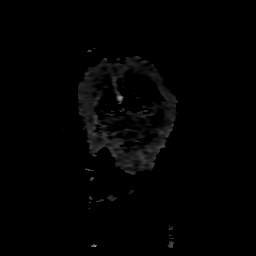

[29 of 48 positions shown; findings below may reference images not displayed]

FINDINGS: Brain: There is no acute infarction or intracranial hemorrhage.
There is no intracranial mass, mass effect, or edema. There is no
hydrocephalus or extra-axial fluid collection. Ventricles and sulci
are normal in size and configuration. Few patchy foci of T2
hyperintensity in the supratentorial white matter are nonspecific.

Vascular: Major vessel flow voids at the skull base are preserved.

Skull and upper cervical spine: Normal marrow signal is preserved.

Sinuses/Orbits: Paranasal sinuses are aerated. Orbits are
unremarkable.

Other: Sella is unremarkable.  Mastoid air cells are clear.
IMPRESSION: No evidence of recent infarction, hemorrhage, or mass.

Few foci of nonspecific gliosis/demyelination in the cerebral white
matter.

## 2020-08-26 MED ORDER — LORAZEPAM 2 MG/ML IJ SOLN
1.0000 mg | Freq: Once | INTRAMUSCULAR | Status: AC | PRN
  Administered 2020-08-26: 1 mg via INTRAVENOUS
  Filled 2020-08-26: qty 1

## 2020-08-26 NOTE — ED Provider Notes (Signed)
Emergency Medicine Provider Triage Evaluation Note  Elizabeth Pope , a 40 y.o. female  was evaluated in triage.  Pt complains of headache, hypertension, right hand numbness and tingling in right arm.  She reports that she takes amlodipine and losartan however has not taken this medication over the last few days.  Patient reports that she has had headaches all throughout this week.  Headache is gradual onset and gets progressively worse over time.  Pain is located to frontal aspect of head.  Patient currently rates headache 3/10 on the pain scale.  No associated facial symmetry, slurred speech, disturbance with headaches.  Patient does endorse intermittent numbness to right hand and tingling in right arm.  Per chart review patient has history of migraines as well as paresthesias to right extremity.  Review of Systems  Positive: Headache, numbness to left upper extremity Negative: Chest pain, shortness of breath, slurred speech, facial asymmetry  Physical Exam  BP (!) 143/107 (BP Location: Left Arm)   Pulse 85   Temp 98.3 F (36.8 C) (Oral)   Resp 18   SpO2 100%  Gen:   Awake, no distress   Resp:  Normal effort  MSK:   Moves extremities without difficulty  Other:  CN II-XII intact, no pronator drift, +5 strength to bilateral upper and lower extremities.  Medical Decision Making  Medically screening exam initiated at 12:13 PM.  Appropriate orders placed.  Ellison Hughs Camera Krienke was informed that the remainder of the evaluation will be completed by another provider, this initial triage assessment does not replace that evaluation, and the importance of remaining in the ED until their evaluation is complete.  Patient has history of migraines as well as paresthesias to right upper extremity.  Will defer CT imaging at this time.  The patient appears stable so that the remainder of the work up may be completed by another provider.      Dyann Ruddle 08/26/20 1217     Arnaldo Natal, MD 08/26/20 [phone]

## 2020-08-26 NOTE — ED Notes (Signed)
Patient discharge instructions reviewed with the patient. The patient verbalized understanding of instruction.  Patient discharged.

## 2020-08-26 NOTE — Discharge Instructions (Addendum)
You were seen in the emergency department today with headaches, numbness/tingling in the hands, elevated blood pressures.  Your blood pressures here have normalized without additional medications.  I would like for you to call your primary care doctor on Tuesday to discuss your blood pressure medicines and ED visit.  I have placed a referral in our system for our neurology team to call you and schedule an outpatient appointment.  They can address the numbness/tingling in your hands.  Please continue your home medicines and return to the emergency department any new or suddenly worsening symptoms.

## 2020-08-26 NOTE — ED Provider Notes (Signed)
Emergency Department Provider Note   I have reviewed the triage vital signs and the nursing notes.   HISTORY  Chief Complaint Numbness and Hypertension   HPI [place] Hanlon is a 40 y.o. female with past medical history reviewed below presents to the emergency department with intermittent headache, tingling/numbness in the bilateral upper extremities as well as face, elevated blood pressures.  Patient has been feeling unwell for the past week.  She reports a mild, frontal headache.  No sudden/severe headache symptoms.  No jaw claudication.  She is not having vision change but notes that she feels intermittently numb in the right hand and occasionally in the left.  She has been feeling some tingling/numb feeling in the face as well.  No specific leg symptoms.  No neck pain.  She is not having fevers or chills.  She is compliant with her amlodipine and losartan and checks her blood pressures at home and notices that over the past several days she has had systolic pressures as high as 250N and diastolics as high as 397 which is unusual for her. Denies any CP.    Past Medical History:  Diagnosis Date   Anxiety    Bipolar 1 disorder ([place])    DDD (degenerative disc disease), cervical 12/25/2015   Depression    GERD (gastroesophageal reflux disease) 12/25/2015   Hypertension 12/25/2015   Migraine    Migraines 12/25/2015   OCD (obsessive compulsive disorder)    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/2015   mild   Osteoarthritis of both hands 12/25/2015   PTSD (post-traumatic stress disorder)    RA (rheumatoid arthritis) ([place]) 12/25/2015   Positive RF, Elevated ESR, Positive CCP > 250     Patient Active Problem List   Diagnosis Date Noted   Asthma with acute exacerbation 09/12/2018   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   Pelvic pain 06/02/2017   Atypical squamous cell changes of undetermined significance (ASCUS) on cervical cytology with negative high risk human  papilloma virus (HPV) test result 02/03/2017   Encounter for IUD insertion 01/28/2017   Uterine fibroid 01/21/2017   H/O tubal ligation 06/24/2016   High risk medication use 01/07/2016   GERD (gastroesophageal reflux disease) 12/25/2015   Migraines 12/25/2015   Hypertension 12/25/2015   RA (rheumatoid arthritis) ([place]) 12/25/2015   DDD (degenerative disc disease), cervical 12/25/2015   Osteoarthritis of both hands 12/25/2015   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/2015   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   TB lung, latent 05/14/2015   Bipolar 2 disorder ([place]) 05/14/2015   Cigarette smoker 05/05/2015   Chest pain 05/05/2015   Solitary pulmonary nodule 05/04/2015    Past Surgical History:  Procedure Laterality Date   CESAREAN SECTION     TUBAL LIGATION      Allergies Fluoxetine, Hydrocodone, Adalimumab, Hydrochlorothiazide, Hydrocodone-acetaminophen, Hydroxychloroquine, Other, and Vicodin [hydrocodone-acetaminophen]  Family History  Problem Relation Age of Onset   Rheum arthritis Mother    Migraines Mother    Hypertension Mother    Colitis Mother    Rheum arthritis Maternal Grandmother    Rheum arthritis Maternal Aunt    Migraines Sister    Migraines Brother    Sickle cell anemia Daughter    Bipolar disorder Daughter    Anxiety disorder Daughter    Depression Daughter    Migraines Daughter    Arrhythmia Daughter    Migraines Son    ADD / ADHD Son    Seizures Son    Arrhythmia Son  Social History Social History   Tobacco Use   Smoking status: Some Days    Packs/day: 0.50    Years: 19.00    Pack years: 9.50    Types: Cigars, Cigarettes   Smokeless tobacco: Never   Tobacco comments:    former cigarette smoker  Vaping Use   Vaping Use: Former  Substance Use Topics   Alcohol use: Yes    Alcohol/week: 0.0 standard drinks    Comment: occasional   Drug use: No    Review of Systems  Constitutional: No fever/chills Eyes: No visual changes. ENT:  No sore throat. Cardiovascular: Denies chest pain. Respiratory: Denies shortness of breath. Gastrointestinal: No abdominal pain.  No nausea, no vomiting.  No diarrhea.  No constipation. Genitourinary: Negative for dysuria. Musculoskeletal: Negative for back pain. Skin: Negative for rash. Neurological: Positive HA and numbness in the face/upper extremities.   10-point ROS otherwise negative.  ____________________________________________   PHYSICAL EXAM:  VITAL SIGNS: ED Triage Vitals  Enc Vitals Group     BP 08/26/20 1204 (!) 143/107     Pulse Rate 08/26/20 1204 85     Resp 08/26/20 1204 18     Temp 08/26/20 1204 98.3 F (36.8 C)     Temp Source 08/26/20 1204 Oral     SpO2 08/26/20 1204 100 %   Constitutional: Alert and oriented. Well appearing and in no acute distress. Eyes: Conjunctivae are normal. PERRL.  Head: Atraumatic. No temporal tenderness.  Nose: No congestion/rhinnorhea. Mouth/Throat: Mucous membranes are moist.   Neck: No stridor.  Cardiovascular: Normal rate, regular rhythm. Good peripheral circulation. Grossly normal heart sounds.   Respiratory: Normal respiratory effort.  No retractions. Lungs CTAB. Gastrointestinal: Soft and nontender. No distention.  Musculoskeletal: No lower extremity tenderness nor edema. No gross deformities of extremities. Neurologic:  Normal speech and language.  Subjective sensation difference to the bilateral face.  She has 5/5 grip strength.  No facial asymmetry.  No dysarthria. Decreased sensation to the right hand subjectively.  Skin:  Skin is warm, dry and intact. No rash noted.   ____________________________________________   LABS (all labs ordered are listed, but only abnormal results are displayed)  Labs Reviewed  COMPREHENSIVE METABOLIC PANEL - Abnormal; Notable for the following components:      Result Value   ALT 45 (*)    All other components within normal limits  CBC WITH DIFFERENTIAL/PLATELET - Abnormal; Notable  for the following components:   Hemoglobin 15.5 (*)    Eosinophils Absolute 0.7 (*)    All other components within normal limits  URINALYSIS, ROUTINE W REFLEX MICROSCOPIC - Abnormal; Notable for the following components:   APPearance HAZY (*)    Hgb urine dipstick MODERATE (*)    All other components within normal limits  PREGNANCY, URINE   ____________________________________________  EKG   EKG Interpretation  Date/Time:  Sunday August 26 2020 15:44:55 EDT Ventricular Rate:  73 PR Interval:  179 QRS Duration: 78 QT Interval:  363 QTC Calculation: 400 R Axis:   12 Text Interpretation: Sinus rhythm LAE, consider biatrial enlargement Borderline T abnormalities, lateral leads Confirmed by Nanda Quinton [phone]) on 08/26/2020 7:10:53 PM         ____________________________________________  RADIOLOGY  MR BRAIN WO CONTRAST  Result Date: 08/26/2020 CLINICAL DATA:  Right hand and arm numbness and tingling EXAM: MRI HEAD WITHOUT CONTRAST TECHNIQUE: Multiplanar, multiecho pulse sequences of the brain and surrounding structures were obtained without intravenous contrast. COMPARISON:  None. FINDINGS: Brain: There is no acute  infarction or intracranial hemorrhage. There is no intracranial mass, mass effect, or edema. There is no hydrocephalus or extra-axial fluid collection. Ventricles and sulci are normal in size and configuration. Few patchy foci of T2 hyperintensity in the supratentorial white matter are nonspecific. Vascular: Major vessel flow voids at the skull base are preserved. Skull and upper cervical spine: Normal marrow signal is preserved. Sinuses/Orbits: Paranasal sinuses are aerated. Orbits are unremarkable. Other: Sella is unremarkable.  Mastoid air cells are clear. IMPRESSION: No evidence of recent infarction, hemorrhage, or mass. Few foci of nonspecific gliosis/demyelination in the cerebral white matter. Electronically Signed   By: Macy Mis M.D.   On: 08/26/2020 17:39   MR  Cervical Spine Wo Contrast  Result Date: 08/26/2020 CLINICAL DATA:  Right hand and arm numbness and tingling EXAM: MRI CERVICAL SPINE WITHOUT CONTRAST TECHNIQUE: Multiplanar, multisequence MR imaging of the cervical spine was performed. No intravenous contrast was administered. COMPARISON:  07/04/2020 FINDINGS: Alignment: No significant listhesis. Vertebrae: Vertebral body heights are maintained. No marrow edema. No suspicious osseous lesion. Cord: No abnormal signal. Posterior Fossa, vertebral arteries, paraspinal tissues: Unremarkable. Disc levels: C2-C3:  No canal or foraminal stenosis. C3-C4:  No canal or foraminal stenosis C4-C5:  No canal or foraminal stenosis. C5-C6: Mild disc bulge indenting the thecal sac. No canal or foraminal stenosis. C6-C7:  No canal or foraminal stenosis. C7-T1:  No canal or foraminal stenosis. IMPRESSION: Minor degenerative changes without stenosis. No new finding since 07/04/2020. Electronically Signed   By: Macy Mis M.D.   On: 08/26/2020 18:42    ____________________________________________   PROCEDURES  Procedure(s) performed:   Procedures  None  ____________________________________________   INITIAL IMPRESSION / ASSESSMENT AND PLAN / ED COURSE  Pertinent labs & imaging results that were available during my care of the patient were reviewed by me and considered in my medical decision making (see chart for details).   Patient presents to the emergency department with fairly vague neuro symptoms/paresthesias mainly.  Feeling off with elevated blood pressures as well.  No sudden onset, maximal intensity headache symptoms to suspect hemorrhage.  Question complicated migraine versus TIA versus hypertensive emergency.  She does have history of paresthesias in the upper extremities thought to be from cervical radiculopathy but I do not see any MRI imaging in the brief review of medical records plan for MRI brain and cervical spine today with labs and UA.    07:06 PM  Patient's MRI of the brain and cervical spine shows some chronic changes but nothing acute.  Patient's blood pressure has normalized without acute intervention in the emergency department.  She continues to feel somewhat off but is not having focal neurologic deficits.  Her lab work is similarly unremarkable.  I do not find evidence of acute hypertensive emergency.  I will refer her to neurology as an outpatient with the paresthesias.  These may be peripheral in nature but cervical MRI does not show acute stenosis or other neurosurgical emergency requiring consultation.  She will follow with her primary care doctor on Tuesday to discuss blood pressure management options.  With normalized blood pressures here do not see an indication to adjust her medicines from the emergency department. ____________________________________________  FINAL CLINICAL IMPRESSION(S) / ED DIAGNOSES  Final diagnoses:  Paresthesias  Primary hypertension  Acute nonintractable headache, unspecified headache type    MEDICATIONS GIVEN DURING THIS VISIT:  Medications  LORazepam (ATIVAN) injection 1 mg (1 mg Intravenous Given 08/26/20 1618)    Note:  This document was prepared  using Systems analyst and may include unintentional dictation errors.  Nanda Quinton, MD, [facility] Emergency Medicine    Evetta Renner, [facility], MD 08/26/20 [phone]

## 2020-08-26 NOTE — ED Triage Notes (Signed)
PT reports blood pressure elevated for 3 days.  Taking BP medication as prescribed.  Reports waking up with R hand numbness and tingling in R arm.  No arm drift.  Reports headache.

## 2020-08-28 ENCOUNTER — Other Ambulatory Visit (HOSPITAL_COMMUNITY): Payer: Self-pay

## 2020-09-03 ENCOUNTER — Other Ambulatory Visit (HOSPITAL_COMMUNITY): Payer: Self-pay

## 2020-09-05 ENCOUNTER — Other Ambulatory Visit (HOSPITAL_COMMUNITY): Payer: Self-pay

## 2020-09-07 ENCOUNTER — Other Ambulatory Visit (HOSPITAL_COMMUNITY): Payer: Self-pay

## 2020-09-11 ENCOUNTER — Other Ambulatory Visit (HOSPITAL_COMMUNITY): Payer: Self-pay

## 2020-09-20 NOTE — Progress Notes (Signed)
Office Visit Note  Patient: Elizabeth Pope             Date of Birth: 1980/04/19           MRN: [id number]             PCP: Kristie Cowman, MD Referring: Kristie Cowman, MD Visit Date: 10/03/2020 Occupation: @GUAROCC @  Subjective:  Medication monitoring   History of Present Illness: Elizabeth Pope is a 40 y.o. female with history of seropositive rheumatoid arthritis and osteoarthritis.  She is on enbrel 50 mg sq injections once weekly, methotrexate 0.8 ml sq injections once weekly, and folic acid 2 mg daily.  She has not missed any doses of these medications recently.  She states that last Friday she had a flare which lasted through Sunday.  She states that the prior weekend she was at a family reunion at which time she was more active than usual which she thinks caused the flare.  She states that she was having increased pain in both hands, both knees, and the left hip joint.  Her joint pain and inflammation have completely resolved.  She denies any new concerns at this time.  She states that overall her joint pain and inflammation have improved significantly on combination therapy. She denies any recent infections.     Activities of Daily Living:  Patient reports morning stiffness for 30 minutes.   Patient Reports nocturnal pain.  Difficulty dressing/grooming: Reports Difficulty climbing stairs: Reports Difficulty getting out of chair: Reports Difficulty using hands for taps, buttons, cutlery, and/or writing: Reports  Review of Systems  Constitutional:  Positive for fatigue.  HENT:  Positive for mouth dryness. Negative for mouth sores and nose dryness.   Eyes:  Positive for pain, redness, itching and dryness.  Respiratory:  Negative for shortness of breath and difficulty breathing.   Cardiovascular:  Positive for chest pain. Negative for palpitations.  Gastrointestinal:  Negative for blood in stool, constipation and diarrhea.  Endocrine: Positive for increased  urination.  Genitourinary:  Negative for difficulty urinating.  Musculoskeletal:  Positive for joint pain, joint pain, joint swelling, myalgias, morning stiffness, muscle tenderness and myalgias.  Skin:  Negative for color change, rash and redness.  Allergic/Immunologic: Negative for susceptible to infections.  Neurological:  Positive for dizziness, numbness, headaches and weakness. Negative for memory loss.  Hematological:  Negative for bruising/bleeding tendency.  Psychiatric/Behavioral:  Negative for confusion.    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   Pelvic pain 06/02/2017   Atypical squamous cell changes of undetermined significance (ASCUS) on cervical cytology with negative high risk human papilloma virus (HPV) test result 02/03/2017   Encounter for IUD insertion 01/28/2017   Uterine fibroid 01/21/2017   H/O tubal ligation 06/24/2016   High risk medication use 01/07/2016   GERD (gastroesophageal reflux disease) 12/25/2015   Migraines 12/25/2015   Hypertension 12/25/2015   RA (rheumatoid arthritis) ([place]) 12/25/2015   DDD (degenerative disc disease), cervical 12/25/2015   Osteoarthritis of both hands 12/25/2015   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/2015   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   TB lung, latent 05/14/2015   Bipolar 2 disorder ([place]) 05/14/2015   Cigarette smoker 05/05/2015   Chest pain 05/05/2015   Solitary pulmonary nodule 05/04/2015    Past Medical History:  Diagnosis Date   Anxiety    Bipolar 1 disorder ([place])  DDD (degenerative disc disease), cervical 12/25/2015   Depression    GERD (gastroesophageal reflux disease) 12/25/2015   Hypertension 12/25/2015   Migraine    Migraines 12/25/2015   OCD (obsessive compulsive disorder)    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/2015   mild   Osteoarthritis of both hands 12/25/2015   PTSD  (post-traumatic stress disorder)    RA (rheumatoid arthritis) ([place]) 12/25/2015   Positive RF, Elevated ESR, Positive CCP > 250     Family History  Problem Relation Age of Onset   Rheum arthritis Mother    Migraines Mother    Hypertension Mother    Colitis Mother    Rheum arthritis Maternal Grandmother    Rheum arthritis Maternal Aunt    Migraines Sister    Migraines Brother    Sickle cell anemia Daughter    Bipolar disorder Daughter    Anxiety disorder Daughter    Depression Daughter    Migraines Daughter    Arrhythmia Daughter    Migraines Son    ADD / ADHD Son    Seizures Son    Arrhythmia Son    Past Surgical History:  Procedure Laterality Date   CESAREAN SECTION     TUBAL LIGATION     Social History   Social History Narrative   Not on file   Immunization History  Administered Date(s) Administered   PFIZER(Purple Top)SARS-COV-2 Vaccination 01/05/2020, 01/26/2020     Objective: Vital Signs: BP (!) 147/100 (BP Location: Right Arm, Patient Position: Sitting, Cuff Size: Normal)   Pulse 83   Ht $R'5\' 4"'RB$  (1.626 m)   Wt 183 lb 9.6 oz (83.3 kg)   BMI 31.51 kg/m    Physical Exam Vitals and nursing note reviewed.  Constitutional:      Appearance: She is well-developed.  HENT:     Head: Normocephalic and atraumatic.  Eyes:     Conjunctiva/sclera: Conjunctivae normal.  Pulmonary:     Effort: Pulmonary effort is normal.  Abdominal:     Palpations: Abdomen is soft.  Musculoskeletal:     Cervical back: Normal range of motion.  Skin:    General: Skin is warm and dry.     Capillary Refill: Capillary refill takes less than 2 seconds.  Neurological:     Mental Status: She is alert and oriented to person, place, and time.  Psychiatric:        Behavior: Behavior normal.     Musculoskeletal Exam: C-spine, thoracic spine, lumbar spine have good range of motion with no discomfort.  Shoulder joints, elbow joints, wrist joints, MCPs, PIPs, DIPs have good range of motion  with no synovitis.  She was able to make a complete fist bilaterally.  Hip joints have good range of motion with no discomfort.  No tenderness over trochanteric bursa bilaterally.  Knee joints have good range of motion with no warmth or effusion.  Ankle joints have good range of motion with no tenderness or joint swelling.  No tenderness over MTP joints.   CDAI Exam: CDAI Score: 1  Patient Global: 5 mm; Provider Global: 5 mm Swollen: 0 ; Tender: 0  Joint Exam 10/03/2020   No joint exam has been documented for this visit   There is currently no information documented on the homunculus. Go to the Rheumatology activity and complete the homunculus joint exam.  Investigation: No additional findings.  Imaging: No results found.  Recent Labs: Lab Results  Component Value Date   WBC 6.8 08/26/2020   HGB 15.5 (H) 08/26/2020  PLT 340 08/26/2020   NA 137 08/26/2020   K 4.0 08/26/2020   CL 108 08/26/2020   CO2 22 08/26/2020   GLUCOSE 85 08/26/2020   BUN 10 08/26/2020   CREATININE 0.85 08/26/2020   BILITOT 0.7 08/26/2020   ALKPHOS 46 08/26/2020   AST 28 08/26/2020   ALT 45 (H) 08/26/2020   PROT 7.6 08/26/2020   ALBUMIN 3.7 08/26/2020   CALCIUM 9.6 08/26/2020   GFRAA 90 07/03/2020   QFTBGOLDPLUS NEGATIVE 01/02/2020    Speciality Comments: PLQ Eye Exam: WNL 03/24/17 @ [facility] Prior therapy: Humira (facial rash)  Procedures:  No procedures performed Allergies: Fluoxetine, Hydrocodone, Adalimumab, Hydrochlorothiazide, Hydrocodone-acetaminophen, Hydroxychloroquine, Other, and Vicodin [hydrocodone-acetaminophen]   Assessment / Plan:     Visit Diagnoses: Rheumatoid arthritis involving multiple sites with positive rheumatoid factor (HCC) - +CCP, +RF: She has no joint tenderness or synovitis on examination today.  She had a flare involving multiple joints including both hands, both knees, and the left hip starting on Friday which lasted through Sunday.  She took Tylenol as needed  and her symptoms have completely resolved.  She had not missed any doses of Enbrel or methotrexate prior to the flare.  She was restarted on Enbrel on 07/03/2020.  She was at a family reunion this weekend before which she feels exacerbated her joint pain and inflammation since she was more active than usual.  Overall she has found combination therapy to be effective at managing her rheumatoid arthritis.  She does not want to make any medication changes at this time since she has not been experiencing frequent flares.  She was strongly encouraged to keep track of the frequency of her flares on her calendar and to notify us accordingly.  She will remain on Enbrel 50 mg subcutaneous injections once weekly, methotrexate 0.8 mL apiece injections once weekly, folic acid 2 mg by mouth daily.  Discussed the importance of staying compliant taking these medications as prescribed.  She does not need any refills at this time.  She will follow up in 4 months.   High risk medication use - Enbrel 50 mg sq weekly injections, MTX 0.8 ml sq once weekly, and folic acid 2 mg po daily.CBC and CMP updated on 08/26/20. She will be due to update lab work in October and every 3 months.  Standing orders for CBC and CMP remain in place.   TB gold negative on 01/02/20. - Plan: QuantiFERON-TB Gold Plus She has not had any recent infections.  We discussed the importance of holding Enbrel and methotrexate if she develops signs or symptoms of an infection and to resume once infection has completely cleared.   Redness of both eyes: Resolved.  She has not been experiencing any eye pain, photophobia, or redness since resuming Enbrel and MTX.   Primary osteoarthritis of both hands: She is not experiencing any pain or stiffness in her hands at this time.   Primary osteoarthritis of both knees: She has good ROM of both knee joints with no discomfort.  No warmth or effusion noted.   Chondromalacia of both patellae   Primary osteoarthritis of  both feet: She is not experiencing any discomfort in her feet at this time.  She has good range of motion of both ankle joints with no tenderness or joint swelling.  No tenderness over MTP joints.  DDD (degenerative disc disease), cervical: She has good range of motion of the C-spine with no discomfort currently.  No symptoms of radiculopathy.  Other medical conditions  are listed as follows:   Essential hypertension  Solitary pulmonary nodule  Cigarette smoker  History of gastroesophageal reflux (GERD)  TB lung, latent - Treated in the past.  Her TB Gold has been negative.  History of migraine  Orders: Orders Placed This Encounter  Procedures   QuantiFERON-TB Gold Plus   No orders of the defined types were placed in this encounter.    Follow-Up Instructions: Return in about 4 months (around 02/02/2021) for Rheumatoid arthritis, Osteoarthritis.   Ofilia Neas, PA-C  Note - This record has been created using Dragon software.  Chart creation errors have been sought, but may not always  have been located. Such creation errors do not reflect on  the standard of medical care.

## 2020-10-03 ENCOUNTER — Ambulatory Visit (INDEPENDENT_AMBULATORY_CARE_PROVIDER_SITE_OTHER): Payer: Medicare Other | Admitting: Physician Assistant

## 2020-10-03 ENCOUNTER — Other Ambulatory Visit: Payer: Self-pay

## 2020-10-03 ENCOUNTER — Other Ambulatory Visit (HOSPITAL_COMMUNITY): Payer: Self-pay

## 2020-10-03 ENCOUNTER — Encounter: Payer: Self-pay | Admitting: Physician Assistant

## 2020-10-03 VITALS — BP 147/100 | HR 83 | Ht 64.0 in | Wt 183.6 lb

## 2020-10-03 DIAGNOSIS — M19072 Primary osteoarthritis, left ankle and foot: Secondary | ICD-10-CM

## 2020-10-03 DIAGNOSIS — M19041 Primary osteoarthritis, right hand: Secondary | ICD-10-CM | POA: Diagnosis not present

## 2020-10-03 DIAGNOSIS — F17210 Nicotine dependence, cigarettes, uncomplicated: Secondary | ICD-10-CM

## 2020-10-03 DIAGNOSIS — Z8669 Personal history of other diseases of the nervous system and sense organs: Secondary | ICD-10-CM

## 2020-10-03 DIAGNOSIS — M0579 Rheumatoid arthritis with rheumatoid factor of multiple sites without organ or systems involvement: Secondary | ICD-10-CM

## 2020-10-03 DIAGNOSIS — M2242 Chondromalacia patellae, left knee: Secondary | ICD-10-CM

## 2020-10-03 DIAGNOSIS — Z79899 Other long term (current) drug therapy: Secondary | ICD-10-CM | POA: Diagnosis not present

## 2020-10-03 DIAGNOSIS — Z227 Latent tuberculosis: Secondary | ICD-10-CM

## 2020-10-03 DIAGNOSIS — M2241 Chondromalacia patellae, right knee: Secondary | ICD-10-CM

## 2020-10-03 DIAGNOSIS — M19071 Primary osteoarthritis, right ankle and foot: Secondary | ICD-10-CM

## 2020-10-03 DIAGNOSIS — R911 Solitary pulmonary nodule: Secondary | ICD-10-CM

## 2020-10-03 DIAGNOSIS — M19042 Primary osteoarthritis, left hand: Secondary | ICD-10-CM

## 2020-10-03 DIAGNOSIS — Z8719 Personal history of other diseases of the digestive system: Secondary | ICD-10-CM

## 2020-10-03 DIAGNOSIS — M5030 Other cervical disc degeneration, unspecified cervical region: Secondary | ICD-10-CM

## 2020-10-03 DIAGNOSIS — I10 Essential (primary) hypertension: Secondary | ICD-10-CM

## 2020-10-03 DIAGNOSIS — H5789 Other specified disorders of eye and adnexa: Secondary | ICD-10-CM

## 2020-10-03 DIAGNOSIS — M170 Bilateral primary osteoarthritis of knee: Secondary | ICD-10-CM

## 2020-10-03 NOTE — Patient Instructions (Signed)
Standing Labs We placed an order today for your standing lab work.   Please have your standing labs drawn in October and every 3 moths   If possible, please have your labs drawn 2 weeks prior to your appointment so that the provider can discuss your results at your appointment.  Please note that you may see your imaging and lab results in [facility] before we have reviewed them. We may be awaiting multiple results to interpret others before contacting you. Please allow our office up to 72 hours to thoroughly review all of the results before contacting the office for clarification of your results.  We have open lab daily: Monday through Thursday from 1:30-4:30 PM and Friday from 1:30-4:00 PM at the office of Dr. Bo Merino, [facility].   Please be advised, all patients with office appointments requiring lab work will take precedent over walk-in lab work.  If possible, please come for your lab work on Monday and Friday afternoons, as you may experience shorter wait times. The office is located at [address] [place], [place], [place] [address] No appointment is necessary.   Labs are drawn by [facility]. Please bring your co-pay at the time of your lab draw.  You may receive a bill from [facility] for your lab work.  If you wish to have your labs drawn at another location, please call the office 24 hours in advance to send orders.  If you have any questions regarding directions or hours of operation,  please call [phone].   As a reminder, please drink plenty of water prior to coming for your lab work. Thanks!

## 2020-10-05 ENCOUNTER — Other Ambulatory Visit (HOSPITAL_COMMUNITY): Payer: Self-pay

## 2020-10-08 ENCOUNTER — Other Ambulatory Visit (HOSPITAL_COMMUNITY): Payer: Self-pay

## 2020-10-11 ENCOUNTER — Encounter: Payer: Self-pay | Admitting: Neurology

## 2020-10-11 ENCOUNTER — Ambulatory Visit: Payer: Medicaid Other | Admitting: Neurology

## 2020-10-28 ENCOUNTER — Other Ambulatory Visit: Payer: Self-pay | Admitting: Physician Assistant

## 2020-10-30 NOTE — Telephone Encounter (Signed)
Next Visit: 02/05/2021  Last Visit: 10/03/2020  Last Fill: 07/19/2020  DX: Rheumatoid arthritis involving multiple sites with positive rheumatoid factor  Current Dose per office note on 10/03/2020: MTX 0.8 ml sq once weekly  Labs: 08/26/2020 ALT 45, hemoglobin 15.5, eosinophils absolute 0.7  Okay to refill methotrexate?

## 2020-11-04 ENCOUNTER — Other Ambulatory Visit: Payer: Self-pay | Admitting: Rheumatology

## 2020-11-05 NOTE — Telephone Encounter (Signed)
Next Visit: 02/05/2021   Last Visit: 10/03/2020   Last Fill: 12/07/2019  DX: Rheumatoid arthritis involving multiple sites with positive rheumatoid factor  Okay to refill Syringes?

## 2020-11-14 ENCOUNTER — Other Ambulatory Visit (HOSPITAL_COMMUNITY): Payer: Self-pay

## 2020-11-14 ENCOUNTER — Other Ambulatory Visit: Payer: Self-pay | Admitting: Rheumatology

## 2020-11-14 MED ORDER — ENBREL SURECLICK 50 MG/ML ~~LOC~~ SOAJ
SUBCUTANEOUS | 0 refills | Status: DC
  Filled 2020-11-14: qty 4, 28d supply, fill #0
  Filled 2021-01-16: qty 4, 28d supply, fill #1
  Filled 2021-02-07: qty 4, 28d supply, fill #2

## 2020-11-14 NOTE — Telephone Encounter (Signed)
Next Visit: 02/05/2021  Last Visit: 10/03/2020  Last Fill: 07/03/2020  DX: Rheumatoid arthritis involving multiple sites with positive rheumatoid factor   Current Dose per office note 10/03/2020: Enbrel 50 mg sq weekly injections  Labs: 08/26/2020, Hemoglobin 15.5, Eosinophils Absolute 0.7, ALT 45, Urinalysis Hazy,   TB Gold: 01/02/2020, negative   Okay to refill Enbrel?

## 2020-12-11 ENCOUNTER — Other Ambulatory Visit (HOSPITAL_COMMUNITY): Payer: Self-pay

## 2020-12-17 ENCOUNTER — Other Ambulatory Visit (HOSPITAL_COMMUNITY): Payer: Self-pay

## 2021-01-03 ENCOUNTER — Other Ambulatory Visit (HOSPITAL_COMMUNITY): Payer: Self-pay

## 2021-01-10 ENCOUNTER — Other Ambulatory Visit (HOSPITAL_COMMUNITY): Payer: Self-pay

## 2021-01-16 ENCOUNTER — Other Ambulatory Visit (HOSPITAL_COMMUNITY): Payer: Self-pay

## 2021-01-17 ENCOUNTER — Encounter (HOSPITAL_COMMUNITY): Payer: Self-pay

## 2021-01-17 ENCOUNTER — Emergency Department (HOSPITAL_COMMUNITY)
Admission: EM | Admit: 2021-01-17 | Discharge: 2021-01-17 | Disposition: A | Discharge status: Home / Self Care | Payer: Medicare Other | Attending: Emergency Medicine | Admitting: Emergency Medicine

## 2021-01-17 ENCOUNTER — Emergency Department (HOSPITAL_COMMUNITY): Payer: Medicare Other

## 2021-01-17 ENCOUNTER — Other Ambulatory Visit: Payer: Self-pay

## 2021-01-17 DIAGNOSIS — J45901 Unspecified asthma with (acute) exacerbation: Secondary | ICD-10-CM | POA: Diagnosis not present

## 2021-01-17 DIAGNOSIS — J101 Influenza due to other identified influenza virus with other respiratory manifestations: Secondary | ICD-10-CM | POA: Insufficient documentation

## 2021-01-17 DIAGNOSIS — I10 Essential (primary) hypertension: Secondary | ICD-10-CM | POA: Insufficient documentation

## 2021-01-17 DIAGNOSIS — Z7951 Long term (current) use of inhaled steroids: Secondary | ICD-10-CM | POA: Diagnosis not present

## 2021-01-17 DIAGNOSIS — J189 Pneumonia, unspecified organism: Secondary | ICD-10-CM | POA: Insufficient documentation

## 2021-01-17 DIAGNOSIS — R000 Tachycardia, unspecified: Secondary | ICD-10-CM | POA: Insufficient documentation

## 2021-01-17 DIAGNOSIS — Z79899 Other long term (current) drug therapy: Secondary | ICD-10-CM | POA: Diagnosis not present

## 2021-01-17 DIAGNOSIS — Z20822 Contact with and (suspected) exposure to covid-19: Secondary | ICD-10-CM | POA: Diagnosis not present

## 2021-01-17 DIAGNOSIS — R059 Cough, unspecified: Secondary | ICD-10-CM | POA: Diagnosis present

## 2021-01-17 DIAGNOSIS — F17210 Nicotine dependence, cigarettes, uncomplicated: Secondary | ICD-10-CM | POA: Insufficient documentation

## 2021-01-17 LAB — RESP PANEL BY RT-PCR (FLU A&B, COVID) ARPGX2
Influenza A by PCR: POSITIVE — AB
Influenza B by PCR: NEGATIVE
SARS Coronavirus 2 by RT PCR: NEGATIVE

## 2021-01-17 IMAGING — CR DG CHEST 2V
2 series · 2 of 2 positions shown · non-contrast
Comparison: [DATE]

CLINICAL DATA: Cough and shortness of breath.

EXAM:
CHEST - 2 VIEW

[x chest ap]
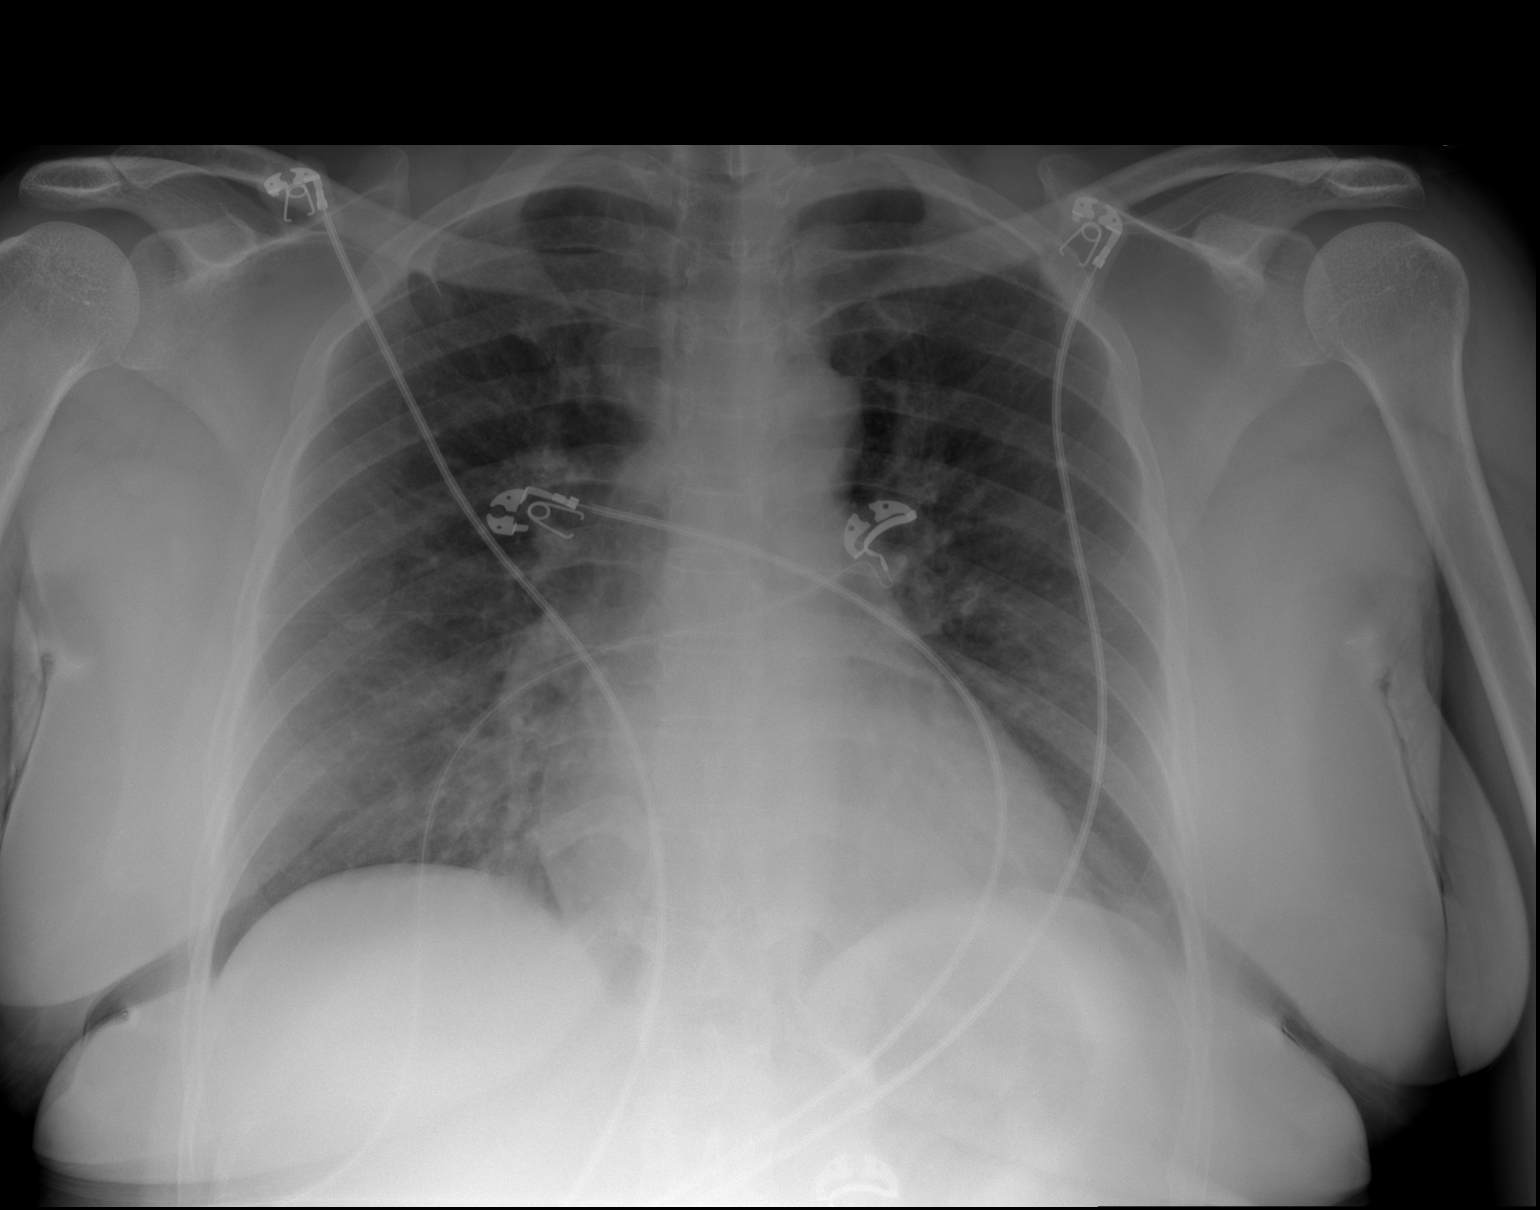

[w chest lat]
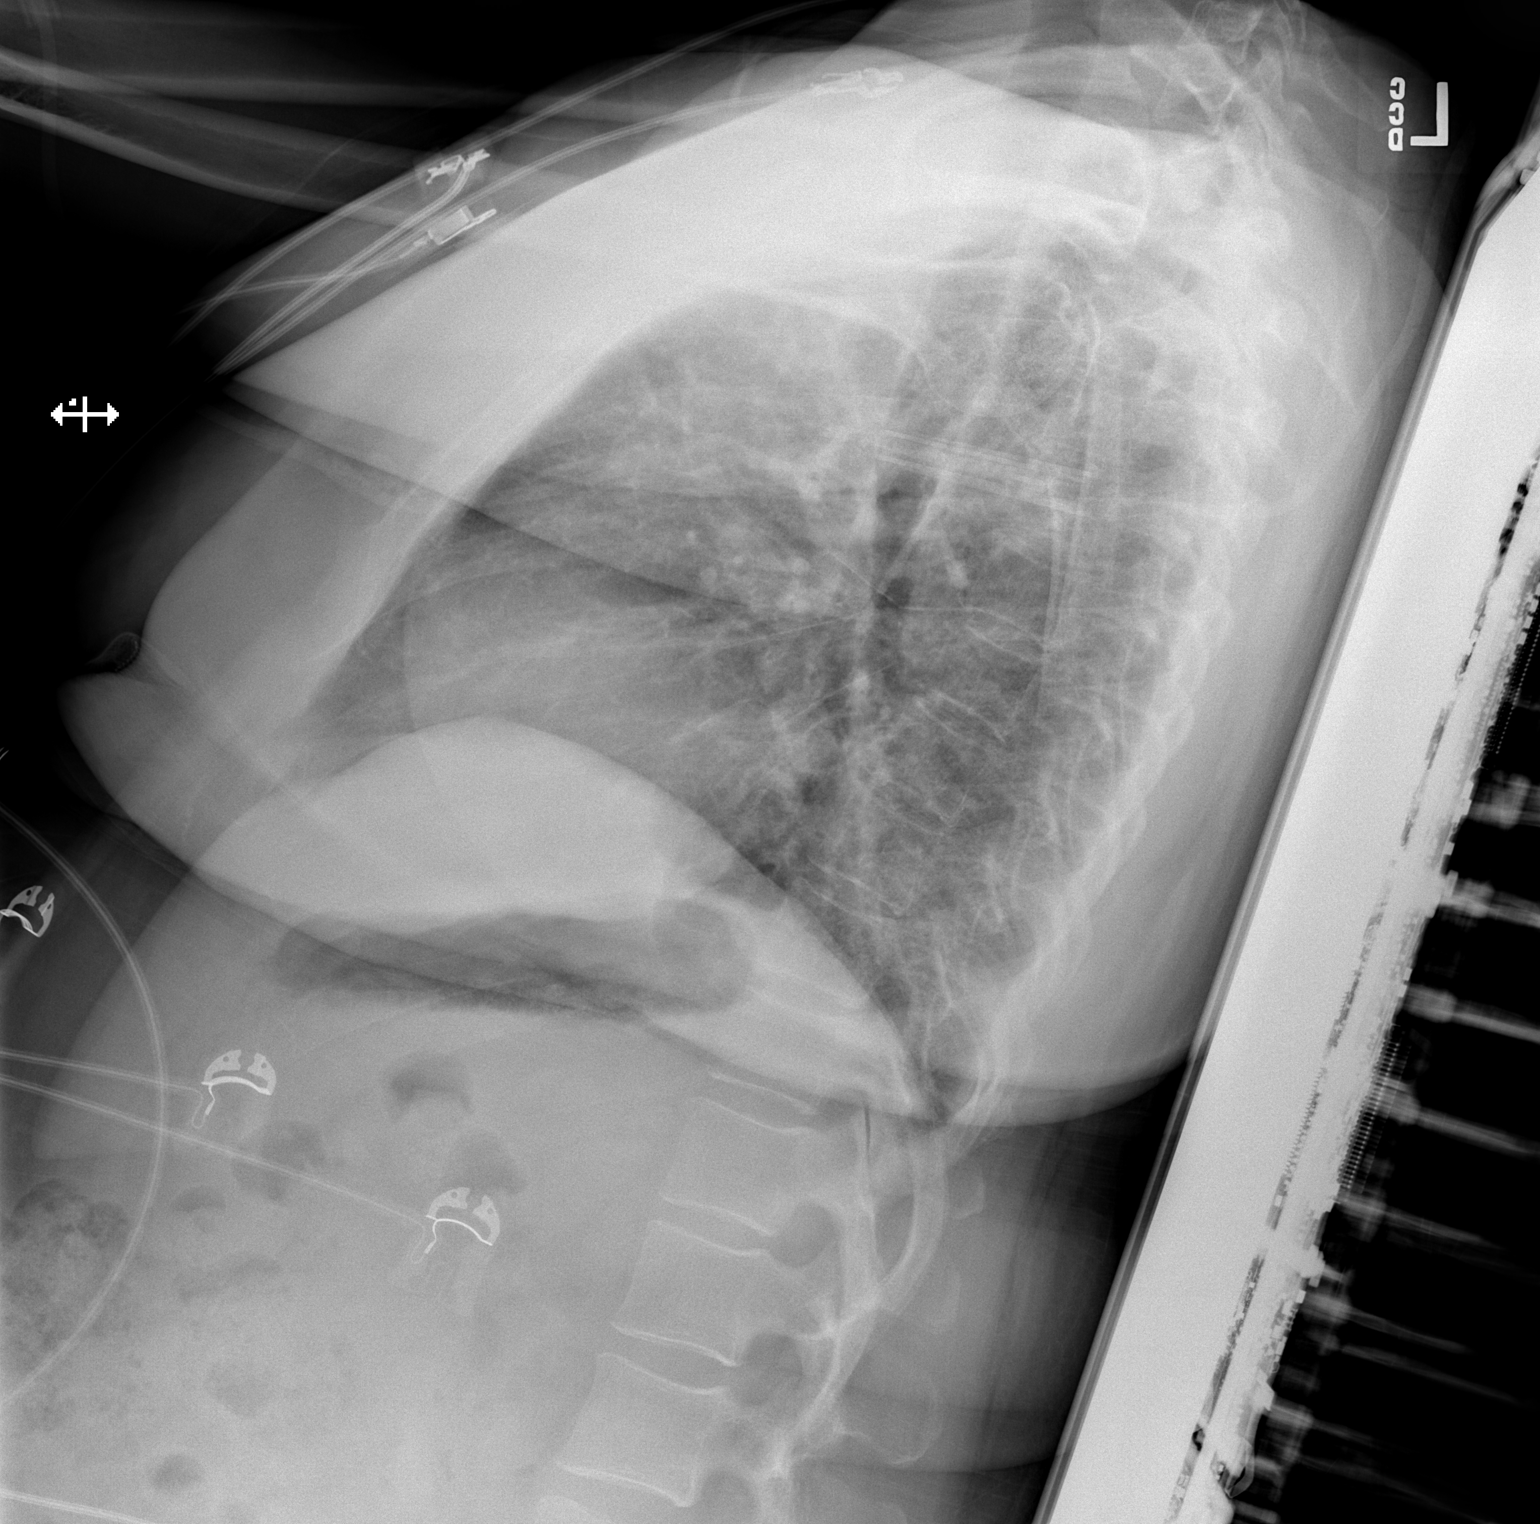

[2 of 2 positions shown; findings below may reference images not displayed]

FINDINGS: Subtle opacity in the right base. The heart, hila, mediastinum are
normal. No pneumothorax. Mild interstitial prominence.
IMPRESSION: 1. Subtle opacity in the right base could represent developing
pneumonia given history. Subtle interstitial/bronchitic prominence
consistent with history of bronchitis. Recommend short-term
follow-up imaging to ensure resolution.

## 2021-01-17 MED ORDER — ALBUTEROL SULFATE (2.5 MG/3ML) 0.083% IN NEBU
5.0000 mg | INHALATION_SOLUTION | Freq: Once | RESPIRATORY_TRACT | Status: AC
  Administered 2021-01-17: 5 mg via RESPIRATORY_TRACT
  Filled 2021-01-17: qty 6

## 2021-01-17 MED ORDER — SODIUM CHLORIDE 0.9 % IV SOLN
1.0000 g | Freq: Once | INTRAVENOUS | Status: AC
  Administered 2021-01-17: 1 g via INTRAVENOUS
  Filled 2021-01-17: qty 10

## 2021-01-17 MED ORDER — DOXYCYCLINE HYCLATE 100 MG PO CAPS: 100.0000 mg | ORAL_CAPSULE | Freq: Two times a day (BID) | ORAL | 0 refills | Status: AC

## 2021-01-17 MED ORDER — SODIUM CHLORIDE 0.9 % IV SOLN
500.0000 mg | Freq: Once | INTRAVENOUS | Status: AC
  Administered 2021-01-17: 500 mg via INTRAVENOUS
  Filled 2021-01-17: qty 500

## 2021-01-17 MED ORDER — PREDNISONE 50 MG PO TABS: 50.0000 mg | ORAL_TABLET | Freq: Every day | ORAL | 0 refills | Status: AC

## 2021-01-17 MED ORDER — METHYLPREDNISOLONE SODIUM SUCC 125 MG IJ SOLR
125.0000 mg | Freq: Once | INTRAMUSCULAR | Status: AC
  Administered 2021-01-17: 125 mg via INTRAVENOUS
  Filled 2021-01-17: qty 2

## 2021-01-17 MED ORDER — SODIUM CHLORIDE 0.9 % IV BOLUS
1000.0000 mL | Freq: Once | INTRAVENOUS | Status: AC
  Administered 2021-01-17: 1000 mL via INTRAVENOUS

## 2021-01-17 MED ORDER — IPRATROPIUM BROMIDE 0.02 % IN SOLN
0.5000 mg | Freq: Once | RESPIRATORY_TRACT | Status: AC
  Administered 2021-01-17: 0.5 mg via RESPIRATORY_TRACT
  Filled 2021-01-17: qty 2.5

## 2021-01-17 MED ORDER — AEROCHAMBER Z-STAT PLUS/MEDIUM MISC: 1.0000 | Freq: Once | Status: DC

## 2021-01-17 MED ORDER — LIDOCAINE 5 % EX PTCH
1.0000 | MEDICATED_PATCH | CUTANEOUS | Status: DC
  Administered 2021-01-17: 1 via TRANSDERMAL
  Filled 2021-01-17: qty 1

## 2021-01-17 MED ORDER — LIDOCAINE 5 % EX PTCH: 1.0000 | MEDICATED_PATCH | CUTANEOUS | 0 refills | Status: DC

## 2021-01-17 MED ORDER — ACETAMINOPHEN 325 MG PO TABS
650.0000 mg | ORAL_TABLET | Freq: Once | ORAL | Status: AC
  Administered 2021-01-17: 650 mg via ORAL
  Filled 2021-01-17: qty 2

## 2021-01-17 MED ORDER — MAGNESIUM SULFATE 2 GM/50ML IV SOLN
2.0000 g | Freq: Once | INTRAVENOUS | Status: AC
  Administered 2021-01-17: 2 g via INTRAVENOUS
  Filled 2021-01-17: qty 50

## 2021-01-17 NOTE — ED Triage Notes (Signed)
Pt c/o chest pain, congestion, cough, stuffy nose, and weakness X a week. Pt states daughter has been Dx with Flu Tuesday.

## 2021-01-17 NOTE — Discharge Instructions (Addendum)
You were given a prescription for antibiotics. Please take the antibiotic prescription fully.   Take steroids as directed   Use your albuterol inhaler every 4-6 hours as directed   Please follow up with your primary care provider within 5-7 days for re-evaluation of your symptoms. If you do not have a primary care provider, information for a healthcare clinic has been provided for you to make arrangements for follow up care. Please return to the emergency department for any new or worsening symptoms.

## 2021-01-17 NOTE — ED Provider Notes (Signed)
[facility] DEPT Provider Note   CSN: [id number] Arrival date & time: 01/17/21  1206     History Chief Complaint  Patient presents with   Chest Pain   Nasal Congestion   Cough   Weakness    Elizabeth Pope is a 40 y.o. female.  HPI  40 year old female with a history of anxiety, bipolar 1 disorder, DDD, depression, GERD, hypertension, migraines, OCD, osteoarthritis, PTSD, RA, asthma, who presents to the emergency department today for evaluation of URI symptoms.  Patient states that for the last week she has had a cough, congestion, wheezing.  She is also had some intermittent chest pain with cough.  She had some sweats and chills no documented fever at home.  She states her daughter recently tested positive for influenza.  Past Medical History:  Diagnosis Date   Anxiety    Bipolar 1 disorder ([place])    DDD (degenerative disc disease), cervical 12/25/2015   Depression    GERD (gastroesophageal reflux disease) 12/25/2015   Hypertension 12/25/2015   Migraine    Migraines 12/25/2015   OCD (obsessive compulsive disorder)    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/2015   mild   Osteoarthritis of both hands 12/25/2015   PTSD (post-traumatic stress disorder)    RA (rheumatoid arthritis) ([place]) 12/25/2015   Positive RF, Elevated ESR, Positive CCP > 250     Patient Active Problem List   Diagnosis Date Noted   Asthma with acute exacerbation 09/12/2018   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   Pelvic pain 06/02/2017   Atypical squamous cell changes of undetermined significance (ASCUS) on cervical cytology with negative high risk human papilloma virus (HPV) test result 02/03/2017   Encounter for IUD insertion 01/28/2017   Uterine fibroid 01/21/2017   H/O tubal ligation 06/24/2016   High risk medication use 01/07/2016   GERD (gastroesophageal reflux disease) 12/25/2015   Migraines 12/25/2015   Hypertension 12/25/2015   RA (rheumatoid  arthritis) ([place]) 12/25/2015   DDD (degenerative disc disease), cervical 12/25/2015   Osteoarthritis of both hands 12/25/2015   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/2015   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   TB lung, latent 05/14/2015   Bipolar 2 disorder ([place]) 05/14/2015   Cigarette smoker 05/05/2015   Chest pain 05/05/2015   Solitary pulmonary nodule 05/04/2015    Past Surgical History:  Procedure Laterality Date   CESAREAN SECTION     TUBAL LIGATION       OB History     Gravida  4   Para  3   Term  3   Preterm      AB  1   Living  3      SAB  1   IAB      Ectopic      Multiple      Live Births  3           Family History  Problem Relation Age of Onset   Rheum arthritis Mother    Migraines Mother    Hypertension Mother    Colitis Mother    Rheum arthritis Maternal Grandmother    Rheum arthritis Maternal Aunt    Migraines Sister    Migraines Brother    Sickle cell anemia Daughter    Bipolar disorder Daughter    Anxiety disorder Daughter    Depression Daughter    Migraines Daughter    Arrhythmia Daughter    Migraines Son    ADD / ADHD Son  Seizures Son    Arrhythmia Son     Social History   Tobacco Use   Smoking status: Some Days    Packs/day: 0.50    Years: 19.00    Pack years: 9.50    Types: Cigars, Cigarettes   Smokeless tobacco: Never   Tobacco comments:    former cigarette smoker  Vaping Use   Vaping Use: Former  Substance Use Topics   Alcohol use: Yes    Alcohol/week: 0.0 standard drinks    Comment: occasional   Drug use: No    Home Medications Prior to Admission medications   Medication Sig Start Date End Date Taking? Authorizing Provider  acetaminophen (TYLENOL) 325 MG tablet Take 650 mg by mouth every 6 (six) hours as needed for moderate pain.   Yes [provider]  albuterol (VENTOLIN HFA) 108 (90 Base) MCG/ACT inhaler Inhale 1-2 puffs into the lungs every 6 (six) hours as needed for wheezing or  shortness of breath. 12/11/18  Yes Burky, Lanelle Bal B, NP  amLODipine (NORVASC) 10 MG tablet Take 10 mg by mouth daily. 01/14/21  Yes [provider]  atorvastatin (LIPITOR) 40 MG tablet Take 40 mg by mouth daily. 06/04/20  Yes [provider]  B-D TB SYRINGE 1CC/27GX1/2" 27G X 1/2" 1 ML MISC Use as directed with MTX weekly 11/05/20   Ofilia Neas, PA-C  cetirizine (ZYRTEC) 10 MG tablet Take 10 mg by mouth daily. 06/12/16  Yes [provider]  diphenhydrAMINE (BENADRYL) 25 MG tablet Take 25 mg by mouth every 6 (six) hours as needed for itching.   Yes [provider]  doxycycline (VIBRAMYCIN) 100 MG capsule Take 1 capsule (100 mg total) by mouth 2 (two) times daily for 7 days. 01/17/21 01/24/21 Yes Janziel Hockett S, PA-C  EPINEPHrine 0.3 mg/0.3 mL IJ SOAJ injection Inject 0.3 mg into the muscle once as needed for anaphylaxis. 10/27/19  Yes [provider]  ergocalciferol (VITAMIN D2) 1.25 MG (50000 UT) capsule Take 50,000 Units by mouth once a week.   Yes [provider]  esomeprazole (NEXIUM) 20 MG capsule 20 mg daily.   Yes [provider]  etanercept (ENBREL SURECLICK) 50 MG/ML injection INJECT 0.98 MLS (50 MG TOTAL) INTO THE SKIN ONCE A WEEK. Patient taking differently: 50 mg once a week. Wednesday 11/14/20  Yes Ofilia Neas, PA-C  Fluticasone-Salmeterol (ADVAIR) 100-50 MCG/DOSE AEPB Inhale 1 puff into the lungs 2 (two) times daily.   Yes [provider]  folic acid (FOLVITE) 1 MG tablet TAKE 2 TABLETS BY MOUTH EVERY DAY Patient taking differently: Take 1 mg by mouth daily. 07/16/20  Yes Deveshwar, Abel Presto, MD  hydrOXYzine (ATARAX/VISTARIL) 25 MG tablet Take 25 mg by mouth 3 (three) times daily as needed for anxiety or itching.   Yes [provider]  lamoTRIgine (LAMICTAL) 100 MG tablet Take 100 mg daily by mouth. 03/18/16  Yes [provider]  lidocaine (LIDODERM) 5 % Place 1 patch onto the skin daily. Remove &  Discard patch within 12 hours or as directed by MD 01/17/21  Yes Rhyleigh Grassel S, PA-C  losartan (COZAAR) 50 MG tablet Take 50 mg by mouth daily. 02/21/20  Yes [provider]  methotrexate 50 MG/2ML injection ADMINISTER 0.8 ML(20 MG) UNDER THE SKIN 1 TIME A WEEK Patient taking differently: 20 mg once a week. Wednesday 05/22/20  Yes Ofilia Neas, PA-C  montelukast (SINGULAIR) 10 MG tablet Take 10 mg by mouth daily.   Yes [provider]  predniSONE (DELTASONE) 50 MG tablet Take 1 tablet (50 mg total) by mouth daily for 5 days. 01/17/21 01/22/21 Yes Tenna Lacko S, PA-C  RESTASIS 0.05 % ophthalmic emulsion Place 1 drop into both eyes 2 (two) times daily. 09/03/20  Yes [provider]  sertraline (ZOLOFT) 100 MG tablet Take 100 mg daily by mouth. 12/16/16  Yes [provider]  Methotrexate Sodium (METHOTREXATE, PF,) 50 MG/2ML injection Inject 0.4m under the skin once a week Patient not taking: Reported on 01/17/2021 10/30/20   DOfilia Neas PA-C  tiZANidine (ZANAFLEX) 4 MG tablet Take 1 tablet (4 mg total) by mouth at bedtime. Patient not taking: Reported on 10/03/2020 06/09/20   MJaynee Eagles PA-C    Allergies    Fluoxetine, Hydrocodone, Adalimumab, Hydrochlorothiazide, Hydrocodone-acetaminophen, Hydroxychloroquine, Other, and Vicodin [hydrocodone-acetaminophen]  Review of Systems   Review of Systems  Constitutional:  Positive for chills.  HENT:  Positive for congestion.   Eyes:  Negative for visual disturbance.  Respiratory:  Positive for cough, shortness of breath and wheezing.   Cardiovascular:  Negative for chest pain and leg swelling.  Gastrointestinal:  Negative for abdominal pain, constipation, diarrhea, nausea and vomiting.  Genitourinary:  Negative for dysuria.  Musculoskeletal:  Negative for back pain.  Skin:  Negative for rash.  Neurological:  Negative for headaches.  All other systems reviewed and are negative.  Physical Exam Updated  Vital Signs BP (!) 156/98   Pulse (!) 116   Temp 98 F (36.7 C) (Oral)   Resp 16   SpO2 98%   Physical Exam Vitals and nursing note reviewed.  Constitutional:      General: She is not in acute distress.    Appearance: She is well-developed.  HENT:     Head: Normocephalic and atraumatic.  Eyes:     Conjunctiva/sclera: Conjunctivae normal.  Cardiovascular:     Rate and Rhythm: Regular rhythm. Tachycardia present.     Heart sounds: No murmur heard. Pulmonary:     Effort: Pulmonary effort is normal. Tachypnea present. No respiratory distress.     Breath sounds: Wheezing present.     Comments: cough Abdominal:     Palpations: Abdomen is soft.     Tenderness: There is no abdominal tenderness.  Musculoskeletal:        General: No swelling.     Cervical back: Neck supple.  Skin:    General: Skin is warm and dry.     Capillary Refill: Capillary refill takes less than 2 seconds.  Neurological:     Mental Status: She is alert.  Psychiatric:        Mood and Affect: Mood normal.    ED Results / Procedures / Treatments   Labs (all labs ordered are listed, but only abnormal results are displayed) Labs Reviewed  RESP PANEL BY RT-PCR (FLU A&B, COVID) ARPGX2 - Abnormal; Notable for the following components:      Result Value   Influenza A by PCR POSITIVE (*)    All other components within normal limits    EKG EKG Interpretation  Date/Time:  Thursday January 17 2021 12:25:02 EST Ventricular Rate:  95 PR Interval:  177 QRS Duration: 81 QT Interval:  387 QTC Calculation: 487 R Axis:   41 Text Interpretation: Sinus rhythm Borderline prolonged QT interval Confirmed by TOctaviano Glow([phone] on 01/17/2021 12:28:50 PM  Radiology DG Chest 2 View  Result Date: 01/17/2021 CLINICAL DATA:  Cough and shortness of breath. EXAM: CHEST - 2 VIEW COMPARISON:  January 30, 2020 FINDINGS: Subtle opacity in the right base. The heart, hila, mediastinum are normal. No pneumothorax. Mild  interstitial prominence. IMPRESSION: 1. Subtle opacity in the right base could represent developing pneumonia given history. Subtle interstitial/bronchitic prominence consistent with history of bronchitis. Recommend short-term follow-up imaging to ensure resolution. Electronically Signed   By: Dorise Bullion III M.D.   On: 01/17/2021 13:06    Procedures Procedures   Medications Ordered in ED Medications  azithromycin (ZITHROMAX) 500 mg in sodium chloride 0.9 % 250 mL IVPB (500 mg Intravenous New Bag/Given 01/17/21 1507)  lidocaine (LIDODERM) 5 % 1 patch (1 patch Transdermal Patch Applied 01/17/21 1507)  aerochamber Z-Stat Plus/medium 1 each (has no administration in time range)  methylPREDNISolone sodium succinate (SOLU-MEDROL) 125 mg/2 mL injection 125 mg (125 mg Intravenous Given 01/17/21 1239)  magnesium sulfate IVPB 2 g 50 mL (0 g Intravenous Stopped 01/17/21 1337)  albuterol (PROVENTIL) (2.5 MG/3ML) 0.083% nebulizer solution 5 mg (5 mg Nebulization Given 01/17/21 1347)  ipratropium (ATROVENT) nebulizer solution 0.5 mg (0.5 mg Nebulization Given 01/17/21 1347)  sodium chloride 0.9 % bolus 1,000 mL (0 mLs Intravenous Stopped 01/17/21 1337)  cefTRIAXone (ROCEPHIN) 1 g in sodium chloride 0.9 % 100 mL IVPB (0 g Intravenous Stopped 01/17/21 1504)  acetaminophen (TYLENOL) tablet 650 mg (650 mg Oral Given 01/17/21 1405)  albuterol (PROVENTIL) (2.5 MG/3ML) 0.083% nebulizer solution 5 mg (5 mg Nebulization Given 01/17/21 1448)  ipratropium (ATROVENT) nebulizer solution 0.5 mg (0.5 mg Nebulization Given 01/17/21 1448)    ED Course  I have reviewed the triage vital signs and the nursing notes.  Pertinent labs & imaging results that were available during my care of the patient were reviewed by me and considered in my medical decision making (see chart for details).    MDM Rules/Calculators/A&P                          40 year old female presenting to the emergency department today for  evaluation of cough, shortness of breath and wheezing.  Has history of asthma.  Recent influenza exposure.  COVID/flu testing - pt positive for flu   EKG Sinus rhythm Borderline prolonged QT interval   Reviewed/interpreted imaging CXR - 1. Subtle opacity in the right base could represent developing pneumonia given history. Subtle interstitial/bronchitic prominence consistent with history of bronchitis. Recommend short-term follow-up imaging to ensure resolution.  Patient was given IV fluids, magnesium, steroids and albuterol/Atrovent nebulizer treatments.  On reassessment she feels much improved. She is no longer sob. She is still having some body aches. We discussed tx sxs with tylenol, lidoderm patches and heating pad. She is out of the window for tamiflu. She will be tx with abx for CAP as well as steroids for asthma exacerbation. Advised f/u with pcp and strict return precautions. All questions answered, pt stable for discharge   Final Clinical Impression(s) / ED Diagnoses Final diagnoses:  Influenza A  Community acquired pneumonia, unspecified laterality    Rx / DC Orders ED Discharge Orders          Ordered    doxycycline (VIBRAMYCIN) 100 MG capsule  2 times daily        01/17/21 1534    predniSONE (DELTASONE) 50 MG tablet  Daily        01/17/21 1534    lidocaine (LIDODERM) 5 %  Every 24 hours        01/17/21 1545  Rodney Booze, [place] 01/17/21 1546    Wyvonnia Dusky, MD 01/17/21 [phone]

## 2021-01-23 NOTE — Progress Notes (Signed)
Office Visit Note  Patient: Elizabeth Pope             Date of Birth: 20-Sep-1980           MRN: [id number]             PCP: Kristie Cowman, MD Referring: Kristie Cowman, MD Visit Date: 02/05/2021 Occupation: @GUAROCC @  Subjective:  No chief complaint on file.   History of Present Illness: Elizabeth Pope is a 40 y.o. female with a history of rheumatoid arthritis and osteoarthritis.  She has been taking Enbrel and methotrexate on a regular basis.  She denies any joint swelling.  She states that her left shoulder and her lower back has been painful.  She has difficulty raising her arm and getting dressed.  She has been also having nocturnal pain.  She continues to have discomfort in her knee joints and has difficulty climbing stairs.  She only has difficulty writing with the flares.  Activities of Daily Living:  Patient reports morning stiffness for several hours.   Patient Reports nocturnal pain.  Difficulty dressing/grooming: Reports Difficulty climbing stairs: Reports Difficulty getting out of chair: Reports Difficulty using hands for taps, buttons, cutlery, and/or writing: Reports  Review of Systems  Constitutional:  Positive for fatigue. Negative for night sweats, weight gain and weight loss.  HENT:  Positive for mouth dryness and nose dryness. Negative for mouth sores, trouble swallowing and trouble swallowing.   Eyes:  Positive for pain, redness, itching and dryness. Negative for visual disturbance.  Respiratory:  Negative for cough, shortness of breath and difficulty breathing.   Cardiovascular:  Positive for palpitations. Negative for chest pain, hypertension, irregular heartbeat and swelling in legs/feet.  Gastrointestinal:  Negative for blood in stool, constipation and diarrhea.  Endocrine: Negative for increased urination.  Genitourinary:  Negative for difficulty urinating and vaginal dryness.  Musculoskeletal:  Positive for joint pain, joint pain, myalgias,  morning stiffness, muscle tenderness and myalgias. Negative for joint swelling and muscle weakness.  Skin:  Negative for color change, rash, hair loss, redness, skin tightness, ulcers and sensitivity to sunlight.  Allergic/Immunologic: Negative for susceptible to infections.  Neurological:  Positive for dizziness, headaches and weakness. Negative for numbness, memory loss and night sweats.  Hematological:  Negative for bruising/bleeding tendency and swollen glands.  Psychiatric/Behavioral:  Negative for depressed mood, confusion and sleep disturbance. The patient is nervous/anxious.    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   Pelvic pain 06/02/2017   Atypical squamous cell changes of undetermined significance (ASCUS) on cervical cytology with negative high risk human papilloma virus (HPV) test result 02/03/2017   Encounter for IUD insertion 01/28/2017   Uterine fibroid 01/21/2017   H/O tubal ligation 06/24/2016   High risk medication use 01/07/2016   GERD (gastroesophageal reflux disease) 12/25/2015   Migraines 12/25/2015   Hypertension 12/25/2015   RA (rheumatoid arthritis) ([place]) 12/25/2015   DDD (degenerative disc disease), cervical 12/25/2015   Osteoarthritis of both hands 12/25/2015   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/2015   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   TB lung, latent 05/14/2015   Bipolar 2 disorder ([place]) 05/14/2015   Cigarette smoker 05/05/2015   Chest pain 05/05/2015   Solitary pulmonary nodule 05/04/2015    Past Medical History:  Diagnosis Date   Anxiety    Bipolar 1 disorder ([place])    DDD (degenerative disc disease),  cervical 12/25/2015   Depression    GERD (gastroesophageal reflux disease) 12/25/2015   Hypertension 12/25/2015   Migraine    Migraines 12/25/2015   OCD (obsessive compulsive disorder)    Osteoarthritis of both feet  12/25/2015   mild   Osteoarthritis of both hands 12/25/2015   PTSD (post-traumatic stress disorder)    RA (rheumatoid arthritis) ([place]) 12/25/2015   Positive RF, Elevated ESR, Positive CCP > 250     Family History  Problem Relation Age of Onset   Rheum arthritis Mother    Migraines Mother    Hypertension Mother    Colitis Mother    Rheum arthritis Maternal Grandmother    Rheum arthritis Maternal Aunt    Migraines Sister    Migraines Brother    Sickle cell anemia Daughter    Bipolar disorder Daughter    Anxiety disorder Daughter    Depression Daughter    Migraines Daughter    Arrhythmia Daughter    Migraines Son    ADD / ADHD Son    Seizures Son    Arrhythmia Son    Past Surgical History:  Procedure Laterality Date   CESAREAN SECTION     TUBAL LIGATION     Social History   Social History Narrative   Not on file   Immunization History  Administered Date(s) Administered   PFIZER(Purple Top)SARS-COV-2 Vaccination 01/05/2020, 01/26/2020     Objective: Vital Signs: BP 128/89 (BP Location: Left Arm, Patient Position: Sitting, Cuff Size: Normal)   Pulse 84   Ht 5' 4"  (1.626 m)   Wt 184 lb (83.5 kg)   BMI 31.58 kg/m    Physical Exam Vitals and nursing note reviewed.  Constitutional:      Appearance: She is well-developed.  HENT:     Head: Normocephalic and atraumatic.  Eyes:     Conjunctiva/sclera: Conjunctivae normal.  Cardiovascular:     Rate and Rhythm: Normal rate and regular rhythm.     Heart sounds: Normal heart sounds.  Pulmonary:     Effort: Pulmonary effort is normal.     Breath sounds: Normal breath sounds.  Abdominal:     General: Bowel sounds are normal.     Palpations: Abdomen is soft.  Musculoskeletal:     Cervical back: Normal range of motion.  Lymphadenopathy:     Cervical: No cervical adenopathy.  Skin:    General: Skin is warm and dry.     Capillary Refill: Capillary refill takes less than 2 seconds.  Neurological:     Mental Status:  She is alert and oriented to person, place, and time.  Psychiatric:        Behavior: Behavior normal.     Musculoskeletal Exam: C-spine was in good range of motion.  She had left trapezius spasm.  She had painful range of motion of her lumbar spine.  She had painful range of motion of her left shoulder joint.  Elbow joints, wrist joints, MCPs PIPs and DIPs with good range of motion with no synovitis.  Hip joints, knee joints, ankles, MTPs and PIPs with good range of motion with no synovitis.  She had positive tender points and hyperalgesia.  CDAI Exam: CDAI Score: 2.1  Patient Global: 8 mm; Provider Global: 3 mm Swollen: 0 ; Tender: 2  Joint Exam 02/05/2021      Right  Left  Glenohumeral      Tender  Lumbar Spine   Tender        Investigation: No additional findings.  Imaging: DG Chest 2 View  Result Date: 01/17/2021 CLINICAL DATA:  Cough and shortness of breath. EXAM: CHEST - 2 VIEW COMPARISON:  January 30, 2020 FINDINGS: Subtle opacity in the right base. The heart, hila, mediastinum are normal. No pneumothorax. Mild interstitial prominence. IMPRESSION: 1. Subtle opacity in the right base could represent developing pneumonia given history. Subtle interstitial/bronchitic prominence consistent with history of bronchitis. Recommend short-term follow-up imaging to ensure resolution. Electronically Signed   By: Dorise Bullion III M.D.   On: 01/17/2021 13:06    Recent Labs: Lab Results  Component Value Date   WBC 6.8 08/26/2020   HGB 15.5 (H) 08/26/2020   PLT 340 08/26/2020   NA 137 08/26/2020   K 4.0 08/26/2020   CL 108 08/26/2020   CO2 22 08/26/2020   GLUCOSE 85 08/26/2020   BUN 10 08/26/2020   CREATININE 0.85 08/26/2020   BILITOT 0.7 08/26/2020   ALKPHOS 46 08/26/2020   AST 28 08/26/2020   ALT 45 (H) 08/26/2020   PROT 7.6 08/26/2020   ALBUMIN 3.7 08/26/2020   CALCIUM 9.6 08/26/2020   GFRAA 90 07/03/2020   QFTBGOLDPLUS NEGATIVE 01/02/2020    Speciality Comments: PLQ  Eye Exam: WNL 03/24/17 @ [facility] Prior therapy: Humira (facial rash)  Procedures:  Trigger Point Inj  Date/Time: 02/05/2021 3:15 PM Performed by: Bo Merino, MD Authorized by: Bo Merino, MD   Consent Given by:  Patient Site marked: the procedure site was marked   Timeout: prior to procedure the correct patient, procedure, and site was verified   Indications:  Muscle spasm and pain Total # of Trigger Points:  1 Location: neck   Needle Size:  27 G Approach:  Dorsal Medications #1:  0.5 mL lidocaine 1 %; 10 mg triamcinolone acetonide 40 MG/ML Patient tolerance:  Patient tolerated the procedure well with no immediate complications Allergies: Doxycycline, Fluoxetine, Hydrocodone, Adalimumab, Hydrochlorothiazide, Hydrocodone-acetaminophen, Hydroxychloroquine, Other, and Vicodin [hydrocodone-acetaminophen]   Assessment / Plan:     Visit Diagnoses: Rheumatoid arthritis involving multiple sites with positive rheumatoid factor (HCC) - +CCP, +RF: Patient complains of pain and discomfort in multiple joints.  She had no synovitis was noted on the examination.  High risk medication use - Enbrel 50 mg sq weekly injections, MTX 0.8 ml sq once weekly, and folic acid 2 mg po daily.  She has been advised to stop Enbrel and methotrexate if she develops an infection and resume after the infection resolves.  Information regarding immunization was placed in the AVS.  She was also advised to have annual skin examination while she is on Enbrel to screen for skin cancer.  She has been noncompliant with the labs.  We will get labs today and then every 3 months to monitor for drug toxicity.  We will also obtain TB Gold today.  Last TB gold was in November 2021.  Redness of both eyes -she had conjunctival injection today.  She relates it to allergies.  Chronic left shoulder pain -she complains of pain and discomfort in her left shoulder joint for a long time.  She had painful range of motion.   Plan: XR Shoulder Left.  X-rays of the left shoulder joint were within normal limits.  Primary osteoarthritis of both hands-she complains of discomfort in her bilateral hands.  No synovitis was noted.  Primary osteoarthritis of both knees-no synovitis, warmth or effusion was noted.  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  Primary osteoarthritis of both feet-proper fitting shoes were discussed.  Trapezius muscle spasm-she had left trapezius spasm.  After informed consent was obtained and different treatment options were discussed the left trapezius area was injected with lidocaine and cortisone as described above.  She tolerated the procedure well.  Postprocedure instructions were given.  DDD (degenerative disc disease), cervical-patient used to see an orthopedic surgeon in the past.  Chronic midline low back pain without sciatica -she complains of chronic lower back pain which is severe.  She denies any radiculopathy.  Plan: XR Lumbar Spine 2-3 Views.  X-rays of the lumbar spine were unremarkable.  Fibromyalgia-she had generalized hyperalgesia and positive tender points.  I believe she has fibromyalgia syndrome.  Detailed counseling on a fibromyalgia syndrome was provided.  Need for regular exercise, water aerobics and stretching was discussed.  I will refer her to physical therapy.  Essential hypertension  Solitary pulmonary nodule  Cigarette smoker-smoking cessation was discussed.  History of migraine  History of gastroesophageal reflux (GERD)  TB lung, latent - Treated in the past.  Her TB Gold has been negative.  Orders: Orders Placed This Encounter  Procedures   Trigger Point Inj   XR Shoulder Left   XR Lumbar Spine 2-3 Views   CBC with Differential/Platelet   COMPLETE METABOLIC PANEL WITH GFR   QuantiFERON-TB Gold Plus   Sedimentation rate    No orders of the defined types were placed in this encounter.    Follow-Up Instructions:  Return in about 3 months (around 05/06/2021) for Rheumatoid arthritis.   Bo Merino, MD  Note - This record has been created using Editor, commissioning.  Chart creation errors have been sought, but may not always  have been located. Such creation errors do not reflect on  the standard of medical care.

## 2021-01-26 ENCOUNTER — Other Ambulatory Visit: Payer: Self-pay | Admitting: Physician Assistant

## 2021-01-28 NOTE — Telephone Encounter (Signed)
Next Visit: 02/05/2021   Last Visit: 10/03/2020   Last Fill: 10/30/2020  DX: Rheumatoid arthritis involving multiple sites with positive rheumatoid factor    Current Dose per office note 10/03/2020:Current Dose per office note on 10/03/2020: MTX 0.8 ml sq once weekly  Labs 08/26/2020, Hemoglobin 15.5, Eosinophils Absolute 0.7, ALT 45, Urinalysis Hazy,   Patient to update labs at upcoming appointment on 02/05/2021  Okay to refill MTX?

## 2021-01-29 ENCOUNTER — Other Ambulatory Visit (HOSPITAL_COMMUNITY): Payer: Self-pay

## 2021-02-05 ENCOUNTER — Encounter: Payer: Self-pay | Admitting: Rheumatology

## 2021-02-05 ENCOUNTER — Ambulatory Visit: Payer: Self-pay

## 2021-02-05 ENCOUNTER — Ambulatory Visit (INDEPENDENT_AMBULATORY_CARE_PROVIDER_SITE_OTHER): Payer: Medicare Other | Admitting: Rheumatology

## 2021-02-05 ENCOUNTER — Other Ambulatory Visit: Payer: Self-pay

## 2021-02-05 VITALS — BP 128/89 | HR 84 | Ht 64.0 in | Wt 184.0 lb

## 2021-02-05 DIAGNOSIS — M19041 Primary osteoarthritis, right hand: Secondary | ICD-10-CM

## 2021-02-05 DIAGNOSIS — M19072 Primary osteoarthritis, left ankle and foot: Secondary | ICD-10-CM

## 2021-02-05 DIAGNOSIS — H5789 Other specified disorders of eye and adnexa: Secondary | ICD-10-CM

## 2021-02-05 DIAGNOSIS — G8929 Other chronic pain: Secondary | ICD-10-CM

## 2021-02-05 DIAGNOSIS — M62838 Other muscle spasm: Secondary | ICD-10-CM | POA: Diagnosis not present

## 2021-02-05 DIAGNOSIS — Z8719 Personal history of other diseases of the digestive system: Secondary | ICD-10-CM

## 2021-02-05 DIAGNOSIS — M2241 Chondromalacia patellae, right knee: Secondary | ICD-10-CM

## 2021-02-05 DIAGNOSIS — M5030 Other cervical disc degeneration, unspecified cervical region: Secondary | ICD-10-CM

## 2021-02-05 DIAGNOSIS — M25512 Pain in left shoulder: Secondary | ICD-10-CM

## 2021-02-05 DIAGNOSIS — Z79899 Other long term (current) drug therapy: Secondary | ICD-10-CM

## 2021-02-05 DIAGNOSIS — M2242 Chondromalacia patellae, left knee: Secondary | ICD-10-CM

## 2021-02-05 DIAGNOSIS — M19042 Primary osteoarthritis, left hand: Secondary | ICD-10-CM

## 2021-02-05 DIAGNOSIS — M0579 Rheumatoid arthritis with rheumatoid factor of multiple sites without organ or systems involvement: Secondary | ICD-10-CM

## 2021-02-05 DIAGNOSIS — I10 Essential (primary) hypertension: Secondary | ICD-10-CM

## 2021-02-05 DIAGNOSIS — R911 Solitary pulmonary nodule: Secondary | ICD-10-CM

## 2021-02-05 DIAGNOSIS — M797 Fibromyalgia: Secondary | ICD-10-CM

## 2021-02-05 DIAGNOSIS — M170 Bilateral primary osteoarthritis of knee: Secondary | ICD-10-CM

## 2021-02-05 DIAGNOSIS — M19071 Primary osteoarthritis, right ankle and foot: Secondary | ICD-10-CM

## 2021-02-05 DIAGNOSIS — F17210 Nicotine dependence, cigarettes, uncomplicated: Secondary | ICD-10-CM

## 2021-02-05 DIAGNOSIS — M545 Low back pain, unspecified: Secondary | ICD-10-CM

## 2021-02-05 DIAGNOSIS — Z227 Latent tuberculosis: Secondary | ICD-10-CM

## 2021-02-05 DIAGNOSIS — Z8669 Personal history of other diseases of the nervous system and sense organs: Secondary | ICD-10-CM

## 2021-02-05 MED ORDER — LIDOCAINE HCL 1 % IJ SOLN
0.5000 mL | INTRAMUSCULAR | Status: AC | PRN
  Administered 2021-02-05: .5 mL

## 2021-02-05 MED ORDER — TRIAMCINOLONE ACETONIDE 40 MG/ML IJ SUSP
10.0000 mg | INTRAMUSCULAR | Status: AC | PRN
  Administered 2021-02-05: 10 mg via INTRAMUSCULAR

## 2021-02-05 NOTE — Patient Instructions (Addendum)
Standing Labs We placed an order today for your standing lab work.   Please have your standing labs drawn in March and every 3 months  If possible, please have your labs drawn 2 weeks prior to your appointment so that the provider can discuss your results at your appointment.  Please note that you may see your imaging and lab results in [place] before we have reviewed them. We may be awaiting multiple results to interpret others before contacting you. Please allow our office up to 72 hours to thoroughly review all of the results before contacting the office for clarification of your results.  We have open lab daily: Monday through Thursday from 1:30-4:30 PM and Friday from 1:30-4:00 PM at the office of Dr. Bo Merino, [facility].   Please be advised, all patients with office appointments requiring lab work will take precedent over walk-in lab work.  If possible, please come for your lab work on Monday and Friday afternoons, as you may experience shorter wait times. The office is located at [address] [place], [place], [place] [address] No appointment is necessary.   Labs are drawn by [facility]. Please bring your co-pay at the time of your lab draw.  You may receive a bill from [facility] for your lab work.  If you wish to have your labs drawn at another location, please call the office 24 hours in advance to send orders.  If you have any questions regarding directions or hours of operation,  please call [phone].   As a reminder, please drink plenty of water prior to coming for your lab work. Thanks!   Vaccines You are taking a medication(s) that can suppress your immune system.  The following immunizations are recommended: Flu annually Covid-19  Td/Tdap (tetanus, diphtheria, pertussis) every 10 years Pneumonia (Prevnar 15 then Pneumovax 23 at least 1 year apart.  Alternatively, can take Prevnar 20 without needing additional dose) Shingrix: 2 doses from 4 weeks  to 6 months apart  Please check with your PCP to make sure you are up to date.   If you have signs or symptoms of an infection or start antibiotics: First, call your PCP for workup of your infection. Hold your medication through the infection, until you complete your antibiotics, and until symptoms resolve if you take the following: Injectable medication (Actemra, Benlysta, Cimzia, Cosentyx, Enbrel, Humira, Kevzara, Orencia, Remicade, Simponi, Stelara, Taltz, Tremfya) Methotrexate Leflunomide (Arava) Mycophenolate (Cellcept) Roma Kayser, or Rinvoq  Please get annual skin examination by dermatologist to screen for skin cancer while you are on Enbrel.

## 2021-02-06 NOTE — Progress Notes (Signed)
Inflammation test is mildly increased.  Liver function has improved but is still mildly elevated.  Eosinophils are mildly elevated and stable representing allergies.  Recommend ultrasound of bilateral hands to look for synovitis.  Please remind patient to schedule an appointment with the ophthalmologist for evaluation of redness in her eyes.

## 2021-02-07 ENCOUNTER — Telehealth: Payer: Self-pay | Admitting: Pharmacy Technician

## 2021-02-07 ENCOUNTER — Telehealth: Payer: Self-pay

## 2021-02-07 ENCOUNTER — Other Ambulatory Visit: Payer: Self-pay

## 2021-02-07 ENCOUNTER — Encounter: Payer: Self-pay | Admitting: Rheumatology

## 2021-02-07 ENCOUNTER — Other Ambulatory Visit (HOSPITAL_COMMUNITY): Payer: Self-pay

## 2021-02-07 ENCOUNTER — Ambulatory Visit: Payer: Self-pay

## 2021-02-07 ENCOUNTER — Ambulatory Visit (INDEPENDENT_AMBULATORY_CARE_PROVIDER_SITE_OTHER): Payer: Medicare Other | Admitting: Rheumatology

## 2021-02-07 VITALS — BP 154/104 | HR 82 | Ht 65.0 in | Wt 184.0 lb

## 2021-02-07 DIAGNOSIS — M19041 Primary osteoarthritis, right hand: Secondary | ICD-10-CM

## 2021-02-07 DIAGNOSIS — M19071 Primary osteoarthritis, right ankle and foot: Secondary | ICD-10-CM

## 2021-02-07 DIAGNOSIS — M0579 Rheumatoid arthritis with rheumatoid factor of multiple sites without organ or systems involvement: Secondary | ICD-10-CM

## 2021-02-07 DIAGNOSIS — I10 Essential (primary) hypertension: Secondary | ICD-10-CM

## 2021-02-07 DIAGNOSIS — M2242 Chondromalacia patellae, left knee: Secondary | ICD-10-CM

## 2021-02-07 DIAGNOSIS — M2241 Chondromalacia patellae, right knee: Secondary | ICD-10-CM

## 2021-02-07 DIAGNOSIS — Z8719 Personal history of other diseases of the digestive system: Secondary | ICD-10-CM

## 2021-02-07 DIAGNOSIS — M5030 Other cervical disc degeneration, unspecified cervical region: Secondary | ICD-10-CM

## 2021-02-07 DIAGNOSIS — Z79899 Other long term (current) drug therapy: Secondary | ICD-10-CM

## 2021-02-07 DIAGNOSIS — Z8669 Personal history of other diseases of the nervous system and sense organs: Secondary | ICD-10-CM

## 2021-02-07 DIAGNOSIS — M62838 Other muscle spasm: Secondary | ICD-10-CM

## 2021-02-07 DIAGNOSIS — G8929 Other chronic pain: Secondary | ICD-10-CM

## 2021-02-07 DIAGNOSIS — M79642 Pain in left hand: Secondary | ICD-10-CM | POA: Diagnosis not present

## 2021-02-07 DIAGNOSIS — M19072 Primary osteoarthritis, left ankle and foot: Secondary | ICD-10-CM

## 2021-02-07 DIAGNOSIS — R911 Solitary pulmonary nodule: Secondary | ICD-10-CM

## 2021-02-07 DIAGNOSIS — M79641 Pain in right hand: Secondary | ICD-10-CM

## 2021-02-07 DIAGNOSIS — M170 Bilateral primary osteoarthritis of knee: Secondary | ICD-10-CM

## 2021-02-07 DIAGNOSIS — H5789 Other specified disorders of eye and adnexa: Secondary | ICD-10-CM

## 2021-02-07 DIAGNOSIS — M797 Fibromyalgia: Secondary | ICD-10-CM

## 2021-02-07 DIAGNOSIS — F17210 Nicotine dependence, cigarettes, uncomplicated: Secondary | ICD-10-CM

## 2021-02-07 DIAGNOSIS — F3181 Bipolar II disorder: Secondary | ICD-10-CM

## 2021-02-07 DIAGNOSIS — M25512 Pain in left shoulder: Secondary | ICD-10-CM | POA: Diagnosis not present

## 2021-02-07 DIAGNOSIS — M545 Low back pain, unspecified: Secondary | ICD-10-CM

## 2021-02-07 DIAGNOSIS — M19042 Primary osteoarthritis, left hand: Secondary | ICD-10-CM

## 2021-02-07 NOTE — Progress Notes (Signed)
Office Visit Note  Patient: Elizabeth Pope             Date of Birth: 1980-08-01           MRN: [id number]             PCP: Kristie Cowman, MD Referring: Kristie Cowman, MD Visit Date: 02/07/2021 Occupation: _0 @  Subjective:  Pain in both hands  History of Present Illness: Elizabeth Pope is a 40 y.o. female with a history of rheumatoid arthritis and osteoarthritis.  She returns today to get ultrasound of her bilateral hands today.  She states she has significant pain and swelling in her bilateral hands in the morning which gets better during the day.  She has difficulty raising her left arm due to pain and discomfort in her left shoulder.  She has not noticed much improvement with the trigger point injection which was given 2 days ago.  She has ongoing discomfort in her knee joints.  She has not noticed any swelling in her knees or her feet.  Activities of Daily Living:  Patient reports morning stiffness for 4 hours.   Patient Reports nocturnal pain.  Difficulty dressing/grooming: Reports Difficulty climbing stairs: Reports Difficulty getting out of chair: Reports Difficulty using hands for taps, buttons, cutlery, and/or writing: Reports  Review of Systems  Constitutional:  Positive for fatigue. Negative for night sweats, weight gain and weight loss.  HENT:  Positive for mouth dryness. Negative for mouth sores, trouble swallowing, trouble swallowing and nose dryness.   Eyes:  Negative for pain, redness, visual disturbance and dryness.  Respiratory:  Negative for cough, shortness of breath and difficulty breathing.   Cardiovascular:  Negative for chest pain, palpitations, hypertension, irregular heartbeat and swelling in legs/feet.  Gastrointestinal:  Negative for blood in stool, constipation and diarrhea.  Endocrine: Negative for increased urination.  Genitourinary:  Negative for vaginal dryness.  Musculoskeletal:  Positive for joint pain, joint pain, joint  swelling, myalgias, morning stiffness and myalgias. Negative for muscle weakness and muscle tenderness.  Skin:  Negative for color change, rash, hair loss, skin tightness, ulcers and sensitivity to sunlight.  Allergic/Immunologic: Negative for susceptible to infections.  Neurological:  Positive for headaches. Negative for dizziness, memory loss, night sweats and weakness.  Hematological:  Negative for swollen glands.  Psychiatric/Behavioral:  Positive for sleep disturbance. Negative for depressed mood. The patient is not nervous/anxious.    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   Pelvic pain 06/02/2017   Atypical squamous cell changes of undetermined significance (ASCUS) on cervical cytology with negative high risk human papilloma virus (HPV) test result 02/03/2017   Encounter for IUD insertion 01/28/2017   Uterine fibroid 01/21/2017   H/O tubal ligation 06/24/2016   High risk medication use 01/07/2016   GERD (gastroesophageal reflux disease) 12/25/2015   Migraines 12/25/2015   Hypertension 12/25/2015   RA (rheumatoid arthritis) ([place]) 12/25/2015   DDD (degenerative disc disease), cervical 12/25/2015   Osteoarthritis of both hands 12/25/2015   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/2015   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   TB lung, latent 05/14/2015   Bipolar 2 disorder ([place]) 05/14/2015   Cigarette smoker 05/05/2015   Chest pain 05/05/2015   Solitary pulmonary nodule 05/04/2015    Past Medical History:  Diagnosis Date   Anxiety    Bipolar 1 disorder ([place])    DDD (degenerative  disease), cervical 12/25/2015  ° Depression   ° GERD (gastroesophageal reflux disease) 12/25/2015  ° Hypertension 12/25/2015  ° Migraine   ° Migraines 12/25/2015  ° OCD (obsessive compulsive disorder)   ° 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/2015  ° mild  ° Osteoarthritis of both  hands 12/25/2015  ° PTSD (post-traumatic stress disorder)   ° RA (rheumatoid arthritis) (HCC) 12/25/2015  ° Positive RF, Elevated ESR, Positive CCP > 250   °  °Family History  °Problem Relation Age of Onset  ° Rheum arthritis Mother   ° Migraines Mother   ° Hypertension Mother   ° Colitis Mother   ° Rheum arthritis Maternal Grandmother   ° Rheum arthritis Maternal Aunt   ° Migraines Sister   ° Migraines Brother   ° Sickle cell anemia Daughter   ° Bipolar disorder Daughter   ° Anxiety disorder Daughter   ° Depression Daughter   ° Migraines Daughter   ° Arrhythmia Daughter   ° Migraines Son   ° ADD / ADHD Son   ° Seizures Son   ° Arrhythmia Son   ° °Past Surgical History:  °Procedure Laterality Date  ° CESAREAN SECTION    ° TUBAL LIGATION    ° °Social History  ° °Social History Narrative  ° Not on file  ° °Immunization History  °Administered Date(s) Administered  ° PFIZER(Purple Top)SARS-COV-2 Vaccination 01/05/2020, 01/26/2020  °  ° °Objective: °Vital Signs: Ht 5' 5" (1.651 m)    Wt 184 lb (83.5 kg)    BMI 30.62 kg/m²   ° °Physical Exam °Vitals and nursing note reviewed.  °Constitutional:   °   Appearance: She is well-developed.  °HENT:  °   Head: Normocephalic and atraumatic.  °Eyes:  °   Conjunctiva/sclera: Conjunctivae normal.  °Cardiovascular:  °   Rate and Rhythm: Normal rate and regular rhythm.  °   Heart sounds: Normal heart sounds.  °Pulmonary:  °   Effort: Pulmonary effort is normal.  °   Breath sounds: Normal breath sounds.  °Abdominal:  °   General: Bowel sounds are normal.  °   Palpations: Abdomen is soft.  °Musculoskeletal:  °   Cervical back: Normal range of motion.  °Lymphadenopathy:  °   Cervical: No cervical adenopathy.  °Skin: °   General: Skin is warm and dry.  °   Capillary Refill: Capillary refill takes less than 2 seconds.  °Neurological:  °   Mental Status: She is alert and oriented to person, place, and time.  °Psychiatric:     °   Behavior: Behavior normal.  °  ° °Musculoskeletal Exam:  C-spine was in good range of motion.  She had tenderness over left trapezius region.  She had discomfort range of motion of her left shoulder joint.  Elbow joints, wrist joints, MCPs PIPs and DIPs with good range of motion with no tenderness.  Hip joints and knee joints with good range of motion.  She had discomfort range of motion of her knee joints.  No warmth swelling or effusion was noted. ° °CDAI Exam: °CDAI Score: 2.2  °Patient Global: 8 mm; Provider Global: 4 mm °Swollen: 0 ; Tender: 1  °Joint Exam 02/07/2021  ° °   Right  Left  °Glenohumeral      Tender  ° ° ° °Investigation: °No additional findings. ° °Imaging: °DG Chest 2 View ° °Result Date: 01/17/2021 °CLINICAL DATA:  Cough and shortness of breath. EXAM: CHEST - 2 VIEW COMPARISON:  January 30, 2020 FINDINGS:   Subtle opacity in the right base. The heart, hila, mediastinum are normal. No pneumothorax. Mild interstitial prominence. IMPRESSION: 1. Subtle opacity in the right base could represent developing pneumonia given history. Subtle interstitial/bronchitic prominence consistent with history of bronchitis. Recommend short-term follow-up imaging to ensure resolution. Electronically Signed   By: David  Williams III M.D.   On: 01/17/2021 13:06  ° °US COMPLETE JOINT SPACE STRUCTURES UP BILAT ° °Result Date: 02/07/2021 °Ultrasound examination of the bilateral hands was performed per EULAR recommendations. Using 15 MHz transducer, grayscale and power Doppler bilateral second, third, and fifth MCP joints and bilateral wrist joints both dorsal and volar aspects were evaluated to look for synovitis or tenosynovitis. The findings were there was  synovitis over bilateral second third and fifth MCP joints and wrist joints.  Tenosynovitis was noted in the right wrist joint on ultrasound examination. Right median nerve was 0.07 cm squares which was within normal limits and left median nerve was 0.05 cm squares which was within normal limits Impression: Ultrasound  examination showed synovitis in bilateral wrist joints and tenosynovitis in the right wrist joint.  Bilateral median nerves are within normal limits. ° °XR Lumbar Spine 2-3 Views ° °Result Date: 02/05/2021 °No disc space narrowing or facet joint arthropathy was noted.  No SI joint sclerosis or narrowing was noted. Impression: Unremarkable x-rays of the lumbar spine. ° °XR Shoulder Left ° °Result Date: 02/05/2021 °No glenohumeral or acromioclavicular joint space narrowing was noted.  No chondrocalcinosis was noted. Impression: Unremarkable x-rays of the shoulder joint.  ° °Recent Labs: °Lab Results  °Component Value Date  ° WBC 7.8 02/05/2021  ° HGB 14.0 02/05/2021  ° PLT 304 02/05/2021  ° NA 137 02/05/2021  ° K 4.0 02/05/2021  ° CL 105 02/05/2021  ° CO2 24 02/05/2021  ° GLUCOSE 84 02/05/2021  ° BUN 15 02/05/2021  ° CREATININE 0.78 02/05/2021  ° BILITOT 0.3 02/05/2021  ° ALKPHOS 46 08/26/2020  ° AST 19 02/05/2021  ° ALT 30 (H) 02/05/2021  ° PROT 7.1 02/05/2021  ° ALBUMIN 3.7 08/26/2020  ° CALCIUM 9.5 02/05/2021  ° GFRAA 90 07/03/2020  ° QFTBGOLDPLUS NEGATIVE 01/02/2020  ° ° °Speciality Comments: PLQ Eye Exam: WNL 03/24/17 @ [facility] °Prior therapy: Humira (facial rash) ° °Procedures:  °No procedures performed °Allergies: Doxycycline, Fluoxetine, Hydrocodone, Adalimumab, Hydrochlorothiazide, Hydrocodone-acetaminophen, Hydroxychloroquine, Other, and Vicodin [hydrocodone-acetaminophen]  ° °Assessment / Plan:     °Visit Diagnoses: Rheumatoid arthritis involving multiple sites with positive rheumatoid factor (HCC)-she has some ongoing pain and discomfort in her multiple joints.  She reports significant pain and swelling in her bilateral hands especially in the morning.  No synovitis was noted on the examination although ultrasound examination obtained today showed synovitis in multiple joints and tenosynovitis of the right wrist joint.  Ultrasound findings were discussed with the patient at length.  She has been on  Enbrel and methotrexate combination.  She states that Enbrel was initially working but then it quit working.  I detailed discussion regarding different treatment options and their side effects.  After reviewing indications side effects contraindications we decided to proceed with Orencia.  Handout was given and consent was taken.  We will apply for subcutaneous Orencia.  Patient was advised to stop Enbrel 1 week prior to Orencia injection.  She will continue methotrexate as prescribed. ° °Medication counseling:  °TB Gold: February 05, 2021 °Hepatitis panel: March 12, 2018 °HIV: Jun 24, 2016 °SPEP: Will obtain with next labs °Immunoglobulins: We will obtain with next labs ° °  Does patient have a diagnosis of COPD? No ° °Counseled patient that Orencia is a selective T-cell costimulation blocker indicated for rheumatoid arthritis.  Counseled patient on purpose, proper use, and adverse effects of Orencia. The most common adverse effects are increased risk of infections, headache, and infusion reactions.  There is the possibility of an increased risk of malignancy but it is not well understood if this increased risk is due to the medication or the disease state.  Reviewed the importance of regular labs while on Orencia therapy.  Counseled patient that Orencia should be held prior to scheduled surgery.  Counseled patient to avoid live vaccines while on Orencia.  Advised patient to get annual influenza vaccine and the pneumococcal vaccine as indicated.  Provided patient with medication education material and answered all questions.  Patient consented to Orencia.  Will upload consent into patient's chart.  Will submit benefit's investigation for Orencia. ° ° °High risk medication use -she is currently on Enbrel 50 mg subcu weekly which she will discontinue.  She is on methotrexate 0.8 mL subcu weekly.  She was advised to stop Enbrel 1 week prior to Orencia dosage.  We will get labs in 1 month after starting Orencia and then  every 3 months to monitor for drug toxicity.  She was advised to stop Orencia and methotrexate in case she develops an infection and restart after the infection resolves.  Information regarding mineralizations was placed in the AVS.  Plan: Serum protein electrophoresis with reflex, IgG, IgA, IgM ° °Chronic left shoulder pain-she continues to have left shoulder joint discomfort. ° °Pain in both hands -she has pain and discomfort in her bilateral hands but no synovitis was noted.  Plan: US COMPLETE JOINT SPACE STRUCTURES UP BILAT ° °Redness of both eyes-she relates redness to allergies.  She was advised to schedule an appointment with the ophthalmologist. ° °Primary osteoarthritis of both hands-chronic pain and stiffness. ° °Primary osteoarthritis of both knees-she continues to have pain and discomfort in her knee joints. ° °Chondromalacia of both patellae ° °Primary osteoarthritis of both feet-chronic pain. ° °Trapezius muscle spasm-she had left trapezius injection 2 days ago and has not noticed any improvement. ° °DDD (degenerative disc disease), cervical ° °Chronic midline low back pain without sciatica ° °Fibromyalgia-she was referred to physical therapy. ° °Essential hypertension-blood pressure is elevated today.  She has been advised to monitor blood pressure closely and follow-up with the PCP. ° °Solitary pulmonary nodule ° °Cigarette smoker-smoking cessation was discussed at length.  Association of smoking with rheumatoid arthritis was also discussed. ° °History of migraine ° °History of gastroesophageal reflux (GERD) ° °Bipolar 2 disorder (HCC) ° °Orders: °Orders Placed This Encounter  °Procedures  ° US COMPLETE JOINT SPACE STRUCTURES UP BILAT  ° Serum protein electrophoresis with reflex  ° IgG, IgA, IgM  ° °No orders of the defined types were placed in this encounter. ° ° °Follow-Up Instructions: Return in about 8 weeks (around 04/04/2021) for Rheumatoid arthritis. ° ° ° , MD ° °Note - This  record has been created using Dragon software.  °Chart creation errors have been sought, but may not always  °have been located. Such creation errors do not reflect on  °the standard of medical care. ° °

## 2021-02-07 NOTE — Patient Instructions (Signed)
Standing Labs We placed an order today for your standing lab work.   Please have your standing labs drawn in 1 month and then every 3 months.  If possible, please have your labs drawn 2 weeks prior to your appointment so that the provider can discuss your results at your appointment.  Please note that you may see your imaging and lab results in [place] before we have reviewed them. We may be awaiting multiple results to interpret others before contacting you. Please allow our office up to 72 hours to thoroughly review all of the results before contacting the office for clarification of your results.  We have open lab daily: Monday through Thursday from 1:30-4:30 PM and Friday from 1:30-4:00 PM at the office of Dr. Bo Merino, [facility].   Please be advised, all patients with office appointments requiring lab work will take precedent over walk-in lab work.  If possible, please come for your lab work on Monday and Friday afternoons, as you may experience shorter wait times. The office is located at [address], [place], [place], [place] [address] No appointment is necessary.   Labs are drawn by [facility]. Please bring your co-pay at the time of your lab draw.  You may receive a bill from [facility] for your lab work.  If you wish to have your labs drawn at another location, please call the office 24 hours in advance to send orders.  If you have any questions regarding directions or hours of operation,  please call [phone].   As a reminder, please drink plenty of water prior to coming for your lab work. Thanks!   Abatacept solution for injection (subcutaneous or intravenous use) What is this medication? ABATACEPT (a ba TA sept) is used to treat rheumatoid arthritis, psoriatic arthritis, and juvenile idiopathic arthritis. It prevents acute graft-versus-host disease following a stem-cell transplant. This medicine may be used for other purposes; ask your health care  provider or pharmacist if you have questions. COMMON BRAND NAME(S): Minna Antis ClickJect What should I tell my care team before I take this medication? They need to know if you have any of these conditions: cancer diabetes hepatitis B or history of hepatitis B infection immune system problems infection or history of infection (especially a virus infection such as chickenpox, cold sores, or herpes) lung or breathing problems, like chronic obstructive pulmonary disease (COPD) recently received or scheduled to receive a vaccination scheduled to have surgery tuberculosis, a positive skin test for tuberculosis, or have recently been in close contact with someone who has tuberculosis an unusual or allergic reaction to abatacept, other medicines, foods, dyes, or preservatives pregnant or trying to get pregnant breast-feeding How should I use this medication? This medicine is for infusion into a vein or for injection under the skin. Infusions are given by a health care professional in a hospital or clinic setting. If you are to give your own medicine at home, you will be taught how to prepare and give this medicine under the skin. Use exactly as directed. Take your medicine at regular intervals. Do not take your medicine more often than directed. It is important that you put your used needles and syringes in a special sharps container. Do not put them in a trash can. If you do not have a sharps container, call your pharmacist or health care provider to get one. Talk to your pediatrician regarding the use of this medicine in children. While infusions in a clinic may be prescribed for children  as young as 2 years for selected conditions, precautions do apply. Overdosage: If you think you have taken too much of this medicine contact a poison control center or emergency room at once. NOTE: This medicine is only for you. Do not share this medicine with others. What if I miss a dose? This medicine is  used once a week if given by injection under the skin. If you miss a dose, take it as soon as you can. If it is almost time for your next dose, take only that dose. Do not take double or extra doses. If you are to be given an infusion of this medicine, it is important not to miss your dose. Doses are usually every 4 weeks. Call your doctor or health care professional if you are unable to keep an appointment. What may interact with this medication? Do not take this medicine with any of the following medications: live vaccines This medicine may also interact with the following medications: anakinra baricitinib canakinumab medicines that lower your chance of fighting an infection rituximab TNF blockers such as adalimumab, certolizumab, etanercept, golimumab, infliximab tocilizumab tofacitinib upadacitinib ustekinumab This list may not describe all possible interactions. Give your health care provider a list of all the medicines, herbs, non-prescription drugs, or dietary supplements you use. Also tell them if you smoke, drink alcohol, or use illegal drugs. Some items may interact with your medicine. What should I watch for while using this medication? Visit your doctor for regular checks on your progress. Tell your doctor or health care professional if your symptoms do not start to get better or if they get worse. You will be tested for tuberculosis (TB) before you start this medicine. If your doctor prescribed any medicine for TB, you should start taking the TB medicine before starting this medicine. Make sure to finish the full course of TB medicine. This medicine may increase your risk of getting an infection. Call your doctor or health care professional if you get fever, chills, or sore throat, or other symptoms of a cold or flu. Do not treat yourself. Try to avoid being around people who are sick. If you have diabetes and are getting this medicine in a vein, the infusion can give false high  blood sugar readings on the day of your dose. This may happen if you use certain types of blood glucose tests. Your health care provider may tell you to use a different way to monitor your blood sugar levels. What side effects may I notice from receiving this medication? Side effects that you should report to your doctor or health care professional as soon as possible: allergic reactions like skin rash, itching or hives, swelling of the face, lips, or tongue breathing problems chest pain dizziness signs and symptoms of infection like fever; chills; cough; sore throat; pain or trouble passing urine unusually weak or tired Side effects that usually do not require medical attention (report to your doctor or health care professional if they continue or are bothersome): diarrhea headache nausea pain, redness, or irritation at site where injected stomach pain or upset This list may not describe all possible side effects. Call your doctor for medical advice about side effects. You may report side effects to FDA at 1-800-FDA-1088. Where should I keep my medication? Infusions will be given in a hospital or clinic and will not be stored at home. Storage for syringes and autoinjectors stored at home: Keep out of the reach of children. Store in a refrigerator between 2 and  8 degrees C (36 and 46 degrees F). Keep this medicine in the original container. Protect from light. Do not freeze. Do not shake. Throw away any unused medicine after the expiration date. NOTE: This sheet is a summary. It may not cover all possible information. If you have questions about this medicine, talk to your doctor, pharmacist, or health care provider.  2022 Elsevier/Gold Standard (2020-03-22 00:00:00)     Vaccines You are taking a medication(s) that can suppress your immune system.  The following immunizations are recommended: Flu annually Covid-19  Td/Tdap (tetanus, diphtheria, pertussis) every 10 years Pneumonia  (Prevnar 15 then Pneumovax 23 at least 1 year apart.  Alternatively, can take Prevnar 20 without needing additional dose) Shingrix: 2 doses from 4 weeks to 6 months apart  Please check with your PCP to make sure you are up to date.   If you test POSITIVE for COVID19 and have MILD to MODERATE symptoms: First, call your PCP if you would like to receive COVID19 treatment AND Hold your medications during the infection and for at least 1 week after your symptoms have resolved: Injectable medication (Benlysta, Cimzia, Cosentyx, Enbrel, Humira, Orencia, Remicade, Simponi, Stelara, Taltz, Tremfya) Methotrexate Leflunomide (Arava) Azathioprine Mycophenolate (Cellcept) Roma Kayser, or Rinvoq Otezla If you take Actemra or Kevzara, you DO NOT need to hold these for COVID19 infection.  If you test POSITIVE for COVID19 and have NO symptoms: First, call your PCP if you would like to receive COVID19 treatment AND Hold your medications for at least 10 days after the day that you tested positive Injectable medication (Benlysta, Cimzia, Cosentyx, Enbrel, Humira, Orencia, Remicade, Simponi, Stelara, Taltz, Tremfya) Methotrexate Leflunomide (Arava) Azathioprine Mycophenolate (Cellcept) Roma Kayser, or Rinvoq Otezla If you take Actemra or Kevzara, you DO NOT need to hold these for COVID19 infection.   If you have signs or symptoms of an infection or start antibiotics: First, call your PCP for workup of your infection. Hold your medication through the infection, until you complete your antibiotics, and until symptoms resolve if you take the following: Injectable medication (Actemra, Benlysta, Cimzia, Cosentyx, Enbrel, Humira, Kevzara, Orencia, Remicade, Simponi, Stelara, Taltz, Tremfya) Methotrexate Leflunomide (Arava) Mycophenolate (Cellcept) Morrie Sheldon, Olumiant, or Rinvoq

## 2021-02-07 NOTE — Telephone Encounter (Signed)
Please apply for subcutaneous orencia per Dr. Estanislado Pandy.   Consent obtained and sent to the scan center. Thanks!

## 2021-02-07 NOTE — Telephone Encounter (Signed)
Received notification from [facility] regarding a prior authorization for ENBREL. Authorization has been APPROVED from 02/24/21 to 02/23/22.   Authorization # [id number]

## 2021-02-07 NOTE — Telephone Encounter (Signed)
Submitted a Prior Authorization request to [facility] for ENBREL via CoverMyMeds. Will update once we receive a response.   (Key: [id number]) - [id number]

## 2021-02-08 LAB — CBC WITH DIFFERENTIAL/PLATELET
Absolute Monocytes: 702 cells/uL (ref 200–950)
Basophils Absolute: 47 cells/uL (ref 0–200)
Basophils Relative: 0.6 %
Eosinophils Absolute: 671 cells/uL — ABNORMAL HIGH (ref 15–500)
Eosinophils Relative: 8.6 %
HCT: 41.9 % (ref 35.0–45.0)
Hemoglobin: 14 g/dL (ref 11.7–15.5)
Lymphs Abs: 2948 cells/uL (ref 850–3900)
MCH: 30.2 pg (ref 27.0–33.0)
MCHC: 33.4 g/dL (ref 32.0–36.0)
MCV: 90.3 fL (ref 80.0–100.0)
MPV: 10.3 fL (ref 7.5–12.5)
Monocytes Relative: 9 %
Neutro Abs: 3432 cells/uL (ref 1500–7800)
Neutrophils Relative %: 44 %
Platelets: 304 10*3/uL (ref 140–400)
RBC: 4.64 10*6/uL (ref 3.80–5.10)
RDW: 13.7 % (ref 11.0–15.0)
Total Lymphocyte: 37.8 %
WBC: 7.8 10*3/uL (ref 3.8–10.8)

## 2021-02-08 LAB — COMPLETE METABOLIC PANEL WITH GFR
AG Ratio: 1.2 (calc) (ref 1.0–2.5)
ALT: 30 U/L — ABNORMAL HIGH (ref 6–29)
AST: 19 U/L (ref 10–30)
Albumin: 3.9 g/dL (ref 3.6–5.1)
Alkaline phosphatase (APISO): 56 U/L (ref 31–125)
BUN: 15 mg/dL (ref 7–25)
CO2: 24 mmol/L (ref 20–32)
Calcium: 9.5 mg/dL (ref 8.6–10.2)
Chloride: 105 mmol/L (ref 98–110)
Creat: 0.78 mg/dL (ref 0.50–0.99)
Globulin: 3.2 g/dL (calc) (ref 1.9–3.7)
Glucose, Bld: 84 mg/dL (ref 65–99)
Potassium: 4 mmol/L (ref 3.5–5.3)
Sodium: 137 mmol/L (ref 135–146)
Total Bilirubin: 0.3 mg/dL (ref 0.2–1.2)
Total Protein: 7.1 g/dL (ref 6.1–8.1)
eGFR: 98 mL/min/{1.73_m2} (ref >=60)

## 2021-02-08 LAB — QUANTIFERON-TB GOLD PLUS
Mitogen-NIL: >10 IU/mL
NIL: 0.06 IU/mL
QuantiFERON-TB Gold Plus: NEGATIVE
TB1-NIL: 0.01 IU/mL
TB2-NIL: 0 IU/mL

## 2021-02-08 LAB — SEDIMENTATION RATE: Sed Rate: 34 mm/h — ABNORMAL HIGH (ref 0–20)

## 2021-02-12 NOTE — Telephone Encounter (Signed)
Submitted a Prior Authorization request to [facility] for [facility] via CoverMyMeds. Will update once we receive a response.  Key: [place], PharmD, MPH, BCPS Clinical Pharmacist (Rheumatology and Pulmonology)  Knox Saliva, PharmD, MPH, BCPS Clinical Pharmacist (Rheumatology and Pulmonology)

## 2021-02-13 ENCOUNTER — Other Ambulatory Visit (HOSPITAL_COMMUNITY): Payer: Self-pay

## 2021-02-13 MED ORDER — ORENCIA CLICKJECT 125 MG/ML ~~LOC~~ SOAJ
125.0000 mg | SUBCUTANEOUS | 0 refills | Status: DC
  Filled 2021-02-13: qty 4, fill #0
  Filled 2021-02-13: qty 4, 28d supply, fill #0

## 2021-02-13 NOTE — Telephone Encounter (Signed)
Delivery instructions have been updated in [place], medication will be couriered to [facility] by 02/14/2021.  Rx has been processed in [facility] and the patient has no copay at this time.

## 2021-02-13 NOTE — Telephone Encounter (Signed)
Received notification from [facility] regarding a prior authorization for [facility]. Authorization has been APPROVED from 02/12/21 to 02/23/22.   Per test claim, copay for 28 days supply is $0  Patient can fill through [place]: [phone]   Authorization # [id number]  Rx sent to [facility] - patient scheduled for new start on 02/19/21  Patient takes Enbrel on Saturdays. Advised to hold Enbrel dose that is due this Saturday. She can continue methotrexate which she takes on Sundays.  Knox Saliva, PharmD, MPH, BCPS Clinical Pharmacist (Rheumatology and Pulmonology)

## 2021-02-14 ENCOUNTER — Other Ambulatory Visit (HOSPITAL_COMMUNITY): Payer: Self-pay

## 2021-02-14 NOTE — Telephone Encounter (Signed)
Orencia received from [facility]. Placed in refrigerator for new start visit on 02/19/21  Knox Saliva, PharmD, MPH, BCPS Clinical Pharmacist (Rheumatology and Pulmonology)

## 2021-02-18 ENCOUNTER — Encounter (HOSPITAL_COMMUNITY): Payer: Self-pay | Admitting: Radiology

## 2021-02-19 ENCOUNTER — Other Ambulatory Visit (HOSPITAL_COMMUNITY): Payer: Self-pay

## 2021-02-19 ENCOUNTER — Ambulatory Visit (INDEPENDENT_AMBULATORY_CARE_PROVIDER_SITE_OTHER): Payer: Medicare Other | Admitting: Pharmacist

## 2021-02-19 ENCOUNTER — Emergency Department (HOSPITAL_COMMUNITY)
Admission: EM | Admit: 2021-02-19 | Discharge: 2021-02-20 | Disposition: A | Discharge status: Home / Self Care | Payer: Medicare Other | Attending: Emergency Medicine | Admitting: Emergency Medicine

## 2021-02-19 ENCOUNTER — Encounter (HOSPITAL_COMMUNITY): Payer: Self-pay

## 2021-02-19 ENCOUNTER — Other Ambulatory Visit: Payer: Self-pay

## 2021-02-19 VITALS — BP 136/97 | HR 90

## 2021-02-19 DIAGNOSIS — Z7189 Other specified counseling: Secondary | ICD-10-CM

## 2021-02-19 DIAGNOSIS — L299 Pruritus, unspecified: Secondary | ICD-10-CM | POA: Diagnosis present

## 2021-02-19 DIAGNOSIS — Z5321 Procedure and treatment not carried out due to patient leaving prior to being seen by health care provider: Secondary | ICD-10-CM | POA: Insufficient documentation

## 2021-02-19 DIAGNOSIS — M0579 Rheumatoid arthritis with rheumatoid factor of multiple sites without organ or systems involvement: Secondary | ICD-10-CM | POA: Diagnosis not present

## 2021-02-19 DIAGNOSIS — Z79899 Other long term (current) drug therapy: Secondary | ICD-10-CM

## 2021-02-19 MED ORDER — ORENCIA CLICKJECT 125 MG/ML ~~LOC~~ SOAJ
125.0000 mg | SUBCUTANEOUS | 1 refills | Status: DC
  Filled 2021-02-19: qty 4, 28d supply, fill #0

## 2021-02-19 MED ORDER — PREDNISONE 20 MG PO TABS
60.0000 mg | ORAL_TABLET | Freq: Once | ORAL | Status: AC
  Administered 2021-02-19: 22:00:00 60 mg via ORAL
  Filled 2021-02-19: qty 3

## 2021-02-19 MED ORDER — DIPHENHYDRAMINE HCL 25 MG PO CAPS
25.0000 mg | ORAL_CAPSULE | Freq: Once | ORAL | Status: AC
  Administered 2021-02-19: 22:00:00 25 mg via ORAL
  Filled 2021-02-19: qty 1

## 2021-02-19 NOTE — Patient Instructions (Signed)
Your next ORENCIA dose is due on 02/26/21, 03/05/21, and every 7 days thereafter  [place] if you have signs or symptoms of an infection. You can resume once you feel better or back to your baseline. HOLD ORENCIA if you start antibiotics to treat an infection. HOLD ORENCIA around the time of surgery/procedures. Your surgeon will be able to provide recommendations on when to hold BEFORE and when you are cleared to [place].  Pharmacy information: Your prescription will be shipped from [facility]. Their phone number is [phone] They will call you in 3 weeks to schedule shipment of med to your house  Labs are due in 1 month then every 3 months. Lab hours are from Monday to Thursday 1:30-4:30pm and Friday 1:30-4pm. You do not need an appointment if you come for labs during these times.  How to manage an injection site reaction: Remember the 5 C's: COUNTER - leave on the counter at least 30 minutes but up to overnight to bring medication to room temperature. This may help prevent stinging COLD - place something cold (like an ice gel pack or cold water bottle) on the injection site just before cleansing with alcohol. This may help reduce pain CLARITIN - use Claritin (generic name is loratadine) for the first two weeks of treatment or the day of, the day before, and the day after injecting. This will help to minimize injection site reactions CORTISONE CREAM - apply if injection site is irritated and itching CALL ME - if injection site reaction is bigger than the size of your fist, looks infected, blisters, or if you develop hives

## 2021-02-19 NOTE — ED Triage Notes (Signed)
Pt was given an arthritis shot around 1000 today to her left thigh. (Orencia) Around 1300 she noticed swelling, warmth, and pain to the site.

## 2021-02-19 NOTE — Progress Notes (Signed)
Pharmacy Note  Subjective:   Patient presents to clinic today to receive first dose of Orencia. She is transitioning from Enbrel. Last dose of Enbrel was 02/09/21 - she held dose that was due on 02/16/21. She states that the Enbrel injection usually burns during administration but does admit to not leaving medication at room temperature due to fear of forgetting to take medication.  Patient running a fever or have signs/symptoms of infection? No  Patient currently on antibiotics for the treatment of infection? No  Patient have any upcoming invasive procedures/surgeries? No  Objective: CMP     Component Value Date/Time   NA 137 02/05/2021 1516   NA 139 06/24/2016 1104   K 4.0 02/05/2021 1516   CL 105 02/05/2021 1516   CO2 24 02/05/2021 1516   GLUCOSE 84 02/05/2021 1516   BUN 15 02/05/2021 1516   BUN 14 06/24/2016 1104   CREATININE 0.78 02/05/2021 1516   CALCIUM 9.5 02/05/2021 1516   PROT 7.1 02/05/2021 1516   PROT 7.3 06/24/2016 1104   ALBUMIN 3.7 08/26/2020 1529   ALBUMIN 3.9 06/24/2016 1104   AST 19 02/05/2021 1516   ALT 30 (H) 02/05/2021 1516   ALKPHOS 46 08/26/2020 1529   BILITOT 0.3 02/05/2021 1516   BILITOT 0.2 06/24/2016 1104   GFRNONAA >60 08/26/2020 1529   GFRNONAA 78 07/03/2020 1414   GFRAA 90 07/03/2020 1414    CBC    Component Value Date/Time   WBC 7.8 02/05/2021 1516   RBC 4.64 02/05/2021 1516   HGB 14.0 02/05/2021 1516   HGB 11.8 06/24/2016 1104   HCT 41.9 02/05/2021 1516   HCT 34.1 06/24/2016 1104   PLT 304 02/05/2021 1516   PLT 342 06/24/2016 1104   MCV 90.3 02/05/2021 1516   MCV 85 06/24/2016 1104   MCH 30.2 02/05/2021 1516   MCHC 33.4 02/05/2021 1516   RDW 13.7 02/05/2021 1516   RDW 15.2 06/24/2016 1104   LYMPHSABS 2,948 02/05/2021 1516   LYMPHSABS 2.0 06/24/2016 1104   MONOABS 0.5 08/26/2020 1529   EOSABS 671 (H) 02/05/2021 1516   EOSABS 0.4 06/24/2016 1104   BASOSABS 47 02/05/2021 1516   BASOSABS 0.0 06/24/2016 1104    Baseline  Immunosuppressant Therapy Labs TB GOLD Quantiferon TB Gold Latest Ref Rng & Units 02/05/2021  Quantiferon TB Gold Plus NEGATIVE NEGATIVE   Hepatitis Panel Hepatitis Latest Ref Rng & Units 03/12/2018  Hep B Surface Ag Negative Negative  Hep C antibody - <0.1 on 03/12/2018 Hep panel negative on 03/29/2015  HIV Lab Results  Component Value Date   HIV Non Reactive 03/12/2018   HIV Non Reactive 06/24/2016   Immunoglobulins  SPEP Serum Protein Electrophoresis Latest Ref Rng & Units 02/05/2021  Total Protein 6.1 - 8.1 g/dL 7.1   Chest x-ray: 01/17/21 - Subtle opacity in the right base could represent developing pneumonia given history. Subtle interstitial/bronchitic prominence consistent with history of bronchitis. Recommend short-term follow-up imaging to ensure resolution.  Assessment/Plan:   Demonstrated proper injection technique with Orencia demo device  Patient able to demonstrate proper injection technique using the teach back method.  Patient self injected in the left thigh with:  [facility]-Supplied Medication: Orencia Clickject 125mg /mL NDC: [address]-37 Lot: [id number] Expiration: 08/2022  Patient tolerated well.  Observed for 30 mins in office for adverse reaction and none noted.   Patient is to return in 1 month for labs and 6-8 weeks for follow-up appointment.  Standing orders placed. Will obtain SPEP and immunoglobulins with next labs.  Orencia approved through insurance .   Rx sent to: [facility]: [phone] .  Patient provided with pharmacy phone number and advised that pharmacy will call in 3 weeks to schedule following month's shipment.Patient provided with three Orencia autoinjectors from [facility]. Advised to place in refrigerator. We reviewed in detail that medication should be left at room temp for at least 30 min prior to administration to prevent burning and stinging and mitigate risk of injection site reaction. Recommended she pair  administration with routine- pull Orencia out before daily task (brushing teeth, eating dinner, etc) and take Orencia after task has been completed  All questions encouraged and answered.  Instructed patient to call with any further questions or concerns.  Knox Saliva, PharmD, MPH, BCPS Clinical Pharmacist (Rheumatology and Pulmonology)  02/19/2021 9:53 AM

## 2021-02-19 NOTE — ED Provider Notes (Signed)
Emergency Medicine Provider Triage Evaluation Note  Elizabeth Pope , a 40 y.o. female  was evaluated in triage.  Pt complains of left thigh redness itching and irritation.  She states that she was given an injection of a new medication around 10 AM this morning around 1 PM she noticed that her leg was quite itchy and swollen and red where she had the injection.  Denies any fevers chills nausea vomiting cough congestion lightheadedness or dizziness.  Does endorse mild circumferential achy headache.  No trauma to head.  No other associated symptoms.  She has not had this medication before.  She denies any difficulty breathing nausea vomiting or diarrhea.  No wheezing  Review of Systems  Positive: Left leg redness Negative: Fever, SOB  Physical Exam  There were no vitals taken for this visit. Gen:   Awake, no distress   Resp:  Normal effort  MSK:   Moves extremities without difficulty  Other:  Area of erythema and warmth tender skin left thigh.  No fluctuance, or cellulitis.  Medical Decision Making  Medically screening exam initiated at 9:57 PM.  Appropriate orders placed.  Ellison Hughs Teanna Elem was informed that the remainder of the evaluation will be completed by another provider, this initial triage assessment does not replace that evaluation, and the importance of remaining in the ED until their evaluation is complete.  Localized reaction from IM medication.  Will give prednisone and Benadryl.  She has taken 25 mg of Benadryl around 7 PM.  We will give her a 25 mg dose here along with prednisone.   Tedd Sias, [place] 02/19/21 [id number]    Godfrey Pick, MD 02/20/21 [phone]

## 2021-02-20 ENCOUNTER — Other Ambulatory Visit (HOSPITAL_COMMUNITY): Payer: Self-pay

## 2021-02-20 ENCOUNTER — Telehealth: Payer: Self-pay | Admitting: *Deleted

## 2021-02-20 NOTE — Progress Notes (Signed)
Office Visit Note  Patient: Elizabeth Pope             Date of Birth: Aug 02, 1980           MRN: [id number]             PCP: Kristie Cowman, MD Referring: Kristie Cowman, MD Visit Date: 02/28/2021 Occupation: @GUAROCC @  Subjective:  Discuss treatment options   History of Present Illness: Elizabeth Pope is a 40 y.o. female with history of seropositive rheumatoid arthritis and osteoarthritis.  She presents today to discuss treatment options.  She had an injection site reaction with orencia after her first injection on 02/19/21.  She was evaluated in the ED on 02/19/21 and was given prednisone and benadryl.  She applied hydrocortisone cream and the reaction slowly resolved.  She does not want to restart on orencia at this time.  She previously tried enbrel and humira.  She remains on methotrexate 0.8 ml sq injections once weekly and folic acid 2 mg daily.  She continues to have migratory arthralgias and myalgias.  She presents today with neck and left shoulder pain.  She has been experiencing muscle spasms intermittently. She did not notice any improvement after having a trigger point injection on 02/05/21.  She denies any shortness of breath or chest pain.  She has not been taking any OTC products for pain relief.  She has tried heat with some alleviation of symptoms.     Activities of Daily Living:  Patient reports morning stiffness for several hours.   Patient Reports nocturnal pain.  Difficulty dressing/grooming: Reports Difficulty climbing stairs: Reports Difficulty getting out of chair: Reports Difficulty using hands for taps, buttons, cutlery, and/or writing: Reports  Review of Systems  Constitutional:  Negative for fatigue.  HENT:  Positive for mouth dryness and nose dryness. Negative for mouth sores.   Eyes:  Positive for pain, itching and dryness. Negative for visual disturbance.  Respiratory:  Negative for cough, hemoptysis, shortness of breath and difficulty  breathing.   Cardiovascular:  Positive for chest pain and palpitations. Negative for hypertension and swelling in legs/feet.  Gastrointestinal:  Negative for blood in stool, constipation and diarrhea.  Endocrine: Negative for increased urination.  Genitourinary:  Negative for difficulty urinating and painful urination.  Musculoskeletal:  Positive for joint pain, joint pain, joint swelling, myalgias, morning stiffness, muscle tenderness and myalgias. Negative for muscle weakness.  Skin:  Negative for color change, pallor, rash, hair loss, nodules/bumps, redness, skin tightness, ulcers and sensitivity to sunlight.  Allergic/Immunologic: Negative for susceptible to infections.  Neurological:  Negative for dizziness, numbness, headaches, memory loss and weakness.  Hematological:  Negative for bruising/bleeding tendency and swollen glands.  Psychiatric/Behavioral:  Positive for sleep disturbance. Negative for depressed mood and confusion. The patient is not nervous/anxious.    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   Pelvic pain 06/02/2017   Atypical squamous cell changes of undetermined significance (ASCUS) on cervical cytology with negative high risk human papilloma virus (HPV) test result 02/03/2017   Encounter for IUD insertion 01/28/2017   Uterine fibroid 01/21/2017   H/O tubal ligation 06/24/2016   High risk medication use 01/07/2016   GERD (gastroesophageal reflux disease) 12/25/2015   Migraines 12/25/2015   Hypertension 12/25/2015   RA (rheumatoid arthritis) ([place]) 12/25/2015   DDD (degenerative disc disease), cervical 12/25/2015   Osteoarthritis of both hands 12/25/2015  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/2015   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   TB lung, latent 05/14/2015   Bipolar 2 disorder ([place]) 05/14/2015   Cigarette smoker 05/05/2015   Chest  pain 05/05/2015   Solitary pulmonary nodule 05/04/2015    Past Medical History:  Diagnosis Date   Anxiety    Bipolar 1 disorder (HCC)    DDD (degenerative disc disease), cervical 12/25/2015   Depression    GERD (gastroesophageal reflux disease) 12/25/2015   Hypertension 12/25/2015   Migraine    Migraines 12/25/2015   OCD (obsessive compulsive disorder)    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/2015   mild   Osteoarthritis of both hands 12/25/2015   PTSD (post-traumatic stress disorder)    RA (rheumatoid arthritis) ([place]) 12/25/2015   Positive RF, Elevated ESR, Positive CCP > 250     Family History  Problem Relation Age of Onset   Rheum arthritis Mother    Migraines Mother    Hypertension Mother    Colitis Mother    Rheum arthritis Maternal Grandmother    Rheum arthritis Maternal Aunt    Migraines Sister    Migraines Brother    Sickle cell anemia Daughter    Bipolar disorder Daughter    Anxiety disorder Daughter    Depression Daughter    Migraines Daughter    Arrhythmia Daughter    Migraines Son    ADD / ADHD Son    Seizures Son    Arrhythmia Son    Past Surgical History:  Procedure Laterality Date   CESAREAN SECTION     TUBAL LIGATION     Social History   Social History Narrative   Not on file   Immunization History  Administered Date(s) Administered   PFIZER(Purple Top)SARS-COV-2 Vaccination 01/05/2020, 01/26/2020     Objective: Vital Signs: BP (!) 141/98 (BP Location: Left Arm, Patient Position: Sitting, Cuff Size: Normal)    Pulse 85    Ht 5' 4"  (1.626 m)    Wt 184 lb 12.8 oz (83.8 kg)    BMI 31.72 kg/m    Physical Exam Vitals and nursing note reviewed.  Constitutional:      Appearance: She is well-developed.  HENT:     Head: Normocephalic and atraumatic.  Eyes:     Conjunctiva/sclera: Conjunctivae normal.  Pulmonary:     Effort: Pulmonary effort is normal.  Abdominal:     Palpations: Abdomen is soft.  Musculoskeletal:     Cervical back: Normal  range of motion.  Skin:    General: Skin is warm and dry.     Capillary Refill: Capillary refill takes less than 2 seconds.     Comments: Rheumatoid nodule palpable on the extensor surface of the right elbow  Neurological:     Mental Status: She is alert and oriented to person, place, and time.  Psychiatric:        Behavior: Behavior normal.     Musculoskeletal Exam: C-spine has painful limited range of motion.  Trapezius muscle spasms on the left side noted.  Painful range of motion of the left shoulder.  Elbow joints, wrist joints, MCPs, PIPs, DIPs have good range of motion with no synovitis.  Some fullness in the left wrist.  Complete fist formation bilaterally.  Hip joints have good range of motion with no discomfort.  Knee joints have good range of motion no warmth or effusion.  Ankle joints have good range of motion with tenderness of the right ankle.  Generalized hyperalgesia and positive tender points  on exam.  CDAI Exam: CDAI Score: 1  Patient Global: 5 mm; Provider Global: 5 mm Swollen: 0 ; Tender: 0  Joint Exam 02/28/2021   No joint exam has been documented for this visit   There is currently no information documented on the homunculus. Go to the Rheumatology activity and complete the homunculus joint exam.  Investigation: No additional findings.  Imaging: [place] COMPLETE JOINT SPACE STRUCTURES UP BILAT  Result Date: 02/07/2021 Ultrasound examination of the bilateral hands was performed per EULAR recommendations. Using 15 MHz transducer, grayscale and power Doppler bilateral second, third, and fifth MCP joints and bilateral wrist joints both dorsal and volar aspects were evaluated to look for synovitis or tenosynovitis. The findings were there was  synovitis over bilateral second third and fifth MCP joints and wrist joints.  Tenosynovitis was noted in the right wrist joint on ultrasound examination. Right median nerve was 0.07 cm squares which was within normal limits and left  median nerve was 0.05 cm squares which was within normal limits Impression: Ultrasound examination showed synovitis in bilateral wrist joints and tenosynovitis in the right wrist joint.  Bilateral median nerves are within normal limits.  XR Lumbar Spine 2-3 Views  Result Date: 02/05/2021 No disc space narrowing or facet joint arthropathy was noted.  No SI joint sclerosis or narrowing was noted. Impression: Unremarkable x-rays of the lumbar spine.  XR Shoulder Left  Result Date: 02/05/2021 No glenohumeral or acromioclavicular joint space narrowing was noted.  No chondrocalcinosis was noted. Impression: Unremarkable x-rays of the shoulder joint.   Recent Labs: Lab Results  Component Value Date   WBC 7.8 02/05/2021   HGB 14.0 02/05/2021   PLT 304 02/05/2021   NA 137 02/05/2021   K 4.0 02/05/2021   CL 105 02/05/2021   CO2 24 02/05/2021   GLUCOSE 84 02/05/2021   BUN 15 02/05/2021   CREATININE 0.78 02/05/2021   BILITOT 0.3 02/05/2021   ALKPHOS 46 08/26/2020   AST 19 02/05/2021   ALT 30 (H) 02/05/2021   PROT 7.1 02/05/2021   ALBUMIN 3.7 08/26/2020   CALCIUM 9.5 02/05/2021   GFRAA 90 07/03/2020   QFTBGOLDPLUS NEGATIVE 02/05/2021    Speciality Comments: PLQ Eye Exam: WNL 03/24/17 @ [facility] Prior therapy: Humira (facial rash)  Procedures:  No procedures performed Allergies: Doxycycline, Fluoxetine, Hydrocodone, Adalimumab, Hydrochlorothiazide, Hydrocodone-acetaminophen, Hydroxychloroquine, Other, and Vicodin [hydrocodone-acetaminophen]   Assessment / Plan:     Visit Diagnoses: Rheumatoid arthritis involving multiple sites with positive rheumatoid factor ([place]): She presents to discuss treatment options.  She initiated Orencia subcutaneous injections on 02/19/2021 but developed injection site reaction requiring evaluation at the ED so she has discontinued therapy.  Previous therapy includes Enbrel, Humira, and Plaquenil.  She remains on methotrexate 0.8 mL sq injections once  weekly and folic acid 2 mg daily.  She had an ultrasound of both hands on 02/07/2021 which revealed synovitis in both wrists and tenosynovitis of the right wrist despite being on Enbrel and methotrexate in combination.  She will require combination therapy going forward.  Different treatment options were discussed today in detail.  She is apprehensive to take a Jak inhibitor.  Indications, contraindications, potential side effects of Simponi were discussed.  All questions were addressed and consent was obtained.  She will return for lab work 1 month and every 3 months after starting on Simponi.  We will apply for Simponi through her insurance and once approved she will return the office for administration of the first injection.  She  remains on methotrexate as prescribed.  A prednisone taper starting at 20 mg tapering by 5 mg every 2 days was sent to the pharmacy. She will follow up in 6 weeks to assess her response.   Medication counseling:   Baseline Immunosuppressant Therapy Labs TB GOLD Quantiferon TB Gold Latest Ref Rng & Units 02/05/2021  Quantiferon TB Gold Plus NEGATIVE NEGATIVE   Hepatitis Panel Hepatitis Latest Ref Rng & Units 03/12/2018  Hep B Surface Ag Negative Negative   HIV Lab Results  Component Value Date   HIV Non Reactive 03/12/2018   HIV Non Reactive 06/24/2016     SPEP Serum Protein Electrophoresis Latest Ref Rng & Units 02/05/2021  Total Protein 6.1 - 8.1 g/dL 7.1   Chest x-ray: 01/17/21   Does patient have diagnosis of heart failure?  No  Counseled patient that Simponi is a TNF blocking agent.  Reviewed Simponi dose of 50 mg once a month.  Counseled patient on purpose, proper use, and adverse effects of Simponi.  Reviewed the most common adverse effects including infections and injection site reactions.  Discussed that there is the possibility of an increased risk of malignancy but it is not well understood if this increased risk is due to the medication or the  disease state.  Advised patient to get yearly dermatology exams due to risk of skin cancer.    Counseled patient that Simponi should be held for infections and prior to scheduled surgery.  Counseled patient to avoid live vaccines while on Simponi.  Advised patient to get annual influenza vaccine and the pneumococcal vaccine as indicated.    Reviewed the importance of regular labs while on Simponi therapy. Standing orders placed. Provided patient with medication education material and answered all questions.  Patient voiced understanding.  Patient consented to Simponi.  Will upload consent into the media tab.  Reviewed storage instructions for Simponi.  Advised initial injection must be administered in office.  Patient verbalized understanding.  Will apply for Simponi through patient's insurance and will update when we receive a response.  Patient dose will be Simponi 50 mg every 28 days.  Prescription pending lab results and/or insurance approval.  High risk medication use - Applying for Simponi sq injections once monthly.  She will remain on methotrexate 0.8 mL sq injections once weekly and folic acid 2 mg daily.  CBC and CMP updated on 02/05/21.  She will return to update lab work 1 month and every 3 months after starting on Simponi.  TB gold negative on 02/05/21.  Previous therapy: Enbrel, Humira (facial rash), plaquenil (diarrhea), and Orencia (injection site reaction-after 1st injection on 02/19/21).  Discussed the importance of holding Simponi if she develops signs or symptoms of infection and to resume once the infection has completely cleared.  Chronic left shoulder pain: X-rays of the left shoulder unremarkable on 02/05/2021.  She had trigger point injection at that time which did not provide any relief.  She continues to have painful range of motion as well as nocturnal pain.  She has not had any chest pain or shortness of breath.  On examination she had painful limited abduction and internal  rotation.  A prednisone taper starting at 20 mg tapering by 5 mg every 2 days was sent to the pharmacy as discussed above.  Pain in both hands: She continues to experience intermittent pain and stiffness in both hands.  She had an ultrasound of both hands on 02/07/2021 which revealed synovitis.  She has some fullness in the  left wrist on examination today.  Redness of both eyes: Resolved   Primary osteoarthritis of both hands: She continues to experience intermittent pain and stiffness in both hands.  She is able to make a complete fist bilaterally.  Discussed the importance of joint protection and muscle strengthening.  Primary osteoarthritis of both knees: She has good range of motion of both knee joints on examination today.  Chondromalacia of both patellae  Primary osteoarthritis of both feet: She presents today with increased pain and stiffness in the right ankle joint.  She had tenderness palpation over the right ankle on examination today.  Trapezius muscle spasm: She is experiencing muscle spasms in the left trapezius muscle.  She had a trigger point injection performed on 02/05/2021 which would not provide any relief.  DDD (degenerative disc disease), cervical: She has painful limited range of motion especially with lateral rotation to the left.  She has been experiencing trapezius muscle spasms intermittently.  Chronic midline low back pain without sciatica: She is not experiencing any increased lower back pain at this time.  Fibromyalgia: She has generalized hyperalgesia and positive tender points on examination.  Other medical conditions are listed as follows:   Solitary pulmonary nodule  Essential hypertension  History of gastroesophageal reflux (GERD)  Bipolar 2 disorder (HCC)  History of migraine  Cigarette smoker  Orders: No orders of the defined types were placed in this encounter.  Meds ordered this encounter  Medications   predniSONE (DELTASONE) 5 MG tablet     Sig: Take 4 tablets by mouth daily x2 days, 3 tablets daily x2 days, 2 tablets daily x2 days, 1 tablet daily x2 days.    Dispense:  20 tablet    Refill:  0     Follow-Up Instructions: Return in about 6 weeks (around 04/11/2021) for Rheumatoid arthritis, Osteoarthritis.   Ofilia Neas, PA-C  Note - This record has been created using Dragon software.  Chart creation errors have been sought, but may not always  have been located. Such creation errors do not reflect on  the standard of medical care.

## 2021-02-20 NOTE — Telephone Encounter (Signed)
Patient states the injection site reaction has improved after taking benadryl and starting the prednisone as it no longer hurts to walk.  Patient states she will see if she can upload by pictures of the injection site reaction. She states she has one from last night and will send one from today.    Patient advised she should not administer another orencia injection. Cancelled prescription at the pharmacy.

## 2021-02-20 NOTE — Telephone Encounter (Signed)
Please clarify if the injection site reaction has improved after taking benadryl and starting the prednisone.   Please also see if she can upload by pictures of the injection site reaction.   Please schedule an office visit next week when [facility] and Dr. Estanislado Pandy are in the office to discuss other treatment options.  She should not administer another orencia injection.  Please see if shipment can be canceled.

## 2021-02-20 NOTE — Telephone Encounter (Signed)
Patient states she was started on Orencia in the office on 02/19/2021. Patient states she is having a reaction in the area of the injection site. Patient states she noticed the reaction around 3-4 yesterday afternoon. Patient states the area was small at first but has increased in size. Patient states there is warmth, redness and itching. Patient states the area is more than the size of a half dollar. Patient states "it is big." Patient states she has put hydrocortisone cream and use Benadryl. Patient denies any shortness of breath or trouble breathing. Patient states she also went to the ER where she was given prednisone and left the emergency room before being evaluated further. Please advise.

## 2021-02-22 ENCOUNTER — Telehealth: Payer: Self-pay | Admitting: *Deleted

## 2021-02-22 NOTE — Telephone Encounter (Signed)
Error

## 2021-02-28 ENCOUNTER — Other Ambulatory Visit: Payer: Self-pay

## 2021-02-28 ENCOUNTER — Encounter: Payer: Self-pay | Admitting: Physician Assistant

## 2021-02-28 ENCOUNTER — Telehealth: Payer: Self-pay | Admitting: Pharmacist

## 2021-02-28 ENCOUNTER — Ambulatory Visit (INDEPENDENT_AMBULATORY_CARE_PROVIDER_SITE_OTHER): Payer: Medicare Other | Admitting: Physician Assistant

## 2021-02-28 VITALS — BP 141/98 | HR 85 | Ht 64.0 in | Wt 184.8 lb

## 2021-02-28 DIAGNOSIS — M2241 Chondromalacia patellae, right knee: Secondary | ICD-10-CM

## 2021-02-28 DIAGNOSIS — M19071 Primary osteoarthritis, right ankle and foot: Secondary | ICD-10-CM

## 2021-02-28 DIAGNOSIS — M797 Fibromyalgia: Secondary | ICD-10-CM

## 2021-02-28 DIAGNOSIS — I10 Essential (primary) hypertension: Secondary | ICD-10-CM

## 2021-02-28 DIAGNOSIS — M170 Bilateral primary osteoarthritis of knee: Secondary | ICD-10-CM

## 2021-02-28 DIAGNOSIS — M0579 Rheumatoid arthritis with rheumatoid factor of multiple sites without organ or systems involvement: Secondary | ICD-10-CM | POA: Diagnosis not present

## 2021-02-28 DIAGNOSIS — G8929 Other chronic pain: Secondary | ICD-10-CM

## 2021-02-28 DIAGNOSIS — M62838 Other muscle spasm: Secondary | ICD-10-CM

## 2021-02-28 DIAGNOSIS — M5030 Other cervical disc degeneration, unspecified cervical region: Secondary | ICD-10-CM

## 2021-02-28 DIAGNOSIS — M79641 Pain in right hand: Secondary | ICD-10-CM | POA: Diagnosis not present

## 2021-02-28 DIAGNOSIS — M79642 Pain in left hand: Secondary | ICD-10-CM

## 2021-02-28 DIAGNOSIS — M19041 Primary osteoarthritis, right hand: Secondary | ICD-10-CM

## 2021-02-28 DIAGNOSIS — M19072 Primary osteoarthritis, left ankle and foot: Secondary | ICD-10-CM

## 2021-02-28 DIAGNOSIS — Z79899 Other long term (current) drug therapy: Secondary | ICD-10-CM | POA: Diagnosis not present

## 2021-02-28 DIAGNOSIS — M19042 Primary osteoarthritis, left hand: Secondary | ICD-10-CM

## 2021-02-28 DIAGNOSIS — R911 Solitary pulmonary nodule: Secondary | ICD-10-CM

## 2021-02-28 DIAGNOSIS — M545 Low back pain, unspecified: Secondary | ICD-10-CM

## 2021-02-28 DIAGNOSIS — M2242 Chondromalacia patellae, left knee: Secondary | ICD-10-CM

## 2021-02-28 DIAGNOSIS — M25512 Pain in left shoulder: Secondary | ICD-10-CM | POA: Diagnosis not present

## 2021-02-28 DIAGNOSIS — F3181 Bipolar II disorder: Secondary | ICD-10-CM

## 2021-02-28 DIAGNOSIS — H5789 Other specified disorders of eye and adnexa: Secondary | ICD-10-CM

## 2021-02-28 DIAGNOSIS — Z8719 Personal history of other diseases of the digestive system: Secondary | ICD-10-CM

## 2021-02-28 DIAGNOSIS — F17210 Nicotine dependence, cigarettes, uncomplicated: Secondary | ICD-10-CM

## 2021-02-28 DIAGNOSIS — Z8669 Personal history of other diseases of the nervous system and sense organs: Secondary | ICD-10-CM

## 2021-02-28 MED ORDER — PREDNISONE 5 MG PO TABS: ORAL_TABLET | ORAL | 0 refills | Status: DC

## 2021-02-28 NOTE — Patient Instructions (Signed)
Standing Labs We placed an order today for your standing lab work.   Please have your standing labs drawn in 1 month then every 3 months   If possible, please have your labs drawn 2 weeks prior to your appointment so that the provider can discuss your results at your appointment.  Please note that you may see your imaging and lab results in [place] before we have reviewed them. We may be awaiting multiple results to interpret others before contacting you. Please allow our office up to 72 hours to thoroughly review all of the results before contacting the office for clarification of your results.  We have open lab daily: Monday through Thursday from 1:30-4:30 PM and Friday from 1:30-4:00 PM at the office of Dr. Bo Merino, [facility].   Please be advised, all patients with office appointments requiring lab work will take precedent over walk-in lab work.  If possible, please come for your lab work on Monday and Friday afternoons, as you may experience shorter wait times. The office is located at [address] [place], [place], [place] [address] No appointment is necessary.   Labs are drawn by [facility]. Please bring your co-pay at the time of your lab draw.  You may receive a bill from [place] for your lab work.  If you wish to have your labs drawn at another location, please call the office 24 hours in advance to send orders.  If you have any questions regarding directions or hours of operation,  please call [phone].   As a reminder, please drink plenty of water prior to coming for your lab work. Thanks!   Golimumab injection (subcutaneous or intravenous use) What is this medication? GOLIMUMAB (goe LIM ue mab) is used to treat rheumatoid arthritis, psoriatic arthritis, polyarticular juvenile idiopathic arthritis, and ankylosing spondylitis. It is also used to treat ulcerative colitis. This medicine may be used for other purposes; ask your health care provider or  pharmacist if you have questions. COMMON BRAND NAME(S): Simponi, SIMPONI ARIA What should I tell my care team before I take this medication? They need to know if you have any of these conditions: cancer diabetes Guillain-Barre syndrome heart failure hepatitis B or history of hepatitis B infection immune system problems infection or history of infections low blood counts like low white cell, platelet, or red cell counts multiple sclerosis psoriasis recently received or scheduled to receive a vaccine tuberculosis, a positive skin test for tuberculosis or have recently been in close contact with someone who has tuberculosis an unusual reaction to golimumab, other medicines, latex, rubber, foods, dyes, or preservatives pregnant or trying to get pregnant breast-feeding How should I use this medication? This medicine is for infusion into a vein or for injection under the skin. Infusions are given by a health care professional in a hospital or clinic setting. If you are to give your own medicine at home, you will be taught how to prepare and give this medicine under the skin. Use exactly as directed. Take your medicine at regular intervals. Do not take your medicine more often than directed. It is important that you put your used injectors, needles and syringes in a special sharps container. Do not put them in a trash can. If you do not have a sharps container, call your pharmacist or healthcare provider to get one. A special MedGuide will be given to you by the pharmacist with each prescription and refill. Be sure to read this information carefully each time. Talk to your  pediatrician regarding the use of this medicine in children. While this drug may be prescribed for children as young as 2 years for selected conditions, precautions do apply. Overdosage: If you think you have taken too much of this medicine contact a poison control center or emergency room at once. NOTE: This medicine is only for  you. Do not share this medicine with others. What if I miss a dose? If you give your medicine by injection under the skin: If you miss a dose, take it as soon as you can. If it is almost time for your next dose, take only that dose. Do not take double or extra doses. Call your doctor or health care professional if you are not sure how to handle a missed dose. If you are to be given an infusion: It is important not to miss your dose. Call your doctor or health care professional if you are unable to keep an appointment. What may interact with this medication? Do not take this medicine with any of the following medications: abatacept adalimumab anakinra certolizumab etanercept infliximab live virus vaccines rituximab rilonacept tocilizumab This medicine may also interact with the following medications: cyclosporine theophylline vaccines warfarin This list may not describe all possible interactions. Give your health care provider a list of all the medicines, herbs, non-prescription drugs, or dietary supplements you use. Also tell them if you smoke, drink alcohol, or use illegal drugs. Some items may interact with your medicine. What should I watch for while using this medication? Visit your doctor or health care professional for regular checks on your progress. Tell your doctor or healthcare professional if your symptoms do not start to get better or if they get worse. You will be tested for tuberculosis (TB) before you start this medicine. If your doctor prescribes any medicine for TB, you should start taking the TB medicine before starting this medicine. Make sure to finish the full course of TB medicine. Call your doctor or health care professional if you get a cold or other infection while receiving this medicine. Do not treat yourself. This medicine may decrease your body's ability to fight infection. Talk to your doctor about your risk of cancer. You may be more at risk for certain types  of cancers if you take this medicine. What side effects may I notice from receiving this medication? Side effects that you should report to your doctor or health care professional as soon as possible: allergic reactions like skin rash, itching or hives, swelling of the face, lips, or tongue breathing problems changes in vision chest pain joint or muscle pain mouth sores numbness or tingling in any part of your body red, scaly patches or raised bumps on the skin signs and symptoms of infection like fever or chills; cough; sore throat; pain or trouble passing urine signs and symptoms of liver injury like dark yellow or brown urine; general ill feeling or flu-like symptoms; light-colored stools; loss of appetite; nausea; right upper belly pain; unusually weak or tired; yellowing of the eyes or skin swelling of the legs or ankles swollen lymph nodes in the neck, underarm, or groin areas unexplained weight loss unusual bleeding or bruising unusually weak or tired Side effects that usually do not require medical attention (report to your doctor or health care professional if they continue or are bothersome): dizziness increased blood pressure loss of appetite nausea, vomiting redness, itching, swelling, or bruising at site where injected runny nose This list may not describe all possible side effects. Call  your doctor for medical advice about side effects. You may report side effects to FDA at 1-800-FDA-1088. Where should I keep my medication? Infusions will be given in a hospital or clinic and will not be stored at home. Storage for syringes or injectors given under the skin and stored at home: Keep out of the reach of children. Store in a refrigerator between 2 and 8 degrees C (36 and 46 degrees F). If needed, the syringes or injectors may be stored at room temperature up to 25 degrees C (77 degrees F) for up to 30 days. Do not put the medicine back in the refrigerator after it reaches room  temperature. Keep this medicine in the original container. Protect from light. Do not freeze. Do not shake. Throw away any medicine that has been kept at room temperature for 30 days and has not been used. Throw away any unused medicine after the expiration date. NOTE: This sheet is a summary. It may not cover all possible information. If you have questions about this medicine, talk to your doctor, pharmacist, or health care provider.  2022 Elsevier/Gold Standard (2020-10-30 00:00:00)    If you have signs or symptoms of an infection or start antibiotics: First, call your PCP for workup of your infection. Hold your medication through the infection, until you complete your antibiotics, and until symptoms resolve if you take the following: Injectable medication (Actemra, Benlysta, Cimzia, Cosentyx, Enbrel, Humira, Kevzara, Orencia, Remicade, Simponi, Stelara, Taltz, Tremfya) Methotrexate Leflunomide (Arava) Mycophenolate (Cellcept) Morrie Sheldon, Olumiant, or Rinvoq

## 2021-02-28 NOTE — Telephone Encounter (Signed)
Please start Simponi SQ autoinjector BIV.  Dose: 50mg  every 30 days  Dx: Rheumatoid arthritis (M05.9)  Previously tried therapies: Enbrel - waning clinical response Humira - facial rash Orencia - injection site reaction, patient had ED Visit for management  Therapies patient unable to try: JAK inhibitors (Rinvoq, Roma Kayser) - current smoker  Currently taking methotrexate  Knox Saliva, PharmD, MPH, BCPS Clinical Pharmacist (Rheumatology and Pulmonology)

## 2021-03-01 NOTE — Telephone Encounter (Signed)
Submitted a Prior Authorization request to [facility] for [place] SQ via CoverMyMeds. Will update once we receive a response.  Key: [id number]  Knox Saliva, PharmD, MPH, BCPS Clinical Pharmacist (Rheumatology and Pulmonology)

## 2021-03-04 ENCOUNTER — Other Ambulatory Visit (HOSPITAL_COMMUNITY): Payer: Self-pay

## 2021-03-04 MED ORDER — SIMPONI 50 MG/0.5ML ~~LOC~~ SOAJ
50.0000 mg | SUBCUTANEOUS | 0 refills | Status: DC
  Filled 2021-03-04: qty 0.5, 30d supply, fill #0

## 2021-03-04 NOTE — Telephone Encounter (Signed)
Delivery instructions have been updated in [place], medication will be couriered to [facility] by 03/05/21.  Rx has been processed in [facility] and the patient has no copay at this time.

## 2021-03-04 NOTE — Telephone Encounter (Signed)
Received notification from [facility] regarding a prior authorization for [facility]. Authorization has been APPROVED from 03/01/21 to 77/6/23.   Per test claim, patient has no copay for 30 day supply  Patient can fill through [place]: [phone] .   Authorization # [id number] Phone # [phone]  Knox Saliva, PharmD, MPH, BCPS Clinical Pharmacist (Rheumatology and Pulmonology)

## 2021-03-05 ENCOUNTER — Other Ambulatory Visit (HOSPITAL_COMMUNITY): Payer: Self-pay

## 2021-03-06 ENCOUNTER — Ambulatory Visit: Payer: Medicare Other | Admitting: Pharmacist

## 2021-03-06 ENCOUNTER — Other Ambulatory Visit: Payer: Self-pay

## 2021-03-06 ENCOUNTER — Other Ambulatory Visit (HOSPITAL_COMMUNITY): Payer: Self-pay

## 2021-03-06 VITALS — BP 143/94 | HR 71

## 2021-03-06 DIAGNOSIS — Z7189 Other specified counseling: Secondary | ICD-10-CM

## 2021-03-06 DIAGNOSIS — Z79899 Other long term (current) drug therapy: Secondary | ICD-10-CM

## 2021-03-06 DIAGNOSIS — M0579 Rheumatoid arthritis with rheumatoid factor of multiple sites without organ or systems involvement: Secondary | ICD-10-CM

## 2021-03-06 DIAGNOSIS — Z5181 Encounter for therapeutic drug level monitoring: Secondary | ICD-10-CM

## 2021-03-06 MED ORDER — SIMPONI 50 MG/0.5ML ~~LOC~~ SOAJ
50.0000 mg | SUBCUTANEOUS | 0 refills | Status: DC
  Filled 2021-03-06: qty 1.5, 90d supply, fill #0
  Filled 2021-05-07: qty 0.5, 30d supply, fill #0
  Filled 2021-06-05: qty 0.5, 30d supply, fill #1
  Filled 2021-06-28: qty 0.5, 30d supply, fill #2

## 2021-03-06 NOTE — Patient Instructions (Addendum)
Your next [place] dose is due on 04/03/21, 05/01/21, and every 4 weeks thereafter  CONTINUE METHOTREXATE as prescribed  HOLD SIMPONI if you have signs or symptoms of an infection. You can resume once you feel better or back to your baseline. HOLD SIMPONI if you start antibiotics to treat an infection. HOLD SIMPONI around the time of surgery/procedures. Your surgeon will be able to provide recommendations on when to hold BEFORE and when you are cleared to [place].  Pharmacy information: Your prescription will be shipped from [facility]. Their phone number is [phone] They will call you in about 3 weeks to schedule your next fill  Labs are due in 1 month then every 3 months. Lab hours are from Monday to Thursday 1:30-4:30pm and Friday 1:30-4pm. You do not need an appointment if you come for labs during these times.  How to manage an injection site reaction: Remember the 5 C's: COUNTER - leave on the counter at least 30 minutes but up to overnight to bring medication to room temperature. This may help prevent stinging COLD - place something cold (like an ice gel pack or cold water bottle) on the injection site just before cleansing with alcohol. This may help reduce pain CLARITIN - use Claritin (generic name is loratadine) for the first two weeks of treatment or the day of, the day before, and the day after injecting. This will help to minimize injection site reactions CORTISONE CREAM - apply if injection site is irritated and itching CALL ME - if injection site reaction is bigger than the size of your fist, looks infected, blisters, or if you develop hives

## 2021-03-06 NOTE — Progress Notes (Signed)
Pharmacy Note  Subjective:   Patient presents to clinic today to receive first dose of Simponi Smartject. She started Orencia on 02/19/21 and had injection site reaction about 3-4 hours after. She visited ED and provided with prednisone. States OTC hydrocortisone  cream did help with itching. She did not try icing the area. She did not receive any further doses.  Patient running a fever or have signs/symptoms of infection? No  Patient currently on antibiotics for the treatment of infection? No  Patient have any upcoming invasive procedures/surgeries? No  Objective: CMP     Component Value Date/Time   NA 137 02/05/2021 1516   NA 139 06/24/2016 1104   K 4.0 02/05/2021 1516   CL 105 02/05/2021 1516   CO2 24 02/05/2021 1516   GLUCOSE 84 02/05/2021 1516   BUN 15 02/05/2021 1516   BUN 14 06/24/2016 1104   CREATININE 0.78 02/05/2021 1516   CALCIUM 9.5 02/05/2021 1516   PROT 7.1 02/05/2021 1516   PROT 7.3 06/24/2016 1104   ALBUMIN 3.7 08/26/2020 1529   ALBUMIN 3.9 06/24/2016 1104   AST 19 02/05/2021 1516   ALT 30 (H) 02/05/2021 1516   ALKPHOS 46 08/26/2020 1529   BILITOT 0.3 02/05/2021 1516   BILITOT 0.2 06/24/2016 1104   GFRNONAA >60 08/26/2020 1529   GFRNONAA 78 07/03/2020 1414   GFRAA 90 07/03/2020 1414    CBC    Component Value Date/Time   WBC 7.8 02/05/2021 1516   RBC 4.64 02/05/2021 1516   HGB 14.0 02/05/2021 1516   HGB 11.8 06/24/2016 1104   HCT 41.9 02/05/2021 1516   HCT 34.1 06/24/2016 1104   PLT 304 02/05/2021 1516   PLT 342 06/24/2016 1104   MCV 90.3 02/05/2021 1516   MCV 85 06/24/2016 1104   MCH 30.2 02/05/2021 1516   MCHC 33.4 02/05/2021 1516   RDW 13.7 02/05/2021 1516   RDW 15.2 06/24/2016 1104   LYMPHSABS 2,948 02/05/2021 1516   LYMPHSABS 2.0 06/24/2016 1104   MONOABS 0.5 08/26/2020 1529   EOSABS 671 (H) 02/05/2021 1516   EOSABS 0.4 06/24/2016 1104   BASOSABS 47 02/05/2021 1516   BASOSABS 0.0 06/24/2016 1104    Baseline Immunosuppressant Therapy  Labs TB GOLD Quantiferon TB Gold Latest Ref Rng & Units 02/05/2021  Quantiferon TB Gold Plus NEGATIVE NEGATIVE   Hepatitis Panel Hepatitis Latest Ref Rng & Units 03/12/2018  Hep B Surface Ag Negative Negative   HIV Lab Results  Component Value Date   HIV Non Reactive 03/12/2018   HIV Non Reactive 06/24/2016   Immunoglobulins   SPEP Serum Protein Electrophoresis Latest Ref Rng & Units 02/05/2021  Total Protein 6.1 - 8.1 g/dL 7.1    Chest x-ray: 01/17/21 - Subtle opacity in the right base could represent developing pneumonia given history. Subtle interstitial/bronchitic prominence consistent with history of bronchitis. Recommend short-term follow-up imaging to ensure resolution.    Assessment/Plan:  Demonstrated proper injection technique with Simponi demo device  Patient able to demonstrate proper injection technique using the teach back method.  Patient self injected in the right thigh with:  [facility]-Supplied Medication: Simponi 50mg /0.5 mL autoinjector NDC: [address]-51 Lot: [id number] Expiration: 02/2023  She injected Orencia on 02/19/21 in left thigh therefore we chose to avoid same leg today.  Patient tolerated well.  Observed for 30 mins in office for adverse reaction and none noted.   Patient is to return in 1 month for labs and 6-8 weeks for follow-up appointment.  Standing orders placed.  Reviewed injection  site management in detail today.  Simponi approved through insurance .   Rx sent to: [facility]: [phone] . Patient aware that pharmacy will reach out in 3 weeks to schedule next shipment.  She will continue Simponi 50mg  SQ every 4 weeks in combination with methotrexate 20mg  SQ weekly with folic acid 2mg  daily.  All questions encouraged and answered.  Instructed patient to call with any further questions or concerns.  Knox Saliva, PharmD, MPH, BCPS Clinical Pharmacist (Rheumatology and Pulmonology)  03/06/2021 10:27 AM

## 2021-03-23 ENCOUNTER — Other Ambulatory Visit: Payer: Self-pay | Admitting: Physician Assistant

## 2021-03-24 ENCOUNTER — Emergency Department (HOSPITAL_COMMUNITY): Payer: Medicare Other

## 2021-03-24 ENCOUNTER — Emergency Department (HOSPITAL_COMMUNITY)
Admission: EM | Admit: 2021-03-24 | Discharge: 2021-03-24 | Disposition: A | Discharge status: Home / Self Care | Payer: Medicare Other | Attending: Emergency Medicine | Admitting: Emergency Medicine

## 2021-03-24 ENCOUNTER — Encounter (HOSPITAL_COMMUNITY): Payer: Self-pay

## 2021-03-24 ENCOUNTER — Other Ambulatory Visit: Payer: Self-pay

## 2021-03-24 DIAGNOSIS — J45909 Unspecified asthma, uncomplicated: Secondary | ICD-10-CM | POA: Diagnosis not present

## 2021-03-24 DIAGNOSIS — R197 Diarrhea, unspecified: Secondary | ICD-10-CM | POA: Diagnosis not present

## 2021-03-24 DIAGNOSIS — Z79899 Other long term (current) drug therapy: Secondary | ICD-10-CM | POA: Insufficient documentation

## 2021-03-24 DIAGNOSIS — R000 Tachycardia, unspecified: Secondary | ICD-10-CM | POA: Insufficient documentation

## 2021-03-24 DIAGNOSIS — M791 Myalgia, unspecified site: Secondary | ICD-10-CM | POA: Diagnosis present

## 2021-03-24 DIAGNOSIS — Z20822 Contact with and (suspected) exposure to covid-19: Secondary | ICD-10-CM | POA: Insufficient documentation

## 2021-03-24 DIAGNOSIS — R5383 Other fatigue: Secondary | ICD-10-CM | POA: Insufficient documentation

## 2021-03-24 DIAGNOSIS — R6889 Other general symptoms and signs: Secondary | ICD-10-CM

## 2021-03-24 LAB — CBC WITH DIFFERENTIAL/PLATELET
Abs Immature Granulocytes: 0.04 10*3/uL (ref 0.00–0.07)
Basophils Absolute: 0 10*3/uL (ref 0.0–0.1)
Basophils Relative: 0 %
Eosinophils Absolute: 0.2 10*3/uL (ref 0.0–0.5)
Eosinophils Relative: 2 %
HCT: 40.2 % (ref 36.0–46.0)
Hemoglobin: 14.5 g/dL (ref 12.0–15.0)
Immature Granulocytes: 0 %
Lymphocytes Relative: 8 %
Lymphs Abs: 0.8 10*3/uL (ref 0.7–4.0)
MCH: 31.3 pg (ref 26.0–34.0)
MCHC: 36.1 g/dL — ABNORMAL HIGH (ref 30.0–36.0)
MCV: 86.6 fL (ref 80.0–100.0)
Monocytes Absolute: 0.5 10*3/uL (ref 0.1–1.0)
Monocytes Relative: 5 %
Neutro Abs: 8.7 10*3/uL — ABNORMAL HIGH (ref 1.7–7.7)
Neutrophils Relative %: 85 %
Platelets: 285 10*3/uL (ref 150–400)
RBC: 4.64 MIL/uL (ref 3.87–5.11)
RDW: 13.2 % (ref 11.5–15.5)
WBC: 10.3 10*3/uL (ref 4.0–10.5)
nRBC: 0 % (ref 0.0–0.2)

## 2021-03-24 LAB — COMPREHENSIVE METABOLIC PANEL
ALT: 24 U/L (ref 0–44)
AST: 18 U/L (ref 15–41)
Albumin: 3.6 g/dL (ref 3.5–5.0)
Alkaline Phosphatase: 43 U/L (ref 38–126)
Anion gap: 10 (ref 5–15)
BUN: 14 mg/dL (ref 6–20)
CO2: 20 mmol/L — ABNORMAL LOW (ref 22–32)
Calcium: 9.1 mg/dL (ref 8.9–10.3)
Chloride: 106 mmol/L (ref 98–111)
Creatinine, Ser: 0.74 mg/dL (ref 0.44–1.00)
GFR, Estimated: >60 mL/min (ref >60)
Glucose, Bld: 119 mg/dL — ABNORMAL HIGH (ref 70–99)
Potassium: 3.7 mmol/L (ref 3.5–5.1)
Sodium: 136 mmol/L (ref 135–145)
Total Bilirubin: 0.6 mg/dL (ref 0.3–1.2)
Total Protein: 7.6 g/dL (ref 6.5–8.1)

## 2021-03-24 LAB — RESP PANEL BY RT-PCR (FLU A&B, COVID) ARPGX2
Influenza A by PCR: NEGATIVE
Influenza B by PCR: NEGATIVE
SARS Coronavirus 2 by RT PCR: NEGATIVE

## 2021-03-24 IMAGING — DX DG CHEST 1V PORT
1 series · 1 of 1 positions shown · non-contrast
Comparison: [DATE]

CLINICAL DATA: Cough and shortness of breath for several weeks.

EXAM:
PORTABLE CHEST 1 VIEW

[chest ap]
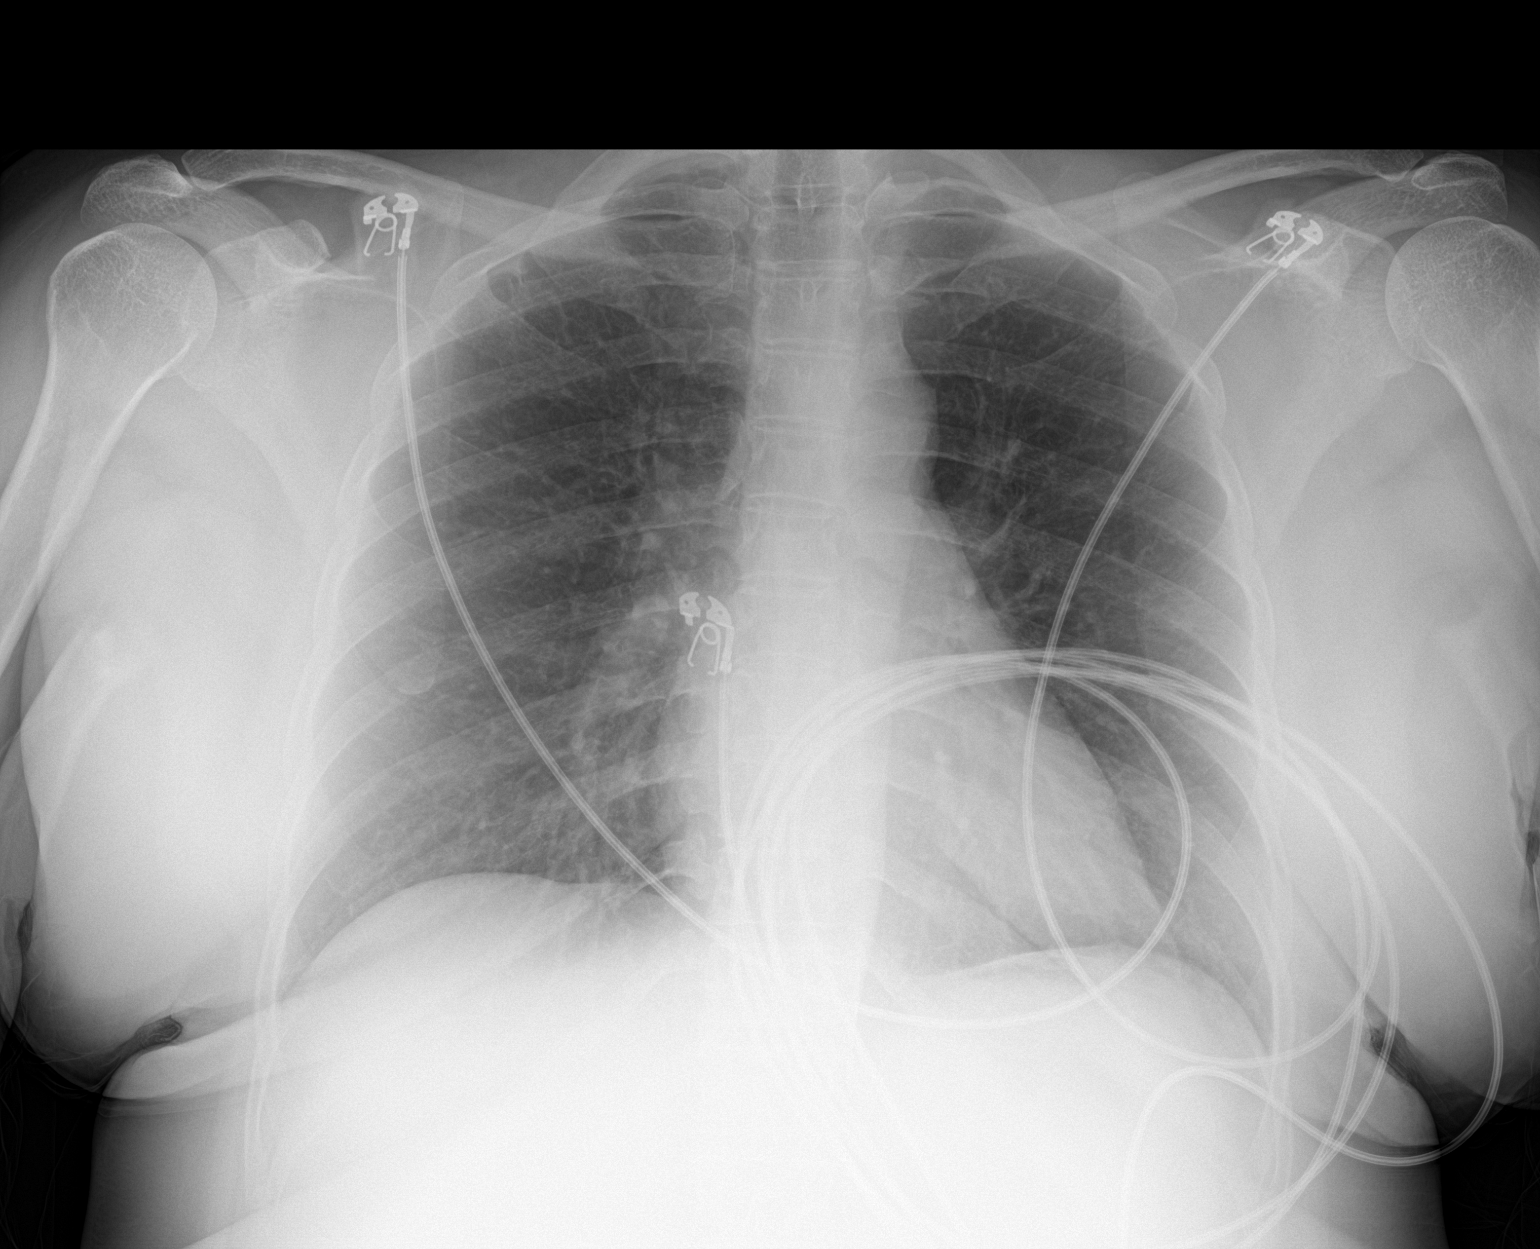

[1 of 1 positions shown; findings below may reference images not displayed]

FINDINGS: The heart size and mediastinal contours are within normal limits.
Both lungs are clear. The visualized skeletal structures are
unremarkable.
IMPRESSION: No active disease.

## 2021-03-24 MED ORDER — METHYLPREDNISOLONE 4 MG PO TBPK: ORAL_TABLET | ORAL | 0 refills | Status: DC

## 2021-03-24 MED ORDER — KETOROLAC TROMETHAMINE 15 MG/ML IJ SOLN
15.0000 mg | Freq: Once | INTRAMUSCULAR | Status: AC
  Administered 2021-03-24: 15 mg via INTRAVENOUS
  Filled 2021-03-24: qty 1

## 2021-03-24 MED ORDER — PREDNISONE 20 MG PO TABS
40.0000 mg | ORAL_TABLET | Freq: Once | ORAL | Status: AC
  Administered 2021-03-24: 40 mg via ORAL
  Filled 2021-03-24: qty 2

## 2021-03-24 MED ORDER — IPRATROPIUM-ALBUTEROL 0.5-2.5 (3) MG/3ML IN SOLN
3.0000 mL | Freq: Once | RESPIRATORY_TRACT | Status: AC
  Administered 2021-03-24: 3 mL via RESPIRATORY_TRACT
  Filled 2021-03-24: qty 3

## 2021-03-24 MED ORDER — SODIUM CHLORIDE 0.9 % IV BOLUS
1000.0000 mL | Freq: Once | INTRAVENOUS | Status: AC
  Administered 2021-03-24: 1000 mL via INTRAVENOUS

## 2021-03-24 NOTE — Discharge Instructions (Addendum)
Return for any problem.  ?

## 2021-03-24 NOTE — ED Notes (Signed)
Patient reports intermittent periods of SOB for several weeks esp at night which are not resolved with their inhaler.

## 2021-03-24 NOTE — ED Provider Notes (Signed)
[facility] DEPT Provider Note   CSN: [id number] Arrival date & time: 03/24/21  1320     History  Chief Complaint  Patient presents with   Generalized Body Aches   Diarrhea    Elizabeth Pope is a 41 y.o. female.  41 year old female with prior medical history as detailed below presents for evaluation.  Patient reports close contact with flu positive family member.  She complains of diffuse body ache that began today.  She complains of feeling wheezy in her lungs.  She denies fever.  She reports that onset of symptoms was approximately 24 hours prior.  The history is provided by the patient and medical records.  Illness Location:  Body aches, wheezing, malaise, fatigue Severity:  Mild Onset quality:  Gradual Duration:  1 day Timing:  Constant Progression:  Waxing and waning Chronicity:  New     Home Medications Prior to Admission medications   Medication Sig Start Date End Date Taking? Authorizing Provider  methylPREDNISolone (MEDROL DOSEPAK) 4 MG TBPK tablet Medrol Dosepack - take Medrol taper as instructed 03/24/21  Yes Derek Huneycutt, Wallis Bamberg, MD  acetaminophen (TYLENOL) 325 MG tablet Take 650 mg by mouth every 6 (six) hours as needed for moderate pain. Patient not taking: Reported on 02/28/2021    [provider]  albuterol (VENTOLIN HFA) 108 (90 Base) MCG/ACT inhaler Inhale 1-2 puffs into the lungs every 6 (six) hours as needed for wheezing or shortness of breath. 12/11/18   Augusto Gamble B, NP  amLODipine (NORVASC) 10 MG tablet Take 10 mg by mouth daily. 01/14/21   [provider]  atorvastatin (LIPITOR) 40 MG tablet Take 40 mg by mouth daily. 06/04/20   [provider]  B-D TB SYRINGE 1CC/27GX1/2" 27G X 1/2" 1 ML MISC Use as directed with MTX weekly 11/05/20   Ofilia Neas, PA-C  cetirizine (ZYRTEC) 10 MG tablet Take 10 mg by mouth daily. 06/12/16   [provider]  diphenhydrAMINE (BENADRYL) 25 MG  tablet Take 25 mg by mouth every 6 (six) hours as needed for itching.    [provider]  EPINEPHrine 0.3 mg/0.3 mL IJ SOAJ injection Inject 0.3 mg into the muscle once as needed for anaphylaxis. 10/27/19   [provider]  ergocalciferol (VITAMIN D2) 1.25 MG (50000 UT) capsule Take 50,000 Units by mouth once a week.    [provider]  esomeprazole (NEXIUM) 20 MG capsule 20 mg daily.    [provider]  Fluticasone-Salmeterol (ADVAIR) 100-50 MCG/DOSE AEPB Inhale 1 puff into the lungs 2 (two) times daily.    [provider]  folic acid (FOLVITE) 1 MG tablet TAKE 2 TABLETS BY MOUTH EVERY DAY Patient taking differently: Take 1 mg by mouth daily. 07/16/20   Bo Merino, MD  Golimumab (SIMPONI) 50 MG/0.5ML SOAJ Inject 50 mg into the skin every 30 (thirty) days. 03/06/21   Ofilia Neas, PA-C  hydrOXYzine (ATARAX/VISTARIL) 25 MG tablet Take 25 mg by mouth 3 (three) times daily as needed for anxiety or itching.    [provider]  lamoTRIgine (LAMICTAL) 100 MG tablet Take 100 mg daily by mouth. 03/18/16   [provider]  lidocaine (LIDODERM) 5 % Place 1 patch onto the skin daily. Remove & Discard patch within 12 hours or as directed by MD Patient not taking: Reported on 02/07/2021 01/17/21   Couture, Cortni S, PA-C  losartan (COZAAR) 50 MG tablet Take 50 mg by mouth daily. 02/21/20   [provider]  methotrexate 50 MG/2ML injection ADMINISTER 0.8 ML(20 MG) UNDER THE SKIN 1 TIME A WEEK Patient not taking: Reported on 02/05/2021 05/22/20   Ofilia Neas, PA-C  Methotrexate Sodium (METHOTREXATE, PF,) 50 MG/2ML injection Inject 0.51ml under the skin once a week 01/28/21   Ofilia Neas, PA-C  montelukast (SINGULAIR) 10 MG tablet Take 10 mg by mouth daily.    [provider]  predniSONE (DELTASONE) 5 MG tablet Take 4 tablets by mouth daily x2 days, 3 tablets daily x2 days, 2 tablets daily x2 days, 1 tablet daily x2 days. 02/28/21    Ofilia Neas, PA-C  RESTASIS 0.05 % ophthalmic emulsion Place 1 drop into both eyes 2 (two) times daily. Patient not taking: Reported on 02/28/2021 09/03/20   [provider]  sertraline (ZOLOFT) 100 MG tablet Take 100 mg daily by mouth. 12/16/16   [provider]  tiZANidine (ZANAFLEX) 4 MG tablet Take 1 tablet (4 mg total) by mouth at bedtime. Patient not taking: Reported on 10/03/2020 06/09/20   Jaynee Eagles, PA-C  Abatacept [facility] CLICKJECT) 595 MG/ML SOAJ Inject 125 mg into the skin once a week. 02/19/21 02/20/21  Bo Merino, MD      Allergies    Doxycycline, Fluoxetine, Hydrocodone, Adalimumab, Hydrochlorothiazide, Hydrocodone-acetaminophen, Hydroxychloroquine, Other, and Vicodin [hydrocodone-acetaminophen]    Review of Systems   Review of Systems  All other systems reviewed and are negative.  Physical Exam Updated Vital Signs BP (!) 138/91    Pulse (!) 102    Temp 98.6 F (37 C) (Oral)    Resp (!) 26    Ht 5\' 4"  (1.626 m)    Wt 82.1 kg    SpO2 96%    BMI 31.07 kg/m  Physical Exam Vitals and nursing note reviewed.  Constitutional:      General: She is not in acute distress.    Appearance: Normal appearance. She is well-developed.  HENT:     Head: Normocephalic and atraumatic.  Eyes:     Conjunctiva/sclera: Conjunctivae normal.     Pupils: Pupils are equal, round, and reactive to light.  Cardiovascular:     Rate and Rhythm: Normal rate and regular rhythm.     Heart sounds: Normal heart sounds.  Pulmonary:     Effort: Pulmonary effort is normal. No respiratory distress.     Comments: Scant diffuse expiratory wheezes in all lung fields Abdominal:     General: There is no distension.     Palpations: Abdomen is soft.     Tenderness: There is no abdominal tenderness.  Musculoskeletal:        General: No deformity. Normal range of motion.     Cervical back: Normal range of motion and neck supple.  Skin:    General: Skin is warm and dry.   Neurological:     General: No focal deficit present.     Mental Status: She is alert and oriented to person, place, and time. Mental status is at baseline.    ED Results / Procedures / Treatments   Labs (all labs ordered are listed, but only abnormal results are displayed) Labs Reviewed  COMPREHENSIVE METABOLIC PANEL - Abnormal; Notable for the following components:      Result Value   CO2 20 (*)    Glucose, Bld 119 (*)    All other components within normal limits  CBC WITH DIFFERENTIAL/PLATELET - Abnormal; Notable for the following components:   MCHC 36.1 (*)    Neutro Abs 8.7 (*)  All other components within normal limits  RESP PANEL BY RT-PCR (FLU A&B, COVID) [id number]    EKG EKG Interpretation  Date/Time:  Sunday March 24 2021 14:55:11 EST Ventricular Rate:  101 PR Interval:  166 QRS Duration: 86 QT Interval:  346 QTC Calculation: 449 R Axis:   31 Text Interpretation: Sinus tachycardia Consider right atrial enlargement Borderline T abnormalities, diffuse leads Confirmed by Dene Gentry [id number]) on 03/24/2021 2:57:03 PM  Radiology DG Chest Port 1 View  Result Date: 03/24/2021 CLINICAL DATA:  Cough and shortness of breath for several weeks. EXAM: PORTABLE CHEST 1 VIEW COMPARISON:  01/17/2021 FINDINGS: The heart size and mediastinal contours are within normal limits. Both lungs are clear. The visualized skeletal structures are unremarkable. IMPRESSION: No active disease. Electronically Signed   By: Marlaine Hind M.D.   On: 03/24/2021 15:17    Procedures Procedures    Medications Ordered in ED Medications  predniSONE (DELTASONE) tablet 40 mg (has no administration in time range)  sodium chloride 0.9 % bolus 1,000 mL (1,000 mLs Intravenous New Bag/Given 03/24/21 1511)  ketorolac (TORADOL) 15 MG/ML injection 15 mg (15 mg Intravenous Given 03/24/21 1511)  ipratropium-albuterol (DUONEB) 0.5-2.5 (3) MG/3ML nebulizer solution 3 mL (3 mLs Nebulization Given 03/24/21 1511)     ED Course/ Medical Decision Making/ A&P                           Medical Decision Making Amount and/or Complexity of Data Reviewed Labs: ordered. Radiology: ordered.  Risk Prescription drug management.    Medical Screen Complete  This patient presented to the ED with complaint of myalgia, fatigue, wheezing.  This complaint involves an extensive number of treatment options. The initial differential diagnosis includes, but is not limited to, viral infection, bronchospasm, metabolic abnormality,etc.  This presentation is: Acute, Previously Undiagnosed, Uncertain Prognosis, Complicated, Systemic Symptoms, and Threat to Life/Bodily Function  Patient presents with complaint of myalgia, fatigue, and body aches.  Patient also reports some mild bronchospasm and wheezing.  Symptoms are consistent with likely early viral syndrome.  Patient reports known contact with flu positive patient.  COVID and flu testing today is negative.  Other screening labs are without significant reality.  Patient does feel improved after IV fluids and administration of breathing treatment.  Patient would benefit from short course of oral steroids.  Patient appears to be appropriate for close outpatient follow-up.  Strict return precautions given and understood.    Co morbidities that complicated the patient's evaluation  History of Asthma, history of RA, history of OA   Additional history obtained:  External records from outside sources obtained and reviewed including prior ED visits and prior Inpatient records.    Lab Tests:  I ordered and personally interpreted labs.  The pertinent results include:   cbc cmp covid/flu   Imaging Studies ordered:  I ordered imaging studies including cxr  I independently visualized and interpreted obtained imaging which showed NAD I agree with the radiologist interpretation.   Cardiac Monitoring:  The patient was maintained on a cardiac monitor.  I  personally viewed and interpreted the cardiac monitor which showed an underlying rhythm of: NSR   Medicines ordered:  I ordered medication including toradol, duoneb, prednisone  for pain, bronchospasm  Reevaluation of the patient after these medicines showed that the patient: improved     Problem List / ED Course:  Likely viral syndrome, bronchospasm   Reevaluation:  After the interventions noted above, I reevaluated  the patient and found that they have: improved   Disposition:  After consideration of the diagnostic results and the patients response to treatment, I feel that the patent would benefit from close outpatient follow-up.          Final Clinical Impression(s) / ED Diagnoses Final diagnoses:  Flu-like symptoms  Myalgia    Rx / DC Orders ED Discharge Orders          Ordered    methylPREDNISolone (MEDROL DOSEPAK) 4 MG TBPK tablet        03/24/21 1624              Valarie Merino, MD 03/24/21 1635

## 2021-03-24 NOTE — ED Triage Notes (Signed)
Pt reports body aches, chills, nausea and diarrhea over the past couple days. Denies vomiting and abdominal pain.

## 2021-03-25 NOTE — Telephone Encounter (Signed)
Next Visit: 04/10/2021  Last Visit: 02/28/2021  Last Fill: 07/16/2020  Dx: Rheumatoid arthritis involving multiple sites with positive rheumatoid factor   Current Dose per office note on 03/02/3843: folic acid 2 mg daily  Okay to refill Folic Acid?

## 2021-03-28 NOTE — Progress Notes (Unsigned)
Office Visit Note  Patient: Elizabeth Pope             Date of Birth: April 11, 1980           MRN: [id number]             PCP: Kristie Cowman, MD Referring: Kristie Cowman, MD Visit Date: 04/10/2021 Occupation: @GUAROCC @  Subjective:  No chief complaint on file.   History of Present Illness: Elizabeth Pope is a 41 y.o. female ***   Activities of Daily Living:  Patient reports morning stiffness for *** {minute/hour:19697}.   Patient {ACTIONS;DENIES/REPORTS:21021675::"Denies"} nocturnal pain.  Difficulty dressing/grooming: {ACTIONS;DENIES/REPORTS:21021675::"Denies"} Difficulty climbing stairs: {ACTIONS;DENIES/REPORTS:21021675::"Denies"} Difficulty getting out of chair: {ACTIONS;DENIES/REPORTS:21021675::"Denies"} Difficulty using hands for taps, buttons, cutlery, and/or writing: {ACTIONS;DENIES/REPORTS:21021675::"Denies"}  No Rheumatology ROS completed.   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   Pelvic pain 06/02/2017   Atypical squamous cell changes of undetermined significance (ASCUS) on cervical cytology with negative high risk human papilloma virus (HPV) test result 02/03/2017   Encounter for IUD insertion 01/28/2017   Uterine fibroid 01/21/2017   H/O tubal ligation 06/24/2016   High risk medication use 01/07/2016   GERD (gastroesophageal reflux disease) 12/25/2015   Migraines 12/25/2015   Hypertension 12/25/2015   RA (rheumatoid arthritis) ([place]) 12/25/2015   DDD (degenerative disc disease), cervical 12/25/2015   Osteoarthritis of both hands 12/25/2015   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/2015   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   TB lung, latent 05/14/2015   Bipolar 2 disorder ([place]) 05/14/2015   Cigarette smoker 05/05/2015   Chest pain 05/05/2015   Solitary pulmonary nodule 05/04/2015    Past Medical  History:  Diagnosis Date   Anxiety    Bipolar 1 disorder ([place])    DDD (degenerative disc disease), cervical 12/25/2015   Depression    GERD (gastroesophageal reflux disease) 12/25/2015   Hypertension 12/25/2015   Migraine    Migraines 12/25/2015   OCD (obsessive compulsive disorder)    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/2015   mild   Osteoarthritis of both hands 12/25/2015   PTSD (post-traumatic stress disorder)    RA (rheumatoid arthritis) ([place]) 12/25/2015   Positive RF, Elevated ESR, Positive CCP > 250     Family History  Problem Relation Age of Onset   Rheum arthritis Mother    Migraines Mother    Hypertension Mother    Colitis Mother    Rheum arthritis Maternal Grandmother    Rheum arthritis Maternal Aunt    Migraines Sister    Migraines Brother    Sickle cell anemia Daughter    Bipolar disorder Daughter    Anxiety disorder Daughter    Depression Daughter    Migraines Daughter    Arrhythmia Daughter    Migraines Son    ADD / ADHD Son    Seizures Son    Arrhythmia Son    Past Surgical History:  Procedure Laterality Date   CESAREAN SECTION     TUBAL LIGATION     Social History   Social History Narrative   Not on file   Immunization History  Administered Date(s) Administered   PFIZER(Purple Top)SARS-COV-2 Vaccination 01/05/2020, 01/26/2020     Objective: Vital Signs: There were no vitals taken for this visit.   Physical Exam   Musculoskeletal Exam: ***  CDAI Exam: CDAI Score: -- Patient Global: --; Provider Global: -- Swollen: --; Tender: --  Joint Exam 04/10/2021   No joint exam has been documented for this visit   There is currently no information documented on the homunculus. Go to the Rheumatology activity and complete the homunculus joint exam.  Investigation: No additional findings.  Imaging: DG Chest Port 1 View  Result Date: 03/24/2021 CLINICAL DATA:  Cough and shortness of breath for several weeks.  EXAM: PORTABLE CHEST 1 VIEW COMPARISON:  01/17/2021 FINDINGS: The heart size and mediastinal contours are within normal limits. Both lungs are clear. The visualized skeletal structures are unremarkable. IMPRESSION: No active disease. Electronically Signed   By: Marlaine Hind M.D.   On: 03/24/2021 15:17    Recent Labs: Lab Results  Component Value Date   WBC 10.3 03/24/2021   HGB 14.5 03/24/2021   PLT 285 03/24/2021   NA 136 03/24/2021   K 3.7 03/24/2021   CL 106 03/24/2021   CO2 20 (L) 03/24/2021   GLUCOSE 119 (H) 03/24/2021   BUN 14 03/24/2021   CREATININE 0.74 03/24/2021   BILITOT 0.6 03/24/2021   ALKPHOS 43 03/24/2021   AST 18 03/24/2021   ALT 24 03/24/2021   PROT 7.6 03/24/2021   ALBUMIN 3.6 03/24/2021   CALCIUM 9.1 03/24/2021   GFRAA 90 07/03/2020   QFTBGOLDPLUS NEGATIVE 02/05/2021    Speciality Comments: PLQ Eye Exam: WNL 03/24/17 @ [facility] Prior therapy: Humira (facial rash) Humira -> Enbrel (stopped due to waning response) - >Orencia (injection site reaction) one dose on 02/19/21 --> Simponi started 03/06/21  Procedures:  No procedures performed Allergies: Doxycycline, Fluoxetine, Hydrocodone, Adalimumab, Hydrochlorothiazide, Hydrocodone-acetaminophen, Hydroxychloroquine, Other, and Vicodin [hydrocodone-acetaminophen]   Assessment / Plan:     Visit Diagnoses: No diagnosis found.  Orders: No orders of the defined types were placed in this encounter.  No orders of the defined types were placed in this encounter.   Face-to-face time spent with patient was *** minutes. Greater than 50% of time was spent in counseling and coordination of care.  Follow-Up Instructions: No follow-ups on file.   Earnestine Mealing, CMA  Note - This record has been created using Editor, commissioning.  Chart creation errors have been sought, but may not always  have been located. Such creation errors do not reflect on  the standard of medical care.

## 2021-04-04 ENCOUNTER — Ambulatory Visit: Payer: Medicare Other | Admitting: Physician Assistant

## 2021-04-05 NOTE — Progress Notes (Unsigned)
Office Visit Note  Patient: Elizabeth Pope             Date of Birth: 1981-02-14           MRN: [id number]             PCP: Kristie Cowman, MD Referring: Kristie Cowman, MD Visit Date: 04/09/2021 Occupation: @GUAROCC @  Subjective:  No chief complaint on file.   History of Present Illness: Elizabeth Pope is a 41 y.o. female ***   Activities of Daily Living:  Patient reports morning stiffness for *** {minute/hour:19697}.   Patient {ACTIONS;DENIES/REPORTS:21021675::"Denies"} nocturnal pain.  Difficulty dressing/grooming: {ACTIONS;DENIES/REPORTS:21021675::"Denies"} Difficulty climbing stairs: {ACTIONS;DENIES/REPORTS:21021675::"Denies"} Difficulty getting out of chair: {ACTIONS;DENIES/REPORTS:21021675::"Denies"} Difficulty using hands for taps, buttons, cutlery, and/or writing: {ACTIONS;DENIES/REPORTS:21021675::"Denies"}  No Rheumatology ROS completed.   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   Pelvic pain 06/02/2017   Atypical squamous cell changes of undetermined significance (ASCUS) on cervical cytology with negative high risk human papilloma virus (HPV) test result 02/03/2017   Encounter for IUD insertion 01/28/2017   Uterine fibroid 01/21/2017   H/O tubal ligation 06/24/2016   High risk medication use 01/07/2016   GERD (gastroesophageal reflux disease) 12/25/2015   Migraines 12/25/2015   Hypertension 12/25/2015   RA (rheumatoid arthritis) ([place]) 12/25/2015   DDD (degenerative disc disease), cervical 12/25/2015   Osteoarthritis of both hands 12/25/2015   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/2015   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   TB lung, latent 05/14/2015   Bipolar 2 disorder ([place]) 05/14/2015   Cigarette smoker 05/05/2015   Chest pain 05/05/2015   Solitary pulmonary nodule 05/04/2015    Past Medical History:  Diagnosis  Date   Anxiety    Bipolar 1 disorder ([place])    DDD (degenerative disc disease), cervical 12/25/2015   Depression    GERD (gastroesophageal reflux disease) 12/25/2015   Hypertension 12/25/2015   Migraine    Migraines 12/25/2015   OCD (obsessive compulsive disorder)    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/2015   mild   Osteoarthritis of both hands 12/25/2015   PTSD (post-traumatic stress disorder)    RA (rheumatoid arthritis) ([place]) 12/25/2015   Positive RF, Elevated ESR, Positive CCP > 250     Family History  Problem Relation Age of Onset   Rheum arthritis Mother    Migraines Mother    Hypertension Mother    Colitis Mother    Rheum arthritis Maternal Grandmother    Rheum arthritis Maternal Aunt    Migraines Sister    Migraines Brother    Sickle cell anemia Daughter    Bipolar disorder Daughter    Anxiety disorder Daughter    Depression Daughter    Migraines Daughter    Arrhythmia Daughter    Migraines Son    ADD / ADHD Son    Seizures Son    Arrhythmia Son    Past Surgical History:  Procedure Laterality Date   CESAREAN SECTION     TUBAL LIGATION     Social History   Social History Narrative   Not on file   Immunization History  Administered Date(s) Administered   PFIZER(Purple Top)SARS-COV-2 Vaccination 01/05/2020, 01/26/2020     Objective: Vital Signs: There were no vitals taken for this visit.   Physical Exam   Musculoskeletal Exam: ***  CDAI Exam: CDAI Score: -- Patient Global: --; Provider Global: -- Swollen: --; Tender: --  Joint Exam 04/09/2021   No joint exam has been documented for this visit   There is currently no information documented on the homunculus. Go to the Rheumatology activity and complete the homunculus joint exam.  Investigation: No additional findings.  Imaging: DG Chest Port 1 View  Result Date: 03/24/2021 CLINICAL DATA:  Cough and shortness of breath for several weeks. EXAM: PORTABLE CHEST 1 VIEW COMPARISON:  01/17/2021  FINDINGS: The heart size and mediastinal contours are within normal limits. Both lungs are clear. The visualized skeletal structures are unremarkable. IMPRESSION: No active disease. Electronically Signed   By: Marlaine Hind M.D.   On: 03/24/2021 15:17    Recent Labs: Lab Results  Component Value Date   WBC 10.3 03/24/2021   HGB 14.5 03/24/2021   PLT 285 03/24/2021   NA 136 03/24/2021   K 3.7 03/24/2021   CL 106 03/24/2021   CO2 20 (L) 03/24/2021   GLUCOSE 119 (H) 03/24/2021   BUN 14 03/24/2021   CREATININE 0.74 03/24/2021   BILITOT 0.6 03/24/2021   ALKPHOS 43 03/24/2021   AST 18 03/24/2021   ALT 24 03/24/2021   PROT 7.6 03/24/2021   ALBUMIN 3.6 03/24/2021   CALCIUM 9.1 03/24/2021   GFRAA 90 07/03/2020   QFTBGOLDPLUS NEGATIVE 02/05/2021    Speciality Comments: PLQ Eye Exam: WNL 03/24/17 @ [facility] Prior therapy: Humira (facial rash) Humira -> Enbrel (stopped due to waning response) - >Orencia (injection site reaction) one dose on 02/19/21 --> Simponi started 03/06/21  Procedures:  No procedures performed Allergies: Doxycycline, Fluoxetine, Hydrocodone, Adalimumab, Hydrochlorothiazide, Hydrocodone-acetaminophen, Hydroxychloroquine, Other, and Vicodin [hydrocodone-acetaminophen]   Assessment / Plan:     Visit Diagnoses: No diagnosis found.  Orders: No orders of the defined types were placed in this encounter.  No orders of the defined types were placed in this encounter.   Face-to-face time spent with patient was *** minutes. Greater than 50% of time was spent in counseling and coordination of care.  Follow-Up Instructions: No follow-ups on file.   Earnestine Mealing, CMA  Note - This record has been created using Editor, commissioning.  Chart creation errors have been sought, but may not always  have been located. Such creation errors do not reflect on  the standard of medical care.

## 2021-04-09 ENCOUNTER — Ambulatory Visit: Payer: Medicare Other | Admitting: Rheumatology

## 2021-04-09 DIAGNOSIS — M62838 Other muscle spasm: Secondary | ICD-10-CM

## 2021-04-09 DIAGNOSIS — M79641 Pain in right hand: Secondary | ICD-10-CM

## 2021-04-09 DIAGNOSIS — Z79899 Other long term (current) drug therapy: Secondary | ICD-10-CM

## 2021-04-09 DIAGNOSIS — M0579 Rheumatoid arthritis with rheumatoid factor of multiple sites without organ or systems involvement: Secondary | ICD-10-CM

## 2021-04-09 DIAGNOSIS — H5789 Other specified disorders of eye and adnexa: Secondary | ICD-10-CM

## 2021-04-09 DIAGNOSIS — M2241 Chondromalacia patellae, right knee: Secondary | ICD-10-CM

## 2021-04-09 DIAGNOSIS — R911 Solitary pulmonary nodule: Secondary | ICD-10-CM

## 2021-04-09 DIAGNOSIS — M19071 Primary osteoarthritis, right ankle and foot: Secondary | ICD-10-CM

## 2021-04-09 DIAGNOSIS — Z8719 Personal history of other diseases of the digestive system: Secondary | ICD-10-CM

## 2021-04-09 DIAGNOSIS — M5030 Other cervical disc degeneration, unspecified cervical region: Secondary | ICD-10-CM

## 2021-04-09 DIAGNOSIS — M797 Fibromyalgia: Secondary | ICD-10-CM

## 2021-04-09 DIAGNOSIS — F3181 Bipolar II disorder: Secondary | ICD-10-CM

## 2021-04-09 DIAGNOSIS — I10 Essential (primary) hypertension: Secondary | ICD-10-CM

## 2021-04-09 DIAGNOSIS — M170 Bilateral primary osteoarthritis of knee: Secondary | ICD-10-CM

## 2021-04-09 DIAGNOSIS — G8929 Other chronic pain: Secondary | ICD-10-CM

## 2021-04-09 DIAGNOSIS — F17210 Nicotine dependence, cigarettes, uncomplicated: Secondary | ICD-10-CM

## 2021-04-09 DIAGNOSIS — M19041 Primary osteoarthritis, right hand: Secondary | ICD-10-CM

## 2021-04-09 DIAGNOSIS — Z8669 Personal history of other diseases of the nervous system and sense organs: Secondary | ICD-10-CM

## 2021-04-10 ENCOUNTER — Ambulatory Visit: Payer: Medicare Other | Admitting: Rheumatology

## 2021-04-10 DIAGNOSIS — M62838 Other muscle spasm: Secondary | ICD-10-CM

## 2021-04-10 DIAGNOSIS — M5030 Other cervical disc degeneration, unspecified cervical region: Secondary | ICD-10-CM

## 2021-04-10 DIAGNOSIS — M19071 Primary osteoarthritis, right ankle and foot: Secondary | ICD-10-CM

## 2021-04-10 DIAGNOSIS — G8929 Other chronic pain: Secondary | ICD-10-CM

## 2021-04-10 DIAGNOSIS — M19042 Primary osteoarthritis, left hand: Secondary | ICD-10-CM

## 2021-04-10 DIAGNOSIS — M2241 Chondromalacia patellae, right knee: Secondary | ICD-10-CM

## 2021-04-10 DIAGNOSIS — I10 Essential (primary) hypertension: Secondary | ICD-10-CM

## 2021-04-10 DIAGNOSIS — Z8669 Personal history of other diseases of the nervous system and sense organs: Secondary | ICD-10-CM

## 2021-04-10 DIAGNOSIS — F3181 Bipolar II disorder: Secondary | ICD-10-CM

## 2021-04-10 DIAGNOSIS — M0579 Rheumatoid arthritis with rheumatoid factor of multiple sites without organ or systems involvement: Secondary | ICD-10-CM

## 2021-04-10 DIAGNOSIS — M797 Fibromyalgia: Secondary | ICD-10-CM

## 2021-04-10 DIAGNOSIS — M170 Bilateral primary osteoarthritis of knee: Secondary | ICD-10-CM

## 2021-04-10 DIAGNOSIS — M79641 Pain in right hand: Secondary | ICD-10-CM

## 2021-04-10 DIAGNOSIS — Z8719 Personal history of other diseases of the digestive system: Secondary | ICD-10-CM

## 2021-04-10 DIAGNOSIS — Z79899 Other long term (current) drug therapy: Secondary | ICD-10-CM

## 2021-04-10 DIAGNOSIS — R911 Solitary pulmonary nodule: Secondary | ICD-10-CM

## 2021-04-10 DIAGNOSIS — F17210 Nicotine dependence, cigarettes, uncomplicated: Secondary | ICD-10-CM

## 2021-04-10 DIAGNOSIS — H5789 Other specified disorders of eye and adnexa: Secondary | ICD-10-CM

## 2021-04-25 ENCOUNTER — Telehealth: Payer: Self-pay

## 2021-04-25 NOTE — Telephone Encounter (Signed)
Patient advised to contact [place] as she should have 1 more refill. Patient expressed understanding.  ?

## 2021-04-25 NOTE — Telephone Encounter (Signed)
Patient called requesting prescription refill of Simponi. ?

## 2021-04-29 NOTE — Progress Notes (Signed)
Office Visit Note  Patient: Elizabeth Pope             Date of Birth: 06-22-1980           MRN: [id number]             PCP: Kristie Cowman, MD Referring: Kristie Cowman, MD Visit Date: 04/30/2021 Occupation: @GUAROCC @  Subjective:  Medication monitoring   History of Present Illness: Elizabeth Pope is a 41 y.o. female with history of rheumatoid arthritis.  She is remains on Simponi 50mg  sq injections every 30 days and methotrexate 0.8 mL sq injections once weekly.  She was started on Simponi on 03/06/2021.  She has been tolerating Simponi without any side effects or injection site reactions.  She has started to notice improvement on combination therapy.  She had a flare in mid February but otherwise has not had any increased joint pain or inflammation.  She states overall her rheumatoid arthritis has been stable. She has not had any recent infections.  She denies any new medical conditions.     Activities of Daily Living:  Patient reports morning stiffness for 30 minutes.   Patient Reports nocturnal pain.  Difficulty dressing/grooming: Reports Difficulty climbing stairs: Reports Difficulty getting out of chair: Reports Difficulty using hands for taps, buttons, cutlery, and/or writing: Reports  Review of Systems  Constitutional:  Positive for fatigue.  HENT:  Positive for mouth dryness and nose dryness. Negative for mouth sores.   Eyes:  Positive for pain, redness, itching and dryness.  Respiratory:  Negative for shortness of breath and difficulty breathing.   Cardiovascular:  Negative for chest pain and palpitations.  Gastrointestinal:  Positive for diarrhea. Negative for blood in stool and constipation.  Endocrine: Positive for increased urination.  Genitourinary:  Negative for difficulty urinating.  Musculoskeletal:  Positive for joint pain, joint pain, joint swelling, myalgias, morning stiffness, muscle tenderness and myalgias.  Skin:  Negative for color change,  rash and redness.  Allergic/Immunologic: Negative for susceptible to infections.  Neurological:  Positive for dizziness, numbness, headaches and weakness. Negative for memory loss.  Hematological:  Positive for bruising/bleeding tendency.  Psychiatric/Behavioral:  Negative for confusion.    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   Pelvic pain 06/02/2017   Atypical squamous cell changes of undetermined significance (ASCUS) on cervical cytology with negative high risk human papilloma virus (HPV) test result 02/03/2017   Encounter for IUD insertion 01/28/2017   Uterine fibroid 01/21/2017   H/O tubal ligation 06/24/2016   High risk medication use 01/07/2016   GERD (gastroesophageal reflux disease) 12/25/2015   Migraines 12/25/2015   Hypertension 12/25/2015   RA (rheumatoid arthritis) ([place]) 12/25/2015   DDD (degenerative disc disease), cervical 12/25/2015   Osteoarthritis of both hands 12/25/2015   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/2015   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   TB lung, latent 05/14/2015   Bipolar 2 disorder ([place]) 05/14/2015   Cigarette smoker 05/05/2015   Chest pain 05/05/2015   Solitary pulmonary nodule 05/04/2015    Past Medical History:  Diagnosis Date   Anxiety    Bipolar 1 disorder ([place])    DDD (degenerative disc disease), cervical 12/25/2015   Depression    GERD (gastroesophageal reflux disease) 12/25/2015   Hypertension 12/25/2015   Migraine    Migraines 12/25/2015   OCD (obsessive compulsive disorder)    Osteoarthritis of both feet  12/25/2015   mild   Osteoarthritis of both hands 12/25/2015   PTSD (post-traumatic stress disorder)    RA (rheumatoid arthritis) ([place]) 12/25/2015   Positive RF, Elevated ESR, Positive CCP > 250     Family History  Problem Relation Age of Onset   Rheum arthritis Mother    Migraines Mother     Hypertension Mother    Colitis Mother    Rheum arthritis Maternal Grandmother    Rheum arthritis Maternal Aunt    Migraines Sister    Migraines Brother    Sickle cell anemia Daughter    Bipolar disorder Daughter    Anxiety disorder Daughter    Depression Daughter    Migraines Daughter    Arrhythmia Daughter    Migraines Son    ADD / ADHD Son    Seizures Son    Arrhythmia Son    Past Surgical History:  Procedure Laterality Date   CESAREAN SECTION     TUBAL LIGATION     Social History   Social History Narrative   Not on file   Immunization History  Administered Date(s) Administered   PFIZER(Purple Top)SARS-COV-2 Vaccination 01/05/2020, 01/26/2020     Objective: Vital Signs: BP (!) 141/93 (BP Location: Left Arm, Patient Position: Sitting, Cuff Size: Normal)    Pulse 70    Ht 5' 3"  (1.6 m)    Wt 182 lb 12.8 oz (82.9 kg)    BMI 32.38 kg/m    Physical Exam Vitals and nursing note reviewed.  Constitutional:      Appearance: She is well-developed.  HENT:     Head: Normocephalic and atraumatic.  Eyes:     Conjunctiva/sclera: Conjunctivae normal.  Pulmonary:     Effort: Pulmonary effort is normal.  Abdominal:     Palpations: Abdomen is soft.  Musculoskeletal:     Cervical back: Normal range of motion.  Skin:    General: Skin is warm and dry.     Capillary Refill: Capillary refill takes less than 2 seconds.     Comments: Rheumatoid nodule palpable on the extensor surface of the right elbow   Neurological:     Mental Status: She is alert and oriented to person, place, and time.  Psychiatric:        Behavior: Behavior normal.     Musculoskeletal Exam: C-spine has good ROM with no discomfort. Painful ROM of the left shoulder with tenderness upon palpation.  Right shoulder has good ROM with no discomfort. Elbow joints, wrist joints, Mcps, PIPs, and DIPs good ROM with no synovitis.  Complete fist formation bilaterally.  Hip joints, knee joints, and ankle joints have good  ROM with no discomfort.  No warmth or effusion of knee joints.  No tenderness or swelling of ankle joints.   CDAI Exam: CDAI Score: 0.4  Patient Global: 2 mm; Provider Global: 2 mm Swollen: 0 ; Tender: 0  Joint Exam 04/30/2021   No joint exam has been documented for this visit   There is currently no information documented on the homunculus. Go to the Rheumatology activity and complete the homunculus joint exam.  Investigation: No additional findings.  Imaging: No results found.  Recent Labs: Lab Results  Component Value Date   WBC 10.3 03/24/2021   HGB 14.5 03/24/2021   PLT 285 03/24/2021   NA 136 03/24/2021   K 3.7 03/24/2021   CL 106 03/24/2021   CO2 20 (L) 03/24/2021   GLUCOSE 119 (H) 03/24/2021   BUN 14 03/24/2021   CREATININE  0.74 03/24/2021   BILITOT 0.6 03/24/2021   ALKPHOS 43 03/24/2021   AST 18 03/24/2021   ALT 24 03/24/2021   PROT 7.6 03/24/2021   ALBUMIN 3.6 03/24/2021   CALCIUM 9.1 03/24/2021   GFRAA 90 07/03/2020   QFTBGOLDPLUS NEGATIVE 02/05/2021    Speciality Comments: PLQ Eye Exam: WNL 03/24/17 @ [facility] Prior therapy: Humira (facial rash) Humira -> Enbrel (stopped due to waning response) - >Orencia (injection site reaction) one dose on 02/19/21 --> Simponi started 03/06/21  Procedures:  No procedures performed Allergies: Doxycycline, Fluoxetine, Hydrocodone, Adalimumab, Hydrochlorothiazide, Hydrocodone-acetaminophen, Hydroxychloroquine, Other, and Vicodin [hydrocodone-acetaminophen]   Assessment / Plan:     Visit Diagnoses: Rheumatoid arthritis involving multiple sites with positive rheumatoid factor ([place]): She has no synovitis on examination today.  She is not currently experiencing a rheumatoid arthritis flare.  She has noticed clinical improvement on Simponi 50 mg sq injections every 30 days and methotrexate 0.8 mL sq injections once weekly.  She is tolerating Simponi without any side effects and has not missed any doses since initiating  therapy on 03/06/2021.  She currently rates her pain a 2 out of 10.  Her left shoulder joint discomfort has improved.  She will remain on combination therapy as prescribed.  She was advised to notify us if she develops signs or symptoms of a flare.  She will follow-up in the office in 3 months.  High risk medication use - Simponi $RemoveB'50mg'XzkjrEGn$  subcu every 30 days, methotrexate 0.8 mL subcu weekly. Initiated simponi on 03/06/21.   CBC and CMP updated on 03/24/21.  She will be due to update lab work in April and every 3 months.  Standing orders for CBC and CMP remain in place.   TB gold negative on 02/05/21.   Discussed the importance of holding simponi and methotrexate if she develops signs or symptoms of an infection and to resume once the infection has completely cleared.  Previous therapy: Enbrel, Humira (facial rash), plaquenil (diarrhea), and Orencia (injection site reaction-after 1st injection on 02/19/21).   Chronic left shoulder pain: Improved.   Primary osteoarthritis of both hands: She had an ultrasound of both hands on 02/07/2021 which revealed synovitis.  Her hand pain and stiffness have improved since starting simponi and continuing injectable MTX as prescribed.   Primary osteoarthritis of both knees: She has good range of motion of both knee joints on examination today.  No warmth or effusion was noted.  Chondromalacia of both patellae: No knee crepitus was noted on exam.  Primary osteoarthritis of both feet: She is not experiencing any increased discomfort in her feet at this time.  Trapezius muscle spasm: She has some trapezius muscle tension and tenderness bilaterally.  DDD (degenerative disc disease), cervical: C-spine range of motion has improved.  No symptoms of radiculopathy.  Chronic midline low back pain without sciatica: Improved.  No midline spinal tenderness.  Fibromyalgia: She experiences intermittent myalgias and muscle tenderness due to fibromyalgia.  She has not had any  recent flares.  Other medical conditions are listed as follows:   Essential hypertension  History of gastroesophageal reflux (GERD)  History of migraine  Bipolar 2 disorder ([place])  Solitary pulmonary nodule  Cigarette smoker  Orders: No orders of the defined types were placed in this encounter.  No orders of the defined types were placed in this encounter.    Follow-Up Instructions: Return in about 3 months (around 07/31/2021) for Rheumatoid arthritis.   Ofilia Neas, PA-C  Note - This record has been  created using [employer].  Chart creation errors have been sought, but may not always  have been located. Such creation errors do not reflect on  the standard of medical care.

## 2021-04-30 ENCOUNTER — Encounter: Payer: Self-pay | Admitting: Physician Assistant

## 2021-04-30 ENCOUNTER — Ambulatory Visit (INDEPENDENT_AMBULATORY_CARE_PROVIDER_SITE_OTHER): Payer: Medicare Other | Admitting: Physician Assistant

## 2021-04-30 ENCOUNTER — Other Ambulatory Visit: Payer: Self-pay

## 2021-04-30 VITALS — BP 141/93 | HR 70 | Ht 63.0 in | Wt 182.8 lb

## 2021-04-30 DIAGNOSIS — M2242 Chondromalacia patellae, left knee: Secondary | ICD-10-CM

## 2021-04-30 DIAGNOSIS — M170 Bilateral primary osteoarthritis of knee: Secondary | ICD-10-CM

## 2021-04-30 DIAGNOSIS — M797 Fibromyalgia: Secondary | ICD-10-CM

## 2021-04-30 DIAGNOSIS — F17210 Nicotine dependence, cigarettes, uncomplicated: Secondary | ICD-10-CM

## 2021-04-30 DIAGNOSIS — M79642 Pain in left hand: Secondary | ICD-10-CM

## 2021-04-30 DIAGNOSIS — G8929 Other chronic pain: Secondary | ICD-10-CM

## 2021-04-30 DIAGNOSIS — M0579 Rheumatoid arthritis with rheumatoid factor of multiple sites without organ or systems involvement: Secondary | ICD-10-CM

## 2021-04-30 DIAGNOSIS — M5030 Other cervical disc degeneration, unspecified cervical region: Secondary | ICD-10-CM

## 2021-04-30 DIAGNOSIS — M19071 Primary osteoarthritis, right ankle and foot: Secondary | ICD-10-CM

## 2021-04-30 DIAGNOSIS — Z8719 Personal history of other diseases of the digestive system: Secondary | ICD-10-CM

## 2021-04-30 DIAGNOSIS — M79641 Pain in right hand: Secondary | ICD-10-CM

## 2021-04-30 DIAGNOSIS — M19042 Primary osteoarthritis, left hand: Secondary | ICD-10-CM

## 2021-04-30 DIAGNOSIS — M25512 Pain in left shoulder: Secondary | ICD-10-CM | POA: Diagnosis not present

## 2021-04-30 DIAGNOSIS — M545 Low back pain, unspecified: Secondary | ICD-10-CM

## 2021-04-30 DIAGNOSIS — F3181 Bipolar II disorder: Secondary | ICD-10-CM

## 2021-04-30 DIAGNOSIS — M19041 Primary osteoarthritis, right hand: Secondary | ICD-10-CM

## 2021-04-30 DIAGNOSIS — M62838 Other muscle spasm: Secondary | ICD-10-CM

## 2021-04-30 DIAGNOSIS — R911 Solitary pulmonary nodule: Secondary | ICD-10-CM

## 2021-04-30 DIAGNOSIS — Z79899 Other long term (current) drug therapy: Secondary | ICD-10-CM

## 2021-04-30 DIAGNOSIS — I10 Essential (primary) hypertension: Secondary | ICD-10-CM

## 2021-04-30 DIAGNOSIS — M2241 Chondromalacia patellae, right knee: Secondary | ICD-10-CM

## 2021-04-30 DIAGNOSIS — Z8669 Personal history of other diseases of the nervous system and sense organs: Secondary | ICD-10-CM

## 2021-04-30 DIAGNOSIS — H5789 Other specified disorders of eye and adnexa: Secondary | ICD-10-CM

## 2021-04-30 DIAGNOSIS — M19072 Primary osteoarthritis, left ankle and foot: Secondary | ICD-10-CM

## 2021-04-30 NOTE — Patient Instructions (Signed)
Standing Labs ?We placed an order today for your standing lab work.  ? ?Please have your standing labs drawn in April and every 3 months  ? ?If possible, please have your labs drawn 2 weeks prior to your appointment so that the provider can discuss your results at your appointment. ? ?Please note that you may see your imaging and lab results in MyChart before we have reviewed them. ?We may be awaiting multiple results to interpret others before contacting you. ?Please allow our office up to 72 hours to thoroughly review all of the results before contacting the office for clarification of your results. ? ?We have open lab daily: ?Monday through Thursday from 1:30-4:30 PM and Friday from 1:30-4:00 PM ?at the office of Dr. Shaili Deveshwar,  Rheumatology.   ?Please be advised, all patients with office appointments requiring lab work will take precedent over walk-in lab work.  ?If possible, please come for your lab work on Monday and Friday afternoons, as you may experience shorter wait times. ?The office is located at [address], [address], [place], [place] [address] ?No appointment is necessary.   ?Labs are drawn by [facility]. Please bring your co-pay at the time of your lab draw.  You may receive a bill from [facility] for your lab work. ? ?Please note if you are on Hydroxychloroquine and and an order has been placed for a Hydroxychloroquine level, you will need to have it drawn 4 hours or more after your last dose. ? ?If you wish to have your labs drawn at another location, please call the office 24 hours in advance to send orders. ? ?If you have any questions regarding directions or hours of operation,  ?please call [phone].   ?As a reminder, please drink plenty of water prior to coming for your lab work. Thanks! ? ?

## 2021-05-07 ENCOUNTER — Other Ambulatory Visit (HOSPITAL_COMMUNITY): Payer: Self-pay

## 2021-05-08 ENCOUNTER — Ambulatory Visit: Payer: Medicare Other | Admitting: Physician Assistant

## 2021-05-30 ENCOUNTER — Other Ambulatory Visit (HOSPITAL_COMMUNITY): Payer: Self-pay

## 2021-06-03 ENCOUNTER — Other Ambulatory Visit (HOSPITAL_COMMUNITY): Payer: Self-pay

## 2021-06-05 ENCOUNTER — Other Ambulatory Visit (HOSPITAL_COMMUNITY): Payer: Self-pay

## 2021-06-28 ENCOUNTER — Other Ambulatory Visit (HOSPITAL_COMMUNITY): Payer: Self-pay

## 2021-07-01 ENCOUNTER — Other Ambulatory Visit (HOSPITAL_COMMUNITY): Payer: Self-pay

## 2021-07-04 ENCOUNTER — Other Ambulatory Visit (HOSPITAL_COMMUNITY): Payer: Self-pay

## 2021-07-23 ENCOUNTER — Other Ambulatory Visit: Payer: Self-pay | Admitting: Physician Assistant

## 2021-07-23 ENCOUNTER — Other Ambulatory Visit (HOSPITAL_COMMUNITY): Payer: Self-pay

## 2021-07-23 DIAGNOSIS — Z79899 Other long term (current) drug therapy: Secondary | ICD-10-CM

## 2021-07-23 DIAGNOSIS — M0579 Rheumatoid arthritis with rheumatoid factor of multiple sites without organ or systems involvement: Secondary | ICD-10-CM

## 2021-07-23 MED ORDER — SIMPONI 50 MG/0.5ML ~~LOC~~ SOAJ
50.0000 mg | SUBCUTANEOUS | 0 refills | Status: DC
  Filled 2021-07-23: qty 0.5, 30d supply, fill #0

## 2021-07-23 NOTE — Telephone Encounter (Signed)
Next Visit: 08/13/2021  Last Visit: 04/30/2021  Last Fill: 03/06/2021  WI:[id number] arthritis involving multiple sites with positive rheumatoid factor   Current Dose per office note 04/30/2021:  Simponi '50mg'$  subcu every 30 days  Labs: 03/24/2021 CO2, Glucose 119, MCHC 36.1, Neutro Abs. 8.7  TB Gold: 02/05/2021 Neg   Patient advised she is due to update labs. Patient states she will come this week or next to update.   Okay to refill Simponi?

## 2021-07-24 ENCOUNTER — Other Ambulatory Visit (HOSPITAL_COMMUNITY): Payer: Self-pay

## 2021-07-30 ENCOUNTER — Other Ambulatory Visit (HOSPITAL_COMMUNITY): Payer: Self-pay

## 2021-08-13 ENCOUNTER — Ambulatory Visit: Payer: Medicare Other | Admitting: Rheumatology

## 2021-08-13 DIAGNOSIS — M0579 Rheumatoid arthritis with rheumatoid factor of multiple sites without organ or systems involvement: Secondary | ICD-10-CM

## 2021-08-13 DIAGNOSIS — F3181 Bipolar II disorder: Secondary | ICD-10-CM

## 2021-08-13 DIAGNOSIS — R911 Solitary pulmonary nodule: Secondary | ICD-10-CM

## 2021-08-13 DIAGNOSIS — Z79899 Other long term (current) drug therapy: Secondary | ICD-10-CM

## 2021-08-13 DIAGNOSIS — M19041 Primary osteoarthritis, right hand: Secondary | ICD-10-CM

## 2021-08-13 DIAGNOSIS — M62838 Other muscle spasm: Secondary | ICD-10-CM

## 2021-08-13 DIAGNOSIS — Z8669 Personal history of other diseases of the nervous system and sense organs: Secondary | ICD-10-CM

## 2021-08-13 DIAGNOSIS — I10 Essential (primary) hypertension: Secondary | ICD-10-CM

## 2021-08-13 DIAGNOSIS — M170 Bilateral primary osteoarthritis of knee: Secondary | ICD-10-CM

## 2021-08-13 DIAGNOSIS — M5030 Other cervical disc degeneration, unspecified cervical region: Secondary | ICD-10-CM

## 2021-08-13 DIAGNOSIS — F17210 Nicotine dependence, cigarettes, uncomplicated: Secondary | ICD-10-CM

## 2021-08-13 DIAGNOSIS — M797 Fibromyalgia: Secondary | ICD-10-CM

## 2021-08-13 DIAGNOSIS — M2242 Chondromalacia patellae, left knee: Secondary | ICD-10-CM

## 2021-08-13 DIAGNOSIS — G8929 Other chronic pain: Secondary | ICD-10-CM

## 2021-08-13 DIAGNOSIS — Z8719 Personal history of other diseases of the digestive system: Secondary | ICD-10-CM

## 2021-08-13 DIAGNOSIS — M19071 Primary osteoarthritis, right ankle and foot: Secondary | ICD-10-CM

## 2021-08-15 ENCOUNTER — Other Ambulatory Visit: Payer: Self-pay | Admitting: Physician Assistant

## 2021-08-15 NOTE — Progress Notes (Deleted)
Office Visit Note  Patient: Elizabeth Pope             Date of Birth: 1980-08-11           MRN: [id number]             PCP: Kristie Cowman, MD Referring: Kristie Cowman, MD Visit Date: 08/29/2021 Occupation: _0 @  Subjective:  No chief complaint on file.   History of Present Illness: Elizabeth Pope is a 41 y.o. female ***   Previous therapy: Enbrel, Humira (facial rash), plaquenil (diarrhea), and Orencia (injection site reaction-after 1st injection on 02/19/21).   Activities of Daily Living:  Patient reports morning stiffness for *** {minute/hour:19697}.   Patient {ACTIONS;DENIES/REPORTS:21021675::"Denies"} nocturnal pain.  Difficulty dressing/grooming: {ACTIONS;DENIES/REPORTS:21021675::"Denies"} Difficulty climbing stairs: {ACTIONS;DENIES/REPORTS:21021675::"Denies"} Difficulty getting out of chair: {ACTIONS;DENIES/REPORTS:21021675::"Denies"} Difficulty using hands for taps, buttons, cutlery, and/or writing: {ACTIONS;DENIES/REPORTS:21021675::"Denies"}  No Rheumatology ROS completed.   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   Pelvic pain 06/02/2017   Atypical squamous cell changes of undetermined significance (ASCUS) on cervical cytology with negative high risk human papilloma virus (HPV) test result 02/03/2017   Encounter for IUD insertion 01/28/2017   Uterine fibroid 01/21/2017   H/O tubal ligation 06/24/2016   High risk medication use 01/07/2016   GERD (gastroesophageal reflux disease) 12/25/2015   Migraines 12/25/2015   Hypertension 12/25/2015   RA (rheumatoid arthritis) ([place]) 12/25/2015   DDD (degenerative disc disease), cervical 12/25/2015   Osteoarthritis of both hands 12/25/2015   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/2015   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   TB lung, latent 05/14/2015   Bipolar 2 disorder ([place])  05/14/2015   Cigarette smoker 05/05/2015   Chest pain 05/05/2015   Solitary pulmonary nodule 05/04/2015    Past Medical History:  Diagnosis Date   Anxiety    Bipolar 1 disorder ([place])    DDD (degenerative disc disease), cervical 12/25/2015   Depression    GERD (gastroesophageal reflux disease) 12/25/2015   Hypertension 12/25/2015   Migraine    Migraines 12/25/2015   OCD (obsessive compulsive disorder)    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/2015   mild   Osteoarthritis of both hands 12/25/2015   PTSD (post-traumatic stress disorder)    RA (rheumatoid arthritis) ([place]) 12/25/2015   Positive RF, Elevated ESR, Positive CCP > 250     Family History  Problem Relation Age of Onset   Rheum arthritis Mother    Migraines Mother    Hypertension Mother    Colitis Mother    Rheum arthritis Maternal Grandmother    Rheum arthritis Maternal Aunt    Migraines Sister    Migraines Brother    Sickle cell anemia Daughter    Bipolar disorder Daughter    Anxiety disorder Daughter    Depression Daughter    Migraines Daughter    Arrhythmia Daughter    Migraines Son    ADD / ADHD Son    Seizures Son    Arrhythmia Son    Past Surgical History:  Procedure Laterality Date   CESAREAN SECTION     TUBAL LIGATION     Social History   Social History Narrative   Not on file   Immunization History  Administered Date(s) Administered   PFIZER(Purple Top)SARS-COV-2 Vaccination 01/05/2020, 01/26/2020     Objective: Vital Signs: There were no vitals taken for this visit.   Physical Exam  Musculoskeletal Exam: ***  CDAI Exam: CDAI Score: -- Patient Global: --; Provider Global: -- Swollen: --; Tender: -- Joint Exam 08/29/2021   No joint exam has been documented for this visit   There is currently no information documented on the homunculus. Go to the Rheumatology activity and complete the homunculus joint exam.  Investigation: No additional findings.  Imaging: No results  found.  Recent Labs: Lab Results  Component Value Date   WBC 10.3 03/24/2021   HGB 14.5 03/24/2021   PLT 285 03/24/2021   NA 136 03/24/2021   K 3.7 03/24/2021   CL 106 03/24/2021   CO2 20 (L) 03/24/2021   GLUCOSE 119 (H) 03/24/2021   BUN 14 03/24/2021   CREATININE 0.74 03/24/2021   BILITOT 0.6 03/24/2021   ALKPHOS 43 03/24/2021   AST 18 03/24/2021   ALT 24 03/24/2021   PROT 7.6 03/24/2021   ALBUMIN 3.6 03/24/2021   CALCIUM 9.1 03/24/2021   GFRAA 90 07/03/2020   QFTBGOLDPLUS NEGATIVE 02/05/2021    Speciality Comments: PLQ Eye Exam: WNL 03/24/17 @ [facility],PLQ-diarrhea Prior therapy: Humira (facial rash) Humira & Enbrel (stopped due to waning response) - Orencia (injection site reaction) one dose on 02/19/21 -- Simponi started 03/06/21  Procedures:  No procedures performed Allergies: Doxycycline, Fluoxetine, Hydrocodone, Adalimumab, Hydrochlorothiazide, Hydrocodone-acetaminophen, Hydroxychloroquine, Other, and Vicodin [hydrocodone-acetaminophen]   Assessment / Plan:     Visit Diagnoses: Rheumatoid arthritis involving multiple sites with positive rheumatoid factor ([facility])  High risk medication use  Primary osteoarthritis of both hands  Primary osteoarthritis of both knees  Chondromalacia of both patellae  Primary osteoarthritis of both feet  Trapezius muscle spasm  DDD (degenerative disc disease), cervical  Fibromyalgia  History of gastroesophageal reflux (GERD)  History of migraine  Bipolar 2 disorder ([facility])  Solitary pulmonary nodule  Cigarette smoker  Essential hypertension  TB lung, latent  Orders: No orders of the defined types were placed in this encounter.  No orders of the defined types were placed in this encounter.   Face-to-face time spent with patient was *** minutes. Greater than 50% of time was spent in counseling and coordination of care.  Follow-Up Instructions: No follow-ups on file.   Ofilia Neas, PA-C  Note - This  record has been created using Dragon software.  Chart creation errors have been sought, but may not always  have been located. Such creation errors do not reflect on  the standard of medical care.

## 2021-08-20 ENCOUNTER — Other Ambulatory Visit (HOSPITAL_COMMUNITY): Payer: Self-pay

## 2021-08-29 ENCOUNTER — Ambulatory Visit: Payer: Medicare Other | Admitting: Physician Assistant

## 2021-08-29 DIAGNOSIS — M19071 Primary osteoarthritis, right ankle and foot: Secondary | ICD-10-CM

## 2021-08-29 DIAGNOSIS — R911 Solitary pulmonary nodule: Secondary | ICD-10-CM

## 2021-08-29 DIAGNOSIS — M2242 Chondromalacia patellae, left knee: Secondary | ICD-10-CM

## 2021-08-29 DIAGNOSIS — M62838 Other muscle spasm: Secondary | ICD-10-CM

## 2021-08-29 DIAGNOSIS — M0579 Rheumatoid arthritis with rheumatoid factor of multiple sites without organ or systems involvement: Secondary | ICD-10-CM

## 2021-08-29 DIAGNOSIS — Z8719 Personal history of other diseases of the digestive system: Secondary | ICD-10-CM

## 2021-08-29 DIAGNOSIS — M5030 Other cervical disc degeneration, unspecified cervical region: Secondary | ICD-10-CM

## 2021-08-29 DIAGNOSIS — M797 Fibromyalgia: Secondary | ICD-10-CM

## 2021-08-29 DIAGNOSIS — M170 Bilateral primary osteoarthritis of knee: Secondary | ICD-10-CM

## 2021-08-29 DIAGNOSIS — F17210 Nicotine dependence, cigarettes, uncomplicated: Secondary | ICD-10-CM

## 2021-08-29 DIAGNOSIS — M2241 Chondromalacia patellae, right knee: Secondary | ICD-10-CM

## 2021-08-29 DIAGNOSIS — Z227 Latent tuberculosis: Secondary | ICD-10-CM

## 2021-08-29 DIAGNOSIS — M19041 Primary osteoarthritis, right hand: Secondary | ICD-10-CM

## 2021-08-29 DIAGNOSIS — F3181 Bipolar II disorder: Secondary | ICD-10-CM

## 2021-08-29 DIAGNOSIS — Z79899 Other long term (current) drug therapy: Secondary | ICD-10-CM

## 2021-08-29 DIAGNOSIS — M19042 Primary osteoarthritis, left hand: Secondary | ICD-10-CM

## 2021-08-29 DIAGNOSIS — I10 Essential (primary) hypertension: Secondary | ICD-10-CM

## 2021-08-29 DIAGNOSIS — Z8669 Personal history of other diseases of the nervous system and sense organs: Secondary | ICD-10-CM

## 2021-09-06 ENCOUNTER — Other Ambulatory Visit: Payer: Self-pay | Admitting: Physician Assistant

## 2021-09-06 NOTE — Telephone Encounter (Signed)
Next Visit: Due June 2023. Message sent to the front to schedule. (Patient has no showed on 08/13/2021 and cancelled an appointment on 08/29/2021. Please advise patient if she cancels or no shows her next appointment she may not be rescheduled.)  Last Visit: 04/30/2021  Last Fill: 11/05/2020  Dx: Rheumatoid arthritis involving multiple sites with positive rheumatoid factor   Okay to refill Syringes?

## 2021-09-06 NOTE — Telephone Encounter (Signed)
LMOM for patient to call and schedule follow-up appointment.   °

## 2021-09-06 NOTE — Telephone Encounter (Signed)
Please schedule patient a follow up visit. Patient due June 2023. Patient has no showed on 08/13/2021 and cancelled an appointment on 08/29/2021. Please advise patient if she cancels or no shows her next appointment she may not be rescheduled. Thanks!

## 2021-09-10 ENCOUNTER — Other Ambulatory Visit (HOSPITAL_COMMUNITY): Payer: Self-pay

## 2021-09-16 ENCOUNTER — Telehealth: Payer: Self-pay | Admitting: Pharmacy Technician

## 2021-09-16 NOTE — Telephone Encounter (Signed)
Patient Advocate Encounter  Prior Authorization for [facility] has been approved.    PA# [id number] Effective dates: 07.24.23 through 12.31.23  Aveah Castell B. CPhT P: [phone] F: [phone]

## 2021-09-16 NOTE — Telephone Encounter (Signed)
Submitted a Prior Authorization request to [facility] for [facility] via CoverMyMeds. Will update once we receive a response.   KEY: [id number]

## 2021-09-24 NOTE — Progress Notes (Unsigned)
Office Visit Note  Patient: Elizabeth Pope             Date of Birth: 16-Sep-1980           MRN: [id number]             PCP: Kristie Cowman, MD Referring: Kristie Cowman, MD Visit Date: 09/25/2021 Occupation: @GUAROCC @  Subjective:  No chief complaint on file.   History of Present Illness: Elizabeth Pope is a 41 y.o. female ***   Activities of Daily Living:  Patient reports morning stiffness for *** {minute/hour:19697}.   Patient {ACTIONS;DENIES/REPORTS:21021675::"Denies"} nocturnal pain.  Difficulty dressing/grooming: {ACTIONS;DENIES/REPORTS:21021675::"Denies"} Difficulty climbing stairs: {ACTIONS;DENIES/REPORTS:21021675::"Denies"} Difficulty getting out of chair: {ACTIONS;DENIES/REPORTS:21021675::"Denies"} Difficulty using hands for taps, buttons, cutlery, and/or writing: {ACTIONS;DENIES/REPORTS:21021675::"Denies"}  No Rheumatology ROS completed.   PMFS History:  Patient Active Problem List   Diagnosis Date Noted  . Asthma with acute exacerbation 09/12/2018  . 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 Pelvic pain 06/02/2017  . Atypical squamous cell changes of undetermined significance (ASCUS) on cervical cytology with negative high risk human papilloma virus (HPV) test result 02/03/2017  . Encounter for IUD insertion 01/28/2017  . Uterine fibroid 01/21/2017  . H/O tubal ligation 06/24/2016  . High risk medication use 01/07/2016  . GERD (gastroesophageal reflux disease) 12/25/2015  . Migraines 12/25/2015  . Hypertension 12/25/2015  . RA (rheumatoid arthritis) ([place]) 12/25/2015  . DDD (degenerative disc disease), cervical 12/25/2015  . Osteoarthritis of both hands 12/25/2015  . 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/2015  . 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 Bipolar 2 disorder ([place]) 05/14/2015  . Cigarette smoker 05/05/2015  . Chest pain 05/05/2015  . Solitary pulmonary nodule 05/04/2015    Past Medical  History:  Diagnosis Date  . Anxiety   . Bipolar 1 disorder ([place])   . DDD (degenerative disc disease), cervical 12/25/2015  . Depression   . GERD (gastroesophageal reflux disease) 12/25/2015  . Hypertension 12/25/2015  . Migraine   . Migraines 12/25/2015  . OCD (obsessive compulsive disorder)   . 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/2015   mild  . Osteoarthritis of both hands 12/25/2015  . PTSD (post-traumatic stress disorder)   . RA (rheumatoid arthritis) ([facility]) 12/25/2015   Positive RF, Elevated ESR, Positive CCP > 250     Family History  Problem Relation Age of Onset  . Rheum arthritis Mother   . Migraines Mother   . Hypertension Mother   . Colitis Mother   . Rheum arthritis Maternal Grandmother   . Rheum arthritis Maternal Aunt   . Migraines Sister   . Migraines Brother   . Sickle cell anemia Daughter   . Bipolar disorder Daughter   . Anxiety disorder Daughter   . Depression Daughter   . Migraines Daughter   . Arrhythmia Daughter   . Migraines Son   . ADD / ADHD Son   . Seizures Son   . Arrhythmia Son    Past Surgical History:  Procedure Laterality Date  . CESAREAN SECTION    . TUBAL LIGATION     Social History   Social History Narrative  . Not on file   Immunization History  Administered Date(s) Administered  . PFIZER(Purple Top)SARS-COV-2 Vaccination 01/05/2020, 01/26/2020     Objective: Vital Signs: There were no vitals taken for this visit.   Physical Exam   Musculoskeletal Exam: ***  CDAI Exam: CDAI Score: -- Patient Global: --; Provider Global: -- Swollen: --; Tender: --  Joint Exam 09/25/2021   No joint exam has been documented for this visit   There is currently no information documented on the homunculus. Go to the Rheumatology activity and complete the homunculus joint exam.  Investigation: No additional findings.  Imaging: No results found.  Recent Labs: Lab Results  Component Value Date   WBC 10.3 03/24/2021   HGB 14.5  03/24/2021   PLT 285 03/24/2021   NA 136 03/24/2021   K 3.7 03/24/2021   CL 106 03/24/2021   CO2 20 (L) 03/24/2021   GLUCOSE 119 (H) 03/24/2021   BUN 14 03/24/2021   CREATININE 0.74 03/24/2021   BILITOT 0.6 03/24/2021   ALKPHOS 43 03/24/2021   AST 18 03/24/2021   ALT 24 03/24/2021   PROT 7.6 03/24/2021   ALBUMIN 3.6 03/24/2021   CALCIUM 9.1 03/24/2021   GFRAA 90 07/03/2020   QFTBGOLDPLUS NEGATIVE 02/05/2021    Speciality Comments: PLQ Eye Exam: WNL 03/24/17 @ [facility],PLQ-diarrhea Prior therapy: Humira (facial rash) Humira & Enbrel (stopped due to waning response) - Orencia (injection site reaction) one dose on 02/19/21 -- Simponi started 03/06/21  Procedures:  No procedures performed Allergies: Doxycycline, Fluoxetine, Hydrocodone, Adalimumab, Hydrochlorothiazide, Hydrocodone-acetaminophen, Hydroxychloroquine, Other, and Vicodin [hydrocodone-acetaminophen]   Assessment / Plan:     Visit Diagnoses: No diagnosis found.  Orders: No orders of the defined types were placed in this encounter.  No orders of the defined types were placed in this encounter.   Face-to-face time spent with patient was *** minutes. Greater than 50% of time was spent in counseling and coordination of care.  Follow-Up Instructions: No follow-ups on file.   Earnestine Mealing, CMA  Note - This record has been created using Editor, commissioning.  Chart creation errors have been sought, but may not always  have been located. Such creation errors do not reflect on  the standard of medical care.

## 2021-09-25 ENCOUNTER — Ambulatory Visit: Payer: Medicare Other | Attending: Physician Assistant | Admitting: Physician Assistant

## 2021-09-25 ENCOUNTER — Other Ambulatory Visit: Payer: Self-pay | Admitting: Physician Assistant

## 2021-09-25 ENCOUNTER — Encounter: Payer: Self-pay | Admitting: Physician Assistant

## 2021-09-25 ENCOUNTER — Other Ambulatory Visit (HOSPITAL_COMMUNITY): Payer: Self-pay

## 2021-09-25 VITALS — BP 149/93 | HR 78 | Resp 17 | Ht 63.0 in | Wt 186.4 lb

## 2021-09-25 DIAGNOSIS — F17210 Nicotine dependence, cigarettes, uncomplicated: Secondary | ICD-10-CM

## 2021-09-25 DIAGNOSIS — M19041 Primary osteoarthritis, right hand: Secondary | ICD-10-CM | POA: Diagnosis not present

## 2021-09-25 DIAGNOSIS — M25512 Pain in left shoulder: Secondary | ICD-10-CM | POA: Diagnosis not present

## 2021-09-25 DIAGNOSIS — Z8719 Personal history of other diseases of the digestive system: Secondary | ICD-10-CM

## 2021-09-25 DIAGNOSIS — M0579 Rheumatoid arthritis with rheumatoid factor of multiple sites without organ or systems involvement: Secondary | ICD-10-CM | POA: Diagnosis not present

## 2021-09-25 DIAGNOSIS — M62838 Other muscle spasm: Secondary | ICD-10-CM

## 2021-09-25 DIAGNOSIS — G8929 Other chronic pain: Secondary | ICD-10-CM

## 2021-09-25 DIAGNOSIS — M2242 Chondromalacia patellae, left knee: Secondary | ICD-10-CM

## 2021-09-25 DIAGNOSIS — Z8669 Personal history of other diseases of the nervous system and sense organs: Secondary | ICD-10-CM

## 2021-09-25 DIAGNOSIS — R911 Solitary pulmonary nodule: Secondary | ICD-10-CM

## 2021-09-25 DIAGNOSIS — M19072 Primary osteoarthritis, left ankle and foot: Secondary | ICD-10-CM

## 2021-09-25 DIAGNOSIS — M170 Bilateral primary osteoarthritis of knee: Secondary | ICD-10-CM

## 2021-09-25 DIAGNOSIS — M2241 Chondromalacia patellae, right knee: Secondary | ICD-10-CM

## 2021-09-25 DIAGNOSIS — M5030 Other cervical disc degeneration, unspecified cervical region: Secondary | ICD-10-CM

## 2021-09-25 DIAGNOSIS — M19071 Primary osteoarthritis, right ankle and foot: Secondary | ICD-10-CM

## 2021-09-25 DIAGNOSIS — M797 Fibromyalgia: Secondary | ICD-10-CM

## 2021-09-25 DIAGNOSIS — I10 Essential (primary) hypertension: Secondary | ICD-10-CM

## 2021-09-25 DIAGNOSIS — F3181 Bipolar II disorder: Secondary | ICD-10-CM

## 2021-09-25 DIAGNOSIS — Z79899 Other long term (current) drug therapy: Secondary | ICD-10-CM

## 2021-09-25 DIAGNOSIS — M545 Low back pain, unspecified: Secondary | ICD-10-CM

## 2021-09-25 DIAGNOSIS — M19042 Primary osteoarthritis, left hand: Secondary | ICD-10-CM

## 2021-09-25 MED ORDER — PREDNISONE 5 MG PO TABS: ORAL_TABLET | ORAL | 0 refills | Status: DC

## 2021-09-25 NOTE — Patient Instructions (Signed)
Standing Labs We placed an order today for your standing lab work.   Please have your standing labs drawn in November and every 3 months   If possible, please have your labs drawn 2 weeks prior to your appointment so that the provider can discuss your results at your appointment.  Please note that you may see your imaging and lab results in [place] before we have reviewed them. We may be awaiting multiple results to interpret others before contacting you. Please allow our office up to 72 hours to thoroughly review all of the results before contacting the office for clarification of your results.  We have open lab daily: Monday through Thursday from 1:30-4:30 PM and Friday from 1:30-4:00 PM at the office of Dr. Bo Merino, [facility].   Please be advised, all patients with office appointments requiring lab work will take precedent over walk-in lab work.  If possible, please come for your lab work on Monday and Friday afternoons, as you may experience shorter wait times. The office is located at [address] [place], [place], [place] [address] No appointment is necessary.   Labs are drawn by [facility]. Please bring your co-pay at the time of your lab draw.  You may receive a bill from [facility] for your lab work.  Please note if you are on Hydroxychloroquine and and an order has been placed for a Hydroxychloroquine level, you will need to have it drawn 4 hours or more after your last dose.  If you wish to have your labs drawn at another location, please call the office 24 hours in advance to send orders.  If you have any questions regarding directions or hours of operation,  please call [phone].   As a reminder, please drink plenty of water prior to coming for your lab work. Thanks!   If you have signs or symptoms of an infection or start antibiotics: First, call your PCP for workup of your infection. Hold your medication through the infection, until you complete  your antibiotics, and until symptoms resolve if you take the following: Injectable medication (Actemra, Benlysta, Cimzia, Cosentyx, Enbrel, Humira, Kevzara, Orencia, Remicade, Simponi, Stelara, Taltz, Tremfya) Methotrexate Leflunomide (Arava) Mycophenolate (Cellcept) Morrie Sheldon, Olumiant, or Rinvoq

## 2021-09-25 NOTE — Progress Notes (Signed)
Absolute eosinophils are elevated.  She currently is experiencing congestion and is taking a course of amoxicillin.  She has an upcoming appointment on 10/01/21 with her PCP to determine if the infection has cleared.  She may require an evaluation by an allergist if her symptoms persist or worsen since these symptoms have been recurrent and her eosinophils are elevated.

## 2021-09-26 LAB — COMPLETE METABOLIC PANEL WITH GFR
AG Ratio: 1.1 (calc) (ref 1.0–2.5)
ALT: 35 U/L — ABNORMAL HIGH (ref 6–29)
AST: 25 U/L (ref 10–30)
Albumin: 3.9 g/dL (ref 3.6–5.1)
Alkaline phosphatase (APISO): 51 U/L (ref 31–125)
BUN: 10 mg/dL (ref 7–25)
CO2: 24 mmol/L (ref 20–32)
Calcium: 9.4 mg/dL (ref 8.6–10.2)
Chloride: 105 mmol/L (ref 98–110)
Creat: 0.89 mg/dL (ref 0.50–0.99)
Globulin: 3.5 g/dL (calc) (ref 1.9–3.7)
Glucose, Bld: 97 mg/dL (ref 65–99)
Potassium: 4.4 mmol/L (ref 3.5–5.3)
Sodium: 137 mmol/L (ref 135–146)
Total Bilirubin: 0.3 mg/dL (ref 0.2–1.2)
Total Protein: 7.4 g/dL (ref 6.1–8.1)
eGFR: 83 mL/min/{1.73_m2} (ref >=60)

## 2021-09-26 LAB — CBC WITH DIFFERENTIAL/PLATELET
Absolute Monocytes: 504 cells/uL (ref 200–950)
Basophils Absolute: 44 cells/uL (ref 0–200)
Basophils Relative: 0.6 %
Eosinophils Absolute: 832 cells/uL — ABNORMAL HIGH (ref 15–500)
Eosinophils Relative: 11.4 %
HCT: 41.5 % (ref 35.0–45.0)
Hemoglobin: 14.3 g/dL (ref 11.7–15.5)
Lymphs Abs: 2913 cells/uL (ref 850–3900)
MCH: 30.4 pg (ref 27.0–33.0)
MCHC: 34.5 g/dL (ref 32.0–36.0)
MCV: 88.3 fL (ref 80.0–100.0)
MPV: 9.8 fL (ref 7.5–12.5)
Monocytes Relative: 6.9 %
Neutro Abs: 3008 cells/uL (ref 1500–7800)
Neutrophils Relative %: 41.2 %
Platelets: 324 10*3/uL (ref 140–400)
RBC: 4.7 10*6/uL (ref 3.80–5.10)
RDW: 13.6 % (ref 11.0–15.0)
Total Lymphocyte: 39.9 %
WBC: 7.3 10*3/uL (ref 3.8–10.8)

## 2021-09-26 NOTE — Progress Notes (Signed)
ALT is mildly elevated, most likely due to use of Lipitor.  Please forward results to PCP.

## 2021-10-01 ENCOUNTER — Ambulatory Visit: Payer: Medicare Other | Admitting: Physician Assistant

## 2021-10-02 ENCOUNTER — Other Ambulatory Visit: Payer: Self-pay | Admitting: Physician Assistant

## 2021-10-02 NOTE — Telephone Encounter (Signed)
Next Visit: 11/05/2021  Last Visit: 09/25/2021  Last Fill: 01/28/2021  DX: Rheumatoid arthritis involving multiple sites with positive rheumatoid factor  Current Dose per office note 09/25/2021: methotrexate 0.8 mL sq weekly  Labs: 09/25/2021 Absolute eosinophils are elevated. ALT is mildly elevated, most likely due to use of Lipitor.    Okay to refill MTX?

## 2021-10-08 ENCOUNTER — Other Ambulatory Visit (HOSPITAL_COMMUNITY): Payer: Self-pay

## 2021-10-22 NOTE — Progress Notes (Signed)
Office Visit Note  Patient: Elizabeth Pope             Date of Birth: [birth year]           MRN: [id number]             PCP: Kristie Cowman, MD Referring: Kristie Cowman, MD Visit Date: 11/05/2021 Occupation: @GUAROCC @  Subjective:  Discuss restarting medications  History of Present Illness: Yerania Chamorro is a 41 y.o. female with history of seropositive rheumatoid arthritis and osteoarthritis.  She is not currently taking any immunosuppressive agents.  She has been off of Simponi and methotrexate since June 2023 due to recurrent infections.  She is not currently taking any antibiotics, antivirals, or antifungals.  Patient reports that she plans on reestablishing care with her allergist to get back on allergy shots. She reports that since being off of Simponi and methotrexate she has been experiencing intermittent flares.  She is currently having pain and swelling in her right knee and discomfort in her right ankle joint.  She has stiffness in both hands but denies any joint swelling in her hands currently.  She states the prednisone taper prescribed at her last office visit on 09/25/2021 alleviated her symptoms.  She has been taking Tylenol as needed for symptomatic relief.      Activities of Daily Living:  Patient reports morning stiffness for 20-30 minutes.   Patient Reports nocturnal pain.  Difficulty dressing/grooming: Denies Difficulty climbing stairs: Reports Difficulty getting out of chair: Reports Difficulty using hands for taps, buttons, cutlery, and/or writing: Reports  Review of Systems  Constitutional:  Positive for fatigue.  HENT:  Positive for mouth dryness. Negative for mouth sores.   Eyes:  Positive for dryness.  Respiratory:  Positive for shortness of breath.   Cardiovascular:  Negative for chest pain and palpitations.  Gastrointestinal:  Negative for blood in stool, constipation and diarrhea.  Endocrine: Negative for increased urination.   Genitourinary:  Negative for involuntary urination.  Musculoskeletal:  Positive for joint pain, joint pain, myalgias, morning stiffness, muscle tenderness and myalgias. Negative for gait problem, joint swelling and muscle weakness.  Skin:  Negative for color change, rash, hair loss and sensitivity to sunlight.  Allergic/Immunologic: Negative for susceptible to infections.  Neurological:  Positive for dizziness and headaches.  Hematological:  Negative for swollen glands.  Psychiatric/Behavioral:  Positive for sleep disturbance. Negative for depressed mood. The patient is not nervous/anxious.     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   Pelvic pain 06/02/2017   Atypical squamous cell changes of undetermined significance (ASCUS) on cervical cytology with negative high risk human papilloma virus (HPV) test result 02/03/2017   Encounter for IUD insertion 01/28/2017   Uterine fibroid 01/21/2017   H/O tubal ligation 06/24/2016   High risk medication use 01/07/2016   GERD (gastroesophageal reflux disease) 12/25/2015   Migraines 12/25/2015   Hypertension 12/25/2015   RA (rheumatoid arthritis) ([place]) 12/25/2015   DDD (degenerative disc disease), cervical 12/25/2015   Osteoarthritis of both hands 12/25/2015   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/2015   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   TB lung, latent 05/14/2015   Bipolar 2 disorder ([place]) 05/14/2015   Cigarette smoker 05/05/2015   Chest pain 05/05/2015   Solitary pulmonary nodule 05/04/2015    Past Medical History:  Diagnosis Date   Anxiety    Bipolar 1 disorder (  [place])    DDD (degenerative disc disease), cervical 12/25/2015   Depression    GERD (gastroesophageal reflux disease) 12/25/2015   Hypertension 12/25/2015   Migraine    Migraines 12/25/2015   OCD (obsessive compulsive disorder)    Osteoarthritis  of both feet 12/25/2015   mild   Osteoarthritis of both hands 12/25/2015   PTSD (post-traumatic stress disorder)    RA (rheumatoid arthritis) ([place]) 12/25/2015   Positive RF, Elevated ESR, Positive CCP > 250     Family History  Problem Relation Age of Onset   Rheum arthritis Mother    Migraines Mother    Hypertension Mother    Colitis Mother    Rheum arthritis Maternal Grandmother    Rheum arthritis Maternal Aunt    Migraines Sister    Migraines Brother    Sickle cell anemia Daughter    Bipolar disorder Daughter    Anxiety disorder Daughter    Depression Daughter    Migraines Daughter    Arrhythmia Daughter    Migraines Son    ADD / ADHD Son    Seizures Son    Arrhythmia Son    Past Surgical History:  Procedure Laterality Date   CESAREAN SECTION     TUBAL LIGATION     Social History   Social History Narrative   Not on file   Immunization History  Administered Date(s) Administered   PFIZER(Purple Top)SARS-COV-2 Vaccination 01/05/2020, 01/26/2020     Objective: Vital Signs: BP (!) 144/91 (BP Location: Left Arm, Patient Position: Sitting, Cuff Size: Normal)   Pulse 83   Resp 15   Ht 5' 3"  (1.6 m)   Wt 187 lb 12.8 oz (85.2 kg)   BMI 33.27 kg/m    Physical Exam Vitals and nursing note reviewed.  Constitutional:      Appearance: She is well-developed.  HENT:     Head: Normocephalic and atraumatic.  Eyes:     Conjunctiva/sclera: Conjunctivae normal.  Cardiovascular:     Rate and Rhythm: Normal rate and regular rhythm.     Heart sounds: Normal heart sounds.  Pulmonary:     Effort: Pulmonary effort is normal.     Breath sounds: Normal breath sounds.  Abdominal:     General: Bowel sounds are normal.     Palpations: Abdomen is soft.  Musculoskeletal:     Cervical back: Normal range of motion.  Skin:    General: Skin is warm and dry.     Capillary Refill: Capillary refill takes less than 2 seconds.  Neurological:     Mental Status: She is alert and  oriented to person, place, and time.  Psychiatric:        Behavior: Behavior normal.      Musculoskeletal Exam: C-spine has good range of motion with no discomfort.  Discomfort with lumbar range of motion.  Shoulder joints, elbow joints, wrist joints, MCPs, PIPs, DIPs have good range of motion with no synovitis.  Complete fist formation noted bilaterally.  Hip joints have good range of motion with no groin pain.  Painful range of motion of the right knee with warmth and swelling.  Left knee joint has good range of motion with no warmth or effusion.  Ankle joints have good range of motion with tenderness over the right ankle.  CDAI Exam: CDAI Score: 3.4  Patient Global: 7 mm; Provider Global: 7 mm Swollen: 1 ; Tender: 3  Joint Exam 11/05/2021      Right  Left  Knee  Swollen Tender  Ankle   Tender   Tender     Investigation: No additional findings.  Imaging: No results found.  Recent Labs: Lab Results  Component Value Date   WBC 7.3 09/25/2021   HGB 14.3 09/25/2021   PLT 324 09/25/2021   NA 137 09/25/2021   K 4.4 09/25/2021   CL 105 09/25/2021   CO2 24 09/25/2021   GLUCOSE 97 09/25/2021   BUN 10 09/25/2021   CREATININE 0.89 09/25/2021   BILITOT 0.3 09/25/2021   ALKPHOS 43 03/24/2021   AST 25 09/25/2021   ALT 35 (H) 09/25/2021   PROT 7.4 09/25/2021   ALBUMIN 3.6 03/24/2021   CALCIUM 9.4 09/25/2021   GFRAA 90 07/03/2020   QFTBGOLDPLUS NEGATIVE 02/05/2021    Speciality Comments: PLQ Eye Exam: WNL 03/24/17 @ [facility],PLQ-diarrhea Prior therapy: Humira (facial rash) Humira & Enbrel (stopped due to waning response) - Orencia (injection site reaction) one dose on 02/19/21 -- Simponi started 03/06/21  Procedures:  No procedures performed Allergies: Cefuroxime, Doxycycline, Fluoxetine, Hydrocodone, Adalimumab, Hydrochlorothiazide, Hydrocodone-acetaminophen, Hydroxychloroquine, Other, and Vicodin [hydrocodone-acetaminophen]   Assessment / Plan:     Visit  Diagnoses: Rheumatoid arthritis involving multiple sites with positive rheumatoid factor [facility]): Patient presents today with increased pain and stiffness in her right knee and right ankle joint.  Warmth of the right knee noted on exam. She has continued to have recurrent flares due to an ongoing gap in therapy.  She has been off of Simponi and methotrexate since June 2023 due to recurrent infections.  She is no longer taking any antibiotics, antivirals, or antifungals.  Her upper respiratory tract infection has completely cleared. Discussed the risk for immunosuppression on combination therapy.  The patient previously had an inadequate response to methotrexate as monotherapy.  She also had an inadequate response to Enbrel and Humira in the past and intolerance to Orencia and Plaquenil previously.  Different treatment options were discussed today.  Discussed restarting Simponi as monotherapy and to try spacing the dose to every 5 weeks to minimize the risk for immunosuppression.  She was in agreement.  Discussed that she will need to restart on Simponi in the office.  She will follow-up in the office in 2-3 months or sooner if needed.  High risk medication use - Simponi 50 mg sq injections every month.  Initiated simponi on 03/06/21. - Plan: QuantiFERON-TB Gold Plus CBC and CMP updated on 09/25/21.  She will be due to update lab work 1 month and every 3 months after restarting on Simponi. TB gold negative on 02/05/21.  Future order for TB Gold placed today. Discussed the importance of holding Simponi if she develops signs or symptoms of infection and to resume once the infection is completely cleared. Previous therapy includes: Methotrexate-discontinued due to recurrent infections in combination with Simponi.   Injection site reaction with Orencia in the past. Plaquenil discontinued due to diarrhea.   Inadequate response to Humira and Enbrel. Discussed the importance of being up to date with necessary  vaccinations.  Flu annually Covid-19 booster Td/Tdap (tetanus, diphtheria, pertussis) every 10 years Pneumonia (Prevnar 15 then Pneumovax 23 at least 1 year apart.  Alternatively, can take Prevnar 20 without needing additional dose) Shingrix: 2 doses from 4 weeks to 6 months apart  Screening for tuberculosis - Future order for TB gold placed today. Plan: QuantiFERON-TB Gold Plus  Chronic left shoulder pain: Resolved.  Primary osteoarthritis of both hands: Ultrasound of both hands on 02/07/2021 which revealed synovitis. She continues to experience intermittent stiffness in  both hands.  No inflammation noted on examination today.   Primary osteoarthritis of both knees: She presents today with increased pain and stiffness in the right knee joint which started this morning.  She has some warmth and swelling in the right knee on examination today.  She will be restarting on Simponi as discussed above.  Chondromalacia of both patellae: Good range of motion of both knee joints with discomfort in her right knee.  Primary osteoarthritis of both feet: She presents today with increased pain and stiffness in her right ankle joint.  Trapezius muscle spasm: Intermittent  DDD (degenerative disc disease), cervical: C-spine has good range of motion with no discomfort.  Chronic midline low back pain without sciatica: Continues to have ongoing discomfort in her lower back.  Fibromyalgia: She experiences intermittent myalgias and muscle tenderness due to fibromyalgia.  Discussed the importance of regular exercise and good sleep hygiene.  Other medical conditions are listed as follows:   History of gastroesophageal reflux (GERD)  Essential hypertension  Bipolar 2 disorder ([facility])  History of migraine  Solitary pulmonary nodule  Cigarette smoker  TB lung, latent   Orders: Orders Placed This Encounter  Procedures   QuantiFERON-TB Gold Plus   No orders of the defined types were placed in this  encounter.    Follow-Up Instructions: Return in about 3 months (around 02/04/2022) for Rheumatoid arthritis.   Ofilia Neas, PA-C  Note - This record has been created using Dragon software.  Chart creation errors have been sought, but may not always  have been located. Such creation errors do not reflect on  the standard of medical care.

## 2021-10-24 ENCOUNTER — Other Ambulatory Visit: Payer: Self-pay | Admitting: Physician Assistant

## 2021-11-04 ENCOUNTER — Other Ambulatory Visit (HOSPITAL_COMMUNITY): Payer: Self-pay

## 2021-11-05 ENCOUNTER — Other Ambulatory Visit (HOSPITAL_COMMUNITY): Payer: Self-pay

## 2021-11-05 ENCOUNTER — Telehealth: Payer: Self-pay | Admitting: Pharmacist

## 2021-11-05 ENCOUNTER — Ambulatory Visit: Payer: Medicare Other | Attending: Physician Assistant | Admitting: Physician Assistant

## 2021-11-05 ENCOUNTER — Encounter: Payer: Self-pay | Admitting: Physician Assistant

## 2021-11-05 VITALS — BP 144/91 | HR 83 | Resp 15 | Ht 63.0 in | Wt 187.8 lb

## 2021-11-05 DIAGNOSIS — M5030 Other cervical disc degeneration, unspecified cervical region: Secondary | ICD-10-CM

## 2021-11-05 DIAGNOSIS — F3181 Bipolar II disorder: Secondary | ICD-10-CM

## 2021-11-05 DIAGNOSIS — M25512 Pain in left shoulder: Secondary | ICD-10-CM

## 2021-11-05 DIAGNOSIS — M545 Low back pain, unspecified: Secondary | ICD-10-CM

## 2021-11-05 DIAGNOSIS — Z111 Encounter for screening for respiratory tuberculosis: Secondary | ICD-10-CM

## 2021-11-05 DIAGNOSIS — Z8669 Personal history of other diseases of the nervous system and sense organs: Secondary | ICD-10-CM

## 2021-11-05 DIAGNOSIS — M2242 Chondromalacia patellae, left knee: Secondary | ICD-10-CM

## 2021-11-05 DIAGNOSIS — Z79899 Other long term (current) drug therapy: Secondary | ICD-10-CM | POA: Diagnosis not present

## 2021-11-05 DIAGNOSIS — M0579 Rheumatoid arthritis with rheumatoid factor of multiple sites without organ or systems involvement: Secondary | ICD-10-CM | POA: Diagnosis not present

## 2021-11-05 DIAGNOSIS — M2241 Chondromalacia patellae, right knee: Secondary | ICD-10-CM

## 2021-11-05 DIAGNOSIS — F17210 Nicotine dependence, cigarettes, uncomplicated: Secondary | ICD-10-CM

## 2021-11-05 DIAGNOSIS — M19041 Primary osteoarthritis, right hand: Secondary | ICD-10-CM

## 2021-11-05 DIAGNOSIS — M797 Fibromyalgia: Secondary | ICD-10-CM

## 2021-11-05 DIAGNOSIS — M170 Bilateral primary osteoarthritis of knee: Secondary | ICD-10-CM

## 2021-11-05 DIAGNOSIS — I10 Essential (primary) hypertension: Secondary | ICD-10-CM

## 2021-11-05 DIAGNOSIS — M19071 Primary osteoarthritis, right ankle and foot: Secondary | ICD-10-CM

## 2021-11-05 DIAGNOSIS — M62838 Other muscle spasm: Secondary | ICD-10-CM

## 2021-11-05 DIAGNOSIS — G8929 Other chronic pain: Secondary | ICD-10-CM

## 2021-11-05 DIAGNOSIS — Z227 Latent tuberculosis: Secondary | ICD-10-CM

## 2021-11-05 DIAGNOSIS — Z8719 Personal history of other diseases of the digestive system: Secondary | ICD-10-CM

## 2021-11-05 DIAGNOSIS — M19072 Primary osteoarthritis, left ankle and foot: Secondary | ICD-10-CM

## 2021-11-05 DIAGNOSIS — R911 Solitary pulmonary nodule: Secondary | ICD-10-CM

## 2021-11-05 DIAGNOSIS — M19042 Primary osteoarthritis, left hand: Secondary | ICD-10-CM

## 2021-11-05 NOTE — Telephone Encounter (Addendum)
Please start Simponi SQ autoinjector BIV. SUBMIT AS URGENT if possible   Dose: '50mg'$  SQ every 30 days   Dx: Rheumatoid arthritis (M05.9)   Previously tried therapies: Enbrel - waning clinical response Humira - facial rash Orencia - injection site reaction, patient had ED Visit for management   Therapies patient unable to try: JAK inhibitors (Rinvoq, Roma Kayser) - current smoker   Currently taking methotrexate   Knox Saliva, PharmD, MPH, BCPS Clinical Pharmacist (Rheumatology and Pulmonology)  ----- Message from [place] sent at 11/05/2021  2:46 PM EDT ----- Patient will be restarting Simponi per Hazel Sams, PA-C. Please call to schedule patient a new start visit for this week per Lovena Le. Thanks!

## 2021-11-06 ENCOUNTER — Other Ambulatory Visit (HOSPITAL_COMMUNITY): Payer: Self-pay

## 2021-11-06 NOTE — Telephone Encounter (Signed)
Current prior authorization for Simponi '50mg'$  autoinjcetor is active through 02/23/22.  She will take Simponi '50mg'$  SQ every 5 weeks (due to recurrent infections) as monotherapy.  Scheduled to restart Simponi on 11/07/21. Will use sample and rx will be sent to [facility] after visit  Knox Saliva, PharmD, MPH, BCPS, CPP Clinical Pharmacist (Rheumatology and Pulmonology)

## 2021-11-07 ENCOUNTER — Ambulatory Visit: Payer: Medicare Other | Attending: Rheumatology | Admitting: Pharmacist

## 2021-11-07 ENCOUNTER — Other Ambulatory Visit (HOSPITAL_COMMUNITY): Payer: Self-pay

## 2021-11-07 DIAGNOSIS — Z79899 Other long term (current) drug therapy: Secondary | ICD-10-CM

## 2021-11-07 DIAGNOSIS — M0579 Rheumatoid arthritis with rheumatoid factor of multiple sites without organ or systems involvement: Secondary | ICD-10-CM

## 2021-11-07 DIAGNOSIS — Z7189 Other specified counseling: Secondary | ICD-10-CM

## 2021-11-07 MED ORDER — SIMPONI 50 MG/0.5ML ~~LOC~~ SOAJ
SUBCUTANEOUS | 1 refills | Status: DC
  Filled 2021-11-07: qty 0.5, fill #0
  Filled 2021-12-03: qty 0.5, 35d supply, fill #0
  Filled 2021-12-31: qty 0.5, 35d supply, fill #1

## 2021-11-07 NOTE — Progress Notes (Signed)
Pharmacy Note  Subjective:   Patient presents to clinic today to restart Simponi for rheumatoid arthritis. Patient originally started Simponi on 03/06/21. She was on and off treatment due to recurrent infections.   Patient running a fever or have signs/symptoms of infection? No  Patient currently on antibiotics for the treatment of infection? No  Patient have any upcoming invasive procedures/surgeries? No  Objective: CMP     Component Value Date/Time   NA 137 09/25/2021 0906   NA 139 06/24/2016 1104   K 4.4 09/25/2021 0906   CL 105 09/25/2021 0906   CO2 24 09/25/2021 0906   GLUCOSE 97 09/25/2021 0906   BUN 10 09/25/2021 0906   BUN 14 06/24/2016 1104   CREATININE 0.89 09/25/2021 0906   CALCIUM 9.4 09/25/2021 0906   PROT 7.4 09/25/2021 0906   PROT 7.3 06/24/2016 1104   ALBUMIN 3.6 03/24/2021 1454   ALBUMIN 3.9 06/24/2016 1104   AST 25 09/25/2021 0906   ALT 35 (H) 09/25/2021 0906   ALKPHOS 43 03/24/2021 1454   BILITOT 0.3 09/25/2021 0906   BILITOT 0.2 06/24/2016 1104   GFRNONAA >60 03/24/2021 1454   GFRNONAA 78 07/03/2020 1414   GFRAA 90 07/03/2020 1414    CBC    Component Value Date/Time   WBC 7.3 09/25/2021 0906   RBC 4.70 09/25/2021 0906   HGB 14.3 09/25/2021 0906   HGB 11.8 06/24/2016 1104   HCT 41.5 09/25/2021 0906   HCT 34.1 06/24/2016 1104   PLT 324 09/25/2021 0906   PLT 342 06/24/2016 1104   MCV 88.3 09/25/2021 0906   MCV 85 06/24/2016 1104   MCH 30.4 09/25/2021 0906   MCHC 34.5 09/25/2021 0906   RDW 13.6 09/25/2021 0906   RDW 15.2 06/24/2016 1104   LYMPHSABS 2,913 09/25/2021 0906   LYMPHSABS 2.0 06/24/2016 1104   MONOABS 0.5 03/24/2021 1454   EOSABS 832 (H) 09/25/2021 0906   EOSABS 0.4 06/24/2016 1104   BASOSABS 44 09/25/2021 0906   BASOSABS 0.0 06/24/2016 1104    Baseline Immunosuppressant Therapy Labs TB GOLD    Latest Ref Rng & Units 02/05/2021    3:16 PM  Quantiferon TB Gold  Quantiferon TB Gold Plus NEGATIVE NEGATIVE    Hepatitis  Panel    Latest Ref Rng & Units 03/12/2018   11:16 AM  Hepatitis  Hep B Surface Ag Negative Negative    HIV Lab Results  Component Value Date   HIV Non Reactive 03/12/2018   HIV Non Reactive 06/24/2016   Immunoglobulins   SPEP    Latest Ref Rng & Units 09/25/2021    9:06 AM  Serum Protein Electrophoresis  Total Protein 6.1 - 8.1 g/dL 7.4    G6PD No results found for: "G6PDH" TPMT No results found for: "TPMT"   Chest x-ray: 03/24/21 - No active disease.  Assessment/Plan:  Reviewed importance of adherence to asthma treatment regimen, hypertension regimen, and sertraline. She will discuss at f/u appointment w PCP. She reports non-adherence is due to forgetfulness. She does pack monthly pill pack but still forgets. Her children remind her to take medications here and there but not routinely enough for her to be adherent  Reviewed importance of holding Simponi with signs/symptoms of an infections, if antibiotics are prescribed to treat an active infection, and with invasive procedures  Demonstrated proper injection technique with Simponi demo device  Patient able to demonstrate proper injection technique using the teach back method.  Patient self injected in the right upper thigh with:  Sample  Medication: Simponi '50mg'$ /ml SmartJect autoinjector NDC: [address]-11 Lot: [id number] Expiration: 06/2023  Patient tolerated well.  Observed for 30 mins in office for adverse reaction and none noted by provider or patient.   Patient is to return in 1 month for labs and 6-8 weeks for follow-up appointment.  Standing orders placed.   Simponi approved through insurance .   Rx sent to: [facility]: [phone] .  Patient provided with pharmacy phone number and advised that [facility] will call her to schedule the first shipment to her home. Subsequent refills will be coordinated by  Patient will continue Simponi '50mg'$  SQ every 5 weeks as monotherapy.  All  questions encouraged and answered.  Instructed patient to call with any further questions or concerns.  Knox Saliva, PharmD, MPH, BCPS, CPP Clinical Pharmacist (Rheumatology and Pulmonology)  11/07/2021 9:59 AM

## 2021-11-07 NOTE — Patient Instructions (Signed)
Your next [place] dose is due on 12/12/21, 01/16/22, and every 5 weeks thereafter  STOP methotrexate and folic acid  HOLD SIMPONI if you have signs or symptoms of an infection. You can resume once you feel better or back to your baseline. HOLD SIMPONI if you start antibiotics to treat an infection. HOLD SIMPONI around the time of surgery/procedures. Your surgeon will be able to provide recommendations on when to hold BEFORE and when you are cleared to [facility].  Pharmacy information: Your prescription will be shipped from [facility]. Their phone number is [phone] They will call you to schedule shipment and confirm address. They will mail your medication to your home.  Labs are due in 5 weeks then every 3 months. Lab hours are from Monday to Thursday 1:30-4:30pm and Friday 1:30-4pm. You do not need an appointment if you come for labs during these times.  How to manage an injection site reaction: Remember the 5 C's: COUNTER - leave on the counter at least 30 minutes but up to overnight to bring medication to room temperature. This may help prevent stinging COLD - place something cold (like an ice gel pack or cold water bottle) on the injection site just before cleansing with alcohol. This may help reduce pain CLARITIN - use Claritin (generic name is loratadine) for the first two weeks of treatment or the day of, the day before, and the day after injecting. This will help to minimize injection site reactions CORTISONE CREAM - apply if injection site is irritated and itching CALL ME - if injection site reaction is bigger than the size of your fist, looks infected, blisters, or if you develop hives

## 2021-11-12 ENCOUNTER — Other Ambulatory Visit (HOSPITAL_COMMUNITY): Payer: Self-pay

## 2021-11-12 NOTE — Progress Notes (Signed)
[facility] completed onboarding and scheduling on the same day the request was sent over. [facility] has been updated to reflect those changes.

## 2021-12-03 ENCOUNTER — Other Ambulatory Visit (HOSPITAL_COMMUNITY): Payer: Self-pay

## 2021-12-09 ENCOUNTER — Other Ambulatory Visit (HOSPITAL_COMMUNITY): Payer: Self-pay

## 2021-12-18 ENCOUNTER — Other Ambulatory Visit: Payer: Self-pay | Admitting: Rheumatology

## 2021-12-18 MED ORDER — PREDNISONE 5 MG PO TABS: ORAL_TABLET | ORAL | 0 refills | Status: DC

## 2021-12-18 NOTE — Telephone Encounter (Signed)
Left message to advise patient we are sending a prednisone prescription to the pharmacy. Advised patient to take prednisone the morning with food and avoid the use of NSAIDs.

## 2021-12-18 NOTE — Telephone Encounter (Signed)
Patient called the office stating she is having a flare of her RA and would like a refill of prednisone to be sent to [facility] [address].

## 2021-12-18 NOTE — Telephone Encounter (Signed)
Ok to send in prednisone 20 mg tapering by 5 mg every 4 days.  Take prednisone the morning with food and avoid the use of NSAIDs.

## 2021-12-18 NOTE — Telephone Encounter (Signed)
Patient states she is having trouble with her knees. Patient states she is having pain and denies swelling in her knees. Patient states she has trouble standing up from a sitting position due to her ankles. Patient states she has pain in her ankles but denies swelling in her ankles at this time. Patient states this had been happening every now and then but is now happening all the time. Patient recently started Simponi on 11/07/2021. Patient states she has had her second injection on 12/11/2021. Patient is requesting a prescription for prednisone. Please advise.

## 2021-12-31 ENCOUNTER — Other Ambulatory Visit (HOSPITAL_COMMUNITY): Payer: Self-pay

## 2022-01-01 ENCOUNTER — Other Ambulatory Visit (HOSPITAL_COMMUNITY): Payer: Self-pay

## 2022-01-06 ENCOUNTER — Other Ambulatory Visit (HOSPITAL_COMMUNITY): Payer: Self-pay

## 2022-01-22 ENCOUNTER — Telehealth: Payer: Self-pay | Admitting: *Deleted

## 2022-01-22 NOTE — Telephone Encounter (Signed)
Please clarify if she has missed any doses of simponi?  A prednisone taper was sent to the pharmacy on 12/18/21. If she has no joint swelling prednisone may not be appropriate at this time.  If she continues to have recurrent flares we will need to discuss other treatment options at her upcoming follow up visit.

## 2022-01-22 NOTE — Telephone Encounter (Signed)
Bil knee pain x 2 weeks, low back pain daily, no swelling, patient requesting prednisone, [facility], please advise.

## 2022-01-22 NOTE — Telephone Encounter (Signed)
I called patient, patient verbalized understanding, patient states she has been taking Simponi as prescribed.

## 2022-01-23 ENCOUNTER — Other Ambulatory Visit (HOSPITAL_COMMUNITY): Payer: Self-pay

## 2022-01-23 ENCOUNTER — Other Ambulatory Visit: Payer: Self-pay | Admitting: Physician Assistant

## 2022-01-23 DIAGNOSIS — Z79899 Other long term (current) drug therapy: Secondary | ICD-10-CM

## 2022-01-23 DIAGNOSIS — M0579 Rheumatoid arthritis with rheumatoid factor of multiple sites without organ or systems involvement: Secondary | ICD-10-CM

## 2022-01-23 MED ORDER — SIMPONI 50 MG/0.5ML ~~LOC~~ SOAJ
SUBCUTANEOUS | 0 refills | Status: DC
  Filled 2022-01-23 – 2022-02-05 (×4): qty 0.5, 35d supply, fill #0

## 2022-01-23 NOTE — Telephone Encounter (Signed)
Next Visit: 02/05/2022  Last Visit: 11/05/2021  Last Fill: 11/07/2021  [id number] arthritis involving multiple sites with positive rheumatoid factor   Current Dose per office note 11/05/2021: Simponi 50 mg sq injections every month   Labs: 09/25/2021 Absolute eosinophils are elevated. ALT is mildly elevated,   TB Gold: 02/05/2021   Patient to update labs at her appointment on 02/05/2022  Okay to refill Simponi?

## 2022-01-24 ENCOUNTER — Other Ambulatory Visit: Payer: Self-pay | Admitting: Physician Assistant

## 2022-01-24 NOTE — Progress Notes (Unsigned)
Office Visit Note  Patient: Elizabeth Pope             Date of Birth: 05-15-1980           MRN: [id number]             PCP: Kristie Cowman, MD Referring: Kristie Cowman, MD Visit Date: 02/05/2022 Occupation: _0 @  Subjective:  Pain in multiple joints   History of Present Illness: Elizabeth Pope is a 41 y.o. female with history of seropositive rheumatoid arthritis and osteoarthritis. Patient is currently on Simponi 50 mg sq injections every month. Simponi was initially started on 03/06/21.  She had several gaps in therapy due to recurrent infections.  She restarted Simponi on 11/07/2021.  She is tolerating Simponi without any side effects or injection site reactions.  She has not missed any doses of Simponi since reinitiating therapy.  Patient reports that she has been experiencing persistent pain in both hands and both knee joints.  She notices intermittent swelling in her hands and knees.  She is also been having increased discomfort at night.  She she has not been taking any over-the-counter products for pain relief.  She has found Tylenol and ibuprofen to be ineffective at managing her symptoms in the past.  She has been experiencing increased discomfort in her lower back and has difficulty performing household activities due to the severity of pain.  She is not currently experiencing any symptoms of radiculopathy.     Activities of Daily Living:  Patient reports morning stiffness for all day. Patient Reports nocturnal pain.  Difficulty dressing/grooming: Denies Difficulty climbing stairs: Reports Difficulty getting out of chair: Reports Difficulty using hands for taps, buttons, cutlery, and/or writing: Reports  Review of Systems  Constitutional:  Positive for fatigue.  HENT:  Positive for mouth dryness. Negative for mouth sores and nose dryness.   Eyes:  Positive for dryness. Negative for pain and visual disturbance.  Respiratory:  Negative for cough, hemoptysis  and difficulty breathing.   Cardiovascular:  Negative for chest pain, palpitations, hypertension and swelling in legs/feet.  Gastrointestinal:  Positive for diarrhea. Negative for blood in stool and constipation.  Endocrine: Negative for increased urination.  Genitourinary:  Negative for painful urination and involuntary urination.  Musculoskeletal:  Positive for joint pain, gait problem, joint pain, joint swelling and morning stiffness. Negative for myalgias, muscle weakness, muscle tenderness and myalgias.  Skin:  Positive for rash. Negative for color change, pallor, hair loss, nodules/bumps, skin tightness, ulcers and sensitivity to sunlight.  Allergic/Immunologic: Negative for susceptible to infections.  Neurological:  Negative for dizziness, numbness, headaches and weakness.  Hematological:  Negative for swollen glands.  Psychiatric/Behavioral:  Positive for depressed mood and sleep disturbance. The patient is nervous/anxious.     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   Pelvic pain 06/02/2017   Atypical squamous cell changes of undetermined significance (ASCUS) on cervical cytology with negative high risk human papilloma virus (HPV) test result 02/03/2017   Encounter for IUD insertion 01/28/2017   Uterine fibroid 01/21/2017   H/O tubal ligation 06/24/2016   High risk medication use 01/07/2016   GERD (gastroesophageal reflux disease) 12/25/2015   Migraines 12/25/2015   Hypertension 12/25/2015   RA (rheumatoid arthritis) ([place]) 12/25/2015   DDD (degenerative disc disease), cervical 12/25/2015   Osteoarthritis of both hands 12/25/2015   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/2015  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   TB lung, latent 05/14/2015   Bipolar 2 disorder ([place]) 05/14/2015   Cigarette smoker 05/05/2015   Chest pain 05/05/2015   Solitary pulmonary  nodule 05/04/2015    Past Medical History:  Diagnosis Date   Anxiety    Bipolar 1 disorder (HCC)    DDD (degenerative disc disease), cervical 12/25/2015   Depression    GERD (gastroesophageal reflux disease) 12/25/2015   Hypertension 12/25/2015   Migraine    Migraines 12/25/2015   OCD (obsessive compulsive disorder)    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/2015   mild   Osteoarthritis of both hands 12/25/2015   PTSD (post-traumatic stress disorder)    RA (rheumatoid arthritis) ([place]) 12/25/2015   Positive RF, Elevated ESR, Positive CCP > 250     Family History  Problem Relation Age of Onset   Rheum arthritis Mother    Migraines Mother    Hypertension Mother    Colitis Mother    Rheum arthritis Maternal Grandmother    Rheum arthritis Maternal Aunt    Migraines Sister    Migraines Brother    Sickle cell anemia Daughter    Bipolar disorder Daughter    Anxiety disorder Daughter    Depression Daughter    Migraines Daughter    Arrhythmia Daughter    Migraines Son    ADD / ADHD Son    Seizures Son    Arrhythmia Son    Past Surgical History:  Procedure Laterality Date   CESAREAN SECTION     TUBAL LIGATION     Social History   Social History Narrative   Not on file   Immunization History  Administered Date(s) Administered   PFIZER(Purple Top)SARS-COV-2 Vaccination 01/05/2020, 01/26/2020     Objective: Vital Signs: BP 133/87 (BP Location: Right Arm, Patient Position: Sitting, Cuff Size: Normal)   Pulse 77   Resp 15   Ht _0  (1.626 m)   Wt 190 lb 9.6 oz (86.5 kg)   BMI 32.72 kg/m    Physical Exam Vitals and nursing note reviewed.  Constitutional:      Appearance: She is well-developed.  HENT:     Head: Normocephalic and atraumatic.  Eyes:     Conjunctiva/sclera: Conjunctivae normal.  Cardiovascular:     Rate and Rhythm: Normal rate and regular rhythm.     Heart sounds: Normal heart sounds.  Pulmonary:     Effort: Pulmonary effort is normal.     Breath  sounds: Normal breath sounds.  Abdominal:     General: Bowel sounds are normal.     Palpations: Abdomen is soft.  Musculoskeletal:     Cervical back: Normal range of motion.  Skin:    General: Skin is warm and dry.     Capillary Refill: Capillary refill takes less than 2 seconds.  Neurological:     Mental Status: She is alert and oriented to person, place, and time.  Psychiatric:        Behavior: Behavior normal.      Musculoskeletal Exam: C-spine has good range of motion with no discomfort.  Painful range of motion of the lumbar spine.  Tenderness in the lower lumbar region midline.  No SI joint tenderness upon palpation.  Left shoulder has limited range of motion.  Elbow joints have good range of motion with no tenderness or synovitis.  Wrist joints have good range of motion with no tenderness or synovitis.  Tenderness over bilateral second through fifth PIP joints.  Tenderness over  the left first MCP and PIP joint.  Hip joints have good range of motion with no groin pain.  Painful range of motion of both knee joints with warmth but no effusion.  Ankle joints have good range of motion with no tenderness or synovitis.  CDAI Exam: CDAI Score: -- Patient Global: 8 mm; Provider Global: 5 mm Swollen: --; Tender: -- Joint Exam 02/05/2022   No joint exam has been documented for this visit   There is currently no information documented on the homunculus. Go to the Rheumatology activity and complete the homunculus joint exam.  Investigation: No additional findings.  Imaging: No results found.  Recent Labs: Lab Results  Component Value Date   WBC 7.3 09/25/2021   HGB 14.3 09/25/2021   PLT 324 09/25/2021   NA 137 09/25/2021   K 4.4 09/25/2021   CL 105 09/25/2021   CO2 24 09/25/2021   GLUCOSE 97 09/25/2021   BUN 10 09/25/2021   CREATININE 0.89 09/25/2021   BILITOT 0.3 09/25/2021   ALKPHOS 43 03/24/2021   AST 25 09/25/2021   ALT 35 (H) 09/25/2021   PROT 7.4 09/25/2021    ALBUMIN 3.6 03/24/2021   CALCIUM 9.4 09/25/2021   GFRAA 90 07/03/2020   QFTBGOLDPLUS NEGATIVE 02/05/2021    Speciality Comments: PLQ Eye Exam: WNL 03/24/17 @ [facility],PLQ-diarrhea Prior therapy: Humira (facial rash) Humira & Enbrel (stopped due to waning response) - Orencia (injection site reaction) one dose on 02/19/21 -- Simponi started 03/06/21  Procedures:  No procedures performed Allergies: Cefuroxime, Doxycycline, Fluoxetine, Hydrocodone, Adalimumab, Hydrochlorothiazide, Hydrocodone-acetaminophen, Hydroxychloroquine, Other, and Vicodin [hydrocodone-acetaminophen]      Assessment / Plan:     Visit Diagnoses: Rheumatoid arthritis involving multiple sites with positive rheumatoid factor ([place]) - She presents today with increased discomfort in both hands and both knee joints.  Her symptoms have been consistent on a daily basis.  On examination she has warmth in both knee joints.  Tenderness over PIP joints in the left first MCP joint noted.  She has been experiencing increased nocturnal pain and morning stiffness.  She is currently on Simponi 50 mg sq injections every month.  She reinitiated Simponi on 11/07/2021.  She has been tolerating Simponi without any side effects or injection site reactions.  She has not missed any doses recently.  She has not been taking any over-the-counter products for pain relief.  Patient requested a prednisone taper today. A prednisone taper starting at 20 mg tapering by 5 mg every 4 days was sent to the pharmacy today.  Discussed that if she continues to experience recurrent flares we will need to discuss other treatment options at her follow-up visit.  She would like to give simponi more time and for [place] to reassess for the full efficacy at her follow-up visit.  She will follow-up in the office in 3 months or sooner if needed. Plan: Sedimentation rate  High risk medication use - Simponi 50 mg sq injections every month.  - Plan: CBC with Differential/Platelet,  COMPLETE METABOLIC PANEL WITH GFR, QuantiFERON-TB Gold Plus Reinitiated Simponi on 11/07/2021. Previous therapy includes Plaquenil, Humira, Enbrel, Orencia.  CBC and CMP updated on 09/25/21.  Orders for CBC and CMP released today.  Her next lab work will be due in march and every 3 months.  Standing orders for CBC and CMP remain in place. TB gold negative on 02/05/21.  Order for TB gold released today.  She has not had any recent or recurrent infections.  Discussed the importance of  holding simponi if she develops signs or symptoms of an infection and to resume once the infection has completely cleared.   Screening for tuberculosis -Order for TB gold released today.  Plan: QuantiFERON-TB Gold Plus  Chronic left shoulder pain - Chronic pain and limited ROM.    Primary osteoarthritis of both hands - Ultrasound of both hands on 02/07/2021 which revealed synovitis.  She presents today with increased pain and stiffness in both hands.  Most of her symptoms are in the PIP joints.  She has tenderness over the left first MCP joint.  No active synovitis noted on examination today.  Primary osteoarthritis of both knees: She has painful ROM of both knee joints. Warmth but no effusion noted.    Chondromalacia of both patellae: She has discomfort with range of motion of both knee joints on examination today.  Primary osteoarthritis of both feet: She is not experiencing any increased discomfort in her feet at this time.  She has good range of motion of both ankle joints with no tenderness or synovitis.  Trapezius muscle spasm: She has some trapezius muscle tension and tenderness bilaterally.  DDD (degenerative disc disease), cervical: C-spine has good range of motion with no discomfort.  No symptoms of radiculopathy at this time.  Chronic midline low back pain without sciatica: X-rays of the lumbar spine were unremarkable on 02/05/2021.  Offered to update x-rays today in the office.  Different treatment options  were discussed today including physical therapy, pain management, as well as referral to a spine specialist for further evaluation.  [facility] today for further evaluation and management of her lower back pain.  Fibromyalgia: She continues to experience intermittent myalgias and muscle tenderness due to fibromyalgia.  Other medical conditions are listed as follows:  Bipolar 2 disorder ([place])  History of gastroesophageal reflux (GERD)  Essential hypertension: Blood pressure was 133/87 today in the office.  History of migraine  Cigarette smoker  Solitary pulmonary nodule  TB lung, latent    Orders: Orders Placed This Encounter  Procedures   CBC with Differential/Platelet   COMPLETE METABOLIC PANEL WITH GFR   QuantiFERON-TB Gold Plus   Sedimentation rate   Meds ordered this encounter  Medications   predniSONE (DELTASONE) 5 MG tablet    Sig: Take 4 tablets by mouth daily x4 days, 3 tablets daily x4 days, 2 tablets daily x4 days, 1 tablet daily x4 days.    Dispense:  40 tablet    Refill:  0    Follow-Up Instructions: Return in about 3 months (around 05/07/2022) for Rheumatoid arthritis, Osteoarthritis.   Ofilia Neas, PA-C  Note - This record has been created using Dragon software.  Chart creation errors have been sought, but may not always  have been located. Such creation errors do not reflect on  the standard of medical care.

## 2022-02-05 ENCOUNTER — Other Ambulatory Visit: Payer: Self-pay

## 2022-02-05 ENCOUNTER — Other Ambulatory Visit (HOSPITAL_COMMUNITY): Payer: Self-pay

## 2022-02-05 ENCOUNTER — Encounter: Payer: Self-pay | Admitting: Physician Assistant

## 2022-02-05 ENCOUNTER — Ambulatory Visit: Payer: Medicare Other | Attending: Physician Assistant | Admitting: Physician Assistant

## 2022-02-05 VITALS — BP 133/87 | HR 77 | Resp 15 | Ht 64.0 in | Wt 190.6 lb

## 2022-02-05 DIAGNOSIS — M19071 Primary osteoarthritis, right ankle and foot: Secondary | ICD-10-CM

## 2022-02-05 DIAGNOSIS — M2241 Chondromalacia patellae, right knee: Secondary | ICD-10-CM

## 2022-02-05 DIAGNOSIS — M797 Fibromyalgia: Secondary | ICD-10-CM

## 2022-02-05 DIAGNOSIS — M5030 Other cervical disc degeneration, unspecified cervical region: Secondary | ICD-10-CM

## 2022-02-05 DIAGNOSIS — Z79899 Other long term (current) drug therapy: Secondary | ICD-10-CM

## 2022-02-05 DIAGNOSIS — M19072 Primary osteoarthritis, left ankle and foot: Secondary | ICD-10-CM

## 2022-02-05 DIAGNOSIS — M19041 Primary osteoarthritis, right hand: Secondary | ICD-10-CM

## 2022-02-05 DIAGNOSIS — Z8669 Personal history of other diseases of the nervous system and sense organs: Secondary | ICD-10-CM

## 2022-02-05 DIAGNOSIS — Z8719 Personal history of other diseases of the digestive system: Secondary | ICD-10-CM

## 2022-02-05 DIAGNOSIS — M545 Low back pain, unspecified: Secondary | ICD-10-CM

## 2022-02-05 DIAGNOSIS — F3181 Bipolar II disorder: Secondary | ICD-10-CM

## 2022-02-05 DIAGNOSIS — F17210 Nicotine dependence, cigarettes, uncomplicated: Secondary | ICD-10-CM

## 2022-02-05 DIAGNOSIS — G8929 Other chronic pain: Secondary | ICD-10-CM

## 2022-02-05 DIAGNOSIS — M25512 Pain in left shoulder: Secondary | ICD-10-CM | POA: Diagnosis not present

## 2022-02-05 DIAGNOSIS — Z111 Encounter for screening for respiratory tuberculosis: Secondary | ICD-10-CM

## 2022-02-05 DIAGNOSIS — M0579 Rheumatoid arthritis with rheumatoid factor of multiple sites without organ or systems involvement: Secondary | ICD-10-CM | POA: Diagnosis not present

## 2022-02-05 DIAGNOSIS — M19042 Primary osteoarthritis, left hand: Secondary | ICD-10-CM

## 2022-02-05 DIAGNOSIS — R911 Solitary pulmonary nodule: Secondary | ICD-10-CM

## 2022-02-05 DIAGNOSIS — Z227 Latent tuberculosis: Secondary | ICD-10-CM

## 2022-02-05 DIAGNOSIS — M62838 Other muscle spasm: Secondary | ICD-10-CM

## 2022-02-05 DIAGNOSIS — M2242 Chondromalacia patellae, left knee: Secondary | ICD-10-CM

## 2022-02-05 DIAGNOSIS — I10 Essential (primary) hypertension: Secondary | ICD-10-CM

## 2022-02-05 DIAGNOSIS — M170 Bilateral primary osteoarthritis of knee: Secondary | ICD-10-CM

## 2022-02-05 MED ORDER — PREDNISONE 5 MG PO TABS: ORAL_TABLET | ORAL | 0 refills | Status: DC

## 2022-02-05 NOTE — Progress Notes (Signed)
ESR is borderline elevated but improved.

## 2022-02-05 NOTE — Patient Instructions (Signed)
Standing Labs We placed an order today for your standing lab work.   Please have your standing labs drawn in march and every 3 months   Please have your labs drawn 2 weeks prior to your appointment so that the provider can discuss your lab results at your appointment.  Please note that you may see your imaging and lab results in [place] before we have reviewed them. We will contact you once all results are reviewed. Please allow our office up to 72 hours to thoroughly review all of the results before contacting the office for clarification of your results.  Lab hours are:   Monday through Thursday from 8:00 am -12:30 pm and 1:00 pm-5:00 pm and Friday from 8:00 am-12:00 pm.  Please be advised, all patients with office appointments requiring lab work will take precedent over walk-in lab work.   Labs are drawn by [facility]. Please bring your co-pay at the time of your lab draw.  You may receive a bill from [facility] for your lab work.  Please note if you are on Hydroxychloroquine and and an order has been placed for a Hydroxychloroquine level, you will need to have it drawn 4 hours or more after your last dose.  If you wish to have your labs drawn at another location, please call the office 24 hours in advance so we can fax the orders.  The office is located at [address], [place], [place],  [address] No appointment is necessary.    If you have any questions regarding directions or hours of operation,  please call [phone].   As a reminder, please drink plenty of water prior to coming for your lab work. Thanks!

## 2022-02-05 NOTE — Progress Notes (Signed)
Absolute eosinophils are elevated. Rest of CBC WNL.

## 2022-02-05 NOTE — Addendum Note (Signed)
Addended by: Earnestine Mealing on: 02/05/2022 01:32 PM   Modules accepted: Orders

## 2022-02-06 NOTE — Progress Notes (Signed)
Glucose is 104.  Rest of CMP WNL.

## 2022-02-07 ENCOUNTER — Telehealth: Payer: Self-pay | Admitting: Pharmacist

## 2022-02-07 LAB — CBC WITH DIFFERENTIAL/PLATELET
Absolute Monocytes: 340 cells/uL (ref 200–950)
Basophils Absolute: 61 cells/uL (ref 0–200)
Basophils Relative: 0.9 %
Eosinophils Absolute: 1142 cells/uL — ABNORMAL HIGH (ref 15–500)
Eosinophils Relative: 16.8 %
HCT: 43.3 % (ref 35.0–45.0)
Hemoglobin: 15 g/dL (ref 11.7–15.5)
Lymphs Abs: 3332 cells/uL (ref 850–3900)
MCH: 30.2 pg (ref 27.0–33.0)
MCHC: 34.6 g/dL (ref 32.0–36.0)
MCV: 87.3 fL (ref 80.0–100.0)
MPV: 10.1 fL (ref 7.5–12.5)
Monocytes Relative: 5 %
Neutro Abs: 1924 cells/uL (ref 1500–7800)
Neutrophils Relative %: 28.3 %
Platelets: 338 10*3/uL (ref 140–400)
RBC: 4.96 10*6/uL (ref 3.80–5.10)
RDW: 14.4 % (ref 11.0–15.0)
Total Lymphocyte: 49 %
WBC: 6.8 10*3/uL (ref 3.8–10.8)

## 2022-02-07 LAB — COMPLETE METABOLIC PANEL WITH GFR
AG Ratio: 1.3 (calc) (ref 1.0–2.5)
ALT: 20 U/L (ref 6–29)
AST: 18 U/L (ref 10–30)
Albumin: 4.3 g/dL (ref 3.6–5.1)
Alkaline phosphatase (APISO): 53 U/L (ref 31–125)
BUN: 12 mg/dL (ref 7–25)
CO2: 24 mmol/L (ref 20–32)
Calcium: 9.4 mg/dL (ref 8.6–10.2)
Chloride: 105 mmol/L (ref 98–110)
Creat: 0.88 mg/dL (ref 0.50–0.99)
Globulin: 3.4 g/dL (calc) (ref 1.9–3.7)
Glucose, Bld: 104 mg/dL — ABNORMAL HIGH (ref 65–99)
Potassium: 4.3 mmol/L (ref 3.5–5.3)
Sodium: 139 mmol/L (ref 135–146)
Total Bilirubin: 0.6 mg/dL (ref 0.2–1.2)
Total Protein: 7.7 g/dL (ref 6.1–8.1)
eGFR: 84 mL/min/{1.73_m2} (ref >=60)

## 2022-02-07 LAB — QUANTIFERON-TB GOLD PLUS
Mitogen-NIL: >10 IU/mL
NIL: 0.06 IU/mL
QuantiFERON-TB Gold Plus: NEGATIVE
TB1-NIL: 0.01 IU/mL
TB2-NIL: <0 IU/mL

## 2022-02-07 LAB — SEDIMENTATION RATE: Sed Rate: 22 mm/h — ABNORMAL HIGH (ref 0–20)

## 2022-02-07 NOTE — Telephone Encounter (Signed)
Received notification from [facility] regarding a prior authorization for [facility]. Authorization has been APPROVED from 02/07/2022 to 02/24/2023. Approval letter sent to scan center.  Patient can continue to fill through [facility]: [phone]   Authorization # [id number] Phone # [phone]  Knox Saliva, PharmD, MPH, BCPS, CPP Clinical Pharmacist (Rheumatology and Pulmonology)

## 2022-02-07 NOTE — Telephone Encounter (Signed)
Per [employer], [place] set to expire on 02/23/22. Submitted a Prior Authorization RENEWAL request to [facility] for [facility] via CoverMyMeds. Will update once we receive a response.  Key: [id number]  Knox Saliva, PharmD, MPH, BCPS, CPP Clinical Pharmacist (Rheumatology and Pulmonology)

## 2022-02-10 ENCOUNTER — Encounter: Payer: Self-pay | Admitting: Orthopedic Surgery

## 2022-02-10 ENCOUNTER — Ambulatory Visit (INDEPENDENT_AMBULATORY_CARE_PROVIDER_SITE_OTHER): Payer: Medicare Other

## 2022-02-10 ENCOUNTER — Ambulatory Visit (INDEPENDENT_AMBULATORY_CARE_PROVIDER_SITE_OTHER): Payer: Medicare Other | Admitting: Orthopedic Surgery

## 2022-02-10 VITALS — BP 118/80 | HR 83 | Ht 64.0 in | Wt 191.0 lb

## 2022-02-10 DIAGNOSIS — M545 Low back pain, unspecified: Secondary | ICD-10-CM | POA: Diagnosis not present

## 2022-02-10 NOTE — Progress Notes (Signed)
Orthopedic Spine Surgery Office Note  Assessment: Patient is a 41 y.o. female with low back that radiates into right labia. Possible L5 radiculopathy   Plan: -Explained that initially conservative treatment is tried as a significant number of patients may experience relief with these treatment modalities. Discussed that the conservative treatments include:  -activity modification  -physical therapy  -over the counter pain medications  -medrol dosepak  -lumbar steroid injections -Patient has tried physical therapy, NSAIDs, Medrol Dosepak, intramuscular steroid injection  -Pain has been getting better with the Medrol Dosepak, so recommend she continue that to completion.  She should also work on Copywriter, advertising.  She had done PT but did not continue with the core strengthening.  She has done 6 weeks of conservative treatment under Dr. Estanislado Pandy, so would recommend MRI lumbar spine if she does not have continued improvement in her symptoms or they recur -Patient should return to office on an as-needed basis   Patient expressed understanding of the plan and all questions were answered to the patient's satisfaction.   ___________________________________________________________________________   History:  Patient is a 41 y.o. female who presents today for lumbar spine.  Patient has had about 8 weeks of low back pain.  Still in the lower lumbar spine region.  There was no trauma or injury that brought on the pain.  Pain is worse with change in position (for example, getting out of bed).  She also feels that if she is standing for prolonged period of time and gives example of when she is cooking dinner.  Pain is better if she sits down.  She says the pain radiates into her labia on the right side.  There is no other radiating pain.  Denies paresthesias and numbness.   Weakness: Yes, periodically feels that her legs are weaker bilaterally.  Does not feel that today.  No other weakness  noted Symptoms of imbalance: Yes, sometimes feels off balance Paresthesias and numbness: Denies Bowel or bladder incontinence: Denies Saddle anesthesia: Denies  Treatments tried: Physical therapy, NSAIDs, Medrol Dosepak, intramuscular steroid injection  Review of systems: Denies fevers and chills, night sweats, unexplained weight loss, history of cancer.  Has had pain that wakes her at night  Past medical history: Rheumatoid arthritis Fibromyalgia Hypertension Depression Anxiety Acid reflux Irritable bowel syndrome Bipolar Migraines  Allergies: Cefuroxime, doxycycline, fluoxetine, hydrocodone, adalimumab, hydrochlorothiazide, hydrochloric when, Vicodin  Past surgical history:  Cesarean section Tubal ligation  Social history: Reports use of nicotine product (smoking, vaping, patches, smokeless) -vape Alcohol use: Yes, 3 drinks per week Denies recreational drug use   Physical Exam:  General: no acute distress, appears stated age Neurologic: alert, answering questions appropriately, following commands Respiratory: unlabored breathing on room air, symmetric chest rise Psychiatric: appropriate affect, normal cadence to speech   MSK (spine):  -Strength exam      Left  Right EHL    5/5  4+/5 TA    5/5  5/5 GSC    5/5  5/5 Knee extension  5/5  5/5 Hip flexion   5/5  5/5  -Sensory exam    Sensation intact to light touch in L3-S1 nerve distributions of bilateral lower extremities  -Achilles DTR: 2/4 on the left, 2/4 on the right -Patellar tendon DTR: 1/4 on the left, 1/4 on the right  -Straight leg raise: negative -Contralateral straight leg raise: negative -Femoral nerve stretch test: negative bilaterally -Clonus: no beats bilaterally -Negative Romberg -No instability with tandem gait -Negative grip and release test  -Left hip exam:  No pain through range of motion, negative FABER, negative Stinchfield -Right hip exam: No pain to range of motion, negative  Stinchfield, negative Faber  Imaging: X-ray of the lumbar spine from 02/10/2022 was independently reviewed and interpreted, showing no fracture or dislocation.  No evidence of instability on flexion/extension views.  No significant degenerative changes  Patient name: Elizabeth Pope Patient MRN: [id number] Date of visit: 02/10/22

## 2022-02-10 NOTE — Progress Notes (Signed)
TB gold negative

## 2022-02-23 ENCOUNTER — Other Ambulatory Visit: Payer: Self-pay | Admitting: Physician Assistant

## 2022-03-03 ENCOUNTER — Other Ambulatory Visit (HOSPITAL_COMMUNITY): Payer: Self-pay

## 2022-03-04 ENCOUNTER — Other Ambulatory Visit: Payer: Self-pay | Admitting: Physician Assistant

## 2022-03-04 ENCOUNTER — Other Ambulatory Visit (HOSPITAL_COMMUNITY): Payer: Self-pay

## 2022-03-04 DIAGNOSIS — M0579 Rheumatoid arthritis with rheumatoid factor of multiple sites without organ or systems involvement: Secondary | ICD-10-CM

## 2022-03-04 DIAGNOSIS — Z79899 Other long term (current) drug therapy: Secondary | ICD-10-CM

## 2022-03-04 MED ORDER — SIMPONI 50 MG/0.5ML ~~LOC~~ SOAJ
SUBCUTANEOUS | 0 refills | Status: DC
  Filled 2022-03-04: qty 0.5, 35d supply, fill #0

## 2022-03-04 NOTE — Telephone Encounter (Signed)
Next Visit: 05/07/2022  Last Visit: 02/05/2022  Last Fill: 01/23/2022 (30 day supply)  DX: Rheumatoid arthritis involving multiple sites with positive rheumatoid factor   Current Dose per office note 02/05/2022: Simponi 50 mg sq injections every month.   Labs: 02/05/2022 Glucose is 104.  Rest of CMP WNL.     TB Gold: 02/05/2022 Neg   Okay to refill Simponi?

## 2022-03-18 ENCOUNTER — Other Ambulatory Visit: Payer: Self-pay

## 2022-03-28 ENCOUNTER — Other Ambulatory Visit: Payer: Self-pay | Admitting: Physician Assistant

## 2022-04-03 ENCOUNTER — Other Ambulatory Visit (HOSPITAL_COMMUNITY): Payer: Self-pay

## 2022-04-08 ENCOUNTER — Other Ambulatory Visit (HOSPITAL_COMMUNITY): Payer: Self-pay

## 2022-04-10 ENCOUNTER — Other Ambulatory Visit: Payer: Self-pay | Admitting: Rheumatology

## 2022-04-10 ENCOUNTER — Other Ambulatory Visit: Payer: Self-pay

## 2022-04-10 ENCOUNTER — Other Ambulatory Visit (HOSPITAL_COMMUNITY): Payer: Self-pay

## 2022-04-10 DIAGNOSIS — Z79899 Other long term (current) drug therapy: Secondary | ICD-10-CM

## 2022-04-10 DIAGNOSIS — M0579 Rheumatoid arthritis with rheumatoid factor of multiple sites without organ or systems involvement: Secondary | ICD-10-CM

## 2022-04-10 MED ORDER — SIMPONI 50 MG/0.5ML ~~LOC~~ SOAJ
SUBCUTANEOUS | 0 refills | Status: DC
  Filled 2022-04-10: qty 0.5, 35d supply, fill #0
  Filled 2022-05-23: qty 0.5, 35d supply, fill #1
  Filled 2022-06-17: qty 0.5, 35d supply, fill #2

## 2022-04-10 NOTE — Telephone Encounter (Signed)
Next Visit: 05/07/2022  Last Visit: 02/05/2022  Last Fill: 03/04/2022  DX: Rheumatoid arthritis involving multiple sites with positive rheumatoid factor   Current Dose per office note 02/05/2022: Simponi 50 mg sq injections every month  Labs: 02/05/2022  Glucose is 104.  Rest of CMP WNL.     TB Gold: 02/05/2022,  TB gold negative   Okay to refill Simponi?

## 2022-04-22 ENCOUNTER — Other Ambulatory Visit (HOSPITAL_COMMUNITY): Payer: Self-pay

## 2022-04-23 ENCOUNTER — Other Ambulatory Visit (HOSPITAL_COMMUNITY): Payer: Self-pay

## 2022-04-23 ENCOUNTER — Other Ambulatory Visit: Payer: Self-pay

## 2022-04-23 NOTE — Progress Notes (Deleted)
Office Visit Note  Patient: Elizabeth Pope             Date of Birth: [birth year]           MRN: [id number]             PCP: Kristie Cowman, MD Referring: Kristie Cowman, MD Visit Date: 05/07/2022 Occupation: '@GUAROCC'$ @  Subjective:    History of Present Illness: Elizabeth Pope is a 42 y.o. female with history of seropositive rheumatoid arthritis and osteoarthritis.  Patient is currently on Simponi 50 mg subcutaneous injections once monthly.  CBC and CMP were drawn on 02/05/2022.  Orders for CBC and CMP were released today.  Her next lab work will be due in June and every 3 months to monitor for drug toxicity. TB Gold negative on 02/05/2022. Discussed the importance of holding Simponi if she develops signs or symptoms of infection and to resume once the infection is completely cleared.  Activities of Daily Living:  Patient reports morning stiffness for *** {minute/hour:19697}.   Patient {ACTIONS;DENIES/REPORTS:21021675::"Denies"} nocturnal pain.  Difficulty dressing/grooming: {ACTIONS;DENIES/REPORTS:21021675::"Denies"} Difficulty climbing stairs: {ACTIONS;DENIES/REPORTS:21021675::"Denies"} Difficulty getting out of chair: {ACTIONS;DENIES/REPORTS:21021675::"Denies"} Difficulty using hands for taps, buttons, cutlery, and/or writing: {ACTIONS;DENIES/REPORTS:21021675::"Denies"}  Review of Systems  Constitutional:  Negative for fatigue.  HENT:  Negative for mouth sores, mouth dryness and nose dryness.   Eyes:  Negative for pain, visual disturbance and dryness.  Respiratory:  Negative for cough, hemoptysis, shortness of breath and difficulty breathing.   Cardiovascular:  Negative for chest pain, palpitations, hypertension and swelling in legs/feet.  Gastrointestinal:  Negative for blood in stool, constipation and diarrhea.  Endocrine: Negative for increased urination.  Genitourinary:  Negative for painful urination.  Musculoskeletal:  Negative for joint pain, joint pain,  joint swelling, myalgias, muscle weakness, morning stiffness, muscle tenderness and myalgias.  Skin:  Negative for color change, pallor, rash, hair loss, nodules/bumps, skin tightness, ulcers and sensitivity to sunlight.  Allergic/Immunologic: Negative for susceptible to infections.  Neurological:  Negative for dizziness, numbness, headaches and weakness.  Hematological:  Negative for swollen glands.  Psychiatric/Behavioral:  Negative for depressed mood and sleep disturbance. The patient is not nervous/anxious.     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   Pelvic pain 06/02/2017   Atypical squamous cell changes of undetermined significance (ASCUS) on cervical cytology with negative high risk human papilloma virus (HPV) test result 02/03/2017   Encounter for IUD insertion 01/28/2017   Uterine fibroid 01/21/2017   H/O tubal ligation 06/24/2016   High risk medication use 01/07/2016   GERD (gastroesophageal reflux disease) 12/25/2015   Migraines 12/25/2015   Hypertension 12/25/2015   RA (rheumatoid arthritis) ([place]) 12/25/2015   DDD (degenerative disc disease), cervical 12/25/2015   Osteoarthritis of both hands 12/25/2015   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/2015   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   TB lung, latent 05/14/2015   Bipolar 2 disorder ([place]) 05/14/2015   Cigarette smoker 05/05/2015   Chest pain 05/05/2015   Solitary pulmonary nodule 05/04/2015    Past Medical History:  Diagnosis Date   Anxiety    Bipolar 1 disorder ([place])    DDD (degenerative disc disease), cervical 12/25/2015   Depression    GERD (gastroesophageal reflux disease) 12/25/2015   Hypertension 12/25/2015   Migraine    Migraines 12/25/2015   OCD (obsessive compulsive disorder)    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/2015  mild   Osteoarthritis of both hands 12/25/2015   PTSD  (post-traumatic stress disorder)    RA (rheumatoid arthritis) ([place]) 12/25/2015   Positive RF, Elevated ESR, Positive CCP > 250     Family History  Problem Relation Age of Onset   Rheum arthritis Mother    Migraines Mother    Hypertension Mother    Colitis Mother    Rheum arthritis Maternal Grandmother    Rheum arthritis Maternal Aunt    Migraines Sister    Migraines Brother    Sickle cell anemia Daughter    Bipolar disorder Daughter    Anxiety disorder Daughter    Depression Daughter    Migraines Daughter    Arrhythmia Daughter    Migraines Son    ADD / ADHD Son    Seizures Son    Arrhythmia Son    Past Surgical History:  Procedure Laterality Date   CESAREAN SECTION     TUBAL LIGATION     Social History   Social History Narrative   Not on file   Immunization History  Administered Date(s) Administered   PFIZER(Purple Top)SARS-COV-2 Vaccination 01/05/2020, 01/26/2020     Objective: Vital Signs: There were no vitals taken for this visit.   Physical Exam Vitals and nursing note reviewed.  Constitutional:      Appearance: She is well-developed.  HENT:     Head: Normocephalic and atraumatic.  Eyes:     Conjunctiva/sclera: Conjunctivae normal.  Cardiovascular:     Rate and Rhythm: Normal rate and regular rhythm.     Heart sounds: Normal heart sounds.  Pulmonary:     Effort: Pulmonary effort is normal.     Breath sounds: Normal breath sounds.  Abdominal:     General: Bowel sounds are normal.     Palpations: Abdomen is soft.  Musculoskeletal:     Cervical back: Normal range of motion.  Skin:    General: Skin is warm and dry.     Capillary Refill: Capillary refill takes less than 2 seconds.  Neurological:     Mental Status: She is alert and oriented to person, place, and time.  Psychiatric:        Behavior: Behavior normal.      Musculoskeletal Exam: C-spine, thoracic spine, lumbar spine have good range of motion.  Shoulder joints, elbow joints, wrist  joints, MCPs, PIPs, DIPs have good range of motion with no synovitis.  Complete fist formation bilaterally.  Hip joints have good range of motion with no groin pain.  Knee joints have good range of motion with no warmth or effusion.  Ankle joints have good range of motion with no tenderness or joint swelling.  CDAI Exam: CDAI Score: -- Patient Global: --; Provider Global: -- Swollen: --; Tender: -- Joint Exam 05/07/2022   No joint exam has been documented for this visit   There is currently no information documented on the homunculus. Go to the Rheumatology activity and complete the homunculus joint exam.  Investigation: No additional findings.  Imaging: No results found.  Recent Labs: Lab Results  Component Value Date   WBC 6.8 02/05/2022   HGB 15.0 02/05/2022   PLT 338 02/05/2022   NA 139 02/05/2022   K 4.3 02/05/2022   CL 105 02/05/2022   CO2 24 02/05/2022   GLUCOSE 104 (H) 02/05/2022   BUN 12 02/05/2022   CREATININE 0.88 02/05/2022   BILITOT 0.6 02/05/2022   ALKPHOS 43 03/24/2021   AST 18 02/05/2022   ALT 20 02/05/2022  PROT 7.7 02/05/2022   ALBUMIN 3.6 03/24/2021   CALCIUM 9.4 02/05/2022   GFRAA 90 07/03/2020   QFTBGOLDPLUS NEGATIVE 02/05/2022    Speciality Comments: PLQ Eye Exam: WNL 03/24/17 @ [facility],PLQ-diarrhea Prior therapy: Humira (facial rash) Humira & Enbrel (stopped due to waning response) - Orencia (injection site reaction) one dose on 02/19/21 -- Simponi started 03/06/21  Procedures:  No procedures performed Allergies: Cefuroxime, Doxycycline, Fluoxetine, Hydrocodone, Adalimumab, Hydrochlorothiazide, Hydrocodone-acetaminophen, Hydroxychloroquine, Other, and Vicodin [hydrocodone-acetaminophen]   Assessment / Plan:     Visit Diagnoses: Rheumatoid arthritis involving multiple sites with positive rheumatoid factor ([place])  High risk medication use  Chronic left shoulder pain  Primary osteoarthritis of both hands  Primary osteoarthritis of  both knees  Chondromalacia of both patellae  Primary osteoarthritis of both feet  Trapezius muscle spasm  DDD (degenerative disc disease), cervical  Fibromyalgia  Bipolar 2 disorder ([place])  History of gastroesophageal reflux (GERD)  Essential hypertension  History of migraine  Cigarette smoker  Solitary pulmonary nodule  TB lung, latent  Orders: No orders of the defined types were placed in this encounter.  No orders of the defined types were placed in this encounter.   Follow-Up Instructions: No follow-ups on file.   Ofilia Neas, PA-C  Note - This record has been created using Dragon software.  Chart creation errors have been sought, but may not always  have been located. Such creation errors do not reflect on  the standard of medical care.

## 2022-05-07 ENCOUNTER — Ambulatory Visit: Payer: Medicare Other | Admitting: Physician Assistant

## 2022-05-07 DIAGNOSIS — Z227 Latent tuberculosis: Secondary | ICD-10-CM

## 2022-05-07 DIAGNOSIS — M170 Bilateral primary osteoarthritis of knee: Secondary | ICD-10-CM

## 2022-05-07 DIAGNOSIS — M19071 Primary osteoarthritis, right ankle and foot: Secondary | ICD-10-CM

## 2022-05-07 DIAGNOSIS — F17210 Nicotine dependence, cigarettes, uncomplicated: Secondary | ICD-10-CM

## 2022-05-07 DIAGNOSIS — R911 Solitary pulmonary nodule: Secondary | ICD-10-CM

## 2022-05-07 DIAGNOSIS — M797 Fibromyalgia: Secondary | ICD-10-CM

## 2022-05-07 DIAGNOSIS — M5030 Other cervical disc degeneration, unspecified cervical region: Secondary | ICD-10-CM

## 2022-05-07 DIAGNOSIS — Z79899 Other long term (current) drug therapy: Secondary | ICD-10-CM

## 2022-05-07 DIAGNOSIS — M19041 Primary osteoarthritis, right hand: Secondary | ICD-10-CM

## 2022-05-07 DIAGNOSIS — Z8669 Personal history of other diseases of the nervous system and sense organs: Secondary | ICD-10-CM

## 2022-05-07 DIAGNOSIS — Z8719 Personal history of other diseases of the digestive system: Secondary | ICD-10-CM

## 2022-05-07 DIAGNOSIS — M0579 Rheumatoid arthritis with rheumatoid factor of multiple sites without organ or systems involvement: Secondary | ICD-10-CM

## 2022-05-07 DIAGNOSIS — I10 Essential (primary) hypertension: Secondary | ICD-10-CM

## 2022-05-07 DIAGNOSIS — F3181 Bipolar II disorder: Secondary | ICD-10-CM

## 2022-05-07 DIAGNOSIS — M2241 Chondromalacia patellae, right knee: Secondary | ICD-10-CM

## 2022-05-07 DIAGNOSIS — M62838 Other muscle spasm: Secondary | ICD-10-CM

## 2022-05-07 DIAGNOSIS — G8929 Other chronic pain: Secondary | ICD-10-CM

## 2022-05-20 ENCOUNTER — Other Ambulatory Visit (HOSPITAL_COMMUNITY): Payer: Self-pay

## 2022-05-21 NOTE — Progress Notes (Deleted)
Office Visit Note  Patient: Elizabeth Pope             Date of Birth: January 08, 1981           MRN: [id number]             PCP: Knox Royalty, MD Referring: Knox Royalty, MD Visit Date: 06/04/2022 Occupation: @GUAROCC @  Subjective:    History of Present Illness: Elizabeth Pope is a 42 y.o. female with history of seropositive rheumatoid arthritis and osteoarthritis.  Patient remains on Simponi 50 mg sq injections every month   CBC and CMP updated on 02/05/2022.  Orders for CBC and CMP were released today.  Her next lab work will be due in July and every 3 months to monitor for drug toxicity. TB Gold negative on 02/05/2022. Discussed the importance of holding simponi if she develops signs or symptoms of an infection and to resume once the infection has completely cleared.   Activities of Daily Living:  Patient reports morning stiffness for *** {minute/hour:19697}.   Patient {ACTIONS;DENIES/REPORTS:21021675::"Denies"} nocturnal pain.  Difficulty dressing/grooming: {ACTIONS;DENIES/REPORTS:21021675::"Denies"} Difficulty climbing stairs: {ACTIONS;DENIES/REPORTS:21021675::"Denies"} Difficulty getting out of chair: {ACTIONS;DENIES/REPORTS:21021675::"Denies"} Difficulty using hands for taps, buttons, cutlery, and/or writing: {ACTIONS;DENIES/REPORTS:21021675::"Denies"}  No Rheumatology ROS completed.   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   Pelvic pain 06/02/2017   Atypical squamous cell changes of undetermined significance (ASCUS) on cervical cytology with negative high risk human papilloma virus (HPV) test result 02/03/2017   Encounter for IUD insertion 01/28/2017   Uterine fibroid 01/21/2017   H/O tubal ligation 06/24/2016   High risk medication use 01/07/2016   GERD (gastroesophageal reflux disease) 12/25/2015   Migraines 12/25/2015    Hypertension 12/25/2015   RA (rheumatoid arthritis) ([facility]) 12/25/2015   DDD (degenerative disc disease), cervical 12/25/2015   Osteoarthritis of both hands 12/25/2015   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/2015   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   TB lung, latent 05/14/2015   Bipolar 2 disorder ([facility]) 05/14/2015   Cigarette smoker 05/05/2015   Chest pain 05/05/2015   Solitary pulmonary nodule 05/04/2015    Past Medical History:  Diagnosis Date   Anxiety    Bipolar 1 disorder ([facility])    DDD (degenerative disc disease), cervical 12/25/2015   Depression    GERD (gastroesophageal reflux disease) 12/25/2015   Hypertension 12/25/2015   Migraine    Migraines 12/25/2015   OCD (obsessive compulsive disorder)    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/2015   mild   Osteoarthritis of both hands 12/25/2015   PTSD (post-traumatic stress disorder)    RA (rheumatoid arthritis) ([facility]) 12/25/2015   Positive RF, Elevated ESR, Positive CCP > 250     Family History  Problem Relation Age of Onset   Rheum arthritis Mother    Migraines Mother    Hypertension Mother    Colitis Mother    Rheum arthritis Maternal Grandmother    Rheum arthritis Maternal Aunt    Migraines Sister    Migraines Brother    Sickle cell anemia Daughter    Bipolar disorder Daughter    Anxiety disorder Daughter    Depression Daughter    Migraines Daughter    Arrhythmia Daughter    Migraines Son    ADD / ADHD Son    Seizures Son    Arrhythmia Son    Past Surgical History:  Procedure Laterality Date   CESAREAN  SECTION     TUBAL LIGATION     Social History   Social History Narrative   Not on file   Immunization History  Administered Date(s) Administered   PFIZER(Purple Top)SARS-COV-2 Vaccination 01/05/2020, 01/26/2020     Objective: Vital Signs: There were no vitals taken for this visit.   Physical Exam Vitals and nursing note reviewed.  Constitutional:      Appearance: She is well-developed.  HENT:      Head: Normocephalic and atraumatic.  Eyes:     Conjunctiva/sclera: Conjunctivae normal.  Cardiovascular:     Rate and Rhythm: Normal rate and regular rhythm.     Heart sounds: Normal heart sounds.  Pulmonary:     Effort: Pulmonary effort is normal.     Breath sounds: Normal breath sounds.  Abdominal:     General: Bowel sounds are normal.     Palpations: Abdomen is soft.  Musculoskeletal:     Cervical back: Normal range of motion.  Lymphadenopathy:     Cervical: No cervical adenopathy.  Skin:    General: Skin is warm and dry.     Capillary Refill: Capillary refill takes less than 2 seconds.  Neurological:     Mental Status: She is alert and oriented to person, place, and time.  Psychiatric:        Behavior: Behavior normal.      Musculoskeletal Exam: ***  CDAI Exam: CDAI Score: -- Patient Global: --; Provider Global: -- Swollen: --; Tender: -- Joint Exam 06/04/2022   No joint exam has been documented for this visit   There is currently no information documented on the homunculus. Go to the Rheumatology activity and complete the homunculus joint exam.  Investigation: No additional findings.  Imaging: No results found.  Recent Labs: Lab Results  Component Value Date   WBC 6.8 02/05/2022   HGB 15.0 02/05/2022   PLT 338 02/05/2022   NA 139 02/05/2022   K 4.3 02/05/2022   CL 105 02/05/2022   CO2 24 02/05/2022   GLUCOSE 104 (H) 02/05/2022   BUN 12 02/05/2022   CREATININE 0.88 02/05/2022   BILITOT 0.6 02/05/2022   ALKPHOS 43 03/24/2021   AST 18 02/05/2022   ALT 20 02/05/2022   PROT 7.7 02/05/2022   ALBUMIN 3.6 03/24/2021   CALCIUM 9.4 02/05/2022   GFRAA 90 07/03/2020   QFTBGOLDPLUS NEGATIVE 02/05/2022    Speciality Comments: PLQ Eye Exam: WNL 03/24/17 @ [facility],PLQ-diarrhea Prior therapy: Humira (facial rash) Humira & Enbrel (stopped due to waning response) - Orencia (injection site reaction) one dose on 02/19/21 -- Simponi started  03/06/21  Procedures:  No procedures performed Allergies: Cefuroxime, Doxycycline, Fluoxetine, Hydrocodone, Adalimumab, Hydrochlorothiazide, Hydrocodone-acetaminophen, Hydroxychloroquine, Other, and Vicodin [hydrocodone-acetaminophen]   Assessment / Plan:     Visit Diagnoses: Rheumatoid arthritis involving multiple sites with positive rheumatoid factor ([facility])  High risk medication use  Chronic left shoulder pain  Primary osteoarthritis of both hands  Primary osteoarthritis of both knees  Chondromalacia of both patellae  Primary osteoarthritis of both feet  Trapezius muscle spasm  DDD (degenerative disc disease), cervical  Fibromyalgia  Bipolar 2 disorder ([facility])  History of gastroesophageal reflux (GERD)  Essential hypertension  History of migraine  Cigarette smoker  Solitary pulmonary nodule  TB lung, latent  Orders: No orders of the defined types were placed in this encounter.  No orders of the defined types were placed in this encounter.   Face-to-face time spent with patient was *** minutes. Greater than 50% of time  was spent in counseling and coordination of care.  Follow-Up Instructions: No follow-ups on file.   Ofilia Neas, PA-C  Note - This record has been created using Dragon software.  Chart creation errors have been sought, but may not always  have been located. Such creation errors do not reflect on  the standard of medical care.

## 2022-05-23 ENCOUNTER — Other Ambulatory Visit (HOSPITAL_COMMUNITY): Payer: Self-pay

## 2022-05-26 ENCOUNTER — Other Ambulatory Visit: Payer: Self-pay

## 2022-05-29 ENCOUNTER — Other Ambulatory Visit (HOSPITAL_COMMUNITY): Payer: Self-pay

## 2022-06-04 ENCOUNTER — Ambulatory Visit: Payer: Self-pay | Admitting: Physician Assistant

## 2022-06-04 DIAGNOSIS — M797 Fibromyalgia: Secondary | ICD-10-CM

## 2022-06-04 DIAGNOSIS — M5030 Other cervical disc degeneration, unspecified cervical region: Secondary | ICD-10-CM

## 2022-06-04 DIAGNOSIS — G8929 Other chronic pain: Secondary | ICD-10-CM

## 2022-06-04 DIAGNOSIS — I10 Essential (primary) hypertension: Secondary | ICD-10-CM

## 2022-06-04 DIAGNOSIS — M19072 Primary osteoarthritis, left ankle and foot: Secondary | ICD-10-CM

## 2022-06-04 DIAGNOSIS — M19041 Primary osteoarthritis, right hand: Secondary | ICD-10-CM

## 2022-06-04 DIAGNOSIS — M2242 Chondromalacia patellae, left knee: Secondary | ICD-10-CM

## 2022-06-04 DIAGNOSIS — Z79899 Other long term (current) drug therapy: Secondary | ICD-10-CM

## 2022-06-04 DIAGNOSIS — M62838 Other muscle spasm: Secondary | ICD-10-CM

## 2022-06-04 DIAGNOSIS — F3181 Bipolar II disorder: Secondary | ICD-10-CM

## 2022-06-04 DIAGNOSIS — R911 Solitary pulmonary nodule: Secondary | ICD-10-CM

## 2022-06-04 DIAGNOSIS — Z8669 Personal history of other diseases of the nervous system and sense organs: Secondary | ICD-10-CM

## 2022-06-04 DIAGNOSIS — M170 Bilateral primary osteoarthritis of knee: Secondary | ICD-10-CM

## 2022-06-04 DIAGNOSIS — M0579 Rheumatoid arthritis with rheumatoid factor of multiple sites without organ or systems involvement: Secondary | ICD-10-CM

## 2022-06-04 DIAGNOSIS — Z227 Latent tuberculosis: Secondary | ICD-10-CM

## 2022-06-04 DIAGNOSIS — F17210 Nicotine dependence, cigarettes, uncomplicated: Secondary | ICD-10-CM

## 2022-06-04 DIAGNOSIS — Z8719 Personal history of other diseases of the digestive system: Secondary | ICD-10-CM

## 2022-06-11 NOTE — Progress Notes (Signed)
Office Visit Note  Patient: Elizabeth Pope             Date of Birth: [birth year]           MRN: [id number]             PCP: Knox Royalty, MD Referring: Knox Royalty, MD Visit Date: 06/25/2022 Occupation: @GUAROCC @  Subjective:  Left trapezius muscle tension and tenderness   History of Present Illness: Elizabeth Pope is a 42 y.o. female with history of seropositive rheumatoid arthritis and osteoarthritis.  She remains on Simponi 50 mg sq injections every month.  She has been tolerating Simponi without any side effects or injection site reactions.  She has not missed any doses recently.  She denies any recent or recurrent infections.  Overall she has noticed improvement while on Simponi monthly injections.  She denies any breakthrough symptoms leading up to her monthly dosing.  She states that her knee joint pain has improved overall.  She denies any swelling in her knee joints at this time.  She has had ongoing trapezius muscle tension and tenderness especially on the left side.  She experiences muscle spasms and a burning sensation at times.  Patient denies any pain in the left shoulder joint at this time.  She states that her lower back pain has improved.  She was evaluated by Dr. Christell Constant but did not end up proceeding with an MRI of the lumbar spine since her symptoms have been tolerable.    Activities of Daily Living:  Patient reports morning stiffness for 30 minutes.   Patient Denies nocturnal pain.  Difficulty dressing/grooming: Reports Difficulty climbing stairs: Reports Difficulty getting out of chair: Reports Difficulty using hands for taps, buttons, cutlery, and/or writing: Reports  Review of Systems  Constitutional:  Positive for fatigue.  HENT:  Positive for mouth dryness. Negative for mouth sores.   Eyes:  Negative for pain, itching and dryness.  Respiratory:  Negative for shortness of breath.   Cardiovascular:  Negative for chest pain and palpitations.   Gastrointestinal:  Negative for blood in stool, constipation and diarrhea.  Endocrine: Positive for increased urination.  Genitourinary:  Negative for involuntary urination.  Musculoskeletal:  Positive for joint pain, joint pain, joint swelling and morning stiffness. Negative for gait problem, myalgias, muscle weakness, muscle tenderness and myalgias.  Skin:  Negative for color change, rash, hair loss and sensitivity to sunlight.  Allergic/Immunologic: Negative for susceptible to infections.  Neurological:  Positive for dizziness and headaches.  Hematological:  Negative for swollen glands.  Psychiatric/Behavioral:  Positive for sleep disturbance. Negative for depressed mood. The patient is nervous/anxious.     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   Pelvic pain 06/02/2017   Atypical squamous cell changes of undetermined significance (ASCUS) on cervical cytology with negative high risk human papilloma virus (HPV) test result 02/03/2017   Encounter for IUD insertion 01/28/2017   Uterine fibroid 01/21/2017   H/O tubal ligation 06/24/2016   High risk medication use 01/07/2016   GERD (gastroesophageal reflux disease) 12/25/2015   Migraines 12/25/2015   Hypertension 12/25/2015   RA (rheumatoid arthritis) (HCC) 12/25/2015   DDD (degenerative disc disease), cervical 12/25/2015   Osteoarthritis of both hands 12/25/2015   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/2015   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   TB lung, latent 05/14/2015   Bipolar 2 disorder (HCC) 05/14/2015  Cigarette smoker 05/05/2015   Chest pain 05/05/2015   Solitary pulmonary nodule 05/04/2015    Past Medical History:  Diagnosis Date   Anxiety    Bipolar 1 disorder (HCC)    DDD (degenerative disc disease), cervical 12/25/2015   Depression    GERD (gastroesophageal reflux disease) 12/25/2015    Hypertension 12/25/2015   Migraine    Migraines 12/25/2015   OCD (obsessive compulsive disorder)    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/2015   mild   Osteoarthritis of both hands 12/25/2015   PTSD (post-traumatic stress disorder)    RA (rheumatoid arthritis) (HCC) 12/25/2015   Positive RF, Elevated ESR, Positive CCP > 250     Family History  Problem Relation Age of Onset   Rheum arthritis Mother    Migraines Mother    Hypertension Mother    Colitis Mother    Rheum arthritis Maternal Grandmother    Rheum arthritis Maternal Aunt    Migraines Sister    Migraines Brother    Sickle cell anemia Daughter    Bipolar disorder Daughter    Anxiety disorder Daughter    Depression Daughter    Migraines Daughter    Arrhythmia Daughter    Migraines Son    ADD / ADHD Son    Seizures Son    Arrhythmia Son    Past Surgical History:  Procedure Laterality Date   CESAREAN SECTION     TUBAL LIGATION     Social History   Social History Narrative   Not on file   Immunization History  Administered Date(s) Administered   PFIZER(Purple Top)SARS-COV-2 Vaccination 01/05/2020, 01/26/2020     Objective: Vital Signs: BP 128/85 (BP Location: Left Arm, Patient Position: Sitting, Cuff Size: Normal)   Pulse 69   Resp 15   Ht 5\' 4"  (1.626 m)   Wt 187 lb 6.4 oz (85 kg)   BMI 32.17 kg/m    Physical Exam Vitals and nursing note reviewed.  Constitutional:      Appearance: She is well-developed.  HENT:     Head: Normocephalic and atraumatic.  Eyes:     Conjunctiva/sclera: Conjunctivae normal.  Cardiovascular:     Rate and Rhythm: Normal rate and regular rhythm.     Heart sounds: Normal heart sounds.  Pulmonary:     Effort: Pulmonary effort is normal.     Breath sounds: Normal breath sounds.  Abdominal:     General: Bowel sounds are normal.     Palpations: Abdomen is soft.  Musculoskeletal:     Cervical back: Normal range of motion.  Lymphadenopathy:     Cervical: No cervical  adenopathy.  Skin:    General: Skin is warm and dry.     Capillary Refill: Capillary refill takes less than 2 seconds.  Neurological:     Mental Status: She is alert and oriented to person, place, and time.  Psychiatric:        Behavior: Behavior normal.      Musculoskeletal Exam: C-spine, thoracic spine, lumbar spine have good range of motion.  No midline spinal tenderness.  Trapezius muscle tension and tenderness on the left side.  Shoulder joints, elbow joints, wrist joints, MCPs, PIPs, DIPs have good range of motion with no synovitis.  Complete fist formation noted bilaterally.  Hip joints have good range of motion with no groin pain.  Knee joints have good range of motion with no warmth or effusion.  Ankle joints have good range of motion with no tenderness or joint swelling.  CDAI Exam: CDAI Score: -- Patient Global: 5 mm; Provider Global: 2 mm Swollen: --; Tender: -- Joint Exam 06/25/2022   No joint exam has been documented for this visit     Investigation: No additional findings.  Imaging: No results found.  Recent Labs: Lab Results  Component Value Date   WBC 6.8 02/05/2022   HGB 15.0 02/05/2022   PLT 338 02/05/2022   NA 139 02/05/2022   K 4.3 02/05/2022   CL 105 02/05/2022   CO2 24 02/05/2022   GLUCOSE 104 (H) 02/05/2022   BUN 12 02/05/2022   CREATININE 0.88 02/05/2022   BILITOT 0.6 02/05/2022   ALKPHOS 43 03/24/2021   AST 18 02/05/2022   ALT 20 02/05/2022   PROT 7.7 02/05/2022   ALBUMIN 3.6 03/24/2021   CALCIUM 9.4 02/05/2022   GFRAA 90 07/03/2020   QFTBGOLDPLUS NEGATIVE 02/05/2022    Speciality Comments: PLQ Eye Exam: WNL 03/24/17 @ [facility],PLQ-diarrhea Prior therapy: Humira (facial rash) Humira & Enbrel (stopped due to waning response) - Orencia (injection site reaction) one dose on 02/19/21 -- Simponi started 03/06/21  Procedures:  Trigger Point Inj  Date/Time: 06/25/2022 3:09 PM  Performed by: Gearldine Bienenstock, PA-C Authorized by: Gearldine Bienenstock, PA-C   Consent Given by:  Patient Site marked: the procedure site was marked   Timeout: prior to procedure the correct patient, procedure, and site was verified   Indications:  Pain Total # of Trigger Points:  1 Location: neck   Needle Size:  27 G Approach:  Dorsal (Left trapezius muscle) Medications #1:  0.5 mL lidocaine 1 %; 10 mg triamcinolone acetonide 40 MG/ML Patient tolerance:  Patient tolerated the procedure well with no immediate complications  Allergies: Amoxicillin-pot clavulanate, Cefuroxime, Doxycycline, Fluoxetine, Hydrocodone, Adalimumab, Hydrochlorothiazide, Hydrocodone-acetaminophen, Hydroxychloroquine, Other, and Vicodin [hydrocodone-acetaminophen]    Assessment / Plan:     Visit Diagnoses: Rheumatoid arthritis involving multiple sites with positive rheumatoid factor (HCC): She has no synovitis on examination today.  She has not had any signs or symptoms of a rheumatoid arthritis flare.  She has clinically been doing well on Simponi 50 mg subcutaneous injections every month.  She has been tolerating Simponi without any side effects or injection site reactions.  She has not missed any doses recently.  She has noticed clinical improvement while being on Simponi without any interruptions recently.  Her bilateral knee joint pain has improved.  She has no inflammation on examination today.  No medication changes will be made at this time.  She was advised to notify us if she develops signs or symptoms of a flare.  She will follow-up in the office in 5 months or sooner if needed.  High risk medication use - Simponi 50 mg sq injections every month. CBC and CMP updated on 02/05/22. Orders for CBC and CMP released today. Medical be due in August and every 3 months to monitor for drug toxicity. TB gold negative on 02/05/22.  No recent or recurrent infections. Discussed the importance of holding simponi if she develops signs or symptoms of an infection and to resume once the  infection has completely cleared.   - Plan: CBC with Differential/Platelet, COMPLETE METABOLIC PANEL WITH GFR  Chronic left shoulder pain: She has good range of motion of the left shoulder joint on examination today.  X-rays of the left shoulder were unremarkable from 01/26/2021.  Primary osteoarthritis of both hands: No tenderness or synovitis noted on examination today.  Complete fist formation noted bilaterally.  Primary osteoarthritis  of both knees: Good range of motion of both knee joints on examination today.  No warmth or effusion noted.  Her knee joint pain has improved since being on Simponi consistently.  Chondromalacia of both patellae  Primary osteoarthritis of both feet: She experiences some discomfort and stiffness in her feet if she is sitting or lying for prolonged periods of time.  Trapezius muscle spasm -She presents today with trapezius muscle tension and tenderness on the left side.  She has been experiencing muscle spasms intermittently.  A left trapezius trigger point injection was performed today.  She tolerated the procedure well.  Procedure note was completed above.  Aftercare was discussed.  She was advised to notify us if her symptoms persist or worsen.  Plan: Trigger Point Inj  DDD (degenerative disc disease), cervical: C-spine has good range of motion with some discomfort with lateral rotation to the left.  She has left trapezius muscle tension and tenderness.  A left trapezius trigger point injection was performed today.  Chronic midline low back pain without sciatica -X-rays of the lumbar spine were unremarkable on 02/05/2021.  Referred to Dr. Bunnie Pion on 02/10/2022.  Her symptoms have improved.  She did not proceed with an MRI of the lumbar spine.  Fibromyalgia: She experiences intermittent discomfort due to myofascial pain.  She presents today with trapezius muscle tension and tenderness on the left side.  She experiences muscle spasms intermittently.  After  informed consent the left trapezius muscle was injected with Kenalog/lidocaine.  She tolerated procedure well.  Procedure note was completed above.  Aftercare was discussed.  Other medical conditions are listed as follows:  Bipolar 2 disorder (HCC)  History of gastroesophageal reflux (GERD)  Essential hypertension: Blood pressure was 128/85 today in the office.  History of migraine  Cigarette smoker  Solitary pulmonary nodule  TB lung, latent  Encounter for removal and reinsertion of intrauterine contraceptive device (IUD) -Patient requested a referral to gynecology to proceed with removal and reinsertion of an IUD.  Plan: Ambulatory referral to Obstetrics / Gynecology  Orders: Orders Placed This Encounter  Procedures   Trigger Point Inj   CBC with Differential/Platelet   COMPLETE METABOLIC PANEL WITH GFR   Ambulatory referral to Obstetrics / Gynecology   No orders of the defined types were placed in this encounter.   Follow-Up Instructions: Return in about 5 months (around 11/25/2022) for Rheumatoid arthritis.   Gearldine Bienenstock, PA-C  Note - This record has been created using Dragon software.  Chart creation errors have been sought, but may not always  have been located. Such creation errors do not reflect on  the standard of medical care.

## 2022-06-17 ENCOUNTER — Other Ambulatory Visit (HOSPITAL_COMMUNITY): Payer: Self-pay

## 2022-06-25 ENCOUNTER — Encounter: Payer: Self-pay | Admitting: Physician Assistant

## 2022-06-25 ENCOUNTER — Ambulatory Visit: Payer: 59 | Attending: Physician Assistant | Admitting: Physician Assistant

## 2022-06-25 VITALS — BP 128/85 | HR 69 | Resp 15 | Ht 64.0 in | Wt 187.4 lb

## 2022-06-25 DIAGNOSIS — M2242 Chondromalacia patellae, left knee: Secondary | ICD-10-CM

## 2022-06-25 DIAGNOSIS — M25512 Pain in left shoulder: Secondary | ICD-10-CM

## 2022-06-25 DIAGNOSIS — F17210 Nicotine dependence, cigarettes, uncomplicated: Secondary | ICD-10-CM

## 2022-06-25 DIAGNOSIS — M0579 Rheumatoid arthritis with rheumatoid factor of multiple sites without organ or systems involvement: Secondary | ICD-10-CM

## 2022-06-25 DIAGNOSIS — S46812A Strain of other muscles, fascia and tendons at shoulder and upper arm level, left arm, initial encounter: Secondary | ICD-10-CM | POA: Diagnosis not present

## 2022-06-25 DIAGNOSIS — F3181 Bipolar II disorder: Secondary | ICD-10-CM

## 2022-06-25 DIAGNOSIS — M62838 Other muscle spasm: Secondary | ICD-10-CM | POA: Diagnosis not present

## 2022-06-25 DIAGNOSIS — M5030 Other cervical disc degeneration, unspecified cervical region: Secondary | ICD-10-CM

## 2022-06-25 DIAGNOSIS — M545 Low back pain, unspecified: Secondary | ICD-10-CM

## 2022-06-25 DIAGNOSIS — M19072 Primary osteoarthritis, left ankle and foot: Secondary | ICD-10-CM

## 2022-06-25 DIAGNOSIS — Z227 Latent tuberculosis: Secondary | ICD-10-CM

## 2022-06-25 DIAGNOSIS — Z79899 Other long term (current) drug therapy: Secondary | ICD-10-CM

## 2022-06-25 DIAGNOSIS — Z30433 Encounter for removal and reinsertion of intrauterine contraceptive device: Secondary | ICD-10-CM

## 2022-06-25 DIAGNOSIS — M2241 Chondromalacia patellae, right knee: Secondary | ICD-10-CM

## 2022-06-25 DIAGNOSIS — G8929 Other chronic pain: Secondary | ICD-10-CM

## 2022-06-25 DIAGNOSIS — M19041 Primary osteoarthritis, right hand: Secondary | ICD-10-CM

## 2022-06-25 DIAGNOSIS — M542 Cervicalgia: Secondary | ICD-10-CM | POA: Diagnosis not present

## 2022-06-25 DIAGNOSIS — R911 Solitary pulmonary nodule: Secondary | ICD-10-CM

## 2022-06-25 DIAGNOSIS — M19042 Primary osteoarthritis, left hand: Secondary | ICD-10-CM

## 2022-06-25 DIAGNOSIS — I10 Essential (primary) hypertension: Secondary | ICD-10-CM

## 2022-06-25 DIAGNOSIS — M170 Bilateral primary osteoarthritis of knee: Secondary | ICD-10-CM

## 2022-06-25 DIAGNOSIS — M797 Fibromyalgia: Secondary | ICD-10-CM

## 2022-06-25 DIAGNOSIS — Z8669 Personal history of other diseases of the nervous system and sense organs: Secondary | ICD-10-CM

## 2022-06-25 DIAGNOSIS — M19071 Primary osteoarthritis, right ankle and foot: Secondary | ICD-10-CM

## 2022-06-25 DIAGNOSIS — Z8719 Personal history of other diseases of the digestive system: Secondary | ICD-10-CM

## 2022-06-25 MED ORDER — LIDOCAINE HCL 1 % IJ SOLN
0.5000 mL | INTRAMUSCULAR | Status: AC | PRN
Start: 2022-06-25 — End: 2022-06-25
  Administered 2022-06-25: .5 mL

## 2022-06-25 MED ORDER — TRIAMCINOLONE ACETONIDE 40 MG/ML IJ SUSP
10.0000 mg | INTRAMUSCULAR | Status: AC | PRN
Start: 2022-06-25 — End: 2022-06-25
  Administered 2022-06-25: 10 mg via INTRAMUSCULAR

## 2022-06-25 NOTE — Patient Instructions (Signed)
Standing Labs We placed an order today for your standing lab work.   Please have your standing labs drawn in August and every 3 months   Please have your labs drawn 2 weeks prior to your appointment so that the provider can discuss your lab results at your appointment, if possible.  Please note that you may see your imaging and lab results in MyChart before we have reviewed them. We will contact you once all results are reviewed. Please allow our office up to 72 hours to thoroughly review all of the results before contacting the office for clarification of your results.  WALK-IN LAB HOURS  Monday through Thursday from 8:00 am -12:30 pm and 1:00 pm-5:00 pm and Friday from 8:00 am-12:00 pm.  Patients with office visits requiring labs will be seen before walk-in labs.  You may encounter longer than normal wait times. Please allow additional time. Wait times may be shorter on  Monday and Thursday afternoons.  We do not book appointments for walk-in labs. We appreciate your patience and understanding with our staff.   Labs are drawn by [facility]. Please bring your co-pay at the time of your lab draw.  You may receive a bill from [facility] for your lab work.  Please note if you are on Hydroxychloroquine and and an order has been placed for a Hydroxychloroquine level,  you will need to have it drawn 4 hours or more after your last dose.  If you wish to have your labs drawn at another location, please call the office 24 hours in advance so we can fax the orders.  The office is located at [address], [place], [place] [address]   If you have any questions regarding directions or hours of operation,  please call [phone].   As a reminder, please drink plenty of water prior to coming for your lab work. Thanks!

## 2022-06-26 LAB — CBC WITH DIFFERENTIAL/PLATELET
Absolute Monocytes: 543 cells/uL (ref 200–950)
Basophils Absolute: 46 cells/uL (ref 0–200)
Basophils Relative: 0.5 %
Eosinophils Absolute: 276 cells/uL (ref 15–500)
Eosinophils Relative: 3 %
HCT: 39.4 % (ref 35.0–45.0)
Hemoglobin: 13.5 g/dL (ref 11.7–15.5)
Lymphs Abs: 4020 cells/uL — ABNORMAL HIGH (ref 850–3900)
MCH: 29 pg (ref 27.0–33.0)
MCHC: 34.3 g/dL (ref 32.0–36.0)
MCV: 84.5 fL (ref 80.0–100.0)
MPV: 10.1 fL (ref 7.5–12.5)
Monocytes Relative: 5.9 %
Neutro Abs: 4315 cells/uL (ref 1500–7800)
Neutrophils Relative %: 46.9 %
Platelets: 342 10*3/uL (ref 140–400)
RBC: 4.66 10*6/uL (ref 3.80–5.10)
RDW: 13.8 % (ref 11.0–15.0)
Total Lymphocyte: 43.7 %
WBC: 9.2 10*3/uL (ref 3.8–10.8)

## 2022-06-26 LAB — COMPLETE METABOLIC PANEL WITH GFR
AG Ratio: 1.2 (calc) (ref 1.0–2.5)
ALT: 18 U/L (ref 6–29)
AST: 18 U/L (ref 10–30)
Albumin: 4.2 g/dL (ref 3.6–5.1)
Alkaline phosphatase (APISO): 57 U/L (ref 31–125)
BUN: 14 mg/dL (ref 7–25)
CO2: 24 mmol/L (ref 20–32)
Calcium: 9.5 mg/dL (ref 8.6–10.2)
Chloride: 107 mmol/L (ref 98–110)
Creat: 0.95 mg/dL (ref 0.50–0.99)
Globulin: 3.5 g/dL (calc) (ref 1.9–3.7)
Glucose, Bld: 93 mg/dL (ref 65–99)
Potassium: 3.8 mmol/L (ref 3.5–5.3)
Sodium: 140 mmol/L (ref 135–146)
Total Bilirubin: 0.4 mg/dL (ref 0.2–1.2)
Total Protein: 7.7 g/dL (ref 6.1–8.1)
eGFR: 77 mL/min/{1.73_m2} (ref >=60)

## 2022-06-26 NOTE — Progress Notes (Signed)
Absolute lymphocytes are borderline elevated. Rest of CBC WNL.  CMP WNL.

## 2022-06-30 ENCOUNTER — Other Ambulatory Visit: Payer: Self-pay

## 2022-07-09 ENCOUNTER — Other Ambulatory Visit: Payer: Self-pay | Admitting: Physician Assistant

## 2022-07-11 ENCOUNTER — Telehealth: Payer: Self-pay | Admitting: *Deleted

## 2022-07-11 ENCOUNTER — Other Ambulatory Visit: Payer: Self-pay | Admitting: Rheumatology

## 2022-07-11 NOTE — Telephone Encounter (Signed)
Please clarify where she had the injection? Has Dr. Christell Constant been managing her hip pain? She may benefit from physical therapy if there was no response to a cortisone injection.

## 2022-07-11 NOTE — Telephone Encounter (Signed)
Patient contacted the office stating she is currently having pain in her left hip. Patient states she had an injection in that hip but states that she has not gotten much relief. Patient states none of her other joints are effected. Patient states she uses the [facility].

## 2022-07-14 NOTE — Telephone Encounter (Signed)
Patient has seen Dr. Christell Constant at [facility] in the past.   Ok to schedule sooner follow up visit for further evaluation.

## 2022-07-14 NOTE — Progress Notes (Unsigned)
Office Visit Note  Patient: Elizabeth Pope             Date of Birth: 04/05/1980           MRN: [id number]             PCP: Knox Royalty, MD Referring: Knox Royalty, MD Visit Date: 07/15/2022 Occupation: @GUAROCC @  Subjective:  Left hip pain   History of Present Illness: Elizabeth Pope is a 42 y.o. female with history of rheumatoid arthritis.  She remains on simponi sq injections every month.  She continues to tolerate Simponi without any side effects or injection site reactions.  She denies missing any doses recently.  Patient presents today with ongoing discomfort in the left hip.  She has been having nocturnal pain when lying on her left side at night.  Patient states that about 1 week ago she developed a rash on the left side of her neck.  She states that she has not used any new products or topical agents.  She has been unable to identify a trigger for the rash.  She states that the rash is very itchy.  She has not tried using cortisone cream but has not noticed much improvement.  Activities of Daily Living:  Patient reports morning stiffness for 0 minutes.   Patient Reports nocturnal pain.  Difficulty dressing/grooming: Denies Difficulty climbing stairs: Reports Difficulty getting out of chair: Reports Difficulty using hands for taps, buttons, cutlery, and/or writing: Reports  Review of Systems  Constitutional:  Positive for fatigue.  HENT:  Positive for mouth dryness. Negative for mouth sores.   Eyes:  Positive for dryness.  Respiratory:  Negative for shortness of breath.   Cardiovascular:  Negative for chest pain and palpitations.  Gastrointestinal:  Negative for blood in stool, constipation and diarrhea.  Endocrine: Negative for increased urination.  Genitourinary:  Negative for involuntary urination.  Musculoskeletal:  Positive for joint pain, gait problem, joint pain, myalgias and myalgias. Negative for joint swelling, muscle weakness, morning stiffness  and muscle tenderness.  Skin:  Positive for rash. Negative for color change, hair loss and sensitivity to sunlight.  Allergic/Immunologic: Negative for susceptible to infections.  Neurological:  Positive for headaches. Negative for dizziness.  Hematological:  Negative for swollen glands.  Psychiatric/Behavioral:  Positive for depressed mood and sleep disturbance. The patient is nervous/anxious.     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   Pelvic pain 06/02/2017   Atypical squamous cell changes of undetermined significance (ASCUS) on cervical cytology with negative high risk human papilloma virus (HPV) test result 02/03/2017   Encounter for IUD insertion 01/28/2017   Uterine fibroid 01/21/2017   H/O tubal ligation 06/24/2016   High risk medication use 01/07/2016   GERD (gastroesophageal reflux disease) 12/25/2015   Migraines 12/25/2015   Hypertension 12/25/2015   RA (rheumatoid arthritis) (HCC) 12/25/2015   DDD (degenerative disc disease), cervical 12/25/2015   Osteoarthritis of both hands 12/25/2015   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/2015   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   TB lung, latent 05/14/2015   Bipolar 2 disorder (HCC) 05/14/2015   Cigarette smoker 05/05/2015   Chest pain 05/05/2015   Solitary pulmonary nodule 05/04/2015    Past Medical History:  Diagnosis Date   Anxiety    Bipolar 1 disorder (HCC)    DDD (degenerative disc disease), cervical 12/25/2015   Depression  GERD (gastroesophageal reflux disease) 12/25/2015   Hypertension 12/25/2015   Migraine    Migraines 12/25/2015   OCD (obsessive compulsive disorder)    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/2015   mild   Osteoarthritis of both hands 12/25/2015   PTSD (post-traumatic stress disorder)    RA (rheumatoid arthritis) (HCC) 12/25/2015   Positive RF, Elevated ESR, Positive  CCP > 250     Family History  Problem Relation Age of Onset   Rheum arthritis Mother    Migraines Mother    Hypertension Mother    Colitis Mother    Rheum arthritis Maternal Grandmother    Rheum arthritis Maternal Aunt    Migraines Sister    Migraines Brother    Sickle cell anemia Daughter    Bipolar disorder Daughter    Anxiety disorder Daughter    Depression Daughter    Migraines Daughter    Arrhythmia Daughter    Migraines Son    ADD / ADHD Son    Seizures Son    Arrhythmia Son    Past Surgical History:  Procedure Laterality Date   CESAREAN SECTION     TUBAL LIGATION     Social History   Social History Narrative   Not on file   Immunization History  Administered Date(s) Administered   PFIZER(Purple Top)SARS-COV-2 Vaccination 01/05/2020, 01/26/2020     Objective: Vital Signs: BP (!) 145/96 (BP Location: Left Arm, Patient Position: Sitting, Cuff Size: Normal)   Pulse (!) 54   Resp 14   Ht 5\' 4"  (1.626 m)   Wt 186 lb (84.4 kg)   BMI 31.93 kg/m    Physical Exam Vitals and nursing note reviewed.  Constitutional:      Appearance: She is well-developed.  HENT:     Head: Normocephalic and atraumatic.  Eyes:     Conjunctiva/sclera: Conjunctivae normal.  Cardiovascular:     Rate and Rhythm: Normal rate and regular rhythm.     Heart sounds: Normal heart sounds.  Pulmonary:     Effort: Pulmonary effort is normal.     Breath sounds: Normal breath sounds.  Abdominal:     General: Bowel sounds are normal.     Palpations: Abdomen is soft.  Musculoskeletal:     Cervical back: Normal range of motion.  Lymphadenopathy:     Cervical: No cervical adenopathy.  Skin:    General: Skin is warm and dry.     Capillary Refill: Capillary refill takes less than 2 seconds.  Neurological:     Mental Status: She is alert and oriented to person, place, and time.  Psychiatric:        Behavior: Behavior normal.      Musculoskeletal Exam: C-spine is slightly limited range  of motion.  Trapezius muscle tension tenderness bilaterally, left greater than right.  Left shoulder has limited range of motion with no discomfort.  Right shoulder has good range of motion.  Elbow joints, wrist joints, MCPs, PIPs, DIPs have good range of motion with no synovitis.  Complete fist formation bilaterally.  Hip joints have good range of motion with no groin pain.  Tenderness over the trochanteric bursa bilaterally, left greater than right.  No tenderness along the IT band.  Knee joints have good range of motion with no warmth or effusion.  Ankle joints have good range of motion with no tenderness or joint swelling.    CDAI Exam: CDAI Score: -- Patient Global: 1 mm; Provider Global: 1 mm Swollen: --; Tender: -- Joint Exam 07/15/2022  No joint exam has been documented for this visit   There is currently no information documented on the homunculus. Go to the Rheumatology activity and complete the homunculus joint exam.  Investigation: No additional findings.  Imaging: No results found.  Recent Labs: Lab Results  Component Value Date   WBC 9.2 06/25/2022   HGB 13.5 06/25/2022   PLT 342 06/25/2022   NA 140 06/25/2022   K 3.8 06/25/2022   CL 107 06/25/2022   CO2 24 06/25/2022   GLUCOSE 93 06/25/2022   BUN 14 06/25/2022   CREATININE 0.95 06/25/2022   BILITOT 0.4 06/25/2022   ALKPHOS 43 03/24/2021   AST 18 06/25/2022   ALT 18 06/25/2022   PROT 7.7 06/25/2022   ALBUMIN 3.6 03/24/2021   CALCIUM 9.5 06/25/2022   GFRAA 90 07/03/2020   QFTBGOLDPLUS NEGATIVE 02/05/2022    Speciality Comments: PLQ Eye Exam: WNL 03/24/17 @ [facility],PLQ-diarrhea Prior therapy: Humira (facial rash) Humira & Enbrel (stopped due to waning response) - Orencia (injection site reaction) one dose on 02/19/21 -- Simponi started 03/06/21  Procedures:  No procedures performed Allergies: Amoxicillin-pot clavulanate, Cefuroxime, Doxycycline, Fluoxetine, Hydrocodone, Adalimumab, Hydrochlorothiazide,  Hydrocodone-acetaminophen, Hydroxychloroquine, Other, and Vicodin [hydrocodone-acetaminophen]   Assessment / Plan:     Visit Diagnoses: Rheumatoid arthritis involving multiple sites with positive rheumatoid factor (HCC): She has no synovitis on examination today.  She has not had any signs or symptoms of a rheumatoid arthritis flare.  She has clinically been doing well on Simponi 50 mg sq injections once monthly.  She is tolerating Simponi without any side effects or injection site reactions.  She has not missed any doses recently.  She will remain on Simponi as prescribed.  She was advised to notify us if she develops signs or symptoms of a flare. She will follow up in 5 months or sooner if needed.   High risk medication use - Simponi 50 mg sq injections every month. TB gold negative on 02/05/22. CBC and CMP updated on 06/25/22. Her next lab work will be due in August and every 3 months.  Discussed the importance of holding simponi if she develops signs or symptoms of an infection and to resume once the infection has completely cleared.   Chronic left shoulder pain: Improved.  She had a left trapezius trigger point injection performed on 06/25/2022 which provided temporary relief.  She has been trying to improve her left shoulder range of motion.  Primary osteoarthritis of both hands: No tenderness or inflammation noted today.  Complete fist formation bilaterally.   Primary osteoarthritis of both knees: She has good range of motion of both knee joints on examination today.  No warmth or effusion noted.  Chondromalacia of both patellae: Good range of motion of both knee joints with no discomfort.  Primary osteoarthritis of both feet: Ankle joints have good range of motion with no tenderness or synovitis.  Trapezius muscle spasm: She has ongoing left trapezius muscle tension and tenderness bilaterally.  Her symptoms have been more manageable since having a trigger point injection performed on  06/25/2022.  DDD (degenerative disc disease), cervical: C-spine has slightly limited range of motion with lateral rotation to the left.  Left trapezius muscle tension and tenderness.  She had a left trapezius trigger point injection performed at her last office visit, which provided temporary relief.    Fibromyalgia: She continues to have generalized hyperalgesia and positive tender points on examination.  She has ongoing trapezius muscle tension tenderness bilaterally, left greater than right.  She also has some tenderness over the trochanteric bursa bilaterally, left greater than right.  She declined a left trochanteric bursa cortisone injection today.  Trochanteric bursitis, left hip: She presents today with some increased discomfort on the left side of her hip.  She has tenderness over the left trochanteric bursa but does not feel that this is where her pain level is originating.  She has good range of motion of the left hip joint on examination with no groin pain.  Patient declined a left trochanteric bursa cortisone injection at this time.  Rash: Patient has had a itchy papular rash for 1 week on her left upper hip.  No identifiable trigger.  She has been applying topical cortisone cream but continues to have itching and sensitivity.  No vesicular rash was noted today.  Patient was advised to follow-up with her PCP if the rash persists or worsens.  Other medical conditions are listed as follows:  Bipolar 2 disorder (HCC)  History of gastroesophageal reflux (GERD)  Essential hypertension: Blood pressure was elevated today in the office: 145/96.  She has not yet taken her antihypertensives.   History of migraine  Cigarette smoker  Solitary pulmonary nodule  TB lung, latent  Orders: No orders of the defined types were placed in this encounter.  No orders of the defined types were placed in this encounter.   Follow-Up Instructions: Return for Rheumatoid arthritis.   Gearldine Bienenstock,  PA-C  Note - This record has been created using Dragon software.  Chart creation errors have been sought, but may not always  have been located. Such creation errors do not reflect on  the standard of medical care.

## 2022-07-14 NOTE — Telephone Encounter (Signed)
Spoke with patient and she states that she had the injection in her shoulder here in our office by you. At that appointment she mentioned her left hip pain as well.  Patient states that the pain is a shooting pain that comes and goes. Patient states she does not know who Dr. Christell Constant is so he is not managing her hip pain. Please advise.

## 2022-07-14 NOTE — Telephone Encounter (Signed)
Spoke with patient and scheduled her for evaluation on 07/15/2022 at 8:40 am.

## 2022-07-15 ENCOUNTER — Encounter: Payer: Self-pay | Admitting: Physician Assistant

## 2022-07-15 ENCOUNTER — Ambulatory Visit: Payer: 59 | Attending: Physician Assistant | Admitting: Physician Assistant

## 2022-07-15 VITALS — BP 145/96 | HR 54 | Resp 14 | Ht 64.0 in | Wt 186.0 lb

## 2022-07-15 DIAGNOSIS — M19041 Primary osteoarthritis, right hand: Secondary | ICD-10-CM | POA: Diagnosis not present

## 2022-07-15 DIAGNOSIS — F3181 Bipolar II disorder: Secondary | ICD-10-CM

## 2022-07-15 DIAGNOSIS — Z79899 Other long term (current) drug therapy: Secondary | ICD-10-CM | POA: Diagnosis not present

## 2022-07-15 DIAGNOSIS — M5030 Other cervical disc degeneration, unspecified cervical region: Secondary | ICD-10-CM

## 2022-07-15 DIAGNOSIS — M170 Bilateral primary osteoarthritis of knee: Secondary | ICD-10-CM

## 2022-07-15 DIAGNOSIS — M25512 Pain in left shoulder: Secondary | ICD-10-CM

## 2022-07-15 DIAGNOSIS — Z8669 Personal history of other diseases of the nervous system and sense organs: Secondary | ICD-10-CM

## 2022-07-15 DIAGNOSIS — M0579 Rheumatoid arthritis with rheumatoid factor of multiple sites without organ or systems involvement: Secondary | ICD-10-CM | POA: Diagnosis not present

## 2022-07-15 DIAGNOSIS — M19042 Primary osteoarthritis, left hand: Secondary | ICD-10-CM

## 2022-07-15 DIAGNOSIS — M2242 Chondromalacia patellae, left knee: Secondary | ICD-10-CM

## 2022-07-15 DIAGNOSIS — M19072 Primary osteoarthritis, left ankle and foot: Secondary | ICD-10-CM

## 2022-07-15 DIAGNOSIS — R911 Solitary pulmonary nodule: Secondary | ICD-10-CM

## 2022-07-15 DIAGNOSIS — R21 Rash and other nonspecific skin eruption: Secondary | ICD-10-CM

## 2022-07-15 DIAGNOSIS — G8929 Other chronic pain: Secondary | ICD-10-CM

## 2022-07-15 DIAGNOSIS — Z8719 Personal history of other diseases of the digestive system: Secondary | ICD-10-CM

## 2022-07-15 DIAGNOSIS — M62838 Other muscle spasm: Secondary | ICD-10-CM

## 2022-07-15 DIAGNOSIS — I10 Essential (primary) hypertension: Secondary | ICD-10-CM

## 2022-07-15 DIAGNOSIS — F17210 Nicotine dependence, cigarettes, uncomplicated: Secondary | ICD-10-CM

## 2022-07-15 DIAGNOSIS — M19071 Primary osteoarthritis, right ankle and foot: Secondary | ICD-10-CM

## 2022-07-15 DIAGNOSIS — M2241 Chondromalacia patellae, right knee: Secondary | ICD-10-CM

## 2022-07-15 DIAGNOSIS — Z227 Latent tuberculosis: Secondary | ICD-10-CM

## 2022-07-15 DIAGNOSIS — M797 Fibromyalgia: Secondary | ICD-10-CM

## 2022-07-15 DIAGNOSIS — M7062 Trochanteric bursitis, left hip: Secondary | ICD-10-CM

## 2022-07-29 ENCOUNTER — Other Ambulatory Visit (HOSPITAL_COMMUNITY): Payer: Self-pay

## 2022-07-29 ENCOUNTER — Other Ambulatory Visit: Payer: Self-pay | Admitting: Rheumatology

## 2022-07-29 ENCOUNTER — Other Ambulatory Visit: Payer: Self-pay

## 2022-07-29 DIAGNOSIS — Z79899 Other long term (current) drug therapy: Secondary | ICD-10-CM

## 2022-07-29 DIAGNOSIS — M0579 Rheumatoid arthritis with rheumatoid factor of multiple sites without organ or systems involvement: Secondary | ICD-10-CM

## 2022-07-29 MED ORDER — SIMPONI 50 MG/0.5ML ~~LOC~~ SOAJ
50.0000 mg | SUBCUTANEOUS | 0 refills | Status: DC
  Filled 2022-07-29: qty 0.5, 35d supply, fill #0
  Filled 2022-09-05: qty 0.5, 35d supply, fill #1
  Filled 2022-11-03: qty 0.5, 35d supply, fill #2

## 2022-07-29 NOTE — Telephone Encounter (Signed)
Last Fill: 04/10/2022  Labs: 06/25/2022 Absolute lymphocytes are borderline elevated. Rest of CBC WNL.  CMP WNL.     TB Gold: 02/05/2022 Neg    Next Visit: 12/02/2022  Last Visit: 07/15/2022  [id number] arthritis involving multiple sites with positive rheumatoid factor   Current Dose per office note 07/15/2022: Simponi 50 mg sq injections every month   Okay to refill Simponi?

## 2022-07-30 ENCOUNTER — Other Ambulatory Visit (HOSPITAL_COMMUNITY): Payer: Self-pay

## 2022-08-04 ENCOUNTER — Other Ambulatory Visit: Payer: Self-pay | Admitting: Physician Assistant

## 2022-08-08 ENCOUNTER — Other Ambulatory Visit: Payer: Self-pay | Admitting: Physician Assistant

## 2022-08-11 ENCOUNTER — Other Ambulatory Visit (HOSPITAL_COMMUNITY): Payer: Self-pay

## 2022-08-12 ENCOUNTER — Other Ambulatory Visit (HOSPITAL_COMMUNITY): Payer: Self-pay

## 2022-08-12 ENCOUNTER — Other Ambulatory Visit: Payer: Self-pay

## 2022-08-13 ENCOUNTER — Other Ambulatory Visit: Payer: Self-pay

## 2022-08-13 ENCOUNTER — Other Ambulatory Visit (HOSPITAL_COMMUNITY): Payer: Self-pay

## 2022-08-20 ENCOUNTER — Other Ambulatory Visit: Payer: Self-pay

## 2022-08-26 ENCOUNTER — Other Ambulatory Visit (HOSPITAL_COMMUNITY): Payer: Self-pay

## 2022-09-02 ENCOUNTER — Other Ambulatory Visit (HOSPITAL_COMMUNITY): Payer: Self-pay

## 2022-09-03 ENCOUNTER — Other Ambulatory Visit: Payer: Self-pay | Admitting: Physician Assistant

## 2022-09-04 ENCOUNTER — Other Ambulatory Visit (HOSPITAL_COMMUNITY): Payer: Self-pay

## 2022-09-05 ENCOUNTER — Other Ambulatory Visit (HOSPITAL_COMMUNITY): Payer: Self-pay

## 2022-09-08 ENCOUNTER — Other Ambulatory Visit: Payer: Self-pay

## 2022-09-09 ENCOUNTER — Other Ambulatory Visit: Payer: Self-pay

## 2022-09-12 ENCOUNTER — Other Ambulatory Visit (HOSPITAL_COMMUNITY): Payer: Self-pay

## 2022-09-17 ENCOUNTER — Other Ambulatory Visit (HOSPITAL_COMMUNITY): Payer: Self-pay

## 2022-09-26 ENCOUNTER — Encounter: Payer: Self-pay | Admitting: Obstetrics and Gynecology

## 2022-09-26 ENCOUNTER — Other Ambulatory Visit (HOSPITAL_COMMUNITY)
Admission: RE | Admit: 2022-09-26 | Discharge: 2022-09-26 | Disposition: A | Discharge status: Home / Self Care | Payer: 59 | Source: Ambulatory Visit | Attending: Obstetrics and Gynecology | Admitting: Obstetrics and Gynecology

## 2022-09-26 ENCOUNTER — Ambulatory Visit (INDEPENDENT_AMBULATORY_CARE_PROVIDER_SITE_OTHER): Payer: 59 | Admitting: Obstetrics and Gynecology

## 2022-09-26 ENCOUNTER — Other Ambulatory Visit: Payer: Self-pay

## 2022-09-26 VITALS — BP 166/123 | HR 73 | Wt 182.0 lb

## 2022-09-26 DIAGNOSIS — Z30432 Encounter for removal of intrauterine contraceptive device: Secondary | ICD-10-CM

## 2022-09-26 DIAGNOSIS — N920 Excessive and frequent menstruation with regular cycle: Secondary | ICD-10-CM | POA: Insufficient documentation

## 2022-09-26 DIAGNOSIS — N924 Excessive bleeding in the premenopausal period: Secondary | ICD-10-CM

## 2022-09-26 DIAGNOSIS — Z01419 Encounter for gynecological examination (general) (routine) without abnormal findings: Secondary | ICD-10-CM | POA: Diagnosis present

## 2022-09-26 DIAGNOSIS — Z30433 Encounter for removal and reinsertion of intrauterine contraceptive device: Secondary | ICD-10-CM | POA: Diagnosis not present

## 2022-09-26 DIAGNOSIS — Z30430 Encounter for insertion of intrauterine contraceptive device: Secondary | ICD-10-CM

## 2022-09-26 DIAGNOSIS — Z1151 Encounter for screening for human papillomavirus (HPV): Secondary | ICD-10-CM | POA: Insufficient documentation

## 2022-09-26 DIAGNOSIS — Z124 Encounter for screening for malignant neoplasm of cervix: Secondary | ICD-10-CM

## 2022-09-26 HISTORY — PX: IUD REMOVAL: SHX5392

## 2022-09-26 MED ORDER — MISOPROSTOL 100 MCG PO TABS: ORAL_TABLET | ORAL | 0 refills | Status: AC

## 2022-09-26 MED ORDER — IBUPROFEN 800 MG PO TABS
800.0000 mg | ORAL_TABLET | Freq: Three times a day (TID) | ORAL | 1 refills | Status: DC | PRN
Start: 2022-09-26 — End: 2023-05-05

## 2022-09-26 NOTE — Progress Notes (Signed)
    GYNECOLOGY OFFICE PROCEDURE NOTE  Elizabeth Pope is a 42 y.o. [phone] here for  IUD removal. No GYN concerns.  Last pap smear was on 2020 and was normal.  Pap smear taken today in sterile fashion.  IUD Removal  Patient identified, informed consent performed, consent signed.  Patient was in the dorsal lithotomy position, normal external genitalia was noted.  A speculum was placed in the patient's vagina, normal discharge was noted, no lesions. The cervix was visualized, no lesions, no abnormal discharge.  The strings of the IUD were grasped and pulled using ring forceps. The IUD was removed in its entirety. Patient tolerated the procedure well.      GYNECOLOGY OFFICE PROCEDURE NOTE  IUD Insertion Procedure Note Patient identified, informed consent performed, consent signed.   Discussed risks of irregular bleeding, cramping, infection, malpositioning or misplacement of the IUD outside the uterus which may require further procedure such as laparoscopy. Also discussed >99% contraception efficacy, increased risk of ectopic pregnancy with failure of method.  Time out was performed.  Urine pregnancy test negative.  Speculum placed in the vagina.  Cervix visualized.  Cleaned with Betadine x 2.  Grasped anteriorly with a single tooth tenaculum.  Uterus sounded to 10 cm.  Attempted to place Liletta IUD.  Resistance was felt with patient discomfort noted.  Procedure stopped to attempt sequential dilation which was done.  Again tried to place IUD, and resistance again noted.  Procedure was discontinued .  Pt given option of expectant management regarding menstrual bleeding or repeat attempt with cervical ripening.  Pt desires cervical ripening.  Rx for cytotec and ibuprofen sent.  F/u in 2 weeks   [name], MD, FACOG Obstetrician & Gynecologist, [facility] for [employer], [facility]

## 2022-09-27 ENCOUNTER — Encounter (HOSPITAL_COMMUNITY): Payer: Self-pay

## 2022-09-27 ENCOUNTER — Emergency Department (HOSPITAL_COMMUNITY)
Admission: EM | Admit: 2022-09-27 | Discharge: 2022-09-27 | Disposition: A | Discharge status: Home / Self Care | Payer: 59 | Source: Home / Self Care | Attending: Emergency Medicine | Admitting: Emergency Medicine

## 2022-09-27 ENCOUNTER — Other Ambulatory Visit: Payer: Self-pay

## 2022-09-27 ENCOUNTER — Emergency Department (HOSPITAL_COMMUNITY): Payer: 59

## 2022-09-27 DIAGNOSIS — I10 Essential (primary) hypertension: Secondary | ICD-10-CM | POA: Diagnosis not present

## 2022-09-27 DIAGNOSIS — Z79899 Other long term (current) drug therapy: Secondary | ICD-10-CM | POA: Insufficient documentation

## 2022-09-27 DIAGNOSIS — R519 Headache, unspecified: Secondary | ICD-10-CM | POA: Diagnosis present

## 2022-09-27 DIAGNOSIS — R030 Elevated blood-pressure reading, without diagnosis of hypertension: Secondary | ICD-10-CM

## 2022-09-27 LAB — BASIC METABOLIC PANEL
Anion gap: 7 (ref 5–15)
BUN: 10 mg/dL (ref 6–20)
CO2: 25 mmol/L (ref 22–32)
Calcium: 9.2 mg/dL (ref 8.9–10.3)
Chloride: 105 mmol/L (ref 98–111)
Creatinine, Ser: 1.01 mg/dL — ABNORMAL HIGH (ref 0.44–1.00)
GFR, Estimated: >60 mL/min (ref >60)
Glucose, Bld: 77 mg/dL (ref 70–99)
Potassium: 3.6 mmol/L (ref 3.5–5.1)
Sodium: 137 mmol/L (ref 135–145)

## 2022-09-27 LAB — CBC
HCT: 40.8 % (ref 36.0–46.0)
Hemoglobin: 14.2 g/dL (ref 12.0–15.0)
MCH: 29.3 pg (ref 26.0–34.0)
MCHC: 34.8 g/dL (ref 30.0–36.0)
MCV: 84.1 fL (ref 80.0–100.0)
Platelets: 322 10*3/uL (ref 150–400)
RBC: 4.85 MIL/uL (ref 3.87–5.11)
RDW: 12.6 % (ref 11.5–15.5)
WBC: 6.6 10*3/uL (ref 4.0–10.5)
nRBC: 0 % (ref 0.0–0.2)

## 2022-09-27 LAB — HCG, SERUM, QUALITATIVE: Preg, Serum: NEGATIVE

## 2022-09-27 MED ORDER — KETOROLAC TROMETHAMINE 15 MG/ML IJ SOLN
15.0000 mg | Freq: Once | INTRAMUSCULAR | Status: AC
  Administered 2022-09-27: 15 mg via INTRAVENOUS
  Filled 2022-09-27: qty 1

## 2022-09-27 MED ORDER — PROCHLORPERAZINE EDISYLATE 10 MG/2ML IJ SOLN
10.0000 mg | Freq: Once | INTRAMUSCULAR | Status: AC
  Administered 2022-09-27: 10 mg via INTRAVENOUS
  Filled 2022-09-27: qty 2

## 2022-09-27 MED ORDER — DIPHENHYDRAMINE HCL 50 MG/ML IJ SOLN
12.5000 mg | Freq: Once | INTRAMUSCULAR | Status: AC
  Administered 2022-09-27: 12.5 mg via INTRAVENOUS
  Filled 2022-09-27: qty 1

## 2022-09-27 MED ORDER — IOHEXOL 350 MG/ML SOLN: 75.0000 mL | Freq: Once | INTRAVENOUS | Status: DC | PRN

## 2022-09-27 NOTE — ED Provider Notes (Signed)
[place] EMERGENCY DEPARTMENT AT [facility] Provider Note   CSN: [id number] Arrival date & time: 09/27/22  1137     History  Chief Complaint  Patient presents with   Hypertension   Headache    Elizabeth Pope is a 42 y.o. female with history of migraines, depression, anxiety, GERD, hypertension, bipolar disorder, rheumatoid arthritis, PTSD, OCD who presents the emergency department complaining of headache and elevated blood pressure.  For the past week patient states that she has been waking up with headaches almost daily.  She describes it as a throbbing "all over her head".  She does have some sensitivity to light and sound.  But she states that this one feels somewhat different.  She is also been having some intermittent blurry vision.  Yesterday she did have an episode of nausea and some tingling in her left hand, but this resolved.  She is supposed to be on amlodipine daily, and losartan at night.  She states that she has difficulty remembering taking her losartan at night.  He is also been trying to take Excedrin with minimal relief.   Hypertension Associated symptoms include headaches.  Headache Associated symptoms: photophobia        Home Medications Prior to Admission medications   Medication Sig Start Date End Date Taking? Authorizing Provider  acetaminophen (TYLENOL) 325 MG tablet Take 650 mg by mouth every 6 (six) hours as needed for moderate pain.    [provider]  albuterol (VENTOLIN HFA) 108 (90 Base) MCG/ACT inhaler Inhale 1-2 puffs into the lungs every 6 (six) hours as needed for wheezing or shortness of breath. 12/11/18   Linus Mako B, NP  amLODipine (NORVASC) 10 MG tablet Take 10 mg by mouth daily. 01/14/21   [provider]  atorvastatin (LIPITOR) 40 MG tablet Take 40 mg by mouth daily.    [provider]  cetirizine (ZYRTEC) 10 MG tablet Take 10 mg by mouth daily. 06/12/16   [provider]   diphenhydrAMINE (BENADRYL) 25 MG tablet Take 25 mg by mouth every 6 (six) hours as needed for itching.    [provider]  EPINEPHrine 0.3 mg/0.3 mL IJ SOAJ injection Inject 0.3 mg into the muscle once as needed for anaphylaxis. Patient not taking: Reported on 07/15/2022 10/27/19   [provider]  ergocalciferol (VITAMIN D2) 1.25 MG (50000 UT) capsule Take 50,000 Units by mouth once a week.    [provider]  esomeprazole (NEXIUM) 20 MG capsule 20 mg daily.    [provider]  Fluticasone-Salmeterol (ADVAIR) 100-50 MCG/DOSE AEPB Inhale 1 puff into the lungs 2 (two) times daily. Patient not taking: Reported on 09/26/2022    [provider]  Golimumab (SIMPONI) 50 MG/0.5ML SOAJ Inject 50 mg into the skin every 28 (twenty-eight) days. 07/29/22   Gearldine Bienenstock, PA-C  hydrOXYzine (ATARAX/VISTARIL) 25 MG tablet Take 25 mg by mouth 3 (three) times daily as needed for anxiety or itching.    [provider]  ibuprofen (ADVIL) 800 MG tablet Take 1 tablet (800 mg total) by mouth 3 (three) times daily with meals as needed for headache, moderate pain or cramping. 09/26/22   Warden Fillers, MD  lamoTRIgine (LAMICTAL) 100 MG tablet Take 100 mg by mouth daily. 03/18/16   [provider]  losartan (COZAAR) 50 MG tablet Take 50 mg by mouth daily. Patient not taking: Reported on 09/26/2022 02/21/20   [provider]  misoprostol (CYTOTEC) 100 MCG tablet 1/2 tab per  vagina the night before and the morning of the procedure 09/26/22   Warden Fillers, MD  montelukast (SINGULAIR) 10 MG tablet Take 10 mg by mouth daily.    [provider]  predniSONE (DELTASONE) 5 MG tablet Take 4 tablets by mouth daily x4 days, 3 tablets daily x4 days, 2 tablets daily x4 days, 1 tablet daily x4 days. Patient not taking: Reported on 06/25/2022 02/05/22   Gearldine Bienenstock, PA-C  RESTASIS 0.05 % ophthalmic emulsion Place 1 drop into both eyes 2 (two) times daily. 09/03/20    [provider]  sertraline (ZOLOFT) 100 MG tablet Take 100 mg by mouth daily. 12/16/16   [provider]  Abatacept (ORENCIA CLICKJECT) 125 MG/ML SOAJ Inject 125 mg into the skin once a week. 02/19/21 02/20/21  Pollyann Savoy, MD      Allergies    Amoxicillin-pot clavulanate, Cefuroxime, Doxycycline, Fluoxetine, Hydrocodone, Adalimumab, Hydrochlorothiazide, Hydrocodone-acetaminophen, Hydroxychloroquine, Other, and Vicodin [hydrocodone-acetaminophen]    Review of Systems   Review of Systems  Eyes:  Positive for photophobia.  Neurological:  Positive for headaches.  All other systems reviewed and are negative.   Physical Exam Updated Vital Signs BP (!) 171/115 (BP Location: Left Arm)   Pulse 66   Temp 98.4 F (36.9 C) (Oral)   Resp 16   Ht 5\' 3"  (1.6 m)   Wt 82.6 kg   LMP 08/25/2022 (Approximate)   SpO2 100%   BMI 32.24 kg/m  Physical Exam Vitals and nursing note reviewed.  Constitutional:      Appearance: Normal appearance.  HENT:     Head: Normocephalic and atraumatic.  Eyes:     Conjunctiva/sclera: Conjunctivae normal.  Cardiovascular:     Rate and Rhythm: Normal rate and regular rhythm.  Pulmonary:     Effort: Pulmonary effort is normal. No respiratory distress.     Breath sounds: Normal breath sounds.  Abdominal:     General: There is no distension.     Palpations: Abdomen is soft.     Tenderness: There is no abdominal tenderness.  Skin:    General: Skin is warm and dry.  Neurological:     General: No focal deficit present.     Mental Status: She is alert.     Comments: Neuro: Speech is clear, able to follow commands. CN III-XII intact grossly intact. PERRLA. EOMI. Sensation intact throughout. Str 5/5 all extremities.     ED Results / Procedures / Treatments   Labs (all labs ordered are listed, but only abnormal results are displayed) Labs Reviewed  BASIC METABOLIC PANEL - Abnormal; Notable for the following components:      Result  Value   Creatinine, Ser 1.01 (*)    All other components within normal limits  CBC  HCG, SERUM, QUALITATIVE    EKG None  Radiology CT Head Wo Contrast  Result Date: 09/27/2022 CLINICAL DATA:  Headache with increasing frequency. Elevated blood pressure. EXAM: CT HEAD WITHOUT CONTRAST TECHNIQUE: Contiguous axial images were obtained from the base of the skull through the vertex without intravenous contrast. RADIATION DOSE REDUCTION: This exam was performed according to the departmental dose-optimization program which includes automated exposure control, adjustment of the mA and/or kV according to patient size and/or use of iterative reconstruction technique. COMPARISON:  Brain MRI 08/26/2020 FINDINGS: Brain: No evidence of acute infarction, hemorrhage, hydrocephalus, extra-axial collection or mass lesion/mass effect. Vascular: No hyperdense vessel or unexpected calcification. Skull: Normal. Negative for fracture or focal lesion. Sinuses/Orbits: Moderate mucosal thickening is noted  involving the left maxillary sinus. Retention cyst is identified in the posterior right maxillary sinus. Mastoid air cells are clear. Other: None IMPRESSION: 1. No acute intracranial abnormality. 2. Moderate mucosal thickening involving the left maxillary sinus. Retention cyst is identified in the posterior right maxillary sinus. Electronically Signed   By: Signa Kell M.D.   On: 09/27/2022 14:06    Procedures Procedures    Medications Ordered in ED Medications  iohexol (OMNIPAQUE) 350 MG/ML injection 75 mL (has no administration in time range)  ketorolac (TORADOL) 15 MG/ML injection 15 mg (15 mg Intravenous Given 09/27/22 1304)  prochlorperazine (COMPAZINE) injection 10 mg (10 mg Intravenous Given 09/27/22 1304)  diphenhydrAMINE (BENADRYL) injection 12.5 mg (12.5 mg Intravenous Given 09/27/22 1304)    ED Course/ Medical Decision Making/ A&P                                 Medical Decision Making Amount and/or  Complexity of Data Reviewed Labs: ordered. Radiology: ordered.  Risk Prescription drug management.   This patient is a 42 y.o. female  who presents to the ED for concern of headache and elevated blood pressure.   Differential diagnoses prior to evaluation: The emergent differential diagnosis includes, but is not limited to,  Stroke, increased ICP, meningitis, CVA, intracranial tumor, venous sinus thrombosis, migraine, cluster headache, hypertension, drug related, head injury, tension headache, sinusitis, dental abscess, otitis media, TMJ. This is not an exhaustive differential.   Past Medical History / Co-morbidities / Social History: Migraines, depression, anxiety, GERD, hypertension, bipolar disorder, rheumatoid arthritis, PTSD, OCD  Physical Exam: Physical exam performed. The pertinent findings include: Hypertensive to 171/115, otherwise normal vital signs.  No acute distress.  Normal neurologic exam as above.  Lab Tests/Imaging studies: I personally interpreted labs/imaging and the pertinent results include: CBC and BMP unremarkable.  Creatinine mildly elevated, but not enough to be consistent with a kidney injury.  Negative pregnancy.  CT of the head unremarkable except for a retained maxillary sinus cyst. I agree with the radiologist interpretation.  Medications: I ordered medication including migraine cocktail including Toradol, Benadryl, Compazine.  I have reviewed the patients home medicines and have made adjustments as needed.  Upon reevaluation patient states that headache has almost resolved, and she is feeling much better.  She would like to go home.   Disposition: After consideration of the diagnostic results and the patients response to treatment, I feel that emergency department workup does not suggest an emergent condition requiring admission or immediate intervention beyond what has been performed at this time. The plan is: Discharged home with symptomatic management of  bad headache in the setting of elevated blood pressure reading.  Very low concern for hypertensive urgency or emergency as patient is now asymptomatic, and no evidence of endorgan dysfunction.  Will recommend routine adjustments to make sure she is getting her nightly dose of losartan and following up with her PCP regarding her medication management.  No other emergent etiology found for her symptoms today, suspect headache could be due to history of migraines.  The patient is safe for discharge and has been instructed to return immediately for worsening symptoms, change in symptoms or any other concerns.  Final Clinical Impression(s) / ED Diagnoses Final diagnoses:  Bad headache  Elevated blood pressure reading    Rx / DC Orders ED Discharge Orders     None      Portions of this report may  have been transcribed using voice recognition software. Every effort was made to ensure accuracy; however, inadvertent computerized transcription errors may be present.    Jeanella Flattery 09/27/22 1450    Lorre Nick, MD 09/28/22 [phone]

## 2022-09-27 NOTE — Discharge Instructions (Signed)
You were seen in the ER complaining of headache and elevated blood pressure.  As we discussed, your blood work and CT imaging of your head looked reassuring. I am glad your headache is improved with the medications we gave you.  Please follow up with your doctor regarding your blood pressure medication management.  Continue to monitor how you're doing and return to the ER for new or worsening symptoms.

## 2022-09-27 NOTE — ED Triage Notes (Signed)
For the past week pt has been waking up with headaches, yesterday day at her gynecologist noticed her blood pressure was elevated, pt also states that her vision feels blurry too. Blood pressure normally well controlled with BP medication. Stroke screen negative for any s/s.

## 2022-10-06 ENCOUNTER — Other Ambulatory Visit: Payer: Self-pay

## 2022-10-07 NOTE — Addendum Note (Signed)
Addended by: Guy Begin on: 10/07/2022 04:17 PM   Modules accepted: Orders

## 2022-10-08 ENCOUNTER — Other Ambulatory Visit: Payer: Self-pay

## 2022-10-10 DIAGNOSIS — Z30433 Encounter for removal and reinsertion of intrauterine contraceptive device: Secondary | ICD-10-CM | POA: Diagnosis not present

## 2022-10-10 MED ORDER — LEVONORGESTREL 20.1 MCG/DAY IU IUD
1.0000 | INTRAUTERINE_SYSTEM | Freq: Once | INTRAUTERINE | Status: AC
Start: 2022-10-10 — End: 2022-10-10
  Administered 2022-10-10: 1 via INTRAUTERINE

## 2022-10-10 NOTE — Addendum Note (Signed)
Addended by: Guy Begin on: 10/10/2022 09:08 AM   Modules accepted: Orders

## 2022-10-13 ENCOUNTER — Other Ambulatory Visit (HOSPITAL_COMMUNITY): Payer: Self-pay

## 2022-10-13 ENCOUNTER — Other Ambulatory Visit: Payer: Self-pay

## 2022-10-16 ENCOUNTER — Other Ambulatory Visit: Payer: Self-pay

## 2022-10-16 ENCOUNTER — Other Ambulatory Visit (HOSPITAL_COMMUNITY): Payer: Self-pay

## 2022-10-24 ENCOUNTER — Other Ambulatory Visit: Payer: Self-pay

## 2022-11-03 ENCOUNTER — Other Ambulatory Visit (HOSPITAL_COMMUNITY): Payer: Self-pay

## 2022-11-18 ENCOUNTER — Other Ambulatory Visit: Payer: Self-pay | Admitting: *Deleted

## 2022-11-18 MED ORDER — PREDNISONE 5 MG PO TABS: ORAL_TABLET | ORAL | 0 refills | Status: DC

## 2022-11-19 NOTE — Progress Notes (Signed)
Office Visit Note  Patient: Elizabeth Pope             Date of Birth: 10-Aug-1980           MRN: [id number]             PCP: Knox Royalty, MD Referring: Knox Royalty, MD Visit Date: 12/02/2022 Occupation: @GUAROCC @  Subjective:  Medication management  History of Present Illness: Elizabeth Pope is a 42 y.o. female with rheumatoid arthritis, osteoarthritis and degenerative disc disease.  She states she continues to have some discomfort in her joints.  She describes discomfort in her hands, lower back and her knees.  She continues to have discomfort in her left shoulder.  She has not noticed any joint swelling.  She has been taking Simponi injections on a monthly basis without any interruption.  She continues to have generalized pain and discomfort from fibromyalgia.                                                                                                Activities of Daily Living:  Patient reports morning stiffness for all day. Patient Reports nocturnal pain.  Difficulty dressing/grooming: Denies Difficulty climbing stairs: Reports Difficulty getting out of chair: Reports Difficulty using hands for taps, buttons, cutlery, and/or writing: Reports  Review of Systems  Constitutional:  Positive for fatigue.  HENT:  Positive for mouth dryness. Negative for mouth sores.   Eyes:  Positive for dryness.  Respiratory:  Positive for shortness of breath and wheezing. Negative for cough.   Cardiovascular:  Positive for palpitations. Negative for chest pain.  Gastrointestinal:  Positive for constipation. Negative for blood in stool and diarrhea.  Endocrine: Negative for increased urination.  Genitourinary:  Negative for involuntary urination.  Musculoskeletal:  Positive for joint pain, gait problem, joint pain, joint swelling, myalgias, muscle weakness, morning stiffness, muscle tenderness and myalgias.  Skin:  Negative for color change, rash, hair loss and sensitivity to  sunlight.  Allergic/Immunologic: Negative for susceptible to infections.  Neurological:  Positive for dizziness and headaches.  Hematological:  Negative for swollen glands.  Psychiatric/Behavioral:  Positive for depressed mood and sleep disturbance. The patient is nervous/anxious.     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   Pelvic pain 06/02/2017   Atypical squamous cell changes of undetermined significance (ASCUS) on cervical cytology with negative high risk human papilloma virus (HPV) test result 02/03/2017   Encounter for IUD insertion 01/28/2017   Uterine fibroid 01/21/2017   H/O tubal ligation 06/24/2016   High risk medication use 01/07/2016   GERD (gastroesophageal reflux disease) 12/25/2015   Migraines 12/25/2015   Hypertension 12/25/2015   RA (rheumatoid arthritis) (HCC) 12/25/2015   DDD (degenerative disc disease), cervical 12/25/2015   Osteoarthritis of both hands 12/25/2015   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/2015   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   TB lung, latent 05/14/2015   Bipolar 2 disorder (HCC) 05/14/2015   Cigarette smoker 05/05/2015  Chest pain 05/05/2015   Solitary pulmonary nodule 05/04/2015    Past Medical History:  Diagnosis Date   Anxiety    Bipolar 1 disorder (HCC)    DDD (degenerative disc disease), cervical 12/25/2015   Depression    GERD (gastroesophageal reflux disease) 12/25/2015   Hypertension 12/25/2015   Migraine    Migraines 12/25/2015   OCD (obsessive compulsive disorder)    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/2015   mild   Osteoarthritis of both hands 12/25/2015   PTSD (post-traumatic stress disorder)    RA (rheumatoid arthritis) (HCC) 12/25/2015   Positive RF, Elevated ESR, Positive CCP > 250     Family History  Problem Relation Age of Onset   Rheum arthritis Mother     Migraines Mother    Hypertension Mother    Colitis Mother    Migraines Sister    Migraines Brother    Rheum arthritis Maternal Aunt    Rheum arthritis Maternal Grandmother    Sickle cell anemia Daughter    Bipolar disorder Daughter    Anxiety disorder Daughter    Depression Daughter    Migraines Daughter    Arrhythmia Daughter    Migraines Son    ADD / ADHD Son    Seizures Son    Arrhythmia Son    Past Surgical History:  Procedure Laterality Date   CESAREAN SECTION     IUD REMOVAL  09/26/2022   TUBAL LIGATION     Social History   Social History Narrative   Not on file   Immunization History  Administered Date(s) Administered   PFIZER(Purple Top)SARS-COV-2 Vaccination 01/05/2020, 01/26/2020     Objective: Vital Signs: BP (!) 138/97 (BP Location: Left Arm, Patient Position: Sitting, Cuff Size: Normal)   Pulse 80   Resp 17   Ht 5\' 4"  (1.626 m)   Wt 183 lb (83 kg)   BMI 31.41 kg/m    Physical Exam Vitals and nursing note reviewed.  Constitutional:      Appearance: She is well-developed.  HENT:     Head: Normocephalic and atraumatic.  Eyes:     Conjunctiva/sclera: Conjunctivae normal.  Cardiovascular:     Rate and Rhythm: Normal rate and regular rhythm.     Heart sounds: Normal heart sounds.  Pulmonary:     Effort: Pulmonary effort is normal.     Breath sounds: Normal breath sounds.  Abdominal:     General: Bowel sounds are normal.     Palpations: Abdomen is soft.  Musculoskeletal:     Cervical back: Normal range of motion.  Lymphadenopathy:     Cervical: No cervical adenopathy.  Skin:    General: Skin is warm and dry.     Capillary Refill: Capillary refill takes less than 2 seconds.  Neurological:     Mental Status: She is alert and oriented to person, place, and time.  Psychiatric:        Behavior: Behavior normal.      Musculoskeletal Exam: She had good range of motion of the cervical spine.  She had bilateral trapezius spasm.  Shoulder joints  were in good range of motion with some limitation with internal rotation of the left shoulder joint.  Elbow joints, wrist joints, MCPs PIPs and DIPs with good range of motion with no synovitis.  Hip joints and knee joints were in good range of motion.  She had no tenderness over trochanteric region.  Knee joints in good range of motion without any warmth swelling or effusion.  There  was no tenderness over ankles or MTPs.  CDAI Exam: CDAI Score: -- Patient Global: 10 / 100; Provider Global: 10 / 100 Swollen: --; Tender: -- Joint Exam 12/02/2022   No joint exam has been documented for this visit   There is currently no information documented on the homunculus. Go to the Rheumatology activity and complete the homunculus joint exam.  Investigation: No additional findings.  Imaging: No results found.  Recent Labs: Lab Results  Component Value Date   WBC 6.6 09/27/2022   HGB 14.2 09/27/2022   PLT 322 09/27/2022   NA 137 09/27/2022   K 3.6 09/27/2022   CL 105 09/27/2022   CO2 25 09/27/2022   GLUCOSE 77 09/27/2022   BUN 10 09/27/2022   CREATININE 1.01 (H) 09/27/2022   BILITOT 0.4 06/25/2022   ALKPHOS 43 03/24/2021   AST 18 06/25/2022   ALT 18 06/25/2022   PROT 7.7 06/25/2022   ALBUMIN 3.6 03/24/2021   CALCIUM 9.2 09/27/2022   GFRAA 90 07/03/2020   QFTBGOLDPLUS NEGATIVE 02/05/2022    Speciality Comments: PLQ Eye Exam: WNL 03/24/17 @ [facility],PLQ-diarrhea Prior therapy: Humira (facial rash) Humira & Enbrel (stopped due to waning response) - Orencia (injection site reaction) one dose on 02/19/21 -- Simponi started 03/06/21  Procedures:  No procedures performed Allergies: Amoxicillin-pot clavulanate, Cefuroxime, Doxycycline, Fluoxetine, Hydrocodone, Adalimumab, Hydrochlorothiazide, Hydrocodone-acetaminophen, Hydroxychloroquine, Other, and Vicodin [hydrocodone-acetaminophen]   Assessment / Plan:     Visit Diagnoses: Rheumatoid arthritis involving multiple sites with positive  rheumatoid factor (HCC)-she continues to have pain and discomfort in multiple joints.  No synovitis was noted on the examination.  She has been taking Simponi 50 mg subcu monthly without any interruption.  She denies any side effects from Simponi.  No change in treatment advised.  High risk medication use - Simponi 50 mg sq injections every month. - Plan: CBC with Differential/Platelet, COMPLETE METABOLIC PANEL WITH GFR today and then every 3 months to monitor for drug toxicity.  TB Gold will be done with the next labs.  Information on immunization was placed in the AVS.  She was advised to hold Simponi injections if she develops an infection resume after the infection resolves.  Annual skin examination to screen for skin cancer was advised.  Use of sunscreen and sun protection was advised.  Chronic left shoulder pain -she complains of left shoulder joint pain.  She had good range of motion with some limitation with internal rotation.  She had a left trapezius trigger point injection performed on 06/25/2022 which provided temporary relief.  Primary osteoarthritis of both hands-she complains of pain and discomfort in her bilateral hands.  No synovitis was noted.  Primary osteoarthritis of both knees-she complains of discomfort in her knee joints.  No warmth swelling or effusion was noted.  Chondromalacia of both patellae-lower extremity muscle strengthening exercises were emphasized.  Primary osteoarthritis of both feet-proper fitting  Trapezius muscle spasm-she continues to have bilateral trapezius spasm and discomfort in her neck.   DDD (degenerative disc disease), cervical-she had good range of motion of the cervical spine with some discomfort on lateral rotation.  Fibromyalgia-she has generalized pain and discomfort from fibromyalgia.  She had positive tender point and hyperalgesia.  Need for regular exercise and stretching was emphasized.  Bipolar 2 disorder (HCC)-she is currently not taking  any treatment.  I advised her to schedule an appointment with the psychiatrist.  Going on SSRIs will also help her generalized pain.  Other medical problems are listed as follows:  History of gastroesophageal reflux (GERD)  History of migraine  Essential hypertension-blood pressure was elevated at 133/94.  Repeat blood pressure was 138/97.  She was advised to monitor blood pressure closely and follow-up with her PCP.  Former smoker - 1PPDx 15 years, quit smoking 01/23/17  Solitary pulmonary nodule  TB lung, latent  Orders: Orders Placed This Encounter  Procedures   CBC with Differential/Platelet   COMPLETE METABOLIC PANEL WITH GFR   No orders of the defined types were placed in this encounter.    Follow-Up Instructions: Return in about 5 months (around 05/02/2023) for Rheumatoid arthritis, Osteoarthritis.   Pollyann Savoy, MD  Note - This record has been created using Animal nutritionist.  Chart creation errors have been sought, but may not always  have been located. Such creation errors do not reflect on  the standard of medical care.

## 2022-12-02 ENCOUNTER — Ambulatory Visit: Payer: 59 | Attending: Rheumatology | Admitting: Rheumatology

## 2022-12-02 ENCOUNTER — Encounter: Payer: Self-pay | Admitting: Rheumatology

## 2022-12-02 VITALS — BP 138/97 | HR 80 | Resp 17 | Ht 64.0 in | Wt 183.0 lb

## 2022-12-02 DIAGNOSIS — Z79899 Other long term (current) drug therapy: Secondary | ICD-10-CM | POA: Diagnosis not present

## 2022-12-02 DIAGNOSIS — M797 Fibromyalgia: Secondary | ICD-10-CM

## 2022-12-02 DIAGNOSIS — M19041 Primary osteoarthritis, right hand: Secondary | ICD-10-CM

## 2022-12-02 DIAGNOSIS — M62838 Other muscle spasm: Secondary | ICD-10-CM

## 2022-12-02 DIAGNOSIS — F3181 Bipolar II disorder: Secondary | ICD-10-CM

## 2022-12-02 DIAGNOSIS — M7062 Trochanteric bursitis, left hip: Secondary | ICD-10-CM

## 2022-12-02 DIAGNOSIS — Z227 Latent tuberculosis: Secondary | ICD-10-CM

## 2022-12-02 DIAGNOSIS — M0579 Rheumatoid arthritis with rheumatoid factor of multiple sites without organ or systems involvement: Secondary | ICD-10-CM | POA: Diagnosis not present

## 2022-12-02 DIAGNOSIS — M2241 Chondromalacia patellae, right knee: Secondary | ICD-10-CM

## 2022-12-02 DIAGNOSIS — M25512 Pain in left shoulder: Secondary | ICD-10-CM | POA: Diagnosis not present

## 2022-12-02 DIAGNOSIS — R911 Solitary pulmonary nodule: Secondary | ICD-10-CM

## 2022-12-02 DIAGNOSIS — G8929 Other chronic pain: Secondary | ICD-10-CM

## 2022-12-02 DIAGNOSIS — Z8669 Personal history of other diseases of the nervous system and sense organs: Secondary | ICD-10-CM

## 2022-12-02 DIAGNOSIS — Z8719 Personal history of other diseases of the digestive system: Secondary | ICD-10-CM

## 2022-12-02 DIAGNOSIS — M19071 Primary osteoarthritis, right ankle and foot: Secondary | ICD-10-CM

## 2022-12-02 DIAGNOSIS — Z87891 Personal history of nicotine dependence: Secondary | ICD-10-CM

## 2022-12-02 DIAGNOSIS — M19042 Primary osteoarthritis, left hand: Secondary | ICD-10-CM

## 2022-12-02 DIAGNOSIS — M2242 Chondromalacia patellae, left knee: Secondary | ICD-10-CM

## 2022-12-02 DIAGNOSIS — M5030 Other cervical disc degeneration, unspecified cervical region: Secondary | ICD-10-CM

## 2022-12-02 DIAGNOSIS — F17210 Nicotine dependence, cigarettes, uncomplicated: Secondary | ICD-10-CM

## 2022-12-02 DIAGNOSIS — M19072 Primary osteoarthritis, left ankle and foot: Secondary | ICD-10-CM

## 2022-12-02 DIAGNOSIS — R21 Rash and other nonspecific skin eruption: Secondary | ICD-10-CM

## 2022-12-02 DIAGNOSIS — I10 Essential (primary) hypertension: Secondary | ICD-10-CM

## 2022-12-02 DIAGNOSIS — M170 Bilateral primary osteoarthritis of knee: Secondary | ICD-10-CM

## 2022-12-02 NOTE — Patient Instructions (Signed)
Standing Labs We placed an order today for your standing lab work.   Please have your standing labs drawn in January and every 3 months TB Gold with next labs  Please have your labs drawn 2 weeks prior to your appointment so that the provider can discuss your lab results at your appointment, if possible.  Please note that you may see your imaging and lab results in MyChart before we have reviewed them. We will contact you once all results are reviewed. Please allow our office up to 72 hours to thoroughly review all of the results before contacting the office for clarification of your results.  WALK-IN LAB HOURS  Monday through Thursday from 8:00 am -12:30 pm and 1:00 pm-5:00 pm and Friday from 8:00 am-12:00 pm.  Patients with office visits requiring labs will be seen before walk-in labs.  You may encounter longer than normal wait times. Please allow additional time. Wait times may be shorter on  Monday and Thursday afternoons.  We do not book appointments for walk-in labs. We appreciate your patience and understanding with our staff.   Labs are drawn by [facility]. Please bring your co-pay at the time of your lab draw.  You may receive a bill from [facility] for your lab work.  Please note if you are on Hydroxychloroquine and and an order has been placed for a Hydroxychloroquine level,  you will need to have it drawn 4 hours or more after your last dose.  If you wish to have your labs drawn at another location, please call the office 24 hours in advance so we can fax the orders.  The office is located at [address], [place], [place] [address]   If you have any questions regarding directions or hours of operation,  please call [phone].   As a reminder, please drink plenty of water prior to coming for your lab work. Thanks!   Vaccines You are taking a medication(s) that can suppress your immune system.  The following immunizations are recommended: Flu annually Covid-19   RSV Td/Tdap (tetanus, diphtheria, pertussis) every 10 years Pneumonia (Prevnar 15 then Pneumovax 23 at least 1 year apart.  Alternatively, can take Prevnar 20 without needing additional dose) Shingrix: 2 doses from 4 weeks to 6 months apart  Please check with your PCP to make sure you are up to date.   If you have signs or symptoms of an infection or start antibiotics: First, call your PCP for workup of your infection. Hold your medication through the infection, until you complete your antibiotics, and until symptoms resolve if you take the following: Injectable medication (Actemra, Benlysta, Cimzia, Cosentyx, Enbrel, Humira, Kevzara, Orencia, Remicade, Simponi, Stelara, Taltz, Tremfya) Methotrexate Leflunomide (Arava) Mycophenolate (Cellcept) Harriette Ohara, Olumiant, or Rinvoq  Please get an annual skin examination to screen for skin cancer while you are on Simponi injections.  Please use sunscreen and sun protection.

## 2022-12-03 LAB — COMPLETE METABOLIC PANEL WITH GFR
AG Ratio: 1.1 (calc) (ref 1.0–2.5)
ALT: 12 U/L (ref 6–29)
AST: 11 U/L (ref 10–30)
Albumin: 3.9 g/dL (ref 3.6–5.1)
Alkaline phosphatase (APISO): 52 U/L (ref 31–125)
BUN: 14 mg/dL (ref 7–25)
CO2: 26 mmol/L (ref 20–32)
Calcium: 9.6 mg/dL (ref 8.6–10.2)
Chloride: 107 mmol/L (ref 98–110)
Creat: 0.93 mg/dL (ref 0.50–0.99)
Globulin: 3.5 g/dL (ref 1.9–3.7)
Glucose, Bld: 85 mg/dL (ref 65–99)
Potassium: 3.8 mmol/L (ref 3.5–5.3)
Sodium: 141 mmol/L (ref 135–146)
Total Bilirubin: 0.4 mg/dL (ref 0.2–1.2)
Total Protein: 7.4 g/dL (ref 6.1–8.1)
eGFR: 79 mL/min/{1.73_m2} (ref >=60)

## 2022-12-03 LAB — CBC WITH DIFFERENTIAL/PLATELET
Absolute Monocytes: 732 {cells}/uL (ref 200–950)
Basophils Absolute: 50 {cells}/uL (ref 0–200)
Basophils Relative: 0.4 %
Eosinophils Absolute: 236 {cells}/uL (ref 15–500)
Eosinophils Relative: 1.9 %
HCT: 38.9 % (ref 35.0–45.0)
Hemoglobin: 13.1 g/dL (ref 11.7–15.5)
Lymphs Abs: 5456 {cells}/uL — ABNORMAL HIGH (ref 850–3900)
MCH: 29.9 pg (ref 27.0–33.0)
MCHC: 33.7 g/dL (ref 32.0–36.0)
MCV: 88.8 fL (ref 80.0–100.0)
MPV: 9.9 fL (ref 7.5–12.5)
Monocytes Relative: 5.9 %
Neutro Abs: 5927 {cells}/uL (ref 1500–7800)
Neutrophils Relative %: 47.8 %
Platelets: 345 10*3/uL (ref 140–400)
RBC: 4.38 10*6/uL (ref 3.80–5.10)
RDW: 14.5 % (ref 11.0–15.0)
Total Lymphocyte: 44 %
WBC: 12.4 10*3/uL — ABNORMAL HIGH (ref 3.8–10.8)

## 2022-12-03 NOTE — Progress Notes (Signed)
CMP is normal.  CBC shows elevated white cell count.  Please ask patient of recent steroid use or  recent infection.

## 2022-12-08 ENCOUNTER — Other Ambulatory Visit (HOSPITAL_COMMUNITY): Payer: Self-pay | Admitting: Pharmacy Technician

## 2022-12-08 ENCOUNTER — Other Ambulatory Visit: Payer: Self-pay

## 2022-12-08 ENCOUNTER — Other Ambulatory Visit: Payer: Self-pay | Admitting: Physician Assistant

## 2022-12-08 ENCOUNTER — Other Ambulatory Visit (HOSPITAL_COMMUNITY): Payer: Self-pay

## 2022-12-08 DIAGNOSIS — M0579 Rheumatoid arthritis with rheumatoid factor of multiple sites without organ or systems involvement: Secondary | ICD-10-CM

## 2022-12-08 DIAGNOSIS — Z79899 Other long term (current) drug therapy: Secondary | ICD-10-CM

## 2022-12-08 MED ORDER — SIMPONI 50 MG/0.5ML ~~LOC~~ SOAJ
50.0000 mg | SUBCUTANEOUS | 0 refills | Status: DC
Start: 2022-12-08 — End: 2023-05-04
  Filled 2022-12-08: qty 0.5, 35d supply, fill #0

## 2022-12-08 NOTE — Telephone Encounter (Signed)
Last Fill: 07/29/2022  Labs: 12/02/2022 CMP is normal.  CBC shows elevated white cell count.  Please ask patient of recent steroid use or  recent infection.   TB Gold: 02/05/2022   TB gold negative   Next Visit: 05/04/2023  Last Visit: 12/02/2022   [id number] arthritis involving multiple sites with positive rheumatoid factor    Current Dose per office note 12/02/2022: Simponi 50 mg sq injections every month   Okay to refill Simponi?

## 2022-12-08 NOTE — Progress Notes (Signed)
Specialty Pharmacy Refill Coordination Note  Elizabeth Pope is a 42 y.o. female contacted today regarding refills of specialty medication(s) Golimumab   Patient requested Delivery   Delivery date: 12/23/22   Verified address: [address]  [place]   Medication will be filled on 03/23/22.  Refill Request sent to MD; call if any delays

## 2022-12-12 ENCOUNTER — Ambulatory Visit (INDEPENDENT_AMBULATORY_CARE_PROVIDER_SITE_OTHER): Payer: 59 | Admitting: Family Medicine

## 2022-12-12 ENCOUNTER — Encounter: Payer: Self-pay | Admitting: Family Medicine

## 2022-12-12 VITALS — BP 129/80 | HR 85 | Ht 64.0 in | Wt 186.6 lb

## 2022-12-12 DIAGNOSIS — Z1330 Encounter for screening examination for mental health and behavioral disorders, unspecified: Secondary | ICD-10-CM | POA: Diagnosis not present

## 2022-12-12 DIAGNOSIS — Z30430 Encounter for insertion of intrauterine contraceptive device: Secondary | ICD-10-CM | POA: Diagnosis not present

## 2022-12-12 LAB — POCT PREGNANCY, URINE: Preg Test, Ur: NEGATIVE

## 2022-12-12 MED ORDER — LEVONORGESTREL 20.1 MCG/DAY IU IUD
1.0000 | INTRAUTERINE_SYSTEM | Freq: Once | INTRAUTERINE | Status: AC
Start: 2022-12-12 — End: 2022-12-12
  Administered 2022-12-12: 1 via INTRAUTERINE

## 2022-12-12 NOTE — Progress Notes (Signed)
    GYNECOLOGY OFFICE PROCEDURE NOTE  Nadirah Finnen is a 42 y.o. [phone] here for Liletta IUD insertion. No GYN concerns.  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  IUD Insertion Procedure Note Patient identified, informed consent performed, consent signed.   Discussed risks of irregular bleeding, increased cramping, infection, malpositioning or misplacement of the IUD outside the uterus which may require further procedure such as laparoscopy. Also discussed >99% contraception efficacy, increased risk of ectopic pregnancy with failure of method.  Time out was performed.  Speculum placed in the vagina.  Cervix visualized.  Cleaned with Betadine x 2.  Grasped anteriorly with a single tooth tenaculum.  Uterus sounded to 9 cm. IUD placed per manufacturer's recommendations.  Strings trimmed to 4 cm. Tenaculum was removed, good hemostasis noted.  Patient tolerated procedure well.   Patient was given post-procedure instructions.  She was advised to have backup contraception for one week.  Patient was also asked to check IUD strings periodically and follow up in 4 weeks for IUD check and colposcopy.  Venora Maples, MD/MPH Attending Family Medicine Physician, [facility] for [facility], [facility]

## 2022-12-15 ENCOUNTER — Telehealth: Payer: Self-pay | Admitting: *Deleted

## 2022-12-15 NOTE — Telephone Encounter (Signed)
Patient contacted the office and left message stating she is having tingling in her fingers and toes. Patient states they "feel like they are on fire". Patient would like to know if there is something she can take or something she can do for this. Please advise.

## 2022-12-15 NOTE — Telephone Encounter (Signed)
Patient advised she may require a workup for neuropathy if her symptoms have been persistent. Elizabeth Pope would recommend an evaluation by PCP

## 2022-12-15 NOTE — Telephone Encounter (Signed)
She may require a workup for neuropathy if her symptoms have been persistent.  I would recommend an evaluation by PCP

## 2023-01-09 ENCOUNTER — Ambulatory Visit (INDEPENDENT_AMBULATORY_CARE_PROVIDER_SITE_OTHER): Payer: 59 | Admitting: Family Medicine

## 2023-01-09 ENCOUNTER — Encounter: Payer: Self-pay | Admitting: Family Medicine

## 2023-01-09 ENCOUNTER — Other Ambulatory Visit (HOSPITAL_COMMUNITY)
Admission: RE | Admit: 2023-01-09 | Discharge: 2023-01-09 | Disposition: A | Discharge status: Home / Self Care | Payer: 59 | Source: Ambulatory Visit | Attending: Family Medicine | Admitting: Family Medicine

## 2023-01-09 VITALS — BP 165/119 | HR 70 | Wt 186.0 lb

## 2023-01-09 DIAGNOSIS — Z01818 Encounter for other preprocedural examination: Secondary | ICD-10-CM

## 2023-01-09 DIAGNOSIS — R87612 Low grade squamous intraepithelial lesion on cytologic smear of cervix (LGSIL): Secondary | ICD-10-CM | POA: Diagnosis present

## 2023-01-09 DIAGNOSIS — N870 Mild cervical dysplasia: Secondary | ICD-10-CM

## 2023-01-09 DIAGNOSIS — N72 Inflammatory disease of cervix uteri: Secondary | ICD-10-CM | POA: Diagnosis not present

## 2023-01-09 DIAGNOSIS — D250 Submucous leiomyoma of uterus: Secondary | ICD-10-CM

## 2023-01-09 DIAGNOSIS — N858 Other specified noninflammatory disorders of uterus: Secondary | ICD-10-CM

## 2023-01-09 DIAGNOSIS — B977 Papillomavirus as the cause of diseases classified elsewhere: Secondary | ICD-10-CM | POA: Diagnosis not present

## 2023-01-09 LAB — POCT PREGNANCY, URINE: Preg Test, Ur: NEGATIVE

## 2023-01-09 NOTE — Progress Notes (Signed)
    GYNECOLOGY CLINIC COLPOSCOPY PROCEDURE NOTE  42 y.o. 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  Pregnancy test: Lab Results  Component Value Date   PREGTESTUR NEGATIVE 12/12/2022    Allergies  Allergen Reactions   Amoxicillin-Pot Clavulanate     Diarrhea    Cefuroxime    Doxycycline     Diarrhea    Fluoxetine     Muscle spasms Other reaction(s): Other (See Comments) Muscle spasms   Hydrocodone Itching and Other (See Comments)   Adalimumab Rash and Other (See Comments)   Hydrochlorothiazide Rash and Other (See Comments)   Hydrocodone-Acetaminophen Itching   Hydroxychloroquine Diarrhea    diarrhea diarrhea   Other Rash   Vicodin [Hydrocodone-Acetaminophen] Itching    Patient given informed consent, signed copy in the chart, time out was performed.    Placed in lithotomy position. Cervix viewed with speculum and colposcope after application of acetic acid.   Colposcopy Adequacy Cervix fully visualized: Yes  SCJ fully visualized: No    Colposcopy Findings faint acetowhite lesion(s) noted at 3 and 9 o'clock  Corresponding biopsies were obtained.    ECC specimen was not obtained.  All specimens were labeled and sent to pathology.  Hemostatic measures: Pressure and Monsel's solution  Complications: none  Patient tolerated the procedure well.  OBGyn Exam  Colposcopy Impressions Low grade features  Plan Treatment plan pending biopsy results, per patient preference they will be communicated by MyChart.  Patient was given post procedure instructions.  Will follow up pathology and  manage accordingly; patient will be contacted with results and recommendations.  Routine preventative health maintenance measures emphasized.  Venora Maples, MD/MPH Attending Family Medicine Physician, [facility] for [facility], [facility]

## 2023-01-13 LAB — SURGICAL PATHOLOGY

## 2023-01-15 NOTE — Progress Notes (Signed)
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 Reviewed recommendation for repeat pap in 1 year, HM modifier updated. Reports no significant symptoms of cervicitis, discussed this is likely a benign finding with no specific treatment or workup needed at present.

## 2023-01-16 ENCOUNTER — Other Ambulatory Visit: Payer: Self-pay

## 2023-01-19 ENCOUNTER — Other Ambulatory Visit: Payer: Self-pay

## 2023-01-26 ENCOUNTER — Telehealth: Payer: Self-pay | Admitting: *Deleted

## 2023-01-26 NOTE — Telephone Encounter (Signed)
Patient contacted the office stating she was having pain in her hip and pelvic bone. Patient states she is having trouble walking. Patient states she is taking Ibuprofen but it is not helping. Patient is asking for pain medication. Patient advised we do not prescribe pain medication. Patient advised that she will need to reach out to her PCP. Patient expressed understanding and hung up.

## 2023-01-29 ENCOUNTER — Other Ambulatory Visit: Payer: Self-pay

## 2023-01-30 ENCOUNTER — Other Ambulatory Visit: Payer: Self-pay

## 2023-03-13 ENCOUNTER — Other Ambulatory Visit: Payer: Self-pay

## 2023-03-19 ENCOUNTER — Other Ambulatory Visit (HOSPITAL_COMMUNITY): Payer: Self-pay

## 2023-03-23 ENCOUNTER — Other Ambulatory Visit: Payer: Self-pay

## 2023-04-02 ENCOUNTER — Other Ambulatory Visit: Payer: Self-pay

## 2023-04-20 NOTE — Progress Notes (Signed)
 Office Visit Note  Patient: Elizabeth Pope             Date of Birth: Dec 07, 1980           MRN: [id number]             PCP: Knox Royalty, MD Referring: Knox Royalty, MD Visit Date: 05/04/2023 Occupation: @GUAROCC @  Subjective:  Gap in therapy   History of Present Illness: Elizabeth Pope is a 43 y.o. female with history of seropositive rheumatoid arthritis and osteoarthritis.  Patient reports she has been off of simponi for the past 3 months.  Patient reports that her daughter was in and out of the hospital so she has been unable to have updated lab work or return office visit.  She states she currently is recovering from tonsillitis.  She recently received an IM injection of Rocephin and a steroid about 2 weeks ago but has had a persistent sore throat.  She will be following back up with her PCP.   Patient states she is having increased pain in both hips, both knees, and both feet since being off of simponi.  She is taking motrin as needed for pain relief.  She is taking Cymbalta 60 mg 1 capsule daily which she has found to be helpful.  Activities of Daily Living:  Patient reports morning stiffness for 20-30 minutes.   Patient Reports nocturnal pain.  Difficulty dressing/grooming: Denies Difficulty climbing stairs: Reports Difficulty getting out of chair: Reports Difficulty using hands for taps, buttons, cutlery, and/or writing: Reports  Review of Systems  Constitutional:  Positive for fatigue.  HENT:  Positive for sore throat, trouble swallowing, trouble swallowing and mouth dryness. Negative for mouth sores.   Eyes:  Positive for dryness. Negative for pain.  Respiratory:  Positive for difficulty breathing. Negative for shortness of breath.   Cardiovascular:  Negative for chest pain and palpitations.  Gastrointestinal:  Negative for blood in stool, constipation and diarrhea.  Endocrine: Negative for increased urination.  Genitourinary:  Negative for involuntary  urination.  Musculoskeletal:  Positive for joint pain, joint pain, joint swelling, myalgias, muscle weakness, morning stiffness and myalgias. Negative for gait problem and muscle tenderness.  Skin:  Negative for color change, rash, hair loss and sensitivity to sunlight.  Allergic/Immunologic: Positive for susceptible to infections.  Neurological:  Positive for dizziness and headaches.  Hematological:  Positive for swollen glands.  Psychiatric/Behavioral:  Positive for depressed mood and sleep disturbance. The patient is nervous/anxious.     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   Pelvic pain 06/02/2017   Atypical squamous cell changes of undetermined significance (ASCUS) on cervical cytology with negative high risk human papilloma virus (HPV) test result 02/03/2017   Encounter for IUD insertion 01/28/2017   Uterine fibroid 01/21/2017   H/O tubal ligation 06/24/2016   High risk medication use 01/07/2016   GERD (gastroesophageal reflux disease) 12/25/2015   Migraines 12/25/2015   Hypertension 12/25/2015   RA (rheumatoid arthritis) (HCC) 12/25/2015   DDD (degenerative disc disease), cervical 12/25/2015   Osteoarthritis of both hands 12/25/2015   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/2015   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   TB lung, latent 05/14/2015   Bipolar 2 disorder (HCC) 05/14/2015   Cigarette smoker 05/05/2015   Chest pain 05/05/2015   Solitary pulmonary nodule 05/04/2015    Past Medical History:  Diagnosis Date   Anxiety    Bipolar 1 disorder (HCC)    DDD (degenerative disc disease), cervical 12/25/2015   Depression    GERD (gastroesophageal reflux disease) 12/25/2015   Hypertension 12/25/2015   Migraine    Migraines 12/25/2015   OCD (obsessive compulsive disorder)    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/2015   mild    Osteoarthritis of both hands 12/25/2015   PTSD (post-traumatic stress disorder)    RA (rheumatoid arthritis) (HCC) 12/25/2015   Positive RF, Elevated ESR, Positive CCP > 250     Family History  Problem Relation Age of Onset   Rheum arthritis Mother    Migraines Mother    Hypertension Mother    Colitis Mother    Migraines Sister    Migraines Brother    Rheum arthritis Maternal Aunt    Rheum arthritis Maternal Grandmother    Sickle cell anemia Daughter    Bipolar disorder Daughter    Anxiety disorder Daughter    Depression Daughter    Migraines Daughter    Arrhythmia Daughter    Migraines Son    ADD / ADHD Son    Seizures Son    Arrhythmia Son    Past Surgical History:  Procedure Laterality Date   CESAREAN SECTION     IUD REMOVAL  09/26/2022   TUBAL LIGATION     Social History   Social History Narrative   Not on file   Immunization History  Administered Date(s) Administered   PFIZER(Purple Top)SARS-COV-2 Vaccination 01/05/2020, 01/26/2020     Objective: Vital Signs: BP (!) 134/90 (BP Location: Left Arm, Patient Position: Sitting, Cuff Size: Normal)   Pulse 80   Resp 17   Ht 5\' 3"  (1.6 m)   Wt 187 lb 6.4 oz (85 kg)   BMI 33.20 kg/m    Physical Exam Vitals and nursing note reviewed.  Constitutional:      Appearance: She is well-developed.  HENT:     Head: Normocephalic and atraumatic.  Eyes:     Conjunctiva/sclera: Conjunctivae normal.  Cardiovascular:     Rate and Rhythm: Normal rate and regular rhythm.     Heart sounds: Normal heart sounds.  Pulmonary:     Effort: Pulmonary effort is normal.     Breath sounds: Normal breath sounds.  Abdominal:     General: Bowel sounds are normal.     Palpations: Abdomen is soft.  Musculoskeletal:     Cervical back: Normal range of motion.  Skin:    General: Skin is warm and dry.     Capillary Refill: Capillary refill takes less than 2 seconds.  Neurological:     Mental Status: She is alert and oriented to  person, place, and time.  Psychiatric:        Behavior: Behavior normal.      Musculoskeletal Exam: C-spine has good ROM.  Painful and limited mobility of lumbar spine.  Right shoulder has full ROM.  Left shoulder has limited abduction.  Elbow joints, wrist joints, MCPs, PIPs, and DIPs good ROM with no synovitis.  Complete fist formation bilaterally.  Painful range of motion of both hips.  Knee joints have good ROM with no warmth or effusion.  Painful range of motion of both knee joints.  Tenderness of both ankles with mild inflammation in her right ankle.  Tenderness of MTP joints.  CDAI Exam: CDAI Score: -- Patient Global: --; Provider Global: -- Swollen: --; Tender: -- Joint Exam 05/04/2023   No joint exam has  been documented for this visit   There is currently no information documented on the homunculus. Go to the Rheumatology activity and complete the homunculus joint exam.  Investigation: No additional findings.  Imaging: No results found.  Recent Labs: Lab Results  Component Value Date   WBC 12.4 (H) 12/02/2022   HGB 13.1 12/02/2022   PLT 345 12/02/2022   NA 141 12/02/2022   K 3.8 12/02/2022   CL 107 12/02/2022   CO2 26 12/02/2022   GLUCOSE 85 12/02/2022   BUN 14 12/02/2022   CREATININE 0.93 12/02/2022   BILITOT 0.4 12/02/2022   ALKPHOS 43 03/24/2021   AST 11 12/02/2022   ALT 12 12/02/2022   PROT 7.4 12/02/2022   ALBUMIN 3.6 03/24/2021   CALCIUM 9.6 12/02/2022   GFRAA 90 07/03/2020   QFTBGOLDPLUS NEGATIVE 02/05/2022    Speciality Comments: PLQ Eye Exam: WNL 03/24/17 @ [facility],PLQ-diarrhea Prior therapy: Humira (facial rash) Humira & Enbrel (stopped due to waning response) - Orencia (injection site reaction) one dose on 02/19/21 -- Simponi started 03/06/21  Procedures:  No procedures performed Allergies: Amoxicillin-pot clavulanate, Cefuroxime, Doxycycline, Fluoxetine, Hydrocodone, Adalimumab, Hydrochlorothiazide, Hydrocodone-acetaminophen,  Hydroxychloroquine, Other, and Vicodin [hydrocodone-acetaminophen]   Assessment / Plan:     Visit Diagnoses: Rheumatoid arthritis involving multiple sites with positive rheumatoid factor [facility]): Patient presents today with increased pain and stiffness involving both hips, both knees, and both feet.  Patient has been off of Simponi injections for the past 3 months-due for updated lab work and an office visit.  She requested a refill of Simponi to be sent to the pharmacy today.   On examination she has painful range of motion of both hips and both knee joints.  Tenderness of both ankle joints with inflammation in the right ankle noted.  Tenderness of all MTP joints.  Patient requested a prednisone taper to help alleviate her current symptoms.  A prednisone taper starting 20 mg tapering by 5 mg every 4 days was sent to the pharmacy.  Instructions were provided including taking prednisone first thing in the morning with food and avoid any use of NSAIDs.  A new refill for Simponi was sent to the pharmacy today.  She is advised to notify us if she continues to have persistent joint pain and inflammation.  She will follow up in the office in 3 months or sooner if needed.   High risk medication use - She will be resuming Simponi 50 mg sq injections every month. CBC and CMP were drawn on 12/02/2022.  Orders for CBC and CMP were released today.  Her next Abrol be due in June and every 3 months to monitor for drug toxicity. TB Gold negative on 02/05/2022.  TB Gold ordered released today Cussed the importance of holding Simponi if she develops signs or symptoms of an infection and to resume once the infection is completely cleared.  She is currently recovering from tonsillitis.  She recently had a Rocephin injection as well as a IM steroid injection.  Patient was advised to continue to hold something until her symptoms have completely resolved.  She will be following back up with her PCP for further evaluation.- Plan:  CBC with Differential/Platelet, COMPLETE METABOLIC PANEL WITH GFR, QuantiFERON-TB Gold Plus  Screening for tuberculosis -Order for TB gold released today.  Plan: QuantiFERON-TB Gold Plus  Lipid screening: Patient had a recent lipid screening ordered by her PCP-we will call to obtain these records.   Chronic left shoulder pain - She had a left trapezius  trigger point injection performed on 06/25/2022 which provided temporary relief. Limited abduction to about 120 degrees.    Primary osteoarthritis of both hands: No tenderness or synovitis noted.  Complete fist formation.  Primary osteoarthritis of both knees: Good ROM of both knees with discomfort.  No effusion noted.   Chondromalacia of both patellae: She has good range of motion of both knee joints with discomfort bilaterally.   Primary osteoarthritis of both feet: Patient presents today with increased pain and stiffness involving both feet and ankle joints.  On examination today she has tenderness over all MCP joints as well as both ankles.  Inflammation was noted in the right ankle.  A prednisone taper was sent to the pharmacy.  She will be resuming Simponi as prescribed.  Trapezius muscle spasm: No muscle spasms recently.   DDD (degenerative disc disease), cervical: C-spine has good range of motion on examination today.  Fibromyalgia: She continues to experience intermittent myalgias and muscle tenderness due to fibromyalgia.  She is taking Cymbalta 60 mg daily which she has found to be helpful.  Other medical conditions are listed as follows:   Bipolar 2 disorder (HCC)  History of gastroesophageal reflux (GERD)  History of migraine  Essential hypertension: Blood pressure was 134/90 today in the office.  Former smoker - 1PPDx 15 years, quit smoking 01/23/17  Solitary pulmonary nodule  TB lung, latent    Orders: Orders Placed This Encounter  Procedures   CBC with Differential/Platelet   COMPLETE METABOLIC PANEL WITH GFR    QuantiFERON-TB Gold Plus   Meds ordered this encounter  Medications   predniSONE (DELTASONE) 5 MG tablet    Sig: Take 4 tabs po x 4 days, 3  tabs po x 4 days, 2  tabs po x 4 days, 1  tab po x 4 days    Dispense:  40 tablet    Refill:  0   Follow-Up Instructions: Return in about 3 months (around 08/04/2023) for Rheumatoid arthritis, Osteoarthritis.   Gearldine Bienenstock, PA-C  Note - This record has been created using Dragon software.  Chart creation errors have been sought, but may not always  have been located. Such creation errors do not reflect on  the standard of medical care.

## 2023-05-04 ENCOUNTER — Other Ambulatory Visit: Payer: Self-pay

## 2023-05-04 ENCOUNTER — Telehealth: Payer: Self-pay | Admitting: Pharmacist

## 2023-05-04 ENCOUNTER — Ambulatory Visit: Payer: 59 | Attending: Physician Assistant | Admitting: Physician Assistant

## 2023-05-04 ENCOUNTER — Encounter: Payer: Self-pay | Admitting: Physician Assistant

## 2023-05-04 VITALS — BP 134/90 | HR 80 | Resp 17 | Ht 63.0 in | Wt 187.4 lb

## 2023-05-04 DIAGNOSIS — M19072 Primary osteoarthritis, left ankle and foot: Secondary | ICD-10-CM

## 2023-05-04 DIAGNOSIS — F3181 Bipolar II disorder: Secondary | ICD-10-CM

## 2023-05-04 DIAGNOSIS — M170 Bilateral primary osteoarthritis of knee: Secondary | ICD-10-CM

## 2023-05-04 DIAGNOSIS — I10 Essential (primary) hypertension: Secondary | ICD-10-CM

## 2023-05-04 DIAGNOSIS — M25512 Pain in left shoulder: Secondary | ICD-10-CM

## 2023-05-04 DIAGNOSIS — M19041 Primary osteoarthritis, right hand: Secondary | ICD-10-CM | POA: Diagnosis not present

## 2023-05-04 DIAGNOSIS — Z8719 Personal history of other diseases of the digestive system: Secondary | ICD-10-CM

## 2023-05-04 DIAGNOSIS — M0579 Rheumatoid arthritis with rheumatoid factor of multiple sites without organ or systems involvement: Secondary | ICD-10-CM

## 2023-05-04 DIAGNOSIS — Z13220 Encounter for screening for lipoid disorders: Secondary | ICD-10-CM

## 2023-05-04 DIAGNOSIS — Z227 Latent tuberculosis: Secondary | ICD-10-CM

## 2023-05-04 DIAGNOSIS — M797 Fibromyalgia: Secondary | ICD-10-CM

## 2023-05-04 DIAGNOSIS — Z79899 Other long term (current) drug therapy: Secondary | ICD-10-CM | POA: Diagnosis not present

## 2023-05-04 DIAGNOSIS — M62838 Other muscle spasm: Secondary | ICD-10-CM

## 2023-05-04 DIAGNOSIS — M19042 Primary osteoarthritis, left hand: Secondary | ICD-10-CM

## 2023-05-04 DIAGNOSIS — G8929 Other chronic pain: Secondary | ICD-10-CM

## 2023-05-04 DIAGNOSIS — Z8669 Personal history of other diseases of the nervous system and sense organs: Secondary | ICD-10-CM

## 2023-05-04 DIAGNOSIS — Z111 Encounter for screening for respiratory tuberculosis: Secondary | ICD-10-CM

## 2023-05-04 DIAGNOSIS — M5030 Other cervical disc degeneration, unspecified cervical region: Secondary | ICD-10-CM

## 2023-05-04 DIAGNOSIS — M2242 Chondromalacia patellae, left knee: Secondary | ICD-10-CM

## 2023-05-04 DIAGNOSIS — R911 Solitary pulmonary nodule: Secondary | ICD-10-CM

## 2023-05-04 DIAGNOSIS — M19071 Primary osteoarthritis, right ankle and foot: Secondary | ICD-10-CM

## 2023-05-04 DIAGNOSIS — Z87891 Personal history of nicotine dependence: Secondary | ICD-10-CM

## 2023-05-04 DIAGNOSIS — M2241 Chondromalacia patellae, right knee: Secondary | ICD-10-CM

## 2023-05-04 MED ORDER — SIMPONI 50 MG/0.5ML ~~LOC~~ SOAJ
50.0000 mg | SUBCUTANEOUS | 0 refills | Status: DC
Start: 2023-05-04 — End: 2023-05-11
  Filled 2023-05-04: qty 1.5, 105d supply, fill #0

## 2023-05-04 MED ORDER — PREDNISONE 5 MG PO TABS: ORAL_TABLET | ORAL | 0 refills | Status: DC

## 2023-05-04 NOTE — Telephone Encounter (Signed)
 Pending OV note from today, please renew Simponi SQ prior auth.  Chesley Mires, PharmD, MPH, BCPS, CPP Clinical Pharmacist (Rheumatology and Pulmonology)

## 2023-05-04 NOTE — Telephone Encounter (Signed)
 Submitted an URGENT Prior Authorization request to [facility] for SIMPONI SQ via CoverMyMeds. Will update once we receive a response.  Key: [id number]   *NOTE -- notified by team member at [facility] that Rx was released from [employer] prematurely. Will need to set up in [facility] once onboarding has been completed.

## 2023-05-05 ENCOUNTER — Other Ambulatory Visit: Payer: Self-pay | Admitting: Obstetrics and Gynecology

## 2023-05-05 DIAGNOSIS — Z30430 Encounter for insertion of intrauterine contraceptive device: Secondary | ICD-10-CM

## 2023-05-05 NOTE — Progress Notes (Signed)
 CBC and CMP WNL

## 2023-05-05 NOTE — Telephone Encounter (Signed)
 Received a fax regarding Prior Authorization from [facility] for SIMPONI SQ. Authorization has been DENIED because:

## 2023-05-06 LAB — COMPLETE METABOLIC PANEL WITH GFR
AG Ratio: 1.3 (calc) (ref 1.0–2.5)
ALT: 14 U/L (ref 6–29)
AST: 11 U/L (ref 10–30)
Albumin: 4 g/dL (ref 3.6–5.1)
Alkaline phosphatase (APISO): 58 U/L (ref 31–125)
BUN: 16 mg/dL (ref 7–25)
CO2: 26 mmol/L (ref 20–32)
Calcium: 9.3 mg/dL (ref 8.6–10.2)
Chloride: 107 mmol/L (ref 98–110)
Creat: 0.97 mg/dL (ref 0.50–0.99)
Globulin: 3.2 g/dL (ref 1.9–3.7)
Glucose, Bld: 94 mg/dL (ref 65–99)
Potassium: 3.9 mmol/L (ref 3.5–5.3)
Sodium: 138 mmol/L (ref 135–146)
Total Bilirubin: 0.3 mg/dL (ref 0.2–1.2)
Total Protein: 7.2 g/dL (ref 6.1–8.1)
eGFR: 74 mL/min/{1.73_m2} (ref >=60)

## 2023-05-06 LAB — CBC WITH DIFFERENTIAL/PLATELET
Absolute Lymphocytes: 3762 {cells}/uL (ref 850–3900)
Absolute Monocytes: 733 {cells}/uL (ref 200–950)
Basophils Absolute: 59 {cells}/uL (ref 0–200)
Basophils Relative: 0.6 %
Eosinophils Absolute: 386 {cells}/uL (ref 15–500)
Eosinophils Relative: 3.9 %
HCT: 38.9 % (ref 35.0–45.0)
Hemoglobin: 13.2 g/dL (ref 11.7–15.5)
MCH: 29.7 pg (ref 27.0–33.0)
MCHC: 33.9 g/dL (ref 32.0–36.0)
MCV: 87.4 fL (ref 80.0–100.0)
MPV: 10.1 fL (ref 7.5–12.5)
Monocytes Relative: 7.4 %
Neutro Abs: 4960 {cells}/uL (ref 1500–7800)
Neutrophils Relative %: 50.1 %
Platelets: 338 10*3/uL (ref 140–400)
RBC: 4.45 10*6/uL (ref 3.80–5.10)
RDW: 14 % (ref 11.0–15.0)
Total Lymphocyte: 38 %
WBC: 9.9 10*3/uL (ref 3.8–10.8)

## 2023-05-06 LAB — QUANTIFERON-TB GOLD PLUS
Mitogen-NIL: 8.08 [IU]/mL
NIL: 0.06 [IU]/mL
QuantiFERON-TB Gold Plus: NEGATIVE
TB1-NIL: 0.01 [IU]/mL
TB2-NIL: 0.01 [IU]/mL

## 2023-05-06 NOTE — Progress Notes (Signed)
 TB gold negative

## 2023-05-07 NOTE — Telephone Encounter (Signed)
 Submitted an URGENT appeal to [facility] for SIMPONI SQ.  Reference # [id number] Phone: [phone] Fax: [phone]  Chesley Mires, PharmD, MPH, BCPS, CPP Clinical Pharmacist (Rheumatology and Pulmonology)

## 2023-05-08 ENCOUNTER — Other Ambulatory Visit: Payer: Self-pay

## 2023-05-11 ENCOUNTER — Other Ambulatory Visit: Payer: Self-pay

## 2023-05-11 ENCOUNTER — Other Ambulatory Visit (HOSPITAL_COMMUNITY): Payer: Self-pay

## 2023-05-11 MED ORDER — SIMPONI 50 MG/0.5ML ~~LOC~~ SOAJ
50.0000 mg | SUBCUTANEOUS | 2 refills | Status: DC
Start: 2023-05-11 — End: 2023-09-07
  Filled 2023-05-11: qty 0.5, 30d supply, fill #0
  Filled 2023-06-08: qty 0.5, 30d supply, fill #1
  Filled 2023-07-23: qty 0.5, 30d supply, fill #2

## 2023-05-11 NOTE — Progress Notes (Signed)
 Specialty Pharmacy Initial Fill Coordination Note  Elizabeth Pope is a 44 y.o. female contacted today regarding initial fill of specialty medication(s) Golimumab (Simponi)   Patient requested Delivery   Delivery date: 05/14/23   Verified address: [address]   [name] [id number]   Medication will be filled on 05/13/23.   Patient is aware of $0 copayment.

## 2023-05-11 NOTE — Telephone Encounter (Signed)
 Test claim for one Simponi pen processed for 30 day supply is processing successfully    [facility] notified. Patient should be able to get re-onboarded  Chesley Mires, PharmD, MPH, BCPS, CPP Clinical Pharmacist (Rheumatology and Pulmonology)

## 2023-05-13 ENCOUNTER — Other Ambulatory Visit: Payer: Self-pay

## 2023-05-14 ENCOUNTER — Other Ambulatory Visit (HOSPITAL_COMMUNITY): Payer: Self-pay

## 2023-05-14 NOTE — Telephone Encounter (Signed)
 Received approval letter from [facility] Medicaid. Simponi SQ is approved from 05/04/2023 through 02/24/2024  Auth # [id number]  Chesley Mires, PharmD, MPH, BCPS, CPP Clinical Pharmacist (Rheumatology and Pulmonology)

## 2023-05-18 ENCOUNTER — Emergency Department (HOSPITAL_COMMUNITY)
Admission: EM | Admit: 2023-05-18 | Discharge: 2023-05-18 | Disposition: A | Discharge status: Home / Self Care | Attending: Emergency Medicine | Admitting: Emergency Medicine

## 2023-05-18 ENCOUNTER — Emergency Department (HOSPITAL_COMMUNITY)

## 2023-05-18 ENCOUNTER — Other Ambulatory Visit: Payer: Self-pay

## 2023-05-18 DIAGNOSIS — Z87891 Personal history of nicotine dependence: Secondary | ICD-10-CM | POA: Diagnosis not present

## 2023-05-18 DIAGNOSIS — J039 Acute tonsillitis, unspecified: Secondary | ICD-10-CM | POA: Diagnosis not present

## 2023-05-18 DIAGNOSIS — J029 Acute pharyngitis, unspecified: Secondary | ICD-10-CM | POA: Diagnosis present

## 2023-05-18 DIAGNOSIS — D72829 Elevated white blood cell count, unspecified: Secondary | ICD-10-CM | POA: Diagnosis not present

## 2023-05-18 LAB — CBC WITH DIFFERENTIAL/PLATELET
Abs Immature Granulocytes: 0.05 10*3/uL (ref 0.00–0.07)
Basophils Absolute: 0.1 10*3/uL (ref 0.0–0.1)
Basophils Relative: 1 %
Eosinophils Absolute: 0.2 10*3/uL (ref 0.0–0.5)
Eosinophils Relative: 2 %
HCT: 39.6 % (ref 36.0–46.0)
Hemoglobin: 13.3 g/dL (ref 12.0–15.0)
Immature Granulocytes: 0 %
Lymphocytes Relative: 33 %
Lymphs Abs: 3.8 10*3/uL (ref 0.7–4.0)
MCH: 29.6 pg (ref 26.0–34.0)
MCHC: 33.6 g/dL (ref 30.0–36.0)
MCV: 88 fL (ref 80.0–100.0)
Monocytes Absolute: 0.8 10*3/uL (ref 0.1–1.0)
Monocytes Relative: 7 %
Neutro Abs: 6.6 10*3/uL (ref 1.7–7.7)
Neutrophils Relative %: 57 %
Platelets: 291 10*3/uL (ref 150–400)
RBC: 4.5 MIL/uL (ref 3.87–5.11)
RDW: 13.8 % (ref 11.5–15.5)
WBC: 11.6 10*3/uL — ABNORMAL HIGH (ref 4.0–10.5)
nRBC: 0 % (ref 0.0–0.2)

## 2023-05-18 LAB — BASIC METABOLIC PANEL
Anion gap: 8 (ref 5–15)
BUN: 15 mg/dL (ref 6–20)
CO2: 21 mmol/L — ABNORMAL LOW (ref 22–32)
Calcium: 9.4 mg/dL (ref 8.9–10.3)
Chloride: 109 mmol/L (ref 98–111)
Creatinine, Ser: 0.85 mg/dL (ref 0.44–1.00)
GFR, Estimated: >60 mL/min (ref >60)
Glucose, Bld: 88 mg/dL (ref 70–99)
Potassium: 4.3 mmol/L (ref 3.5–5.1)
Sodium: 138 mmol/L (ref 135–145)

## 2023-05-18 LAB — I-STAT CHEM 8, ED
BUN: 17 mg/dL (ref 6–20)
Calcium, Ion: 1.14 mmol/L — ABNORMAL LOW (ref 1.15–1.40)
Chloride: 109 mmol/L (ref 98–111)
Creatinine, Ser: 0.8 mg/dL (ref 0.44–1.00)
Glucose, Bld: 93 mg/dL (ref 70–99)
HCT: 37 % (ref 36.0–46.0)
Hemoglobin: 12.6 g/dL (ref 12.0–15.0)
Potassium: 4.4 mmol/L (ref 3.5–5.1)
Sodium: 140 mmol/L (ref 135–145)
TCO2: 24 mmol/L (ref 22–32)

## 2023-05-18 LAB — GROUP A STREP BY PCR: Group A Strep by PCR: NOT DETECTED

## 2023-05-18 MED ORDER — METHYLPREDNISOLONE SODIUM SUCC 125 MG IJ SOLR
125.0000 mg | Freq: Once | INTRAMUSCULAR | Status: AC
  Administered 2023-05-18: 125 mg via INTRAVENOUS
  Filled 2023-05-18: qty 2

## 2023-05-18 MED ORDER — ONDANSETRON HCL 4 MG/2ML IJ SOLN
4.0000 mg | Freq: Once | INTRAMUSCULAR | Status: AC
  Administered 2023-05-18: 4 mg via INTRAVENOUS
  Filled 2023-05-18: qty 2

## 2023-05-18 MED ORDER — MORPHINE SULFATE (PF) 2 MG/ML IV SOLN
2.0000 mg | Freq: Once | INTRAVENOUS | Status: AC
  Administered 2023-05-18: 2 mg via INTRAVENOUS
  Filled 2023-05-18: qty 1

## 2023-05-18 MED ORDER — PREDNISONE 20 MG PO TABS
40.0000 mg | ORAL_TABLET | Freq: Every day | ORAL | 0 refills | Status: AC
Start: 2023-05-18 — End: ?

## 2023-05-18 MED ORDER — IOHEXOL 300 MG/ML  SOLN
80.0000 mL | Freq: Once | INTRAMUSCULAR | Status: AC | PRN
  Administered 2023-05-18: 80 mL via INTRAVENOUS

## 2023-05-18 NOTE — ED Provider Notes (Signed)
 [place] EMERGENCY DEPARTMENT AT [facility] Provider Note   CSN: [id number] Arrival date & time: 05/18/23  1701     History {Add pertinent medical, surgical, social history, OB history to HPI:1} Chief Complaint  Patient presents with   Sore Throat    Elizabeth Pope is a 43 y.o. female past medical history of cigarette smoking, bipolar disorder, GERD, rheumatoid arthritis presenting to emergency room with complaint of 10 days of sore throat acutely worsened yesterday.  She reports she is having difficulty with phonation and handling secretions.  Locates pain to anterior neck and throat.  Reports she has been on antibiotics for presumed infection, but never tested positive for strep throat. 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   Denies any fever, chills, pain, shortness of breath, abdominal pain nausea vomiting diarrhea.   Sore Throat       Home Medications Prior to Admission medications   Medication Sig Start Date End Date Taking? Authorizing Provider  acetaminophen (TYLENOL) 325 MG tablet Take 650 mg by mouth every 6 (six) hours as needed for moderate pain.    [provider]  albuterol (VENTOLIN HFA) 108 (90 Base) MCG/ACT inhaler Inhale 1-2 puffs into the lungs every 6 (six) hours as needed for wheezing or shortness of breath. 12/11/18   Linus Mako B, NP  amLODipine (NORVASC) 10 MG tablet Take 10 mg by mouth daily. 01/14/21   [provider]  atorvastatin (LIPITOR) 40 MG tablet Take 40 mg by mouth daily.    [provider]  butalbital-acetaminophen-caffeine (FIORICET) 50-325-40 MG tablet Take by mouth 2 (two) times daily as needed for headache.    [provider]  cetirizine (ZYRTEC) 10 MG tablet Take 10 mg by mouth daily. 06/12/16   [provider]  diphenhydrAMINE (BENADRYL) 25 MG tablet Take 25 mg by mouth every 6 (six) hours as needed for itching.    [provider]  DULoxetine (CYMBALTA)  30 MG capsule Take 60 mg by mouth at bedtime. 12/30/22   [provider]  EPINEPHrine 0.3 mg/0.3 mL IJ SOAJ injection Inject 0.3 mg into the muscle once as needed for anaphylaxis. Patient not taking: Reported on 07/15/2022 10/27/19   [provider]  ergocalciferol (VITAMIN D2) 1.25 MG (50000 UT) capsule Take 50,000 Units by mouth once a week.    [provider]  esomeprazole (NEXIUM) 20 MG capsule 20 mg daily.    [provider]  Fluticasone-Salmeterol (ADVAIR) 100-50 MCG/DOSE AEPB Inhale 1 puff into the lungs 2 (two) times daily.    [provider]  Golimumab (SIMPONI) 50 MG/0.5ML SOAJ Inject 50 mg into the skin every 30 (thirty) days. 05/11/23   Gearldine Bienenstock, PA-C  hydrOXYzine (ATARAX/VISTARIL) 25 MG tablet Take 25 mg by mouth 3 (three) times daily as needed for anxiety or itching.    [provider]  ibuprofen (ADVIL) 800 MG tablet TAKE 1 TABLET(800 MG) BY MOUTH THREE TIMES DAILY WITH MEALS AS NEEDED FOR HEADACHE OR MODERATE PAIN OR CRAMPING 05/05/23   Warden Fillers, MD  losartan (COZAAR) 50 MG tablet Take 50 mg by mouth daily. 02/21/20   [provider]  misoprostol (CYTOTEC) 100 MCG tablet 1/2 tab per vagina the night before and the morning of the procedure Patient not taking: Reported on 05/04/2023 09/26/22   Warden Fillers, MD  montelukast (SINGULAIR) 10 MG tablet Take 10 mg by mouth daily.    [provider]  predniSONE (DELTASONE) 5 MG tablet  Take 4 tabs po x 4 days, 3  tabs po x 4 days, 2  tabs po x 4 days, 1  tab po x 4 days 05/04/23   Gearldine Bienenstock, PA-C  RESTASIS 0.05 % ophthalmic emulsion Place 1 drop into both eyes 2 (two) times daily. Patient not taking: Reported on 05/04/2023 09/03/20   [provider]  temazepam (RESTORIL) 15 MG capsule Take 15-30 mg by mouth at bedtime as needed. 01/01/23   [provider]  Abatacept (ORENCIA CLICKJECT) 125 MG/ML SOAJ Inject 125 mg into the skin once a week.  02/19/21 02/20/21  Pollyann Savoy, MD      Allergies    Amoxicillin-pot clavulanate, Cefuroxime, Doxycycline, Fluoxetine, Hydrocodone, Adalimumab, Hydrochlorothiazide, Hydrocodone-acetaminophen, Hydroxychloroquine, Other, and Vicodin [hydrocodone-acetaminophen]    Review of Systems   Review of Systems  HENT:  Positive for sore throat.     Physical Exam Updated Vital Signs BP (!) 147/105   Pulse 94   Temp 98.3 F (36.8 C) (Oral)   Resp 18   SpO2 100%  Physical Exam Vitals and nursing note reviewed.  Constitutional:      General: She is not in acute distress.    Appearance: She is not toxic-appearing.  HENT:     Head: Normocephalic and atraumatic.     Mouth/Throat:     Dentition: Normal dentition. No dental caries.     Pharynx: Uvula midline. Posterior oropharyngeal erythema present. No pharyngeal swelling, oropharyngeal exudate or uvula swelling.     Tonsils: No tonsillar exudate or tonsillar abscesses.  Eyes:     General: No scleral icterus.    Conjunctiva/sclera: Conjunctivae normal.  Cardiovascular:     Rate and Rhythm: Normal rate and regular rhythm.     Pulses: Normal pulses.     Heart sounds: Normal heart sounds.  Pulmonary:     Effort: Pulmonary effort is normal. No respiratory distress.     Breath sounds: Normal breath sounds.  Abdominal:     General: Abdomen is flat. Bowel sounds are normal.     Palpations: Abdomen is soft.     Tenderness: There is no abdominal tenderness.  Musculoskeletal:     Right lower leg: No edema.     Left lower leg: No edema.  Skin:    General: Skin is warm and dry.     Findings: No lesion.  Neurological:     General: No focal deficit present.     Mental Status: She is alert and oriented to person, place, and time. Mental status is at baseline.     ED Results / Procedures / Treatments   Labs (all labs ordered are listed, but only abnormal results are displayed) Labs Reviewed  CBC WITH DIFFERENTIAL/PLATELET - Abnormal;  Notable for the following components:      Result Value   WBC 11.6 (*)    All other components within normal limits  BASIC METABOLIC PANEL - Abnormal; Notable for the following components:   CO2 21 (*)    All other components within normal limits  I-STAT CHEM 8, ED - Abnormal; Notable for the following components:   Calcium, Ion 1.14 (*)    All other components within normal limits  GROUP A STREP BY PCR    EKG None  Radiology CT Soft Tissue Neck W Contrast Result Date: 05/18/2023 CLINICAL DATA:  Soft tissue infection suspected, neck, xray done Tech note: has sore throat, swollen tonsils- states she cannot eat or drink, also unable to swallow secretions EXAM: CT NECK  WITH CONTRAST TECHNIQUE: Multidetector CT imaging of the neck was performed using the standard protocol following the bolus administration of intravenous contrast. RADIATION DOSE REDUCTION: This exam was performed according to the departmental dose-optimization program which includes automated exposure control, adjustment of the mA and/or kV according to patient size and/or use of iterative reconstruction technique. CONTRAST:  80mL OMNIPAQUE IOHEXOL 300 MG/ML  SOLN COMPARISON:  None Available. FINDINGS: Pharynx and larynx: Enlarged and mildly edematous tonsils. No discrete, drainable fluid collection. Salivary glands: No inflammation, mass, or stone. Thyroid: Normal. Lymph nodes: None enlarged or abnormal density. Vascular: Negative. Limited intracranial: Negative. Visualized orbits: Negative. Mastoids and visualized paranasal sinuses: Right frontal, ethmoid and maxillary sinus mucosal thickening. No mastoid effusions. Skeleton: No acute abnormality on limited assessment. Upper chest: Visualized lung apices are clear. IMPRESSION: Enlarged and mildly edematous tonsils, compatible with tonsillitis given the reported clinical history. No discrete, drainable fluid collection. Electronically Signed   By: Feliberto Harts M.D.   On:  05/18/2023 21:16    Procedures Procedures  {Document cardiac monitor, telemetry assessment procedure when appropriate:1}  Medications Ordered in ED Medications  morphine (PF) 2 MG/ML injection 2 mg (has no administration in time range)  ondansetron (ZOFRAN) injection 4 mg (has no administration in time range)  methylPREDNISolone sodium succinate (SOLU-MEDROL) 125 mg/2 mL injection 125 mg (125 mg Intravenous Given 05/18/23 1845)  iohexol (OMNIPAQUE) 300 MG/ML solution 80 mL (80 mLs Intravenous Contrast Given 05/18/23 1900)    ED Course/ Medical Decision Making/ A&P   {   Click here for ABCD2, HEART and other calculatorsREFRESH Note before signing :1}                              Medical Decision Making Amount and/or Complexity of Data Reviewed Labs: ordered. Radiology: ordered.  Risk Prescription drug management.   This patient presents to the ED for concern of sore throat, this involves an extensive number of treatment options, and is a complaint that carries with it a high risk of complications and morbidity.  The differential diagnosis includes strep, URI, ludwig, peritonsillar abscess, retropharyngeal abscess   Lab Tests:  I personally interpreted labs.  The pertinent results include:   CBC with mild leukocytosis and no anemia.  BMP without significant anemia.  Normal kidney function.  Strep throat negative   Imaging Studies ordered:  I ordered imaging studies including Ct soft tissue  I independently visualized and interpreted imaging which showed mildly enlarged tonsils consistent with tonsillitis. No drain able fluid or abscess.  I agree with the radiologist interpretation    Problem List / ED Course / Critical interventions / Medication management  *** Tolerating oral intake, reports symptoms have significantly improved, reports she is felling better and would like to be discharged home. Patient is stable and well appearing, will have her follow up with PCP.  I  ordered medication including morphine, zofran for pain, solumedrol for inflammation  Reevaluation of the patient after these medicines showed that the patient improved I have reviewed the patients home medicines and have made adjustments as needed   Plan  F/u w/ PCP in 2-3d to ensure resolution of sx.  Patient was given return precautions. Patient stable for discharge at this time.  Patient educated on sx/dx and verbalized understanding of plan. Return to ER w/ new or worsening sx.    {Document critical care time when appropriate:1} {Document review of labs and clinical decision tools ie heart  score, Chads2Vasc2 etc:1}  {Document your independent review of radiology images, and any outside records:1} {Document your discussion with family members, caretakers, and with consultants:1} {Document social determinants of health affecting pt's care:1} {Document your decision making why or why not admission, treatments were needed:1} Final Clinical Impression(s) / ED Diagnoses Final diagnoses:  None    Rx / DC Orders ED Discharge Orders     None

## 2023-05-18 NOTE — ED Triage Notes (Signed)
 Pt has sore throat, swollen tonsils- states she cannot eat or drink, also unable to swallow secretions

## 2023-05-18 NOTE — Discharge Instructions (Addendum)
 I have sent medications to your pharmacy please take as prescribed.  Make sure you are staying well-hydrated with water you can alternate Tylenol 1000mg  every 6 hours and ibuprofen as well.  Use viscous lidocaine to swish and spit. Try warm tea and honey and lemon. Use cough drops. Return with new or worsening symptoms.

## 2023-05-27 ENCOUNTER — Other Ambulatory Visit: Payer: Self-pay

## 2023-06-08 ENCOUNTER — Other Ambulatory Visit: Payer: Self-pay

## 2023-06-08 NOTE — Progress Notes (Signed)
 Specialty Pharmacy Refill Coordination Note  Elizabeth Pope is a 43 y.o. female contacted today regarding refills of specialty medication(s) Golimumab (Simponi)   Patient requested Delivery   Delivery date: 06/24/23   Verified address: [address]   [place] [address]   Medication will be filled on 06/23/23.

## 2023-06-23 ENCOUNTER — Other Ambulatory Visit: Payer: Self-pay

## 2023-07-21 ENCOUNTER — Other Ambulatory Visit: Payer: Self-pay

## 2023-07-22 NOTE — Progress Notes (Unsigned)
 Office Visit Note  Patient: Elizabeth Pope             Date of Birth: 10/26/1980           MRN: [id number]             PCP: Trellis Fries, MD Referring: Trellis Fries, MD Visit Date: 08/05/2023 Occupation: @GUAROCC @  Subjective:  Lower back pain   History of Present Illness: Elizabeth Pope is a 43 y.o. female with history of seropositive rheumatoid arthritis and osteoarthritis.  Patient remains on Simponi  50 mg sq injections every month. She is tolerating simponi  without any side effects or injection site reactions.  Patient continues to experience frequent flares of fibromyalgia which she attributes to being under increased stress.  She experiences significant myofascial pain and fatigue during flares.  She has also had ongoing discomfort in her lower back.  According to the patient with certain positions her back will lock up where she requires assistance to fully straighten her back out again.  Patient states she is also noticed increased fluid retention in both lower extremities if she is sitting for longer than 30 minutes.   Activities of Daily Living:  Patient reports morning stiffness for 1-2 hours.   Patient Reports nocturnal pain.  Difficulty dressing/grooming: Reports Difficulty climbing stairs: Reports Difficulty getting out of chair: Reports Difficulty using hands for taps, buttons, cutlery, and/or writing: Reports  Review of Systems  Constitutional:  Positive for fatigue.  HENT:  Positive for mouth dryness. Negative for mouth sores.   Eyes:  Positive for dryness.  Respiratory:  Negative for shortness of breath.   Cardiovascular:  Positive for palpitations. Negative for chest pain.  Gastrointestinal:  Negative for blood in stool, constipation and diarrhea.  Endocrine: Positive for increased urination.  Genitourinary:  Negative for involuntary urination.  Musculoskeletal:  Positive for joint pain, gait problem, joint pain, joint swelling, myalgias, muscle  weakness, morning stiffness, muscle tenderness and myalgias.  Skin:  Positive for rash. Negative for color change, hair loss and sensitivity to sunlight.  Allergic/Immunologic: Positive for susceptible to infections.  Neurological:  Positive for dizziness and headaches.  Hematological:  Negative for swollen glands.  Psychiatric/Behavioral:  Positive for depressed mood and sleep disturbance. The patient is nervous/anxious.     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   Pelvic pain 06/02/2017   Atypical squamous cell changes of undetermined significance (ASCUS) on cervical cytology with negative high risk human papilloma virus (HPV) test result 02/03/2017   Encounter for IUD insertion 01/28/2017   Uterine fibroid 01/21/2017   H/O tubal ligation 06/24/2016   High risk medication use 01/07/2016   GERD (gastroesophageal reflux disease) 12/25/2015   Migraines 12/25/2015   Hypertension 12/25/2015   RA (rheumatoid arthritis) (HCC) 12/25/2015   DDD (degenerative disc disease), cervical 12/25/2015   Osteoarthritis of both hands 12/25/2015   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/2015   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   TB lung, latent 05/14/2015   Bipolar 2 disorder (HCC) 05/14/2015   Cigarette smoker 05/05/2015   Chest pain 05/05/2015   Solitary pulmonary nodule 05/04/2015    Past Medical History:  Diagnosis Date   Anxiety    Bipolar 1 disorder (HCC)    DDD (degenerative disc disease), cervical 12/25/2015   Depression    GERD (gastroesophageal reflux disease) 12/25/2015   Hypertension 12/25/2015  Migraine    Migraines 12/25/2015   OCD (obsessive compulsive disorder)    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/2015   mild   Osteoarthritis of both hands 12/25/2015   PTSD (post-traumatic stress disorder)    RA (rheumatoid arthritis)  (HCC) 12/25/2015   Positive RF, Elevated ESR, Positive CCP > 250     Family History  Problem Relation Age of Onset   Rheum arthritis Mother    Migraines Mother    Hypertension Mother    Colitis Mother    Migraines Sister    Migraines Brother    Rheum arthritis Maternal Aunt    Rheum arthritis Maternal Grandmother    Sickle cell anemia Daughter    Bipolar disorder Daughter    Anxiety disorder Daughter    Depression Daughter    Migraines Daughter    Arrhythmia Daughter    Migraines Son    ADD / ADHD Son    Seizures Son    Arrhythmia Son    Past Surgical History:  Procedure Laterality Date   CESAREAN SECTION     IUD REMOVAL  09/26/2022   TUBAL LIGATION     Social History   Social History Narrative   Not on file   Immunization History  Administered Date(s) Administered   PFIZER(Purple Top)SARS-COV-2 Vaccination 01/05/2020, 01/26/2020     Objective: Vital Signs: BP 119/81 (BP Location: Left Arm, Patient Position: Sitting, Cuff Size: Normal)   Pulse 97   Resp 16   Ht 5' 4 (1.626 m)   Wt 183 lb 12.8 oz (83.4 kg)   BMI 31.55 kg/m    Physical Exam Vitals and nursing note reviewed.  Constitutional:      Appearance: She is well-developed.  HENT:     Head: Normocephalic and atraumatic.  Eyes:     Conjunctiva/sclera: Conjunctivae normal.  Cardiovascular:     Rate and Rhythm: Normal rate and regular rhythm.     Heart sounds: Normal heart sounds.  Pulmonary:     Effort: Pulmonary effort is normal.     Breath sounds: Normal breath sounds.  Abdominal:     General: Bowel sounds are normal.     Palpations: Abdomen is soft.  Musculoskeletal:     Cervical back: Normal range of motion.  Lymphadenopathy:     Cervical: No cervical adenopathy.  Skin:    General: Skin is warm and dry.     Capillary Refill: Capillary refill takes less than 2 seconds.  Neurological:     Mental Status: She is alert and oriented to person, place, and time.  Psychiatric:        Behavior:  Behavior normal.      Musculoskeletal Exam: C-spine has good range of motion with some discomfort with lateral rotation to the left.  Painful limited mobility of the lumbar spine.  Trapezius muscle tension tenderness bilaterally, left greater than right.  Left shoulder has limited abduction to about 120 degrees.  Right shoulder has full range of motion.  Elbow joints, wrist joints, MCPs, PIPs, DIPs have good range of motion with no synovitis.  Complete fist formation bilaterally.  Difficult to assess hip range of motion while in seated position.  Knee joints have good range of motion with no warmth or effusion.  Ankle joints have good range of motion with no tenderness.  CDAI Exam: CDAI Score: -- Patient Global: --; Provider Global: -- Swollen: --; Tender: -- Joint Exam 08/05/2023   No joint exam has been documented for this visit   There is  currently no information documented on the homunculus. Go to the Rheumatology activity and complete the homunculus joint exam.  Investigation: No additional findings.  Imaging: No results found.  Recent Labs: Lab Results  Component Value Date   WBC 11.6 (H) 05/18/2023   HGB 12.6 05/18/2023   PLT 291 05/18/2023   NA 140 05/18/2023   K 4.4 05/18/2023   CL 109 05/18/2023   CO2 21 (L) 05/18/2023   GLUCOSE 93 05/18/2023   BUN 17 05/18/2023   CREATININE 0.80 05/18/2023   BILITOT 0.3 05/04/2023   ALKPHOS 43 03/24/2021   AST 11 05/04/2023   ALT 14 05/04/2023   PROT 7.2 05/04/2023   ALBUMIN 3.6 03/24/2021   CALCIUM 9.4 05/18/2023   GFRAA 90 07/03/2020   QFTBGOLDPLUS NEGATIVE 05/04/2023    Speciality Comments: PLQ Eye Exam: WNL 03/24/17 @ [facility],PLQ-diarrhea Prior therapy: Humira  (facial rash) Humira  & Enbrel  (stopped due to waning response) - Orencia  (injection site reaction) one dose on 02/19/21 -- Simponi  started 03/06/21  Procedures:  No procedures performed Allergies: Amoxicillin-pot clavulanate, Cefuroxime, Doxycycline ,  Fluoxetine, Hydrocodone , Lidocaine , Adalimumab , Hydrochlorothiazide, Hydrocodone -acetaminophen , Hydroxychloroquine, Other, and Vicodin [hydrocodone -acetaminophen ]   Assessment / Plan:     Visit Diagnoses: Rheumatoid arthritis involving multiple sites with positive rheumatoid factor (HCC): No synovitis was noted on examination today.  She has not had any signs or symptoms of a recent rheumatoid arthritis flare.  She has clinically been doing well on Simponi  50 mg subcutaneous injections once monthly.  She is tolerating Simponi  without any side effects or injection site reactions.  No recent gaps in therapy.  Most of her symptoms seem to be due to underlying fibromyalgia.  She has had significant flares causing myofascial pain and fatigue which she attributes to being under increased stress.  She remains on Cymbalta 60 mg daily.  Discussed that she may benefit from water aerobics or water therapy. She will remain on Simponi  as prescribed.  She was vies notify us  if she develops signs or symptoms of a flare.  She will follow-up in the office in 5 months or sooner if needed.  High risk medication use - Simponi  50 mg sq injections every month. CMP  updated on 07/24/23. CBC with diff updated today.  Lipid panel updated on 07/24/23. TB gold negative on 05/04/23. No recurrent infections. Discussed the importance of holding simponi  if she develops signs or symptoms of an infection and to resume once the infection has completely cleared.   - Plan: CBC with Differential/Platelet  Chronic left shoulder pain: Limited abduction to 120 degrees.  Primary osteoarthritis of both hands: Complete fist formation bilaterally.  No tenderness or synovitis.  Primary osteoarthritis of both knees: She has good range of motion of both knee joints on examination today.  No warmth or effusion noted.  Chondromalacia of both patellae: She has good range of motion of both knee joints on examination today.  Primary osteoarthritis  of both feet: No joint inflammation noted.  She has noticed increased fluid retention in bilateral lower extremities after sitting for up to 30 minutes at a time.  Discussed the use of compression stockings.  Recommended follow-up with PCP if she continues to have persistent fluid retention.   Trapezius muscle spasm: She has trapezius muscle tension and tenderness bilaterally, left greater than right.  DDD (degenerative disc disease), cervical: C-spine has good range of motion with some discomfort on rotation to the left.  Trapezius muscle tension and tenderness noted bilaterally.  Chronic midline low back pain without  sciatica: Previously evaluated by Dr. Erlene Hawks office visit note from 02/10/22-X-rays no fracture or dislocation.  No evidence of instability on flexion and extension views.  No significant degenerative changes noted. Patient continues to persistent discomfort in her lower back.  At times her back will lock up to the point she needs assistance to standup straight.  Encouraged the patient to follow back up with Dr. Sulema Endo for further evaluation.   Fibromyalgia: Patient has generalized myalgias and muscle tenderness due to fibromyalgia.  She has been experiencing more frequent flares of fibromyalgia which she attributes to being under increased rest.  During flares she experiences severe myofascial pain and fatigue.  She has remained on Cymbalta 60 mg daily.  Other medical conditions are listed as follows:   Bipolar 2 disorder (HCC)  History of gastroesophageal reflux (GERD)  History of migraine  Essential hypertension: Blood pressure was 119/81 today in the office.  Former smoker  Solitary pulmonary nodule  TB lung, latent  Orders: Orders Placed This Encounter  Procedures   CBC with Differential/Platelet   No orders of the defined types were placed in this encounter.    Follow-Up Instructions: Return in about 5 months (around 01/05/2024) for Rheumatoid  arthritis, Osteoarthritis.   Romayne Clubs, PA-C  Note - This record has been created using Dragon software.  Chart creation errors have been sought, but may not always  have been located. Such creation errors do not reflect on  the standard of medical care.

## 2023-07-23 ENCOUNTER — Other Ambulatory Visit: Payer: Self-pay | Admitting: Pharmacy Technician

## 2023-07-23 ENCOUNTER — Other Ambulatory Visit: Payer: Self-pay

## 2023-07-23 NOTE — Progress Notes (Signed)
 Specialty Pharmacy Refill Coordination Note  Elizabeth Pope is a 43 y.o. female contacted today regarding refills of specialty medication(s) Golimumab  (Simponi )   Patient requested Delivery   Delivery date: 07/30/23   Verified address: [address] [place], [place] [address]   Medication will be filled on 07/29/23.

## 2023-08-05 ENCOUNTER — Encounter: Payer: Self-pay | Admitting: Physician Assistant

## 2023-08-05 ENCOUNTER — Ambulatory Visit: Attending: Physician Assistant | Admitting: Physician Assistant

## 2023-08-05 VITALS — BP 119/81 | HR 97 | Resp 16 | Ht 64.0 in | Wt 183.8 lb

## 2023-08-05 DIAGNOSIS — M19041 Primary osteoarthritis, right hand: Secondary | ICD-10-CM | POA: Diagnosis not present

## 2023-08-05 DIAGNOSIS — Z79899 Other long term (current) drug therapy: Secondary | ICD-10-CM | POA: Diagnosis not present

## 2023-08-05 DIAGNOSIS — Z227 Latent tuberculosis: Secondary | ICD-10-CM

## 2023-08-05 DIAGNOSIS — F3181 Bipolar II disorder: Secondary | ICD-10-CM

## 2023-08-05 DIAGNOSIS — M2241 Chondromalacia patellae, right knee: Secondary | ICD-10-CM

## 2023-08-05 DIAGNOSIS — M5030 Other cervical disc degeneration, unspecified cervical region: Secondary | ICD-10-CM

## 2023-08-05 DIAGNOSIS — M0579 Rheumatoid arthritis with rheumatoid factor of multiple sites without organ or systems involvement: Secondary | ICD-10-CM | POA: Diagnosis not present

## 2023-08-05 DIAGNOSIS — M545 Low back pain, unspecified: Secondary | ICD-10-CM

## 2023-08-05 DIAGNOSIS — Z8719 Personal history of other diseases of the digestive system: Secondary | ICD-10-CM

## 2023-08-05 DIAGNOSIS — M25512 Pain in left shoulder: Secondary | ICD-10-CM

## 2023-08-05 DIAGNOSIS — I10 Essential (primary) hypertension: Secondary | ICD-10-CM

## 2023-08-05 DIAGNOSIS — R911 Solitary pulmonary nodule: Secondary | ICD-10-CM

## 2023-08-05 DIAGNOSIS — Z87891 Personal history of nicotine dependence: Secondary | ICD-10-CM

## 2023-08-05 DIAGNOSIS — M170 Bilateral primary osteoarthritis of knee: Secondary | ICD-10-CM

## 2023-08-05 DIAGNOSIS — M19071 Primary osteoarthritis, right ankle and foot: Secondary | ICD-10-CM

## 2023-08-05 DIAGNOSIS — M62838 Other muscle spasm: Secondary | ICD-10-CM

## 2023-08-05 DIAGNOSIS — M797 Fibromyalgia: Secondary | ICD-10-CM

## 2023-08-05 DIAGNOSIS — G8929 Other chronic pain: Secondary | ICD-10-CM

## 2023-08-05 DIAGNOSIS — M19042 Primary osteoarthritis, left hand: Secondary | ICD-10-CM

## 2023-08-05 DIAGNOSIS — M19072 Primary osteoarthritis, left ankle and foot: Secondary | ICD-10-CM

## 2023-08-05 DIAGNOSIS — M2242 Chondromalacia patellae, left knee: Secondary | ICD-10-CM

## 2023-08-05 DIAGNOSIS — Z8669 Personal history of other diseases of the nervous system and sense organs: Secondary | ICD-10-CM

## 2023-08-05 LAB — CBC WITH DIFFERENTIAL/PLATELET
Absolute Lymphocytes: 3941 {cells}/uL — ABNORMAL HIGH (ref 850–3900)
Absolute Monocytes: 447 {cells}/uL (ref 200–950)
Basophils Absolute: 43 {cells}/uL (ref 0–200)
Basophils Relative: 0.6 %
Eosinophils Absolute: 376 {cells}/uL (ref 15–500)
Eosinophils Relative: 5.3 %
HCT: 39.7 % (ref 35.0–45.0)
Hemoglobin: 13.3 g/dL (ref 11.7–15.5)
MCH: 29.2 pg (ref 27.0–33.0)
MCHC: 33.5 g/dL (ref 32.0–36.0)
MCV: 87.3 fL (ref 80.0–100.0)
MPV: 9.3 fL (ref 7.5–12.5)
Monocytes Relative: 6.3 %
Neutro Abs: 2293 {cells}/uL (ref 1500–7800)
Neutrophils Relative %: 32.3 %
Platelets: 371 10*3/uL (ref 140–400)
RBC: 4.55 10*6/uL (ref 3.80–5.10)
RDW: 13 % (ref 11.0–15.0)
Total Lymphocyte: 55.5 %
WBC: 7.1 10*3/uL (ref 3.8–10.8)

## 2023-08-05 NOTE — Patient Instructions (Signed)

## 2023-08-06 ENCOUNTER — Ambulatory Visit: Payer: Self-pay | Admitting: Physician Assistant

## 2023-08-06 NOTE — Progress Notes (Signed)
 Absolute lymphocytes are elevated. Rest of CBC WNL.  We will continue to monitor.

## 2023-08-12 ENCOUNTER — Ambulatory Visit (INDEPENDENT_AMBULATORY_CARE_PROVIDER_SITE_OTHER): Admitting: Orthopedic Surgery

## 2023-08-12 ENCOUNTER — Other Ambulatory Visit (INDEPENDENT_AMBULATORY_CARE_PROVIDER_SITE_OTHER)

## 2023-08-12 DIAGNOSIS — M545 Low back pain, unspecified: Secondary | ICD-10-CM

## 2023-08-12 MED ORDER — CYCLOBENZAPRINE HCL 10 MG PO TABS: 10.0000 mg | ORAL_TABLET | Freq: Three times a day (TID) | ORAL | 1 refills | Status: DC | PRN

## 2023-08-12 NOTE — Progress Notes (Signed)
 Orthopedic Spine Surgery Office Note  Assessment: Patient is a 43 y.o. female with low back pain and muscle spasms.  Possible etiologies are the paraspinal muscles or the SI joints (more for her pain based on exam)   Plan: -Explained that initially conservative treatment is tried as a significant number of patients may experience relief with these treatment modalities. Discussed that the conservative treatments include:  -activity modification  -physical therapy  -over the counter pain medications  -medrol  dosepak  -steroid injections -Patient has tried no specific treatments for this problem -Recommended a muscle relaxer so prescribed her Flexeril .  She can use Tylenol  up to 1000 mg 3 times daily. -If she does not get relief with the Flexeril , could consider another muscle relaxer or SI joint injections as a diagnostic -Patient should return to office on an as-needed basis   Patient expressed understanding of the plan and all questions were answered to the patient's satisfaction.   ___________________________________________________________________________   History:  Patient is a 43 y.o. female who presents today for lumbar spine.  Patient reports a long history of lower back pain that has been present for over a year.  She reported has gotten progressively worse.  She said she notices back spasms in her lower back.  She says she feels them mostly with weightbearing or being active.  She also notices it with flexion of the lumbar spine.  She does not notice them as much when she is sitting.  She has the pain in the lower lumbar spine.  She has no pain radiating into either lower extremity.   Weakness: Denies Symptoms of imbalance: Yes, sometimes feels off balance but no consistent unsteadiness or imbalance Paresthesias and numbness: Denies Bowel or bladder incontinence: Denies Saddle anesthesia: Denies  Treatments tried: none of late  Review of systems: Denies fevers and  chills, night sweats, unexplained weight loss, history of cancer, pain that wakes them at night  Past medical history: Bipolar GERD Migraines RA OCD Fibromyalgia  Allergies: amoxicillin, cefuroxime, doxycycline , fluoxetine, hydrocodone , lidocaine , adalimumab , hydrochlorothiazide, hydrocodone , hydroxychloroquine  Past surgical history:  C section  Tubal ligation  Social history: Denies use of nicotine product (smoking, vaping, patches, smokeless) Alcohol use: rare Denies recreational drug use   Physical Exam:  General: no acute distress, appears stated age Neurologic: alert, answering questions appropriately, following commands Respiratory: unlabored breathing on room air, symmetric chest rise Psychiatric: appropriate affect, normal cadence to speech   MSK (spine):  -Strength exam      Left  Right EHL    5/5  5/5 TA    5/5  5/5 GSC    5/5  5/5 Knee extension  5/5  5/5 Hip flexion   5/5  5/5  -Sensory exam    Sensation intact to light touch in L3-S1 nerve distributions of bilateral lower extremities  -Achilles DTR: 1/4 on the left, 1/4 on the right -Patellar tendon DTR: 1/4 on the left, 1/4 on the right  -Straight leg raise: Negative bilaterally -Clonus: no beats bilaterally  -Left hip exam: Positive Faber, positive SI joint compression test, positive Gaenslen's, no pain to hip range of motion -Right hip exam: Positive Faber, positive SI joint compression test, positive Gaenslen's, no pain to hip range of motion  - TTP with superficial palpation over the lower lumbar paraspinal muscles  Imaging: XRs of the lumbar spine from 08/12/2023 were independently reviewed and interpreted, showing slight amount of disc height loss at L5/S1 but no other significant degenerative changes seen.  No evidence  of instability on flexion/extension views.  No fracture or dislocation seen.   Patient name: Elizabeth Pope Patient MRN: [id number] Date of visit: 08/12/23

## 2023-08-27 ENCOUNTER — Other Ambulatory Visit: Payer: Self-pay | Admitting: Obstetrics and Gynecology

## 2023-08-27 DIAGNOSIS — Z30430 Encounter for insertion of intrauterine contraceptive device: Secondary | ICD-10-CM

## 2023-09-07 ENCOUNTER — Other Ambulatory Visit: Payer: Self-pay | Admitting: Physician Assistant

## 2023-09-07 ENCOUNTER — Other Ambulatory Visit: Payer: Self-pay

## 2023-09-07 DIAGNOSIS — Z79899 Other long term (current) drug therapy: Secondary | ICD-10-CM

## 2023-09-07 DIAGNOSIS — M0579 Rheumatoid arthritis with rheumatoid factor of multiple sites without organ or systems involvement: Secondary | ICD-10-CM

## 2023-09-07 MED ORDER — SIMPONI 50 MG/0.5ML ~~LOC~~ SOAJ
50.0000 mg | SUBCUTANEOUS | 2 refills | Status: DC
Start: 2023-09-07 — End: 2024-01-05
  Filled 2023-09-07 – 2023-09-11 (×2): qty 0.5, 30d supply, fill #0
  Filled 2023-10-02: qty 0.5, 30d supply, fill #1
  Filled 2023-11-06: qty 0.5, 30d supply, fill #2

## 2023-09-07 NOTE — Telephone Encounter (Signed)
 Last Fill: 05/11/2023  Labs: per office note CMP updated on 07/24/23. 08/05/2023 bsolute lymphocytes are elevated. Rest of CBC WNL.   TB Gold: 05/04/2023 Neg    Next Visit: 01/05/2024  Last Visit: 08/05/2023  IK:Myzlfjunpi arthritis involving multiple sites with positive rheumatoid factor   Current Dose per office note 08/05/2023: Simponi  50 mg sq injections every month.   Okay to refill Simponi ?

## 2023-09-11 ENCOUNTER — Other Ambulatory Visit (HOSPITAL_COMMUNITY): Payer: Self-pay

## 2023-09-11 ENCOUNTER — Other Ambulatory Visit: Payer: Self-pay

## 2023-09-11 NOTE — Progress Notes (Signed)
 Specialty Pharmacy Refill Coordination Note  MyChart Questionnaire Submission  Elizabeth Pope is a 43 y.o. female contacted today regarding refills of specialty medication(s) Simponi .  Injection date: (Patient-Rptd) 09/24/23  Doses on hand: (Patient-Rptd) 0   Patient requested: Delivery   Delivery date: 09/15/23   Verified address: (Patient-Rptd) [address] [place] [address]  Medication will be filled on 09/14/23.

## 2023-09-14 ENCOUNTER — Other Ambulatory Visit: Payer: Self-pay

## 2023-09-14 NOTE — Progress Notes (Signed)
 Clinical Intervention Note  Clinical Intervention Notes: Patient reports starting probiotics, no DDIs were identified with her Simponi .   Clinical Intervention Outcomes: Prevention of an adverse drug event   Silvano LOISE Blair Karel Santa

## 2023-09-30 ENCOUNTER — Encounter: Payer: Self-pay | Admitting: Orthopedic Surgery

## 2023-09-30 MED ORDER — METHOCARBAMOL 500 MG PO TABS: 500.0000 mg | ORAL_TABLET | Freq: Four times a day (QID) | ORAL | 0 refills | Status: DC | PRN

## 2023-10-02 ENCOUNTER — Other Ambulatory Visit: Payer: Self-pay

## 2023-10-02 ENCOUNTER — Encounter (INDEPENDENT_AMBULATORY_CARE_PROVIDER_SITE_OTHER): Payer: Self-pay

## 2023-10-02 NOTE — Progress Notes (Signed)
 Clinical Intervention Note  Clinical Intervention Notes: Patient reported increasing doses of Duloxetine and Temazepam and starting digestive enzymes - conducted DDI and no interactions found with Simponi . Patient reported not documenting injection dates; stated she takes injection within a few day to a week after receiving the delivery. She disclosed she traveled over the last few months and did not bring her medication with her. Endorsed increased hip and knee pain, feels that Simponi  is not working as well as when she started it. Advised patient to call office before next appointment in November - patient will contact office to discuss. Notifying clinical pharmacist. Provided counseling on documenting dose administration and importance of a consistent dosing schedule.   Clinical Intervention Outcomes: Improved therapy adherence; Prevention of an adverse drug event   [name] Specialty Pharmacist

## 2023-10-02 NOTE — Progress Notes (Signed)
 Specialty Pharmacy Ongoing Clinical Assessment Note  Elizabeth Pope is a 43 y.o. female who is being followed by the specialty pharmacy service for RxSp Rheumatoid Arthritis   Patient's specialty medication(s) reviewed today: Golimumab  (Simponi )   Missed doses in the last 4 weeks: 0   Patient/Caregiver did not have any additional questions or concerns.   Therapeutic benefit summary: Patient is achieving benefit (Patient still achieving benefit while not as effective as inially.)   Adverse events/side effects summary: No adverse events/side effects   Patient's therapy is appropriate to: Continue    Goals Addressed             This Visit's Progress    Minimize recurrence of flares       Patient is not on track and worsening. Patient will work on increased adherence and be monitored by provider to determine if a change in treatment plan is warranted         Follow up: 12 months  [name] Specialty Pharmacist

## 2023-10-02 NOTE — Progress Notes (Signed)
 Specialty Pharmacy Refill Coordination Note  Elizabeth Pope is a 43 y.o. female contacted today regarding refills of specialty medication(s) Golimumab  (Simponi )   Patient requested (Patient-Rptd) Delivery   Delivery date: 10/13/23   Verified address: (Patient-Rptd) [address] [place] [address]   Medication will be filled on 10/12/2023.

## 2023-10-12 ENCOUNTER — Other Ambulatory Visit: Payer: Self-pay

## 2023-10-13 ENCOUNTER — Other Ambulatory Visit: Payer: Self-pay | Admitting: *Deleted

## 2023-10-13 MED ORDER — PREDNISONE 5 MG PO TABS: ORAL_TABLET | ORAL | 0 refills | Status: AC

## 2023-10-13 NOTE — Telephone Encounter (Signed)
 Patient advised we will send in a prescription for prednisone  starting at 20 mg tapering by 5 mg every 4 days.  Patient advised to take prednisone  in the morning with food and avoid the use of NSAIDs. Patient advised f her pelvic pain does not improve-recommend evaluation by orthopedics. Patient expressed understanding.

## 2023-10-13 NOTE — Addendum Note (Signed)
 Addended by: CENA ALFONSO CROME on: 10/13/2023 12:47 PM   Modules accepted: Orders

## 2023-10-13 NOTE — Telephone Encounter (Signed)
 Patient contacted the office stating she is having a flare which start 1-2 weeks ago. Patient states the flare is in all of her joints. Patient states she is having a pain in her pelvic bone. She states the pain is not constant but more like a shooting pain orjolt of electricity. Patient states she is having swelling in her knees. Patient states she has been taking Ibuprofen  with no relief. Patient states her next dose of Simponi  is due 10/16/2023. Patient is requesting a prescription for prednisone . Please advise.

## 2023-10-13 NOTE — Telephone Encounter (Signed)
 Ok to send in prednisone  starting at 20 mg tapering by 5 mg every 4 days. Take prednisone  in the morning with food and avoid the use of NSAIDs.  If her pelvic pain does not improve-recommend evaluation by orthopedics

## 2023-11-06 ENCOUNTER — Other Ambulatory Visit: Payer: Self-pay

## 2023-11-06 ENCOUNTER — Other Ambulatory Visit: Payer: Self-pay | Admitting: Pharmacy Technician

## 2023-11-06 NOTE — Progress Notes (Signed)
 Specialty Pharmacy Refill Coordination Note  Elizabeth Pope is a 43 y.o. female contacted today regarding refills of specialty medication(s) Golimumab  (Simponi )   Patient requested Delivery   Delivery date: 11/12/23   Verified address: [address]  [place]   Medication will be filled on 11/11/23.

## 2023-11-11 ENCOUNTER — Other Ambulatory Visit: Payer: Self-pay

## 2023-11-20 ENCOUNTER — Encounter: Payer: Self-pay | Admitting: Orthopedic Surgery

## 2023-11-20 DIAGNOSIS — M545 Low back pain, unspecified: Secondary | ICD-10-CM

## 2023-11-20 MED ORDER — TRAMADOL HCL 50 MG PO TABS: 50.0000 mg | ORAL_TABLET | Freq: Four times a day (QID) | ORAL | 0 refills | Status: AC | PRN

## 2023-12-03 ENCOUNTER — Other Ambulatory Visit: Payer: Self-pay

## 2023-12-03 ENCOUNTER — Other Ambulatory Visit: Payer: Self-pay | Admitting: Physician Assistant

## 2023-12-03 DIAGNOSIS — M0579 Rheumatoid arthritis with rheumatoid factor of multiple sites without organ or systems involvement: Secondary | ICD-10-CM

## 2023-12-03 DIAGNOSIS — Z79899 Other long term (current) drug therapy: Secondary | ICD-10-CM

## 2023-12-03 NOTE — Telephone Encounter (Signed)
 Last Fill: 09/07/2023  Labs: 08/05/2023 Absolute lymphocytes are elevated. Rest of CBC WNL. We will continue to monitor.   05/18/2023 BMP CO2 21  Per office note on 08/05/2023, CMP updated on 07/24/23.   TB Gold: 05/04/2023  TB gold negative  Next Visit: 01/05/2024  Last Visit: 08/05/2023  DX: Rheumatoid arthritis involving multiple sites with positive rheumatoid factor (HCC)   Current Dose per office note 08/05/2023: Simponi  50 mg sq injections every month.   Attempted to contact the patient and left a message advising labs are due and our lab hours.   Okay to refill Simponi ?

## 2023-12-08 ENCOUNTER — Other Ambulatory Visit (HOSPITAL_COMMUNITY): Payer: Self-pay

## 2023-12-15 ENCOUNTER — Other Ambulatory Visit: Payer: Self-pay | Admitting: Obstetrics and Gynecology

## 2023-12-15 DIAGNOSIS — Z30430 Encounter for insertion of intrauterine contraceptive device: Secondary | ICD-10-CM

## 2023-12-17 ENCOUNTER — Other Ambulatory Visit: Payer: Self-pay | Admitting: Orthopedic Surgery

## 2023-12-22 NOTE — Progress Notes (Unsigned)
 Office Visit Note  Patient: Elizabeth Pope             Date of Birth: 07-27-1980           MRN: [id number]             PCP: Joshua Francisco, MD Referring: Joshua Francisco, MD Visit Date: 01/05/2024 Occupation: Data Unavailable  Subjective:  Pain in multiple joints   History of Present Illness: Elizabeth Pope is a 43 y.o. female with history of seropositive rheumatoid arthritis and osteoarthritis.  Patient remains on Simponi  50 mg sq injections every month.  Patient reports that she was due for her last dose of Simponi  on 12/18/2023 but has been overdue for a refill.  Patient states that she requires updated lab work today.  Patient would like a refill of Simponi  to be sent to the pharmacy today.  Patient states that she is currently having a flare affecting the left wrist, left hand, both knees, and both ankle joints.  Patient states that she has had swelling in the left wrist.  Patient has been taking ibuprofen  more frequently for pain relief.  Patient states that she is scheduled to establish care with pain management this week to discuss treatment alternatives to better manage her pain levels.  She is currently having a fibromyalgia flare causing generalized myofascial pain. She denies any recent or recurrent infections.      Activities of Daily Living:  Patient reports morning stiffness for 1 hour.   Patient Reports nocturnal pain.  Difficulty dressing/grooming: Reports Difficulty climbing stairs: Reports Difficulty getting out of chair: Reports Difficulty using hands for taps, buttons, cutlery, and/or writing: Reports  Review of Systems  Constitutional:  Positive for fatigue.  HENT:  Positive for mouth dryness. Negative for mouth sores.   Eyes:  Positive for dryness.  Respiratory:  Negative for shortness of breath.   Cardiovascular:  Negative for chest pain and palpitations.  Gastrointestinal:  Negative for blood in stool, constipation and diarrhea.  Endocrine:  Negative for increased urination.  Genitourinary:  Positive for painful urination and nocturia. Negative for involuntary urination.  Musculoskeletal:  Positive for joint pain, gait problem, joint pain, joint swelling, myalgias, muscle weakness, morning stiffness, muscle tenderness and myalgias.  Skin:  Positive for rash. Negative for color change, hair loss and sensitivity to sunlight.  Allergic/Immunologic: Negative for susceptible to infections.  Neurological:  Positive for dizziness, numbness, headaches and parasthesias.  Hematological:  Negative for swollen glands.  Psychiatric/Behavioral:  Positive for depressed mood and sleep disturbance. The patient is nervous/anxious.     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   Pelvic pain 06/02/2017   Atypical squamous cell changes of undetermined significance (ASCUS) on cervical cytology with negative high risk human papilloma virus (HPV) test result 02/03/2017   Encounter for IUD insertion 01/28/2017   Uterine fibroid 01/21/2017   H/O tubal ligation 06/24/2016   High risk medication use 01/07/2016   GERD (gastroesophageal reflux disease) 12/25/2015   Migraines 12/25/2015   Hypertension 12/25/2015   RA (rheumatoid arthritis) (HCC) 12/25/2015   DDD (degenerative disc disease), cervical 12/25/2015   Osteoarthritis of both hands 12/25/2015   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/2015   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   TB lung, latent 05/14/2015   Bipolar 2 disorder (HCC) 05/14/2015   Cigarette smoker 05/05/2015   Chest  pain 05/05/2015   Solitary pulmonary nodule 05/04/2015    Past Medical History:  Diagnosis Date   Anxiety    Bipolar 1 disorder (HCC)    DDD (degenerative disc disease), cervical 12/25/2015   Depression    GERD (gastroesophageal reflux disease) 12/25/2015    Hypertension 12/25/2015   Migraine    Migraines 12/25/2015   OCD (obsessive compulsive disorder)    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/2015   mild   Osteoarthritis of both hands 12/25/2015   PTSD (post-traumatic stress disorder)    RA (rheumatoid arthritis) (HCC) 12/25/2015   Positive RF, Elevated ESR, Positive CCP > 250     Family History  Problem Relation Age of Onset   Rheum arthritis Mother    Migraines Mother    Hypertension Mother    Colitis Mother    Migraines Sister    Migraines Brother    Rheum arthritis Maternal Aunt    Rheum arthritis Maternal Grandmother    Sickle cell anemia Daughter    Bipolar disorder Daughter    Anxiety disorder Daughter    Depression Daughter    Migraines Daughter    Arrhythmia Daughter    Migraines Son    ADD / ADHD Son    Seizures Son    Arrhythmia Son    Past Surgical History:  Procedure Laterality Date   CESAREAN SECTION     IUD REMOVAL  09/26/2022   TUBAL LIGATION     Social History   Tobacco Use   Smoking status: Former    Current packs/day: 0.50    Average packs/day: 0.5 packs/day for 19.0 years (9.5 ttl pk-yrs)    Types: Cigars, Cigarettes    Passive exposure: Past   Smokeless tobacco: Never   Tobacco comments:    former cigarette smoker  Vaping Use   Vaping status: Some Days  Substance Use Topics   Alcohol use: Yes    Comment: rarely   Drug use: No   Social History   Social History Narrative   Not on file     Immunization History  Administered Date(s) Administered   PFIZER(Purple Top)SARS-COV-2 Vaccination 01/05/2020, 01/26/2020     Objective: Vital Signs: BP (!) 127/90   Pulse 79   Temp 97.8 F (36.6 C)   Resp 15   Ht 5' 4 (1.626 m)   Wt 183 lb 3.2 oz (83.1 kg)   BMI 31.45 kg/m    Physical Exam Vitals and nursing note reviewed.  Constitutional:      Appearance: She is well-developed.  HENT:     Head: Normocephalic and atraumatic.  Eyes:     Conjunctiva/sclera: Conjunctivae normal.   Cardiovascular:     Rate and Rhythm: Normal rate and regular rhythm.     Heart sounds: Normal heart sounds.  Pulmonary:     Effort: Pulmonary effort is normal.     Breath sounds: Normal breath sounds.  Abdominal:     General: Bowel sounds are normal.     Palpations: Abdomen is soft.  Musculoskeletal:     Cervical back: Normal range of motion.  Lymphadenopathy:     Cervical: No cervical adenopathy.  Skin:    General: Skin is warm and dry.     Capillary Refill: Capillary refill takes less than 2 seconds.  Neurological:     Mental Status: She is alert and oriented to person, place, and time.  Psychiatric:        Behavior: Behavior normal.      Musculoskeletal Exam: Generalized hyperalgesia  and positive tender points on exam.  C-spine has limited range of motion with lateral rotation especially to the left.  Left trapezius muscle tension and tenderness noted.  Painful range of motion of the left shoulder.  Tenderness of the left wrist.  Tenderness of the left 4th and 5th MCP joints.  Painful range of motion of both knees with fullness of the right knee noted.  Tenderness send inflammation noted in both ankles, right worse than left.   CDAI Exam: CDAI Score: -- Patient Global: --; Provider Global: -- Swollen: --; Tender: -- Joint Exam 01/05/2024   No joint exam has been documented for this visit   There is currently no information documented on the homunculus. Go to the Rheumatology activity and complete the homunculus joint exam.  Investigation: No additional findings.  Imaging: No results found.  Recent Labs: Lab Results  Component Value Date   WBC 7.1 08/05/2023   HGB 13.3 08/05/2023   PLT 371 08/05/2023   NA 140 05/18/2023   K 4.4 05/18/2023   CL 109 05/18/2023   CO2 21 (L) 05/18/2023   GLUCOSE 93 05/18/2023   BUN 17 05/18/2023   CREATININE 0.80 05/18/2023   BILITOT 0.3 05/04/2023   ALKPHOS 43 03/24/2021   AST 11 05/04/2023   ALT 14 05/04/2023   PROT 7.2  05/04/2023   ALBUMIN 3.6 03/24/2021   CALCIUM 9.4 05/18/2023   GFRAA 90 07/03/2020   QFTBGOLDPLUS NEGATIVE 05/04/2023    Speciality Comments: PLQ Eye Exam: WNL 03/24/17 @ [facility],PLQ-diarrhea Prior therapy: Humira  (facial rash) Humira  & Enbrel  (stopped due to waning response) - Orencia  (injection site reaction) one dose on 02/19/21 -- Simponi  started 03/06/21  Procedures:  No procedures performed Allergies: Amoxicillin-pot clavulanate, Cefuroxime, Doxycycline , Fluoxetine, Hydrocodone , Lidocaine , Adalimumab , Hydrochlorothiazide, Hydrocodone -acetaminophen , Hydroxychloroquine, Other, and Vicodin [hydrocodone -acetaminophen ]   Assessment / Plan:     Visit Diagnoses: Rheumatoid arthritis involving multiple sites with positive rheumatoid factor (HCC) - Patient presents today experiencing a rheumatoid arthritis and fibromyalgia flare.  Patient was due for her last dose of Simponi  on 12/18/2023 but will require refill.  She is overdue to update lab work so orders for CBC and CMP were released today.  She is been taking ibuprofen  on a daily basis for symptomatic relief.  She is establishing care with pain management later this week to discuss treatment alternatives since her pain levels have been inadequately controlled. Refill of Simponi  50 mg sq injections once monthly was sent to the pharmacy today.  Encouraged the patient to remain up-to-date with lab work to prevent any gaps in therapy. Plan to send in prednisone  20 mg tapering by 5 mg every 4 days.  Encouraged the patient to avoid NSAID use while taking prednisone .  She plans on establishing care with pain management this week to discuss pain management options. She will follow up in 5 months or sooner if needed.   Plan: Golimumab  (SIMPONI ) 50 MG/0.5ML SOAJ  High risk medication use - Simponi  50 mg sq injections every month.  CBC and CMP orders released today.  TB gold negative on 05/04/23 No recent or recurrent infections. Discussed the  importance of holding simponi  if she develops signs or symptoms of an infection and to resume once the infection has completely cleared.  - Plan: CBC with Differential/Platelet, Comprehensive metabolic panel with GFR, Golimumab  (SIMPONI ) 50 MG/0.5ML SOAJ  Chronic left shoulder pain: Painful limited range of motion of the left shoulder.  Primary osteoarthritis of both hands: She has discomfort extension of the  left wrist.  Tenderness of the left wrist noted.  She also has tenderness of the left 4th and 5th MCP and PIP joints.  A prednisone  taper sent to the pharmacy today as requested.  Primary osteoarthritis of both knees: Patient has painful range of motion of both knees.  She also difficulty rising from a seated position and climbing steps due to the discomfort.  On examination she has fullness of the right knee with mild crepitus bilaterally.  A prednisone  taper was sent to the pharmacy today.   Chondromalacia of both patellae: Painful ROM of both knees with mild crepitus.  Fullness of the right knee noted.  Plan to send in a prednisone  taper today.   Primary osteoarthritis of both feet: Chronic pain involving both ankles.  Tenderness and mild inflammation affecting both ankles.  Plan to send in a prednisone  taper.   Trapezius muscle spasm: Left worse than right. Declined a trigger point injection at this time. She will notify us  if her symptoms persist or worsen.   DDD (degenerative disc disease), cervical - Previously evaluated by Dr. Luella no fracture or dislocation.  No evidence of instability on flexion and extension views. Chronic pain.  Limited ROM with lateral rotation especially to the left. She will be establishing care with pain management this week.   Fibromyalgia -She presents today experiencing a fibromyalgia flare.  She has generalized hyperalgesia and positive tender points on exam.  She has left trapezius muscle tension and tenderness.  She remains on Cymbalta 60 mg  daily.  She is been taking ibuprofen  for pain relief.  Patient will be establishing care with pain management this week to discuss treatment alternatives for better management of her pain levels.   Other medical conditions are listed as follows:  Bipolar 2 disorder (HCC)  History of gastroesophageal reflux (GERD)  History of migraine  Essential hypertension: Blood pressure was slightly elevated at 127/90 today in the office.  Former smoker  Solitary pulmonary nodule  TB lung, latent  Orders: Orders Placed This Encounter  Procedures   CBC with Differential/Platelet   Comprehensive metabolic panel with GFR   Meds ordered this encounter  Medications   Golimumab  (SIMPONI ) 50 MG/0.5ML SOAJ    Sig: Inject 50 mg into the skin every 30 (thirty) days.    Dispense:  0.5 mL    Refill:  2   predniSONE  (DELTASONE ) 5 MG tablet    Sig: Take 4 tabs po x 4 days, 3  tabs po x 4 days, 2  tabs po x 4 days, 1  tab po x 4 days    Dispense:  40 tablet    Refill:  0     Follow-Up Instructions: Return in about 5 months (around 06/04/2024) for Rheumatoid arthritis.   Waddell CHRISTELLA Craze, PA-C  Note - This record has been created using Dragon software.  Chart creation errors have been sought, but may not always  have been located. Such creation errors do not reflect on  the standard of medical care.

## 2023-12-28 ENCOUNTER — Encounter: Payer: Self-pay | Admitting: Radiology

## 2024-01-05 ENCOUNTER — Other Ambulatory Visit: Payer: Self-pay

## 2024-01-05 ENCOUNTER — Ambulatory Visit: Attending: Physician Assistant | Admitting: Physician Assistant

## 2024-01-05 ENCOUNTER — Encounter: Payer: Self-pay | Admitting: Physician Assistant

## 2024-01-05 VITALS — BP 127/90 | HR 79 | Temp 97.8°F | Resp 15 | Ht 64.0 in | Wt 183.2 lb

## 2024-01-05 DIAGNOSIS — I10 Essential (primary) hypertension: Secondary | ICD-10-CM

## 2024-01-05 DIAGNOSIS — Z79899 Other long term (current) drug therapy: Secondary | ICD-10-CM | POA: Diagnosis not present

## 2024-01-05 DIAGNOSIS — Z8719 Personal history of other diseases of the digestive system: Secondary | ICD-10-CM

## 2024-01-05 DIAGNOSIS — M62838 Other muscle spasm: Secondary | ICD-10-CM

## 2024-01-05 DIAGNOSIS — M0579 Rheumatoid arthritis with rheumatoid factor of multiple sites without organ or systems involvement: Secondary | ICD-10-CM

## 2024-01-05 DIAGNOSIS — M19041 Primary osteoarthritis, right hand: Secondary | ICD-10-CM | POA: Diagnosis not present

## 2024-01-05 DIAGNOSIS — M19072 Primary osteoarthritis, left ankle and foot: Secondary | ICD-10-CM

## 2024-01-05 DIAGNOSIS — M170 Bilateral primary osteoarthritis of knee: Secondary | ICD-10-CM

## 2024-01-05 DIAGNOSIS — Z8669 Personal history of other diseases of the nervous system and sense organs: Secondary | ICD-10-CM

## 2024-01-05 DIAGNOSIS — M2241 Chondromalacia patellae, right knee: Secondary | ICD-10-CM

## 2024-01-05 DIAGNOSIS — G8929 Other chronic pain: Secondary | ICD-10-CM

## 2024-01-05 DIAGNOSIS — R911 Solitary pulmonary nodule: Secondary | ICD-10-CM

## 2024-01-05 DIAGNOSIS — Z87891 Personal history of nicotine dependence: Secondary | ICD-10-CM

## 2024-01-05 DIAGNOSIS — M25512 Pain in left shoulder: Secondary | ICD-10-CM

## 2024-01-05 DIAGNOSIS — M5030 Other cervical disc degeneration, unspecified cervical region: Secondary | ICD-10-CM

## 2024-01-05 DIAGNOSIS — M2242 Chondromalacia patellae, left knee: Secondary | ICD-10-CM

## 2024-01-05 DIAGNOSIS — M797 Fibromyalgia: Secondary | ICD-10-CM

## 2024-01-05 DIAGNOSIS — M19042 Primary osteoarthritis, left hand: Secondary | ICD-10-CM

## 2024-01-05 DIAGNOSIS — Z227 Latent tuberculosis: Secondary | ICD-10-CM

## 2024-01-05 DIAGNOSIS — M19071 Primary osteoarthritis, right ankle and foot: Secondary | ICD-10-CM

## 2024-01-05 DIAGNOSIS — F3181 Bipolar II disorder: Secondary | ICD-10-CM

## 2024-01-05 MED ORDER — SIMPONI 50 MG/0.5ML ~~LOC~~ SOAJ
50.0000 mg | SUBCUTANEOUS | 2 refills | Status: DC
  Filled 2024-01-05 (×2): qty 0.5, 30d supply, fill #0
  Filled 2024-01-28: qty 0.5, 30d supply, fill #1
  Filled 2024-02-26: qty 0.5, 30d supply, fill #2

## 2024-01-05 MED ORDER — PREDNISONE 5 MG PO TABS: ORAL_TABLET | ORAL | 0 refills | Status: AC

## 2024-01-05 NOTE — Progress Notes (Signed)
 Specialty Pharmacy Refill Coordination Note  Elizabeth Pope is a 43 y.o. female contacted today regarding refills of specialty medication(s) Golimumab  (Simponi )   Patient requested Delivery   Delivery date: 01/06/24   Verified address: [address]  [place]   Medication will be filled on: 01/05/24

## 2024-01-06 ENCOUNTER — Ambulatory Visit: Payer: Self-pay | Admitting: Physician Assistant

## 2024-01-06 LAB — CBC WITH DIFFERENTIAL/PLATELET
Absolute Lymphocytes: 3008 {cells}/uL (ref 850–3900)
Absolute Monocytes: 380 {cells}/uL (ref 200–950)
Basophils Absolute: 28 {cells}/uL (ref 0–200)
Basophils Relative: 0.4 %
Eosinophils Absolute: 304 {cells}/uL (ref 15–500)
Eosinophils Relative: 4.4 %
HCT: 40.2 % (ref 35.0–45.0)
Hemoglobin: 13.6 g/dL (ref 11.7–15.5)
MCH: 29.4 pg (ref 27.0–33.0)
MCHC: 33.8 g/dL (ref 32.0–36.0)
MCV: 87 fL (ref 80.0–100.0)
MPV: 9.9 fL (ref 7.5–12.5)
Monocytes Relative: 5.5 %
Neutro Abs: 3181 {cells}/uL (ref 1500–7800)
Neutrophils Relative %: 46.1 %
Platelets: 344 Thousand/uL (ref 140–400)
RBC: 4.62 Million/uL (ref 3.80–5.10)
RDW: 14.2 % (ref 11.0–15.0)
Total Lymphocyte: 43.6 %
WBC: 6.9 Thousand/uL (ref 3.8–10.8)

## 2024-01-06 LAB — COMPREHENSIVE METABOLIC PANEL WITH GFR
AG Ratio: 1.2 (calc) (ref 1.0–2.5)
ALT: 16 U/L (ref 6–29)
AST: 15 U/L (ref 10–30)
Albumin: 4.2 g/dL (ref 3.6–5.1)
Alkaline phosphatase (APISO): 45 U/L (ref 31–125)
BUN: 13 mg/dL (ref 7–25)
CO2: 23 mmol/L (ref 20–32)
Calcium: 9.5 mg/dL (ref 8.6–10.2)
Chloride: 107 mmol/L (ref 98–110)
Creat: 0.92 mg/dL (ref 0.50–0.99)
Globulin: 3.6 g/dL (ref 1.9–3.7)
Glucose, Bld: 121 mg/dL — ABNORMAL HIGH (ref 65–99)
Potassium: 4 mmol/L (ref 3.5–5.3)
Sodium: 137 mmol/L (ref 135–146)
Total Bilirubin: 0.3 mg/dL (ref 0.2–1.2)
Total Protein: 7.8 g/dL (ref 6.1–8.1)
eGFR: 79 mL/min/1.73m2 (ref >=60)

## 2024-01-06 NOTE — Progress Notes (Signed)
 Glucose is 121, rest of CMP WNL CBC WNL

## 2024-01-28 ENCOUNTER — Other Ambulatory Visit (HOSPITAL_COMMUNITY): Payer: Self-pay

## 2024-02-01 ENCOUNTER — Other Ambulatory Visit (HOSPITAL_COMMUNITY): Payer: Self-pay

## 2024-02-03 ENCOUNTER — Other Ambulatory Visit: Payer: Self-pay

## 2024-02-03 NOTE — Progress Notes (Signed)
 Specialty Pharmacy Refill Coordination Note  Elizabeth Pope is a 43 y.o. female contacted today regarding refills of specialty medication(s) Golimumab  (Simponi )   Patient requested Delivery   Delivery date: 02/05/24   Verified address: [address]  [place]   Medication will be filled on: 02/04/24

## 2024-02-04 ENCOUNTER — Other Ambulatory Visit: Payer: Self-pay

## 2024-02-26 ENCOUNTER — Other Ambulatory Visit: Payer: Self-pay

## 2024-02-29 ENCOUNTER — Other Ambulatory Visit: Payer: Self-pay

## 2024-02-29 NOTE — Progress Notes (Signed)
 Specialty Pharmacy Refill Coordination Note  Sharan Mcenaney is a 44 y.o. female contacted today regarding refills of specialty medication(s) Golimumab  (Simponi )   Patient requested Delivery   Delivery date: 03/04/24   Verified address: [address]  [place] [address]   Medication will be filled on: 03/03/24

## 2024-02-29 NOTE — Progress Notes (Signed)
 Copay changed to $12.65, patient aware and provided updated payment information.

## 2024-03-01 MED ORDER — PREDNISONE 5 MG PO TABS: ORAL_TABLET | ORAL | 0 refills | Status: AC

## 2024-03-01 NOTE — Telephone Encounter (Signed)
Ok to continue gabapentin?

## 2024-03-01 NOTE — Telephone Encounter (Signed)
 Ok to send in prednisone 20 mg tapering by 5 mg every 4 days.  Take in the morning with food and avoid the use of NSAIDs.

## 2024-03-03 ENCOUNTER — Other Ambulatory Visit: Payer: Self-pay

## 2024-03-15 ENCOUNTER — Other Ambulatory Visit: Payer: Self-pay | Admitting: Orthopedic Surgery

## 2024-03-17 ENCOUNTER — Encounter: Payer: Self-pay | Admitting: Rheumatology

## 2024-03-17 ENCOUNTER — Encounter: Payer: Self-pay | Admitting: Orthopedic Surgery

## 2024-03-17 DIAGNOSIS — M545 Low back pain, unspecified: Secondary | ICD-10-CM

## 2024-03-25 ENCOUNTER — Other Ambulatory Visit: Payer: Self-pay | Admitting: Physician Assistant

## 2024-03-25 ENCOUNTER — Other Ambulatory Visit: Payer: Self-pay

## 2024-03-25 DIAGNOSIS — Z79899 Other long term (current) drug therapy: Secondary | ICD-10-CM

## 2024-03-25 DIAGNOSIS — M0579 Rheumatoid arthritis with rheumatoid factor of multiple sites without organ or systems involvement: Secondary | ICD-10-CM

## 2024-03-25 MED ORDER — SIMPONI 50 MG/0.5ML ~~LOC~~ SOAJ
50.0000 mg | SUBCUTANEOUS | 2 refills | Status: AC
  Filled 2024-03-25 – 2024-03-28 (×2): qty 0.5, 30d supply, fill #0

## 2024-03-25 NOTE — Telephone Encounter (Signed)
 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   Okay to refill Simponi ?

## 2024-03-28 ENCOUNTER — Other Ambulatory Visit: Payer: Self-pay

## 2024-03-28 NOTE — Progress Notes (Signed)
 Clinical Intervention Note  Clinical Intervention Notes: Patient reported starting meloxicam . No DDIs identfied with Simponi    Clinical Intervention Outcomes: Prevention of an adverse drug event   [employer]

## 2024-03-28 NOTE — Progress Notes (Signed)
 Specialty Pharmacy Refill Coordination Note  Elizabeth Pope is a 44 y.o. female contacted today regarding refills of specialty medication(s) Golimumab  (Simponi )   Patient requested Delivery   Delivery date: 04/01/24   Verified address: [address] [place] [address]   Medication will be filled on: 03/31/24

## 2024-03-31 ENCOUNTER — Other Ambulatory Visit: Payer: Self-pay

## 2024-05-12 ENCOUNTER — Ambulatory Visit: Admitting: Orthopedic Surgery

## 2024-06-08 ENCOUNTER — Ambulatory Visit: Admitting: Rheumatology
